# Patient Record
Sex: Male | Born: 1960 | Race: White | Hispanic: No | Marital: Married | Smoking: Former smoker
Health system: Southern US, Community
[De-identification: ages and names within clinical notes are randomized; demographics above are authoritative.]

## PROBLEM LIST (undated history)

## (undated) DIAGNOSIS — C439 Malignant melanoma of skin, unspecified: Principal | ICD-10-CM

## (undated) DIAGNOSIS — F17200 Nicotine dependence, unspecified, uncomplicated: Secondary | ICD-10-CM

## (undated) DIAGNOSIS — R7989 Other specified abnormal findings of blood chemistry: Secondary | ICD-10-CM

## (undated) DIAGNOSIS — F329 Major depressive disorder, single episode, unspecified: Secondary | ICD-10-CM

## (undated) DIAGNOSIS — F32A Depression, unspecified: Secondary | ICD-10-CM

## (undated) DIAGNOSIS — F419 Anxiety disorder, unspecified: Secondary | ICD-10-CM

## (undated) DIAGNOSIS — Z006 Encounter for examination for normal comparison and control in clinical research program: Secondary | ICD-10-CM

## (undated) DIAGNOSIS — G479 Sleep disorder, unspecified: Secondary | ICD-10-CM

## (undated) DIAGNOSIS — Z9289 Personal history of other medical treatment: Secondary | ICD-10-CM

## (undated) HISTORY — DX: Nicotine dependence, unspecified, uncomplicated: F17.200

## (undated) HISTORY — DX: Sleep disorder, unspecified: G47.9

## (undated) HISTORY — DX: Malignant melanoma of skin, unspecified: C43.9

## (undated) HISTORY — DX: Depression, unspecified: F32.A

## (undated) HISTORY — DX: Other specified abnormal findings of blood chemistry: R79.89

## (undated) HISTORY — DX: Anxiety disorder, unspecified: F41.9

## (undated) HISTORY — DX: Encounter for examination for normal comparison and control in clinical research program: Z00.6

## (undated) HISTORY — DX: Personal history of other medical treatment: Z92.89

## (undated) HISTORY — DX: Major depressive disorder, single episode, unspecified: F32.9

---

## 2004-07-12 ENCOUNTER — Emergency Department (HOSPITAL_COMMUNITY): Admission: EM | Admit: 2004-07-12 | Discharge: 2004-07-12 | Payer: Self-pay | Admitting: Emergency Medicine

## 2004-07-12 IMAGING — CR DG FOOT COMPLETE 3+V*L*
3 series · 3 of 3 positions shown · non-contrast
Comparison: none

CLINICAL DATA: dorsal left foot pain and swelling following an injury yesterday
 COMPLETE LEFT FOOT [DATE]
 Three views of the left foot demonstrate dorsal soft tissue swelling without fracture or dislocation.
 IMPRESSION
 No fracture.

[view not recorded (1 of 3)]
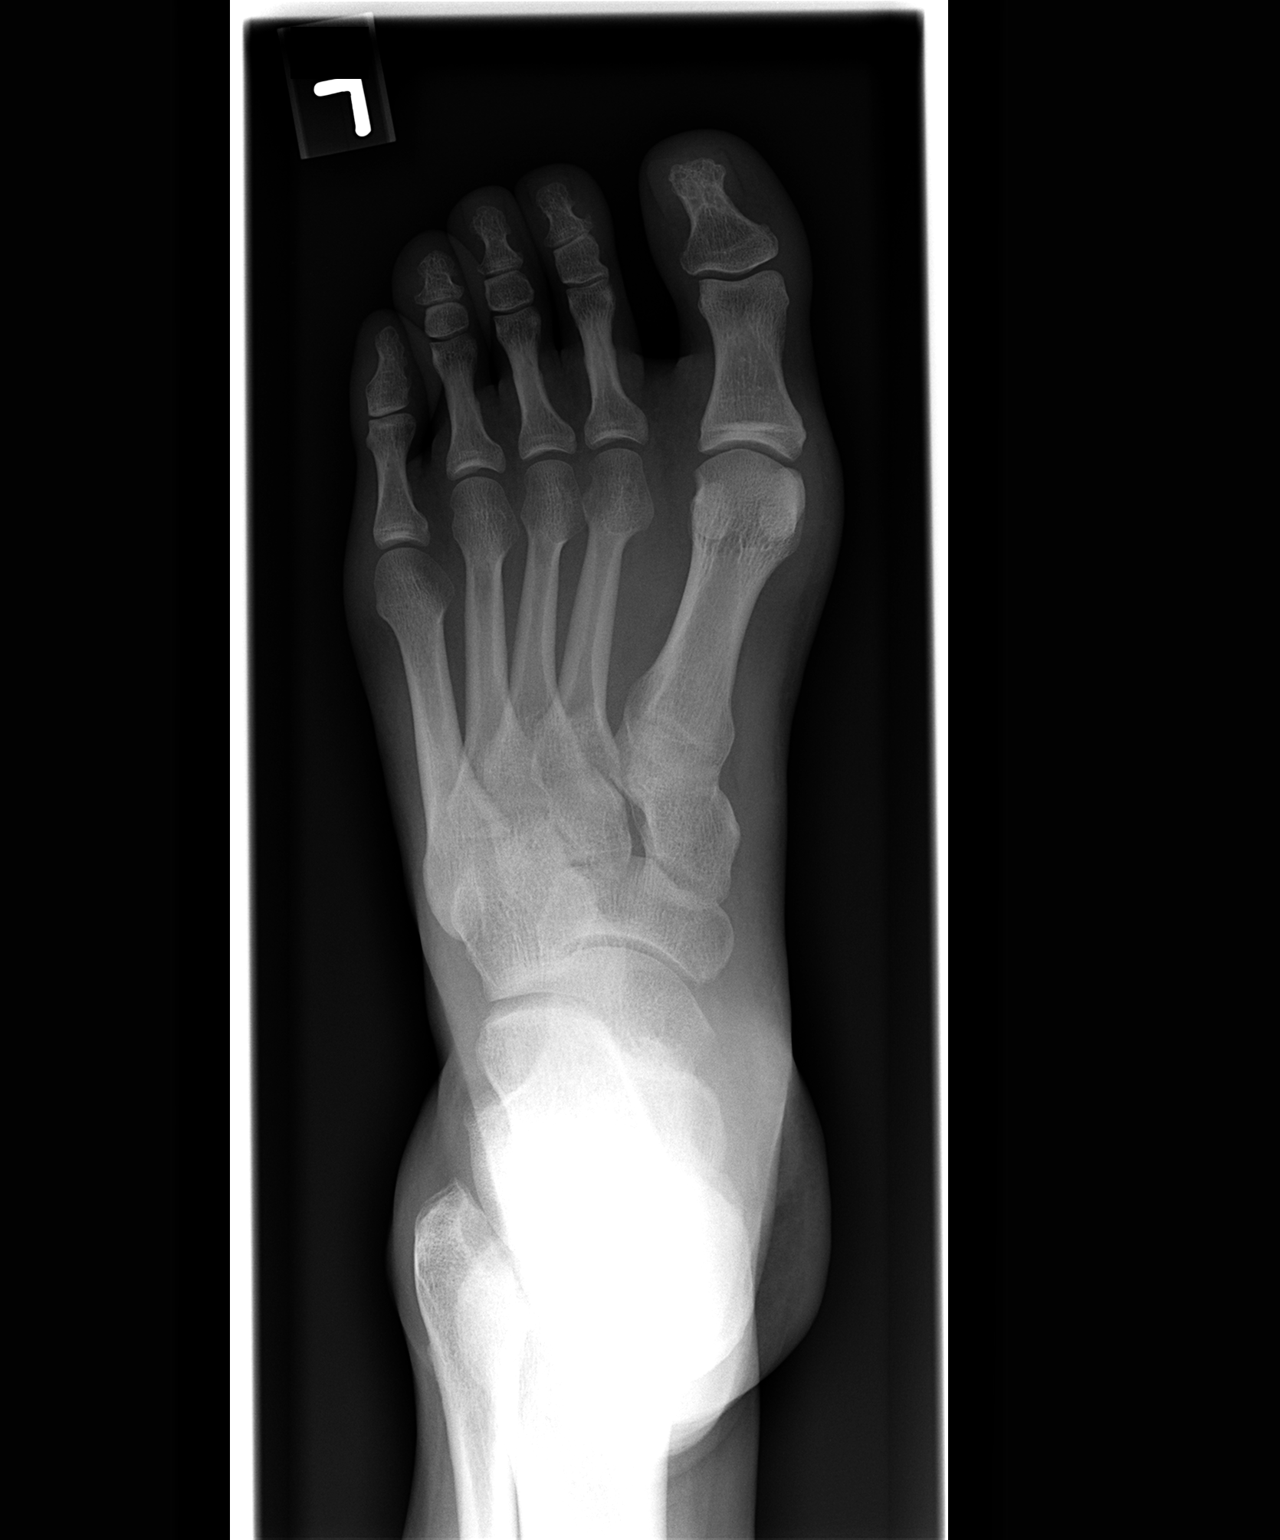

[view not recorded (2 of 3)]
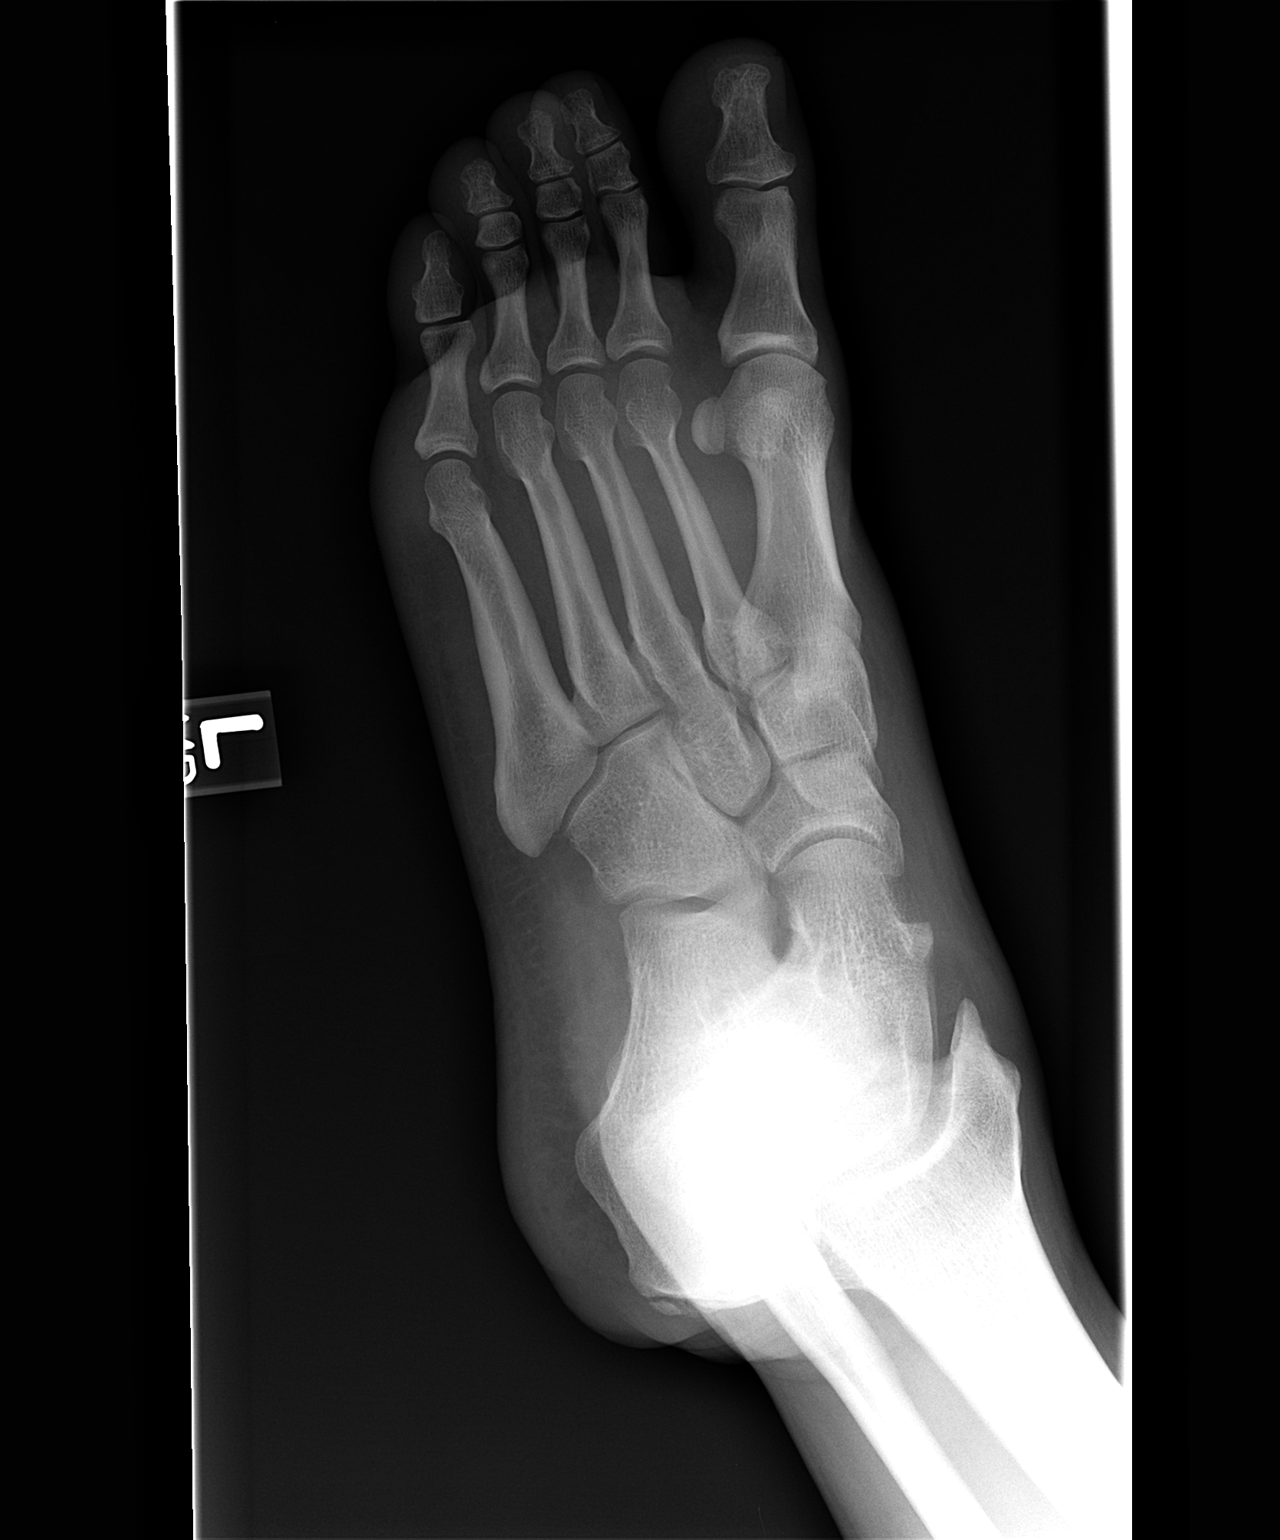

[view not recorded (3 of 3)]
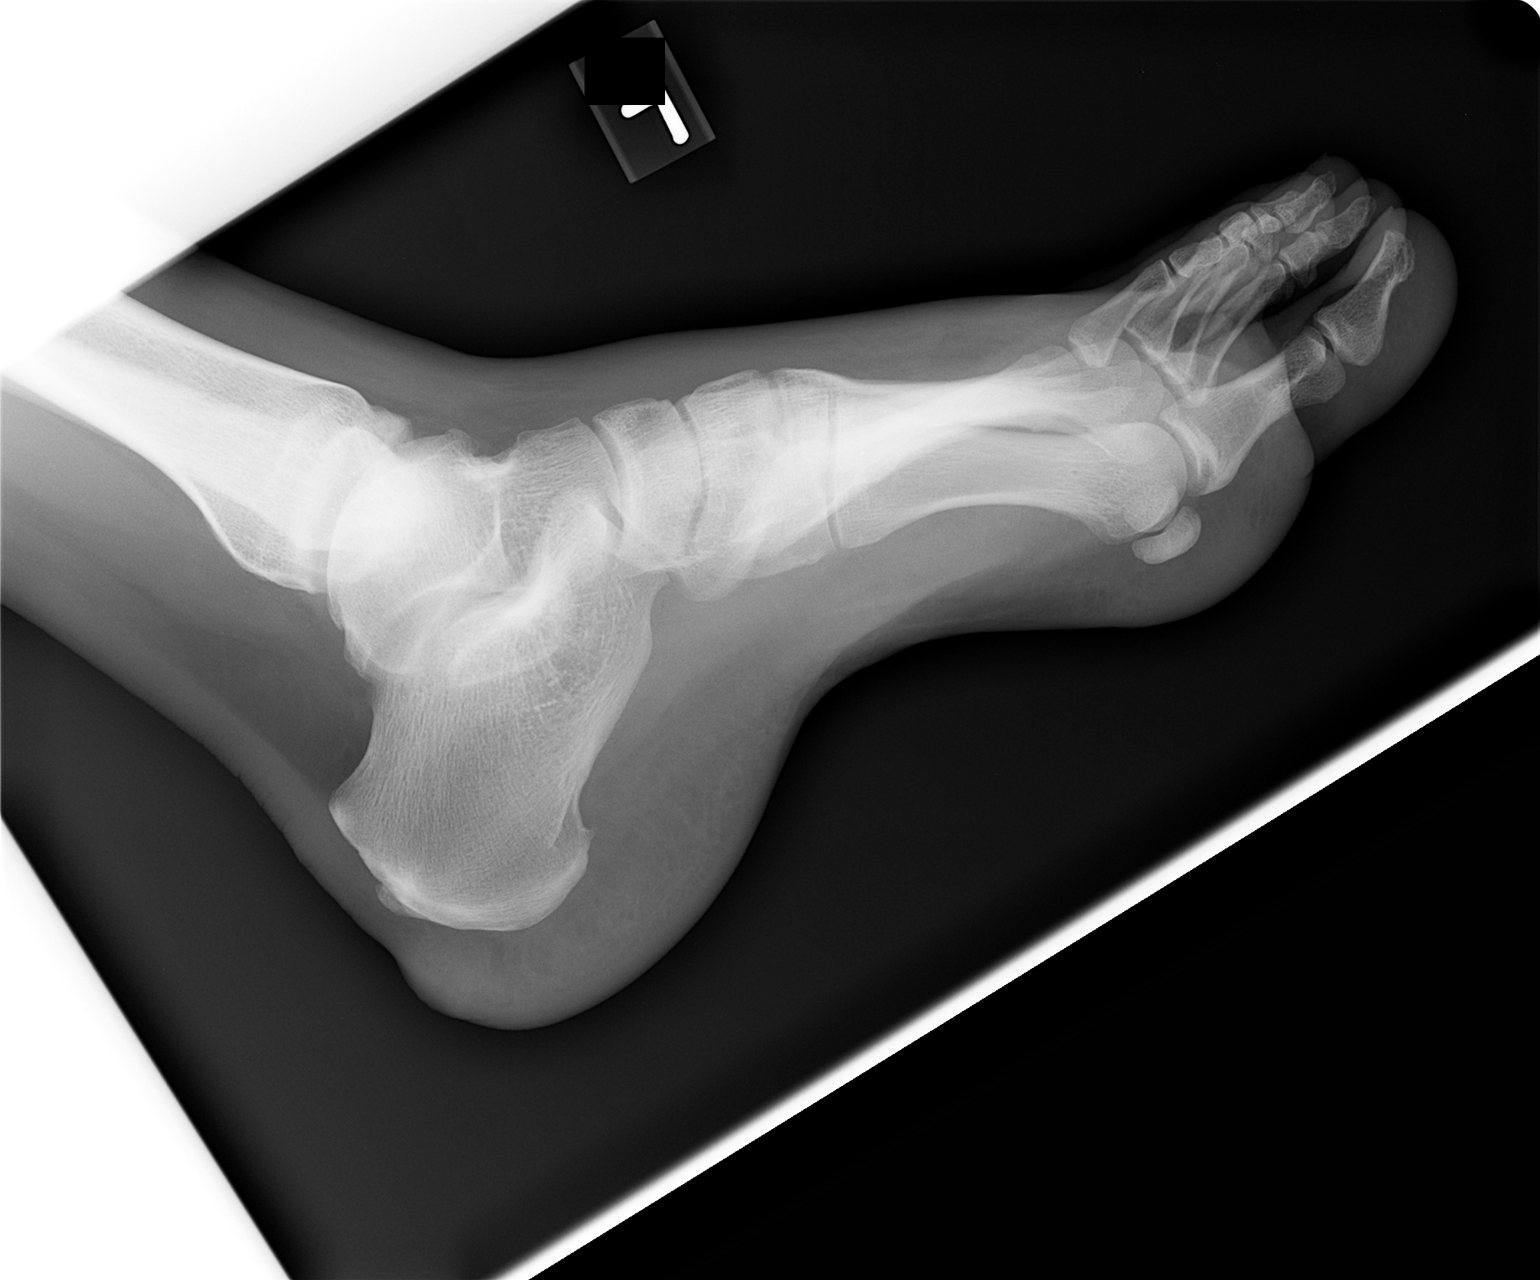

[3 of 3 positions shown; findings below may reference images not displayed]

## 2011-07-12 HISTORY — PX: LYMPH GLAND EXCISION: SHX13

## 2011-08-27 HISTORY — PX: OTHER SURGICAL HISTORY: SHX169

## 2011-08-27 HISTORY — PX: LYMPH NODE DISSECTION: SHX5087

## 2011-10-22 ENCOUNTER — Telehealth: Payer: Self-pay | Admitting: Hematology & Oncology

## 2011-10-22 NOTE — Telephone Encounter (Signed)
Left pt message his appointment will be on 11-6 at 9am. Asked him to call.

## 2011-10-25 ENCOUNTER — Telehealth: Payer: Self-pay | Admitting: Hematology & Oncology

## 2011-10-25 NOTE — Telephone Encounter (Signed)
mistake

## 2011-10-25 NOTE — Telephone Encounter (Signed)
Called Pt to see if he got my message from Friday. All numbers have been disconnected left message with Rebecca([phone]) [phone] and Baxter Hire [phone] from the chart sent from [facility]

## 2011-10-25 NOTE — Telephone Encounter (Signed)
Pt aware of 10-26-11 appointment's and times

## 2011-10-26 ENCOUNTER — Ambulatory Visit (HOSPITAL_BASED_OUTPATIENT_CLINIC_OR_DEPARTMENT_OTHER): Payer: 59 | Admitting: Family

## 2011-10-26 ENCOUNTER — Encounter: Payer: Self-pay | Admitting: Hematology & Oncology

## 2011-10-26 ENCOUNTER — Ambulatory Visit: Payer: Self-pay

## 2011-10-26 ENCOUNTER — Ambulatory Visit: Payer: 59

## 2011-10-26 ENCOUNTER — Other Ambulatory Visit: Payer: Self-pay | Admitting: Lab

## 2011-10-26 ENCOUNTER — Other Ambulatory Visit: Payer: Self-pay | Admitting: Hematology & Oncology

## 2011-10-26 ENCOUNTER — Encounter: Payer: Self-pay | Admitting: Family

## 2011-10-26 VITALS — BP 137/99 | HR 66 | Temp 98.3°F | Ht 70.25 in | Wt 192.1 lb

## 2011-10-26 DIAGNOSIS — C773 Secondary and unspecified malignant neoplasm of axilla and upper limb lymph nodes: Secondary | ICD-10-CM

## 2011-10-26 DIAGNOSIS — C439 Malignant melanoma of skin, unspecified: Secondary | ICD-10-CM

## 2011-10-26 DIAGNOSIS — C801 Malignant (primary) neoplasm, unspecified: Secondary | ICD-10-CM

## 2011-10-26 DIAGNOSIS — C4370 Malignant melanoma of unspecified lower limb, including hip: Secondary | ICD-10-CM

## 2011-10-26 DIAGNOSIS — C792 Secondary malignant neoplasm of skin: Secondary | ICD-10-CM

## 2011-10-26 DIAGNOSIS — F4321 Adjustment disorder with depressed mood: Secondary | ICD-10-CM

## 2011-10-26 HISTORY — DX: Malignant melanoma of skin, unspecified: C43.9

## 2011-10-26 MED ORDER — SODIUM CHLORIDE 0.9 % IV SOLN
Freq: Once | INTRAVENOUS | Status: AC
  Administered 2011-10-26: 11:00:00 via INTRAVENOUS

## 2011-10-26 MED ORDER — ALTEPLASE 2 MG IJ SOLR
2.0000 mg | Freq: Once | INTRAMUSCULAR | Status: AC | PRN
  Filled 2011-10-26: qty 2

## 2011-10-26 MED ORDER — SODIUM CHLORIDE 0.9 % IJ SOLN
3.0000 mL | INTRAMUSCULAR | Status: DC | PRN
  Filled 2011-10-26: qty 10

## 2011-10-26 MED ORDER — ONDANSETRON 8 MG/50ML IVPB (CHCC)
8.0000 mg | Freq: Once | INTRAVENOUS | Status: AC
  Administered 2011-10-26: 8 mg via INTRAVENOUS
  Filled 2011-10-26: qty 8

## 2011-10-26 MED ORDER — SODIUM CHLORIDE 0.9 % IJ SOLN
10.0000 mL | INTRAMUSCULAR | Status: DC | PRN
  Administered 2011-10-26: 10 mL
  Filled 2011-10-26: qty 10

## 2011-10-26 MED ORDER — SODIUM CHLORIDE 0.9 % IV SOLN
20.0000 10*6.[IU]/m2 | Freq: Once | INTRAVENOUS | Status: AC
  Administered 2011-10-26: 40 10*6.[IU] via INTRAVENOUS
  Filled 2011-10-26: qty 0.8

## 2011-10-26 MED ORDER — IBUPROFEN 200 MG PO TABS
400.0000 mg | ORAL_TABLET | Freq: Once | ORAL | Status: AC
  Administered 2011-10-26: 400 mg via ORAL

## 2011-10-26 MED ORDER — HEPARIN SOD (PORK) LOCK FLUSH 100 UNIT/ML IV SOLN
250.0000 [IU] | Freq: Once | INTRAVENOUS | Status: AC | PRN
  Administered 2011-10-26: 250 [IU]
  Filled 2011-10-26: qty 5

## 2011-10-26 MED ORDER — ACETAMINOPHEN 325 MG PO TABS
650.0000 mg | ORAL_TABLET | Freq: Once | ORAL | Status: AC
  Administered 2011-10-26: 650 mg via ORAL

## 2011-10-26 MED ORDER — INTERFERON ALFA-2B INJECTION 50 MILLION UNITS: 19.4000 10*6.[IU]/m2 | Freq: Once | INTRAMUSCULAR | Status: DC

## 2011-10-26 MED ORDER — SODIUM CHLORIDE 0.9 % IV SOLN
20.0000 10*6.[IU]/m2 | Freq: Every day | INTRAVENOUS | Status: DC
  Filled 2011-10-26: qty 0.82
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
## 2011-10-26 NOTE — Progress Notes (Signed)
This office note has been dictated. CSN [id number]

## 2011-10-27 ENCOUNTER — Encounter: Payer: Self-pay | Admitting: Hematology & Oncology

## 2011-10-27 ENCOUNTER — Ambulatory Visit (HOSPITAL_BASED_OUTPATIENT_CLINIC_OR_DEPARTMENT_OTHER): Payer: 59

## 2011-10-27 VITALS — BP 137/93 | HR 68 | Temp 97.2°F

## 2011-10-27 DIAGNOSIS — C439 Malignant melanoma of skin, unspecified: Secondary | ICD-10-CM

## 2011-10-27 DIAGNOSIS — C773 Secondary and unspecified malignant neoplasm of axilla and upper limb lymph nodes: Secondary | ICD-10-CM

## 2011-10-27 DIAGNOSIS — C801 Malignant (primary) neoplasm, unspecified: Secondary | ICD-10-CM

## 2011-10-27 DIAGNOSIS — C792 Secondary malignant neoplasm of skin: Secondary | ICD-10-CM

## 2011-10-27 DIAGNOSIS — Z5112 Encounter for antineoplastic immunotherapy: Secondary | ICD-10-CM

## 2011-10-27 MED ORDER — ONDANSETRON 8 MG/50ML IVPB (CHCC)
8.0000 mg | Freq: Once | INTRAVENOUS | Status: AC
  Administered 2011-10-27: 8 mg via INTRAVENOUS
  Filled 2011-10-27: qty 8

## 2011-10-27 MED ORDER — NAPROXEN 500 MG PO TABS
500.0000 mg | ORAL_TABLET | Freq: Once | ORAL | Status: AC
  Administered 2011-10-27: 500 mg via ORAL
  Filled 2011-10-27: qty 1

## 2011-10-27 MED ORDER — SODIUM CHLORIDE 0.9 % IV SOLN
20.0000 10*6.[IU]/m2 | Freq: Once | INTRAVENOUS | Status: AC
  Administered 2011-10-27: 40 10*6.[IU] via INTRAVENOUS
  Filled 2011-10-27: qty 0.8

## 2011-10-27 MED ORDER — SODIUM CHLORIDE 0.9 % IV SOLN
Freq: Once | INTRAVENOUS | Status: AC
  Administered 2011-10-27: 09:00:00 via INTRAVENOUS

## 2011-10-27 MED ORDER — HEPARIN SOD (PORK) LOCK FLUSH 100 UNIT/ML IV SOLN
250.0000 [IU] | Freq: Once | INTRAVENOUS | Status: AC | PRN
  Administered 2011-10-27: 250 [IU]
  Filled 2011-10-27: qty 5

## 2011-10-27 MED ORDER — SODIUM CHLORIDE 0.9 % IJ SOLN
10.0000 mL | INTRAMUSCULAR | Status: DC | PRN
  Administered 2011-10-27: 10 mL
  Filled 2011-10-27: qty 10

## 2011-10-27 MED ORDER — NAPROXEN 500 MG PO TABS: ORAL_TABLET | ORAL | Status: DC

## 2011-10-27 NOTE — Telephone Encounter (Signed)
Tresa Endo rucker encounter open error This encounter was created in error - please disregard.

## 2011-10-27 NOTE — Telephone Encounter (Signed)
Open encounter error This encounter was created in error - please disregard.

## 2011-10-28 ENCOUNTER — Ambulatory Visit (HOSPITAL_BASED_OUTPATIENT_CLINIC_OR_DEPARTMENT_OTHER): Payer: 59

## 2011-10-28 VITALS — BP 129/89 | HR 68 | Temp 98.0°F

## 2011-10-28 DIAGNOSIS — Z5112 Encounter for antineoplastic immunotherapy: Secondary | ICD-10-CM

## 2011-10-28 DIAGNOSIS — C439 Malignant melanoma of skin, unspecified: Secondary | ICD-10-CM

## 2011-10-28 DIAGNOSIS — C792 Secondary malignant neoplasm of skin: Secondary | ICD-10-CM

## 2011-10-28 MED ORDER — HEPARIN SOD (PORK) LOCK FLUSH 100 UNIT/ML IV SOLN
250.0000 [IU] | Freq: Once | INTRAVENOUS | Status: AC | PRN
  Administered 2011-10-28: 250 [IU]
  Filled 2011-10-28: qty 5

## 2011-10-28 MED ORDER — SODIUM CHLORIDE 0.9 % IV SOLN
Freq: Once | INTRAVENOUS | Status: AC
  Administered 2011-10-28: 09:00:00 via INTRAVENOUS

## 2011-10-28 MED ORDER — NAPROXEN 500 MG PO TABS: 500.0000 mg | ORAL_TABLET | Freq: Once | ORAL | Status: DC

## 2011-10-28 MED ORDER — NAPROXEN 250 MG PO TABS
500.0000 mg | ORAL_TABLET | Freq: Once | ORAL | Status: AC
  Administered 2011-10-28: 500 mg via ORAL
  Filled 2011-10-28: qty 2

## 2011-10-28 MED ORDER — ONDANSETRON 8 MG/50ML IVPB (CHCC)
8.0000 mg | Freq: Once | INTRAVENOUS | Status: AC
  Administered 2011-10-28: 8 mg via INTRAVENOUS
  Filled 2011-10-28: qty 8

## 2011-10-28 MED ORDER — ACETAMINOPHEN 325 MG PO TABS
650.0000 mg | ORAL_TABLET | Freq: Once | ORAL | Status: AC
  Administered 2011-10-28: 650 mg via ORAL

## 2011-10-28 MED ORDER — SODIUM CHLORIDE 0.9 % IJ SOLN
10.0000 mL | INTRAMUSCULAR | Status: DC | PRN
  Administered 2011-10-28: 10 mL
  Filled 2011-10-28: qty 10

## 2011-10-28 MED ORDER — SODIUM CHLORIDE 0.9 % IV SOLN
20.0000 10*6.[IU]/m2 | Freq: Once | INTRAVENOUS | Status: AC
  Administered 2011-10-28: 40 10*6.[IU] via INTRAVENOUS
  Filled 2011-10-28: qty 0.8

## 2011-10-28 NOTE — Progress Notes (Deleted)
DIAGNOSIS:  Encounter Diagnosis  Name Primary?  . Melanoma of skin, site unspecified Yes     HISTORY OF PRESENT ILLNESS:   PAST MEDICAL HISTORY:  Past Medical History  Diagnosis Date  . Depression   . Melanoma of skin, site unspecified 10/26/2011     ALLERGIES:  Allergies as of 10/26/2011  . (No Known Allergies)     MEDICATIONS:  Current Outpatient Prescriptions  Medication Sig Dispense Refill  . acetaminophen (TYLENOL) 325 MG tablet Take 650 mg by mouth every 4 (four) hours as needed. Takes at nightly on tx days       . ibuprofen (ADVIL,MOTRIN) 200 MG tablet Take 400 mg by mouth every 6 (six) hours as needed. Takes at [facility] if chills or fever       . oxyCODONE (OXY IR/ROXICODONE) 5 MG immediate release tablet       . oxyCODONE-acetaminophen (PERCOCET) 5-325 MG per tablet       . prochlorperazine (COMPAZINE) 10 MG tablet       . varenicline (CHANTIX) 1 MG tablet Take 1 mg by mouth 2 (two) times daily.         Current Facility-Administered Medications  Medication Dose Route Frequency Provider Last Rate Last Dose  . sodium chloride 0.9 % injection 10 mL  10 mL Intracatheter PRN Colman Cater, FNP   10 mL at 10/26/11 1250  . sodium chloride 0.9 % injection 3 mL  3 mL Intravenous PRN Colman Cater, FNP       Facility-Administered Medications Ordered in Other Visits  Medication Dose Route Frequency Provider Last Rate Last Dose  . 0.9 %  sodium chloride infusion   Intravenous Once Colman Cater, FNP      . acetaminophen (TYLENOL) tablet 650 mg  650 mg Oral Once Colman Cater, FNP   650 mg at 10/28/11 0905  . heparin lock flush 100 unit/mL  250 Units Intracatheter Once PRN Colman Cater, FNP   250 Units at 10/28/11 1033  . interferon alfa-2b (INTRON-A) [id number] UNITS 40 Million Units in sodium chloride 0.9 % 100 mL chemo infusion  20 Million Units/m2 (Order-Specific) Intravenous Once Colman Cater, FNP   40 Million Units at 10/28/11 0935  . naproxen (NAPROSYN) tablet 500 mg  500 mg  Oral Once Josph Macho, MD   500 mg at 10/28/11 0903  . ondansetron (ZOFRAN) IVPB 8 mg  8 mg Intravenous Once Colman Cater, FNP   8 mg at 10/28/11 0906  . DISCONTD: naproxen (NAPROSYN) tablet 500 mg  500 mg Oral Once Colman Cater, FNP      . DISCONTD: naproxen (NAPROSYN) tablet 500 mg  500 mg Oral Once Josph Macho, MD      . DISCONTD: sodium chloride 0.9 % injection 10 mL  10 mL Intracatheter PRN Colman Cater, FNP   10 mL at 10/27/11 1050  . DISCONTD: sodium chloride 0.9 % injection 10 mL  10 mL Intracatheter PRN Colman Cater, FNP   10 mL at 10/28/11 1032     SOCIAL HISTORY:  History   Social History  . Marital Status: Single    Spouse Name: N/A    Number of Children: N/A  . Years of Education: N/A   Occupational History  . Not on file.   Social History Main Topics  . Smoking status: Current Some Day Smoker -- 0.2 packs/day  . Smokeless tobacco: Never Used  . Alcohol Use: 3.6 oz/week    6 Shots of liquor per week  . Drug Use:  No  . Sexually Active:    Other Topics Concern  . Not on file   Social History Narrative  . No narrative on file     FAMILY HISTORY:  Family History  Problem Relation Age of Onset  . Cancer Father      REVIEW OF SYSTEMS: General ROS: {[id number]}   PHYSICAL EXAM: There were no vitals taken for this visit. GENERAL: Well developed, well nourished, in no acute distress.  EENT: No ocular or oral lesions. No stomatitis.  RESPIRATORY: Lungs are clear to auscultation bilaterally with normal respiratory movement and no accessory muscle use. CARDIAC: No murmur, rub or tachycardia. No upper or lower extremity edema.  GI: Abdomen is soft, no palpable hepatosplenomegaly. No fluid wave. No tenderness. Musculoskeletal: No kyphosis, no tenderness over the spine, ribs or hips. Lymph: No cervical, infraclavicular, axillary or inguinal adenopathy. Neuro: No focal neurological deficits. Psych: Alert and oriented X 3, appropriate mood and  affect.     LABORATORY STUDIES:  No results found for this basename: WBC, HGB, HCT, MCV, PLT   CMP  No results found for this basename: na, k, cl, co2, glucose, bun, creatinine, calcium, prot, albumin, ast, alt, alkphos, bilitot, gfrnonaa, gfraa   No results found for this basename: NEUTROABS        IMPRESSION:   PLAN:  DISCUSSION:

## 2011-10-29 ENCOUNTER — Other Ambulatory Visit: Payer: Self-pay | Admitting: Hematology & Oncology

## 2011-10-29 ENCOUNTER — Ambulatory Visit (HOSPITAL_BASED_OUTPATIENT_CLINIC_OR_DEPARTMENT_OTHER): Payer: 59

## 2011-10-29 VITALS — BP 135/72 | HR 71 | Temp 97.4°F

## 2011-10-29 DIAGNOSIS — C439 Malignant melanoma of skin, unspecified: Secondary | ICD-10-CM

## 2011-10-29 DIAGNOSIS — Z5112 Encounter for antineoplastic immunotherapy: Secondary | ICD-10-CM

## 2011-10-29 DIAGNOSIS — C792 Secondary malignant neoplasm of skin: Secondary | ICD-10-CM

## 2011-10-29 MED ORDER — PREDNISOLONE ACETATE 1 % OP SUSP: 1.0000 [drp] | Freq: Four times a day (QID) | OPHTHALMIC | Status: AC

## 2011-10-29 MED ORDER — ACETAMINOPHEN 325 MG PO TABS
650.0000 mg | ORAL_TABLET | Freq: Once | ORAL | Status: AC
  Administered 2011-10-29: 650 mg via ORAL

## 2011-10-29 MED ORDER — HEPARIN SOD (PORK) LOCK FLUSH 100 UNIT/ML IV SOLN
250.0000 [IU] | Freq: Once | INTRAVENOUS | Status: AC | PRN
  Administered 2011-10-29: 250 [IU]
  Filled 2011-10-29: qty 5

## 2011-10-29 MED ORDER — SODIUM CHLORIDE 0.9 % IV SOLN
20.0000 10*6.[IU]/m2 | Freq: Once | INTRAVENOUS | Status: AC
  Administered 2011-10-29: 40 10*6.[IU] via INTRAVENOUS
  Filled 2011-10-29: qty 0.8

## 2011-10-29 MED ORDER — NAPROXEN 500 MG PO TABS
500.0000 mg | ORAL_TABLET | Freq: Once | ORAL | Status: DC
  Administered 2011-10-29: 500 mg via ORAL

## 2011-10-29 MED ORDER — ONDANSETRON 8 MG/50ML IVPB (CHCC)
8.0000 mg | Freq: Once | INTRAVENOUS | Status: AC
  Administered 2011-10-29: 8 mg via INTRAVENOUS
  Filled 2011-10-29: qty 8

## 2011-10-29 MED ORDER — SODIUM CHLORIDE 0.9 % IJ SOLN
10.0000 mL | INTRAMUSCULAR | Status: DC | PRN
  Administered 2011-10-29: 10 mL
  Filled 2011-10-29: qty 10

## 2011-10-29 MED ORDER — NAPROXEN 500 MG PO TABS
500.0000 mg | ORAL_TABLET | Freq: Once | ORAL | Status: DC
  Filled 2011-10-29: qty 1

## 2011-10-29 MED ORDER — SODIUM CHLORIDE 0.9 % IV SOLN
Freq: Once | INTRAVENOUS | Status: AC
  Administered 2011-10-29: 09:00:00 via INTRAVENOUS

## 2011-10-29 MED FILL — Naproxen Tab 250 MG: ORAL | Qty: 2 | Status: AC

## 2011-11-01 ENCOUNTER — Other Ambulatory Visit: Payer: Self-pay | Admitting: Hematology & Oncology

## 2011-11-02 ENCOUNTER — Ambulatory Visit (HOSPITAL_BASED_OUTPATIENT_CLINIC_OR_DEPARTMENT_OTHER): Payer: 59

## 2011-11-02 VITALS — BP 126/90 | HR 76 | Temp 97.2°F

## 2011-11-02 DIAGNOSIS — Z5112 Encounter for antineoplastic immunotherapy: Secondary | ICD-10-CM

## 2011-11-02 DIAGNOSIS — C439 Malignant melanoma of skin, unspecified: Secondary | ICD-10-CM

## 2011-11-02 DIAGNOSIS — C792 Secondary malignant neoplasm of skin: Secondary | ICD-10-CM

## 2011-11-02 MED ORDER — SODIUM CHLORIDE 0.9 % IV SOLN
1000.0000 mL | Freq: Once | INTRAVENOUS | Status: AC
  Administered 2011-11-02: 1000 mL via INTRAVENOUS

## 2011-11-02 MED ORDER — SODIUM CHLORIDE 0.9 % IV SOLN
20.0000 10*6.[IU]/m2 | Freq: Once | INTRAVENOUS | Status: AC
  Administered 2011-11-02: 40 10*6.[IU] via INTRAVENOUS
  Filled 2011-11-02: qty 0.8

## 2011-11-02 MED ORDER — ACETAMINOPHEN 325 MG PO TABS
650.0000 mg | ORAL_TABLET | Freq: Once | ORAL | Status: AC
  Administered 2011-11-02: 650 mg via ORAL

## 2011-11-02 MED ORDER — SODIUM CHLORIDE 0.9 % IJ SOLN
10.0000 mL | INTRAMUSCULAR | Status: DC | PRN
  Administered 2011-11-02: 10 mL
  Filled 2011-11-02: qty 10

## 2011-11-02 MED ORDER — HEPARIN SOD (PORK) LOCK FLUSH 100 UNIT/ML IV SOLN
250.0000 [IU] | Freq: Once | INTRAVENOUS | Status: AC | PRN
  Administered 2011-11-02: 250 [IU]
  Filled 2011-11-02: qty 5

## 2011-11-02 MED ORDER — ONDANSETRON 8 MG/50ML IVPB (CHCC)
8.0000 mg | Freq: Once | INTRAVENOUS | Status: AC
  Administered 2011-11-02: 8 mg via INTRAVENOUS
  Filled 2011-11-02: qty 8

## 2011-11-02 MED ORDER — SODIUM CHLORIDE 0.9 % IJ SOLN
3.0000 mL | INTRAMUSCULAR | Status: DC | PRN
  Filled 2011-11-02: qty 10

## 2011-11-02 MED ORDER — NAPROXEN 500 MG PO TABS
500.0000 mg | ORAL_TABLET | Freq: Once | ORAL | Status: AC
  Administered 2011-11-02: 500 mg via ORAL
  Filled 2011-11-02: qty 1

## 2011-11-03 ENCOUNTER — Ambulatory Visit (HOSPITAL_BASED_OUTPATIENT_CLINIC_OR_DEPARTMENT_OTHER): Payer: 59 | Admitting: Hematology & Oncology

## 2011-11-03 ENCOUNTER — Ambulatory Visit (HOSPITAL_BASED_OUTPATIENT_CLINIC_OR_DEPARTMENT_OTHER): Payer: 59

## 2011-11-03 VITALS — BP 139/95 | HR 66 | Temp 97.4°F | Ht 70.25 in | Wt 191.0 lb

## 2011-11-03 DIAGNOSIS — C792 Secondary malignant neoplasm of skin: Secondary | ICD-10-CM

## 2011-11-03 DIAGNOSIS — Z5112 Encounter for antineoplastic immunotherapy: Secondary | ICD-10-CM

## 2011-11-03 DIAGNOSIS — C439 Malignant melanoma of skin, unspecified: Secondary | ICD-10-CM

## 2011-11-03 MED ORDER — ACETAMINOPHEN 325 MG PO TABS
650.0000 mg | ORAL_TABLET | Freq: Once | ORAL | Status: AC
  Administered 2011-11-03: 650 mg via ORAL

## 2011-11-03 MED ORDER — HEPARIN SOD (PORK) LOCK FLUSH 100 UNIT/ML IV SOLN
250.0000 [IU] | Freq: Once | INTRAVENOUS | Status: AC | PRN
  Administered 2011-11-03: 250 [IU]
  Filled 2011-11-03: qty 5

## 2011-11-03 MED ORDER — SODIUM CHLORIDE 0.9 % IJ SOLN
10.0000 mL | INTRAMUSCULAR | Status: DC | PRN
  Administered 2011-11-03: 10 mL
  Filled 2011-11-03: qty 10

## 2011-11-03 MED ORDER — SODIUM CHLORIDE 0.9 % IV SOLN
20.0000 10*6.[IU]/m2 | Freq: Once | INTRAVENOUS | Status: AC
  Administered 2011-11-03: 40 10*6.[IU] via INTRAVENOUS
  Filled 2011-11-03: qty 0.8

## 2011-11-03 MED ORDER — SODIUM CHLORIDE 0.9 % IV SOLN
Freq: Once | INTRAVENOUS | Status: AC
Start: 2011-11-03 — End: 2011-11-03
  Administered 2011-11-03: 14:00:00 via INTRAVENOUS

## 2011-11-03 MED ORDER — NAPROXEN 500 MG PO TABS
500.0000 mg | ORAL_TABLET | Freq: Once | ORAL | Status: AC
  Administered 2011-11-03: 500 mg via ORAL

## 2011-11-03 MED ORDER — ONDANSETRON 16 MG/50ML IVPB (CHCC)
8.0000 mg | Freq: Once | INTRAVENOUS | Status: AC
  Administered 2011-11-03: 8 mg via INTRAVENOUS
  Filled 2011-11-03: qty 16

## 2011-11-03 MED ORDER — SODIUM CHLORIDE 0.9 % IJ SOLN
3.0000 mL | INTRAMUSCULAR | Status: DC | PRN
  Administered 2011-11-03: 3 mL via INTRAVENOUS
  Filled 2011-11-03: qty 10

## 2011-11-03 NOTE — Progress Notes (Signed)
CC:   Christopher Salama, MD  DIAGNOSIS:  Stage IIIC melanoma, unknown primary.  CURRENT THERAPY:  The patient is on an ECOG protocol receiving standard interferon in the adjuvant setting.  INTERIM HISTORY:  Christopher Randall comes in for followup.  He initially saw Christopher Randall back on 10/28/2011.  At that point, he had been out at [facility].  He presented with right axillary adenopathy.  This is back in July.  He underwent biopsy.  He was found to have myeloma.  He was never found to have a primary.  He was placed on [name] protocol.  He was placed onto the ECOG-1609 trial. He received standard therapy with 4 weeks of high-dose interferon followed by 48 weeks of subcutaneous maintenance interferon.  He is doing well with the high-dose interferon.  He is still working.  He does get fatigued.  This is usually on a weekend.  He has had some low-grade temperatures.  He has had some occasional sweats.  There is some flushing.  He has had no diarrhea.  His stools may be a little loose.  There has been no leg swelling.  He is still healing from the axillary node dissection. He has had no dysphagia or odynophagia.  There has been no double vision or blurred vision.  He does state he has some occasional ocular irritation after the interferon.  I did put him on some prednisone eyedrops.  PHYSICAL EXAM:  General:  This is a well-developed, well-nourished white gentleman in no obvious distress.  Vital Signs:  97, pulse 66, respiratory rate 18, blood pressure 139/95.  Weight is 191.  Head and neck:  Shows a normocephalic, atraumatic skull.  There are no ocular or oral lesions.  There are no palpable cervical or supraclavicular lymph nodes.  Lungs:  Clear to percussion and auscultation bilaterally. Cardiac:  Regular rate and rhythm with a normal S1, S2.  There are no murmurs, rubs or bruits.  Abdomen:  Soft with good bowel sounds.  There is no palpable abdominal mass.  There is no palpable hepatosplenomegaly. Back:   No tenderness over the spine, ribs, or hips.  Extremities:  Shows no clubbing, cyanosis or edema.  Axillary:  Exam shows no obvious axillary adenopathy bilaterally.  He still has some tenderness in the right axilla.  Neurologic:  Shows no focal neurological deficits.  IMPRESSION:  Christopher Randall is a 50 year old gentleman with stage IIIC melanoma.  Again, the primary is unknown.  We are going to continue him on his protocol.  He is doing well with the high-dose interferon from my point of view.  He has his lab work done out at [facility] on Mondays.  We will plan to get him back to see [place] in another couple of weeks.    ______________________________ Josph Macho, M.D. PRE/MEDQ  D:  11/03/2011  T:  11/03/2011  Job:  [id number]

## 2011-11-03 NOTE — Progress Notes (Signed)
This office note has been dictated. Josph Macho CSN: [id number]

## 2011-11-04 ENCOUNTER — Ambulatory Visit (HOSPITAL_BASED_OUTPATIENT_CLINIC_OR_DEPARTMENT_OTHER): Payer: 59

## 2011-11-04 VITALS — BP 132/95 | HR 62 | Temp 97.1°F

## 2011-11-04 DIAGNOSIS — C801 Malignant (primary) neoplasm, unspecified: Secondary | ICD-10-CM

## 2011-11-04 DIAGNOSIS — C773 Secondary and unspecified malignant neoplasm of axilla and upper limb lymph nodes: Secondary | ICD-10-CM

## 2011-11-04 DIAGNOSIS — Z5112 Encounter for antineoplastic immunotherapy: Secondary | ICD-10-CM

## 2011-11-04 DIAGNOSIS — C439 Malignant melanoma of skin, unspecified: Secondary | ICD-10-CM

## 2011-11-04 MED ORDER — ONDANSETRON 8 MG/50ML IVPB (CHCC)
8.0000 mg | Freq: Once | INTRAVENOUS | Status: AC
  Administered 2011-11-04: 8 mg via INTRAVENOUS
  Filled 2011-11-04: qty 8

## 2011-11-04 MED ORDER — SODIUM CHLORIDE 0.9 % IJ SOLN
10.0000 mL | INTRAMUSCULAR | Status: DC | PRN
  Administered 2011-11-04: 10 mL
  Filled 2011-11-04: qty 10

## 2011-11-04 MED ORDER — SODIUM CHLORIDE 0.9 % IV SOLN
Freq: Once | INTRAVENOUS | Status: AC
  Administered 2011-11-04: 09:00:00 via INTRAVENOUS

## 2011-11-04 MED ORDER — NAPROXEN 500 MG PO TABS
500.0000 mg | ORAL_TABLET | Freq: Once | ORAL | Status: AC
  Administered 2011-11-04: 500 mg via ORAL
  Filled 2011-11-04: qty 1

## 2011-11-04 MED ORDER — ACETAMINOPHEN 325 MG PO TABS
650.0000 mg | ORAL_TABLET | Freq: Once | ORAL | Status: AC
  Administered 2011-11-04: 650 mg via ORAL

## 2011-11-04 MED ORDER — HEPARIN SOD (PORK) LOCK FLUSH 100 UNIT/ML IV SOLN
250.0000 [IU] | Freq: Once | INTRAVENOUS | Status: AC | PRN
  Administered 2011-11-04: 250 [IU]
  Filled 2011-11-04: qty 5

## 2011-11-04 MED ORDER — SODIUM CHLORIDE 0.9 % IV SOLN
20.0000 10*6.[IU]/m2 | Freq: Once | INTRAVENOUS | Status: AC
  Administered 2011-11-04: 40 10*6.[IU] via INTRAVENOUS
  Filled 2011-11-04: qty 0.8

## 2011-11-05 ENCOUNTER — Ambulatory Visit (HOSPITAL_BASED_OUTPATIENT_CLINIC_OR_DEPARTMENT_OTHER): Payer: 59

## 2011-11-05 VITALS — BP 134/94 | HR 61 | Temp 97.5°F

## 2011-11-05 DIAGNOSIS — C773 Secondary and unspecified malignant neoplasm of axilla and upper limb lymph nodes: Secondary | ICD-10-CM

## 2011-11-05 DIAGNOSIS — C801 Malignant (primary) neoplasm, unspecified: Secondary | ICD-10-CM

## 2011-11-05 DIAGNOSIS — Z5112 Encounter for antineoplastic immunotherapy: Secondary | ICD-10-CM

## 2011-11-05 DIAGNOSIS — C439 Malignant melanoma of skin, unspecified: Secondary | ICD-10-CM

## 2011-11-05 MED ORDER — NAPROXEN 250 MG PO TABS
500.0000 mg | ORAL_TABLET | Freq: Once | ORAL | Status: AC
  Administered 2011-11-05: 500 mg via ORAL
  Filled 2011-11-05: qty 2

## 2011-11-05 MED ORDER — SODIUM CHLORIDE 0.9 % IV SOLN
Freq: Once | INTRAVENOUS | Status: AC
  Administered 2011-11-05: 09:00:00 via INTRAVENOUS

## 2011-11-05 MED ORDER — ACETAMINOPHEN 325 MG PO TABS
650.0000 mg | ORAL_TABLET | Freq: Once | ORAL | Status: AC
  Administered 2011-11-05: 650 mg via ORAL

## 2011-11-05 MED ORDER — HEPARIN SOD (PORK) LOCK FLUSH 100 UNIT/ML IV SOLN
250.0000 [IU] | Freq: Once | INTRAVENOUS | Status: AC | PRN
  Administered 2011-11-05: 250 [IU]
  Filled 2011-11-05: qty 5

## 2011-11-05 MED ORDER — SODIUM CHLORIDE 0.9 % IJ SOLN
10.0000 mL | INTRAMUSCULAR | Status: DC | PRN
  Administered 2011-11-05: 10 mL
  Filled 2011-11-05: qty 10

## 2011-11-05 MED ORDER — SODIUM CHLORIDE 0.9 % IV SOLN
20.0000 10*6.[IU]/m2 | Freq: Once | INTRAVENOUS | Status: AC
  Administered 2011-11-05: 40 10*6.[IU] via INTRAVENOUS
  Filled 2011-11-05: qty 0.8

## 2011-11-05 MED ORDER — ONDANSETRON 8 MG/50ML IVPB (CHCC)
8.0000 mg | Freq: Once | INTRAVENOUS | Status: AC
  Administered 2011-11-05: 8 mg via INTRAVENOUS
  Filled 2011-11-05: qty 8

## 2011-11-09 ENCOUNTER — Ambulatory Visit (HOSPITAL_BASED_OUTPATIENT_CLINIC_OR_DEPARTMENT_OTHER): Payer: 59

## 2011-11-09 VITALS — BP 129/93 | HR 76 | Temp 97.2°F

## 2011-11-09 DIAGNOSIS — C801 Malignant (primary) neoplasm, unspecified: Secondary | ICD-10-CM

## 2011-11-09 DIAGNOSIS — C439 Malignant melanoma of skin, unspecified: Secondary | ICD-10-CM

## 2011-11-09 DIAGNOSIS — C773 Secondary and unspecified malignant neoplasm of axilla and upper limb lymph nodes: Secondary | ICD-10-CM

## 2011-11-09 DIAGNOSIS — Z5112 Encounter for antineoplastic immunotherapy: Secondary | ICD-10-CM

## 2011-11-09 MED ORDER — NAPROXEN 250 MG PO TABS
500.0000 mg | ORAL_TABLET | Freq: Once | ORAL | Status: AC
  Administered 2011-11-09: 500 mg via ORAL
  Filled 2011-11-09: qty 2

## 2011-11-09 MED ORDER — ALTEPLASE 2 MG IJ SOLR
2.0000 mg | Freq: Once | INTRAMUSCULAR | Status: DC | PRN
  Filled 2011-11-09: qty 2

## 2011-11-09 MED ORDER — INTERFERON ALFA-2B INJECTION 50 MILLION UNITS
20.0000 10*6.[IU]/m2 | Freq: Once | INTRAMUSCULAR | Status: AC
  Administered 2011-11-09: 40 10*6.[IU] via INTRAVENOUS
  Filled 2011-11-09: qty 0.8

## 2011-11-09 MED ORDER — HEPARIN SOD (PORK) LOCK FLUSH 100 UNIT/ML IV SOLN
250.0000 [IU] | Freq: Once | INTRAVENOUS | Status: AC | PRN
  Administered 2011-11-09: 250 [IU]
  Filled 2011-11-09: qty 5

## 2011-11-09 MED ORDER — ONDANSETRON 8 MG/50ML IVPB (CHCC)
8.0000 mg | Freq: Once | INTRAVENOUS | Status: AC
  Administered 2011-11-09: 8 mg via INTRAVENOUS
  Filled 2011-11-09: qty 8

## 2011-11-09 MED ORDER — ACETAMINOPHEN 325 MG PO TABS
650.0000 mg | ORAL_TABLET | Freq: Once | ORAL | Status: AC
  Administered 2011-11-09: 650 mg via ORAL

## 2011-11-09 MED ORDER — SODIUM CHLORIDE 0.9 % IJ SOLN
3.0000 mL | INTRAMUSCULAR | Status: DC | PRN
  Filled 2011-11-09: qty 10

## 2011-11-09 MED ORDER — SODIUM CHLORIDE 0.9 % IJ SOLN
10.0000 mL | INTRAMUSCULAR | Status: DC | PRN
  Administered 2011-11-09: 10 mL
  Filled 2011-11-09: qty 10

## 2011-11-09 MED ORDER — SODIUM CHLORIDE 0.9 % IV SOLN
Freq: Once | INTRAVENOUS | Status: AC
  Administered 2011-11-09: 09:00:00 via INTRAVENOUS

## 2011-11-10 ENCOUNTER — Ambulatory Visit (HOSPITAL_BASED_OUTPATIENT_CLINIC_OR_DEPARTMENT_OTHER): Payer: 59

## 2011-11-10 ENCOUNTER — Other Ambulatory Visit: Payer: Self-pay | Admitting: Hematology & Oncology

## 2011-11-10 VITALS — BP 132/90 | HR 67 | Temp 97.1°F

## 2011-11-10 DIAGNOSIS — C439 Malignant melanoma of skin, unspecified: Secondary | ICD-10-CM

## 2011-11-10 DIAGNOSIS — C773 Secondary and unspecified malignant neoplasm of axilla and upper limb lymph nodes: Secondary | ICD-10-CM

## 2011-11-10 DIAGNOSIS — Z5112 Encounter for antineoplastic immunotherapy: Secondary | ICD-10-CM

## 2011-11-10 DIAGNOSIS — C801 Malignant (primary) neoplasm, unspecified: Secondary | ICD-10-CM

## 2011-11-10 MED ORDER — ONDANSETRON 8 MG/50ML IVPB (CHCC)
8.0000 mg | Freq: Once | INTRAVENOUS | Status: AC
  Administered 2011-11-10: 8 mg via INTRAVENOUS
  Filled 2011-11-10: qty 8

## 2011-11-10 MED ORDER — SODIUM CHLORIDE 0.9 % IJ SOLN
10.0000 mL | INTRAMUSCULAR | Status: DC | PRN
  Administered 2011-11-10: 10 mL
  Filled 2011-11-10: qty 10

## 2011-11-10 MED ORDER — NAPROXEN 250 MG PO TABS
500.0000 mg | ORAL_TABLET | Freq: Once | ORAL | Status: AC
  Administered 2011-11-10: 500 mg via ORAL
  Filled 2011-11-10: qty 2

## 2011-11-10 MED ORDER — SODIUM CHLORIDE 0.9 % IV SOLN
20.0000 10*6.[IU]/m2 | Freq: Once | INTRAVENOUS | Status: AC
  Administered 2011-11-10: 40 10*6.[IU] via INTRAVENOUS
  Filled 2011-11-10: qty 0.8

## 2011-11-10 MED ORDER — ACETAMINOPHEN 325 MG PO TABS
650.0000 mg | ORAL_TABLET | Freq: Once | ORAL | Status: AC
  Administered 2011-11-10: 650 mg via ORAL

## 2011-11-10 MED ORDER — SODIUM CHLORIDE 0.9 % IJ SOLN
3.0000 mL | INTRAMUSCULAR | Status: DC | PRN
  Filled 2011-11-10: qty 10

## 2011-11-10 MED ORDER — SODIUM CHLORIDE 0.9 % IV SOLN
Freq: Once | INTRAVENOUS | Status: AC
  Administered 2011-11-10: 09:00:00 via INTRAVENOUS

## 2011-11-10 MED ORDER — HEPARIN SOD (PORK) LOCK FLUSH 100 UNIT/ML IV SOLN
250.0000 [IU] | Freq: Once | INTRAVENOUS | Status: AC | PRN
  Administered 2011-11-10: 250 [IU]
  Filled 2011-11-10: qty 5

## 2011-11-12 ENCOUNTER — Ambulatory Visit (HOSPITAL_BASED_OUTPATIENT_CLINIC_OR_DEPARTMENT_OTHER): Payer: 59

## 2011-11-12 VITALS — BP 130/94 | HR 68 | Temp 97.5°F

## 2011-11-12 DIAGNOSIS — C773 Secondary and unspecified malignant neoplasm of axilla and upper limb lymph nodes: Secondary | ICD-10-CM

## 2011-11-12 DIAGNOSIS — C801 Malignant (primary) neoplasm, unspecified: Secondary | ICD-10-CM

## 2011-11-12 DIAGNOSIS — Z5111 Encounter for antineoplastic chemotherapy: Secondary | ICD-10-CM

## 2011-11-12 DIAGNOSIS — C439 Malignant melanoma of skin, unspecified: Secondary | ICD-10-CM

## 2011-11-12 MED ORDER — SODIUM CHLORIDE 0.9 % IJ SOLN
10.0000 mL | INTRAMUSCULAR | Status: DC | PRN
  Administered 2011-11-12: 10 mL
  Filled 2011-11-12: qty 10

## 2011-11-12 MED ORDER — SODIUM CHLORIDE 0.9 % IV SOLN
20.0000 10*6.[IU]/m2 | Freq: Once | INTRAVENOUS | Status: AC
  Administered 2011-11-12: 40 10*6.[IU] via INTRAVENOUS
  Filled 2011-11-12: qty 0.8

## 2011-11-12 MED ORDER — SODIUM CHLORIDE 0.9 % IV SOLN
Freq: Once | INTRAVENOUS | Status: AC
  Administered 2011-11-12: 08:00:00 via INTRAVENOUS

## 2011-11-12 MED ORDER — ONDANSETRON 8 MG/50ML IVPB (CHCC)
8.0000 mg | Freq: Once | INTRAVENOUS | Status: AC
  Administered 2011-11-12: 8 mg via INTRAVENOUS
  Filled 2011-11-12: qty 8

## 2011-11-12 MED ORDER — HEPARIN SOD (PORK) LOCK FLUSH 100 UNIT/ML IV SOLN
250.0000 [IU] | Freq: Once | INTRAVENOUS | Status: AC | PRN
  Administered 2011-11-12: 250 [IU]
  Filled 2011-11-12: qty 5

## 2011-11-12 MED ORDER — NAPROXEN 250 MG PO TABS
500.0000 mg | ORAL_TABLET | Freq: Once | ORAL | Status: AC
  Administered 2011-11-12: 500 mg via ORAL
  Filled 2011-11-12: qty 2

## 2011-11-12 MED ORDER — ACETAMINOPHEN 325 MG PO TABS
650.0000 mg | ORAL_TABLET | Freq: Once | ORAL | Status: AC
  Administered 2011-11-12: 650 mg via ORAL

## 2011-11-16 ENCOUNTER — Ambulatory Visit: Payer: 59

## 2011-11-17 ENCOUNTER — Ambulatory Visit: Payer: 59

## 2011-11-18 ENCOUNTER — Ambulatory Visit: Payer: 59

## 2011-11-19 ENCOUNTER — Ambulatory Visit: Payer: 59

## 2011-11-23 ENCOUNTER — Ambulatory Visit: Payer: 59

## 2011-11-24 ENCOUNTER — Ambulatory Visit: Payer: 59 | Admitting: Family

## 2011-11-24 ENCOUNTER — Ambulatory Visit: Payer: 59

## 2011-11-24 ENCOUNTER — Other Ambulatory Visit: Payer: 59 | Admitting: Lab

## 2011-11-25 ENCOUNTER — Ambulatory Visit: Payer: 59

## 2011-11-26 ENCOUNTER — Ambulatory Visit: Payer: 59

## 2012-07-03 DIAGNOSIS — F419 Anxiety disorder, unspecified: Secondary | ICD-10-CM

## 2012-07-03 HISTORY — DX: Anxiety disorder, unspecified: F41.9

## 2012-09-11 DIAGNOSIS — Z006 Encounter for examination for normal comparison and control in clinical research program: Secondary | ICD-10-CM | POA: Insufficient documentation

## 2012-09-11 HISTORY — DX: Encounter for examination for normal comparison and control in clinical research program: Z00.6

## 2014-12-05 ENCOUNTER — Other Ambulatory Visit: Payer: Self-pay | Admitting: Nurse Practitioner

## 2015-07-17 DIAGNOSIS — F17200 Nicotine dependence, unspecified, uncomplicated: Secondary | ICD-10-CM | POA: Insufficient documentation

## 2015-07-17 HISTORY — DX: Nicotine dependence, unspecified, uncomplicated: F17.200

## 2016-02-23 DIAGNOSIS — Z9289 Personal history of other medical treatment: Secondary | ICD-10-CM | POA: Insufficient documentation

## 2016-02-23 HISTORY — DX: Personal history of other medical treatment: Z92.89

## 2017-09-28 ENCOUNTER — Ambulatory Visit (INDEPENDENT_AMBULATORY_CARE_PROVIDER_SITE_OTHER): Payer: Managed Care, Other (non HMO) | Admitting: Family Medicine

## 2017-09-28 ENCOUNTER — Encounter: Payer: Self-pay | Admitting: Family Medicine

## 2017-09-28 VITALS — BP 142/78 | HR 82 | Temp 98.3°F | Ht 69.5 in | Wt 184.8 lb

## 2017-09-28 DIAGNOSIS — F17210 Nicotine dependence, cigarettes, uncomplicated: Secondary | ICD-10-CM

## 2017-09-28 DIAGNOSIS — M25521 Pain in right elbow: Secondary | ICD-10-CM | POA: Insufficient documentation

## 2017-09-28 DIAGNOSIS — M25562 Pain in left knee: Secondary | ICD-10-CM | POA: Diagnosis not present

## 2017-09-28 DIAGNOSIS — G479 Sleep disorder, unspecified: Secondary | ICD-10-CM

## 2017-09-28 DIAGNOSIS — F419 Anxiety disorder, unspecified: Secondary | ICD-10-CM

## 2017-09-28 DIAGNOSIS — Z23 Encounter for immunization: Secondary | ICD-10-CM | POA: Diagnosis not present

## 2017-09-28 DIAGNOSIS — G8929 Other chronic pain: Secondary | ICD-10-CM

## 2017-09-28 DIAGNOSIS — R7989 Other specified abnormal findings of blood chemistry: Secondary | ICD-10-CM | POA: Insufficient documentation

## 2017-09-28 DIAGNOSIS — G4700 Insomnia, unspecified: Secondary | ICD-10-CM | POA: Insufficient documentation

## 2017-09-28 HISTORY — DX: Sleep disorder, unspecified: G47.9

## 2017-09-28 HISTORY — DX: Other specified abnormal findings of blood chemistry: R79.89

## 2017-09-28 LAB — TSH: TSH: 0.74 u[IU]/mL (ref 0.35–4.50)

## 2017-09-28 LAB — T4, FREE: Free T4: 0.89 ng/dL (ref 0.60–1.60)

## 2017-09-28 MED ORDER — VARENICLINE TARTRATE 0.5 MG X 11 & 1 MG X 42 PO MISC: ORAL | 0 refills | Status: DC

## 2017-09-28 MED ORDER — LORAZEPAM 1 MG PO TABS: 1.0000 mg | ORAL_TABLET | Freq: Three times a day (TID) | ORAL | 1 refills | Status: DC | PRN

## 2017-09-28 NOTE — Progress Notes (Signed)
Christopher Randall is a 56 y.o. male is here to [facility].   No care team member to display   History of Present Illness:   Water quality scientist, CMA, acting as scribe for Dr. Juleen China.  HPI:  Patient comes in today to establish care.    Patient is interested in quitting smoking.  He would like to start therapy with Chantix.  Will start Chantix and also give other resources to help patient quit smoking.  Patient states he has difficulty sleeping.  He states he does not have difficulty falling asleep but will have strange dreams and wakes up startled.  Once he wakes up he will sometimes have difficulty falling back to sleep.  He has tried lorazepam in the past and states it helps somewhat.  He has also tried drinking a couple of alcoholic drinks before bed but wonders if this "gets in his way" of his sleep.  Patient has been told that he snores.  He has never had a sleep study.  Will order sleep study for the patient.  Will write for Ativan as well and have advised patient to abstain from alcohol if he is taking Ativan for sleep.  Patient is complaining of pain in his right elbow.  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  He now has pain in his right elbow and is unable to straighten his arm all the way without pain.  He also does a lot of mountain biking and states he has left knee pain due to a meniscus injury.  Patient is followed at [facility] for history of malignant melanoma.  He had recent abnormal thyroid labs.  Will recheck labs today.    Health Maintenance Due  Topic Date Due  . Hepatitis C Screening  [birth year]  . HIV Screening  01/12/1977  . TETANUS/TDAP  01/12/1981  . COLONOSCOPY  01/13/2012   Depression screen PHQ 2/9 09/28/2017  Decreased Interest 0  Down, Depressed, Hopeless 0  PHQ - 2 Score 0   PMHx, SurgHx, SocialHx, Medications, and Allergies were reviewed in the Visit Navigator and updated as appropriate.   Past Medical History:    Diagnosis Date  . Abnormal TSH 09/28/2017  . Anxiety 07/03/2012  . Depression   . Difficulty sleeping 09/28/2017  . Examination of participant in clinical trial 09/11/2012  . History of immunotherapy 02/23/2016  . Melanoma of skin 10/26/2011  . Nicotine dependence 07/17/2015  . Smoker    Past Surgical History:  Procedure Laterality Date  . LEFT POPLITEAL MASS RESECTION  08/27/2011   pigmented villonodular synovitis, negative for malignancy  . LYMPH GLAND EXCISION  07/12/2011   right axilla, consistent with melanoma  . LYMPH NODE DISSECTION  08/27/2011   right, 0/17 nodes positive   Family History  Problem Relation Age of Onset  . Hypertension Mother   . CAD Mother   . Lung cancer Father    Social History  Substance Use Topics  . Smoking status: Current Every Day Smoker    Packs/day: 0.25  . Smokeless tobacco: Never Used  . Alcohol use 3.6 oz/week    6 Shots of liquor per week   Current Medications and Allergies:   .  None  Allergies  Allergen Reactions  . Iopamidol Hives and Itching    Isovue break through with prep, Hives and Itching  Patient had PET?CT scan with 125 mls of Isovue-300. Noted to have two raised itchy hives which self-resolved. Patient had  PET/CT scan with contrast on 05/27/2014 while on prednisone prep (3 doses of 50 mg-13 hour prep) and noted to have two hives which self -resolved without treatment  . Iodinated Diagnostic Agents Rash  . Iodine-131 Rash   Review of Systems:   Pertinent items are noted in the HPI. Otherwise, ROS is negative.  Vitals:   Vitals:   09/28/17 0921  BP: (!) 142/78  Pulse: 82  Temp: 98.3 F (36.8 C)  TempSrc: Oral  SpO2: 97%  Weight: 184 lb 12.8 oz (83.8 kg)  Height: 5' 9.5" (1.765 m)     Body mass index is 26.9 kg/m.   Physical Exam:   Physical Exam  Constitutional: He is oriented to person, place, and time. He appears well-developed and well-nourished. No distress.  HENT:  Head: Normocephalic and  atraumatic.  Right Ear: External ear normal.  Left Ear: External ear normal.  Nose: Nose normal.  Mouth/Throat: Oropharynx is clear and moist.  Eyes: Pupils are equal, round, and reactive to light. Conjunctivae and EOM are normal.  Neck: Normal range of motion. Neck supple.  Cardiovascular: Normal rate, regular rhythm, normal heart sounds and intact distal pulses.   Pulmonary/Chest: Effort normal and breath sounds normal.  Abdominal: Soft. Bowel sounds are normal.  Musculoskeletal: Normal range of motion.  Neurological: He is alert and oriented to person, place, and time.  Skin: Skin is warm and dry.  Psychiatric: He has a normal mood and affect. His behavior is normal. Judgment and thought content normal.  Nursing note and vitals reviewed.  Results for orders placed or performed in visit on 09/28/17  TSH  Result Value Ref Range   TSH 0.74 0.35 - 4.50 uIU/mL  T4, free  Result Value Ref Range   Free T4 0.89 0.60 - 1.60 ng/dL   Assessment and Plan:   [name] was seen today for establish care.  Diagnoses and all orders for this visit: SEE HPI FOR AP OF BELOW AS WELL.  Abnormal TSH -     TSH -     T4, free  Anxiety -     LORazepam (ATIVAN) 1 MG tablet; Take 1 tablet (1 mg total) by mouth 3 (three) times daily as needed for anxiety.  Cigarette nicotine dependence without complication Comments: IThe patient was counseled on the dangers of tobacco use, and was advised to quit.  Reviewed strategies to maximize success, including removing cigarettes and smoking materials from environment, stress management, support of family/friends, written materials, local smoking cessation programs (1-800-QUIT-NOW and SMOKEFREE.GOV) and pharmacotherapy (SEE BELOW). Greater than (3) minutes were spent on counseling today. Orders: -     varenicline (CHANTIX STARTING MONTH PAK) 0.5 MG X 11 & 1 MG X 42 tablet; Take one 0.5 mg tablet by mouth once daily for 3 days, then increase to one 0.5 mg tablet twice  daily for 4 days, then increase to one 1 mg tablet twice daily.  Difficulty sleeping -     Home sleep test; Future  Right elbow pain -     Ambulatory referral to Sports Medicine  Chronic pain of left knee -     Ambulatory referral to Sports Medicine  Need for immunization against influenza -     Flu Vaccine QUAD 36+ mos IM   . Reviewed expectations re: course of current medical issues. . Discussed self-management of symptoms. . Outlined signs and symptoms indicating need for more acute intervention. . Patient verbalized understanding and all questions were answered. Marland Kitchen Health Maintenance issues including  appropriate healthy diet, exercise, and smoking avoidance were discussed with patient. . See orders for this visit as documented in the electronic medical record. . Patient received an After Visit Summary.  CMA served as Education administrator during this visit. History, Physical, and Plan performed by medical provider. The above documentation has been reviewed and is accurate and complete. Briscoe Deutscher, D.O.  Briscoe Deutscher, DO [place] [place] 10/02/2017  Future Appointments Date Time Provider [facility]  10/03/2017 10:20 AM Gerda Diss, DO [facility] None

## 2017-10-02 ENCOUNTER — Encounter: Payer: Self-pay | Admitting: Family Medicine

## 2017-10-03 ENCOUNTER — Ambulatory Visit (INDEPENDENT_AMBULATORY_CARE_PROVIDER_SITE_OTHER): Payer: Managed Care, Other (non HMO)

## 2017-10-03 ENCOUNTER — Other Ambulatory Visit: Payer: Self-pay

## 2017-10-03 ENCOUNTER — Ambulatory Visit: Payer: Self-pay

## 2017-10-03 ENCOUNTER — Ambulatory Visit (INDEPENDENT_AMBULATORY_CARE_PROVIDER_SITE_OTHER): Payer: Managed Care, Other (non HMO) | Admitting: Sports Medicine

## 2017-10-03 ENCOUNTER — Encounter: Payer: Self-pay | Admitting: Sports Medicine

## 2017-10-03 VITALS — BP 140/88 | HR 80 | Ht 69.0 in | Wt 184.4 lb

## 2017-10-03 DIAGNOSIS — G8929 Other chronic pain: Secondary | ICD-10-CM | POA: Diagnosis not present

## 2017-10-03 DIAGNOSIS — M25521 Pain in right elbow: Secondary | ICD-10-CM

## 2017-10-03 DIAGNOSIS — M25562 Pain in left knee: Secondary | ICD-10-CM | POA: Diagnosis not present

## 2017-10-03 IMAGING — DX DG ELBOW COMPLETE 3+V*R*
4 series · 4 of 4 positions shown · non-contrast
Comparison: None.

CLINICAL DATA: 56-year-old male with elbow pain after lifting heavy
objects 6-8 weeks ago. Pain worse recently. Initial encounter.

EXAM:
RIGHT ELBOW - COMPLETE 3+ VIEW

[elbow ap]
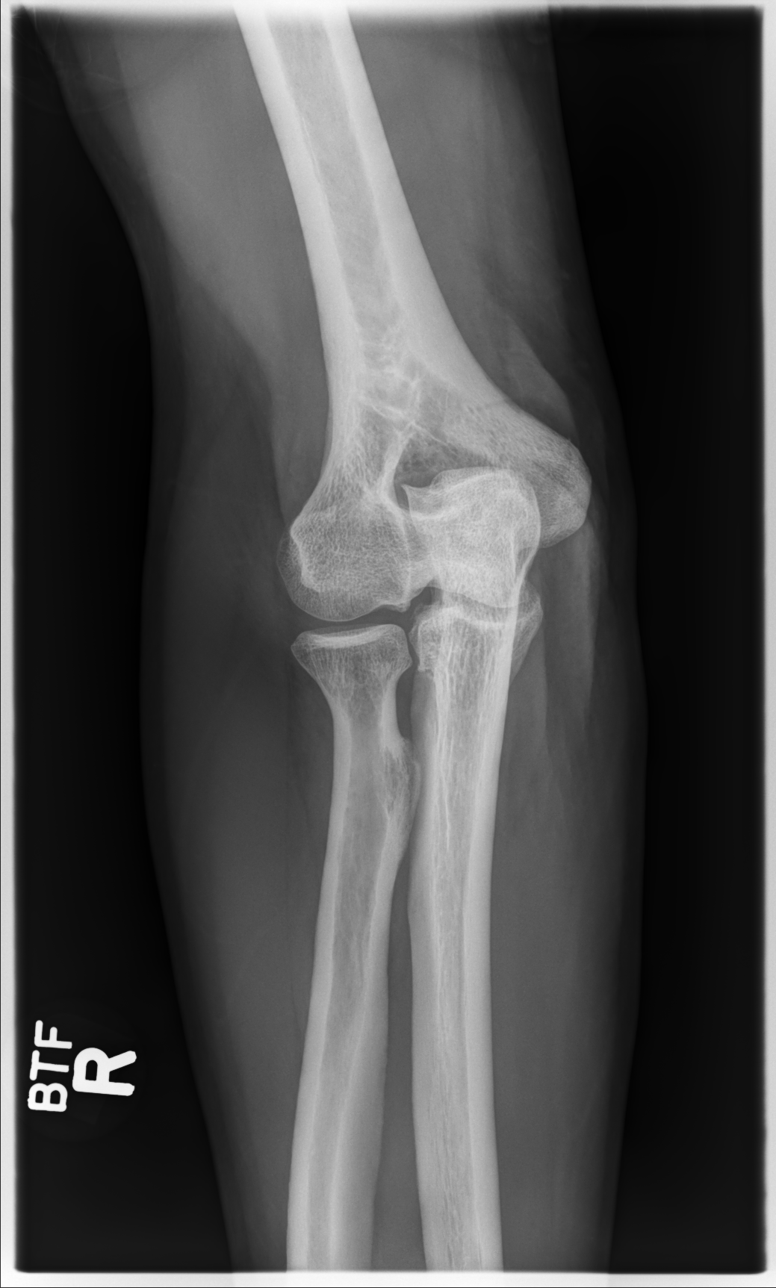

[elbow medial oblique]
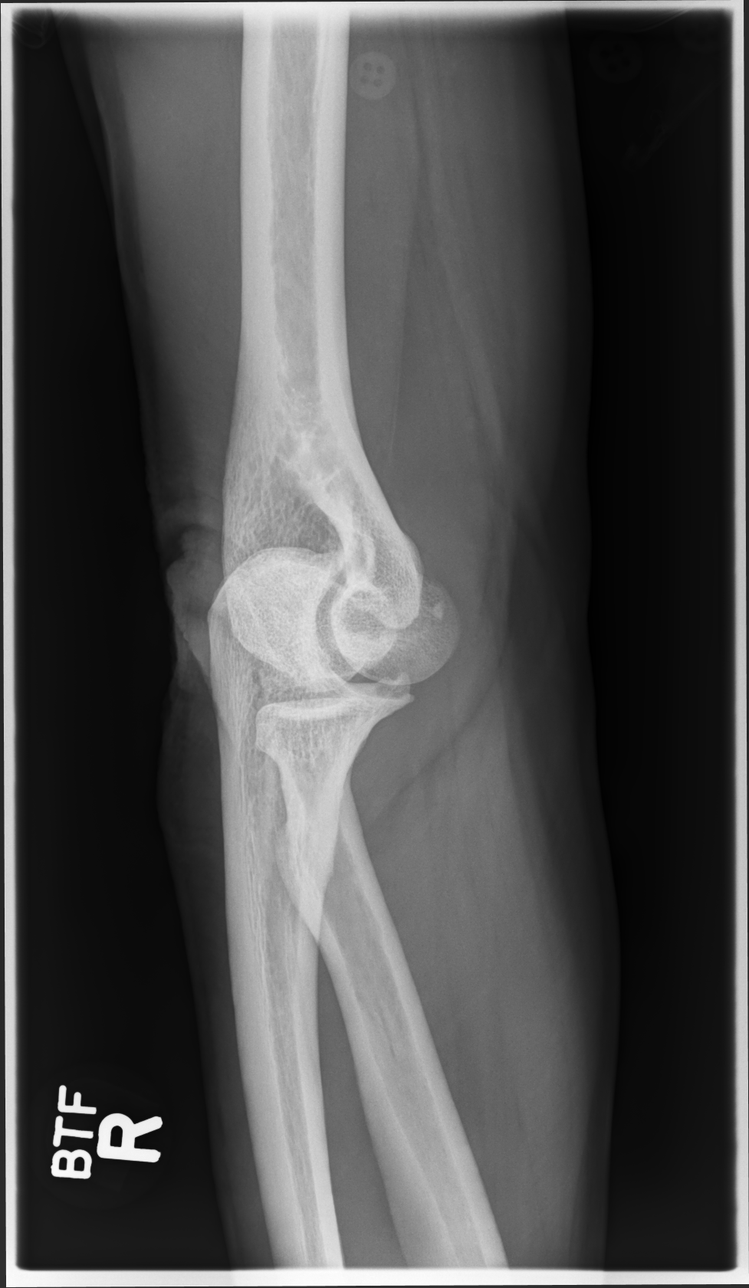

[elbow lateral oblique]
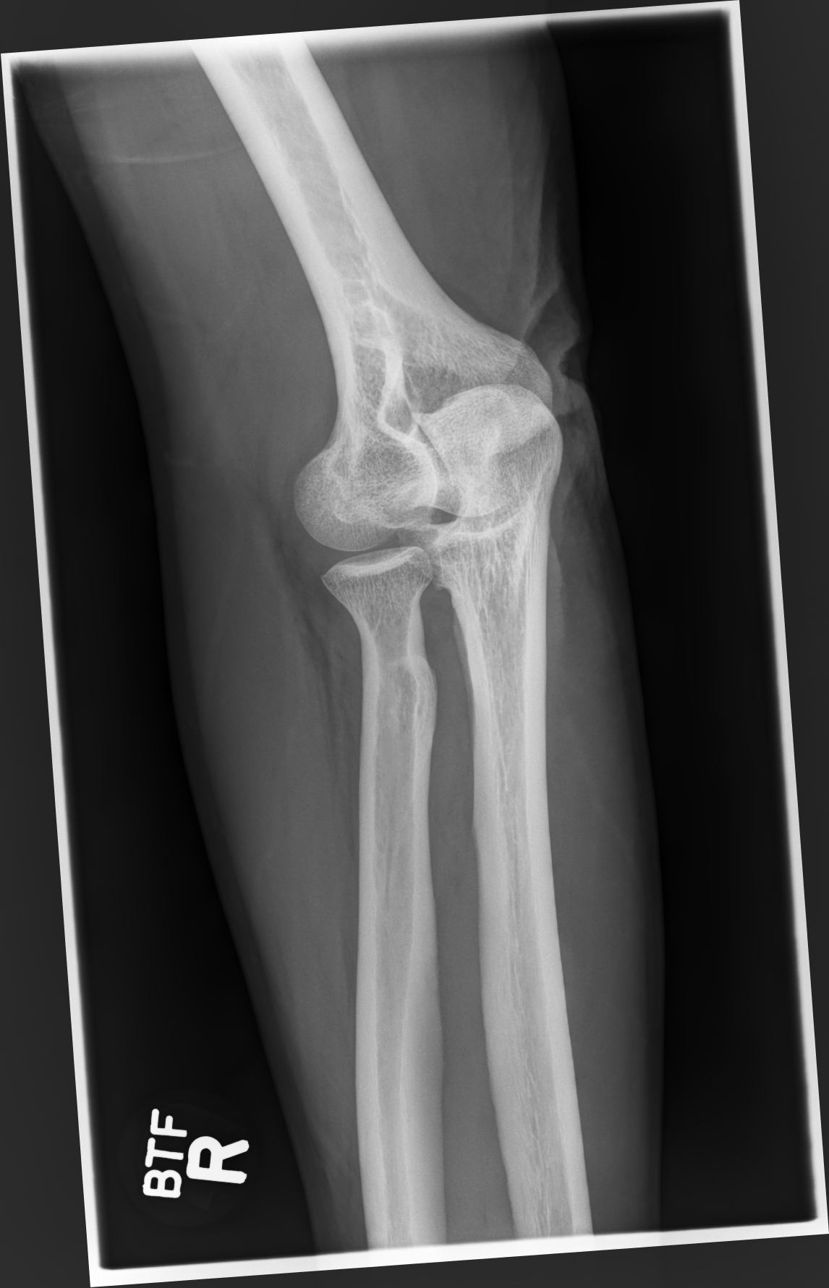

[elbow lat]
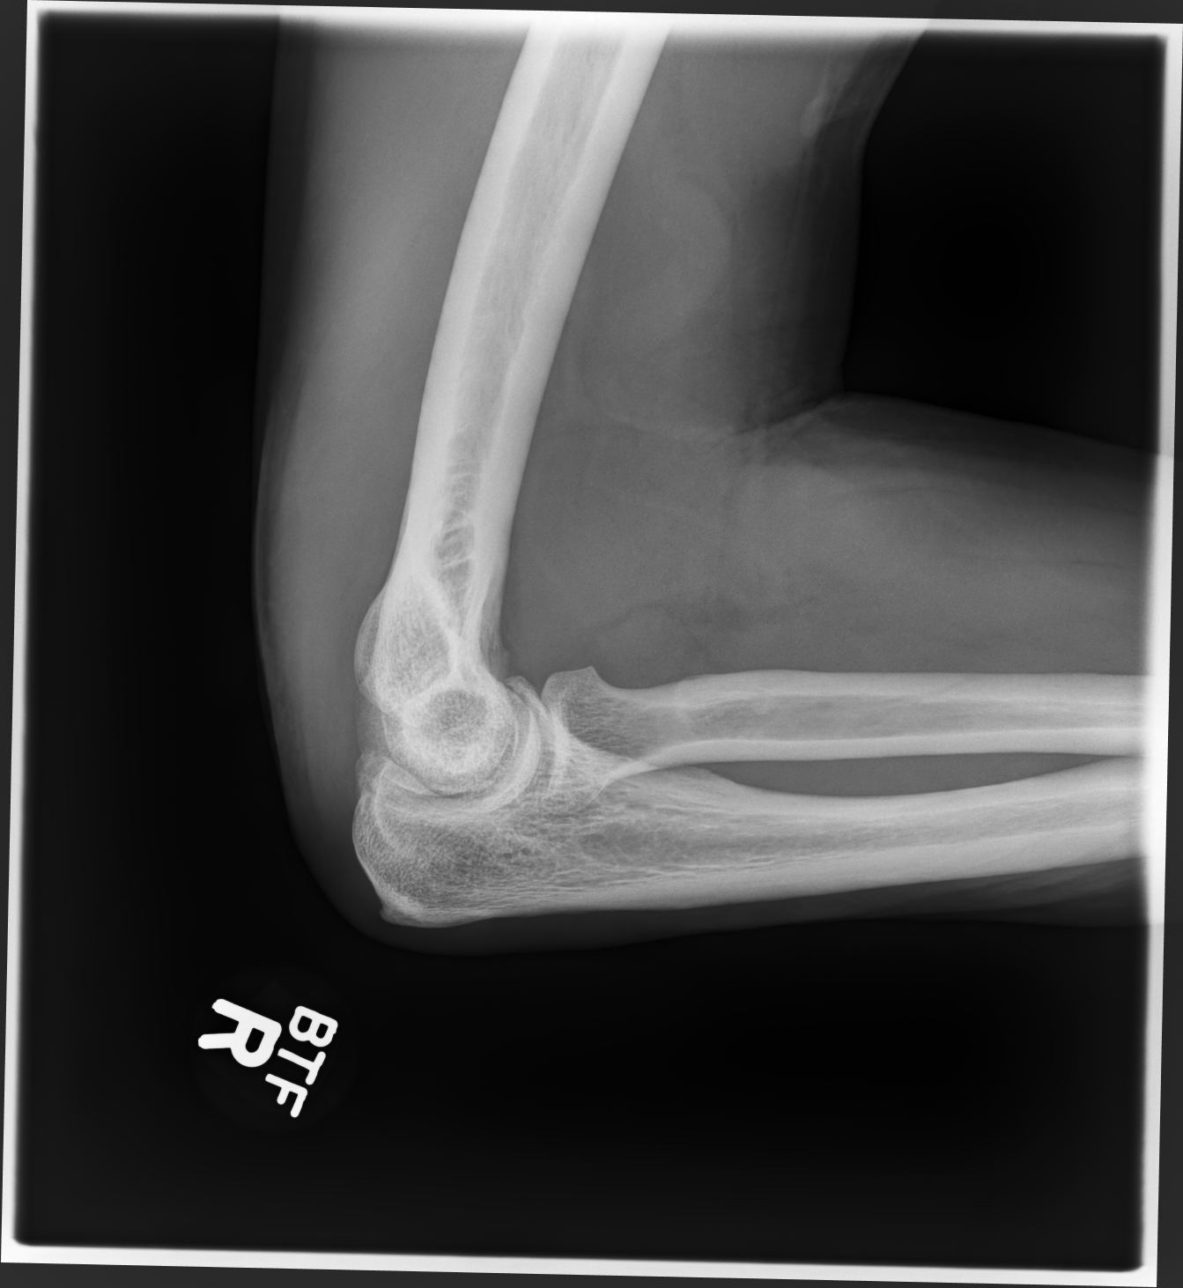

[4 of 4 positions shown; findings below may reference images not displayed]

FINDINGS: No fracture or dislocation.

Very mild degenerative changes.

Slight prominence distal triceps tendon possibly normal. If there
were triceps tendon dysfunction, MR could be obtained for further
delineation if clinically desired.

No secondary findings of joint effusion.
IMPRESSION: Very mild degenerative changes of the right elbow.

No fracture or dislocation.

Slight prominence distal triceps tendon possibly normal. If there
were triceps tendon dysfunction, MR could be obtained for further
delineation if clinically desired.

## 2017-10-03 MED ORDER — DICLOFENAC SODIUM 2 % TD SOLN: 1.0000 "application " | Freq: Two times a day (BID) | TRANSDERMAL | 2 refills | Status: DC

## 2017-10-03 MED ORDER — DICLOFENAC SODIUM 2 % TD SOLN: 1.0000 "application " | Freq: Two times a day (BID) | TRANSDERMAL | 0 refills | Status: AC

## 2017-10-03 MED ORDER — DICLOFENAC SODIUM 2 % TD SOLN
1.0000 | Freq: Two times a day (BID) | TRANSDERMAL | 2 refills | Status: DC
Start: 2017-10-03 — End: 2018-06-07

## 2017-10-03 NOTE — Progress Notes (Signed)
OFFICE VISIT NOTE Christopher Randall, [place] [place]  Christopher Randall - 56 y.o. male MRN [id number]  Date of birth: [birth year]  Visit Date: 10/03/2017  PCP: No primary care provider on file.   Referred by: Briscoe Deutscher, DO  Thalia Bloodgood PT, LAT, ATC acting as scribe for Dr. Paulla Fore.  SUBJECTIVE:   Chief Complaint  Patient presents with  . New Patient (Initial Visit)    R elbow pain   HPI: As below and per problem based documentation when appropriate.  Christopher Randall is a new pt presenting today w/ c/o R elbow pain and L knee pain.  [name]  He reports noticing a knot at his R lateral elbow epicondyle.  He states that this pain is now limiting him in terms of his physical fitness at the gym w/ [place] pressing.  He also reports that he can't fully extend his R elbow.  He describes the pain as a sharp, 2/10 pain localized to the lateral epicondyle of his R elbow.    In terms of the L knee, he states that he mountain bikes a lot.  He reports that the knee doesn't bother him as much as his elbow but feels like something is compromised in his knee and feels similar to what his R knee felt like prior to his R knee menisectomy performed about 5 years ago.  Pt reports some clicking in his L knee.  He also feels that he is favoring his L knee w/ weight bearing activity.  He rates the L knee pain as a 1-2/10 and describes it as a nagging pain.  Pt states that he has tried ice for both his elbow and knee and doesn't notice much improvement.  Pt also notes that he fell off a barstool on Saturday evening and bruised/scraped the medial aspect of his R knee/distal thighl       Review of Systems  Constitutional: Negative for chills, fever and weight loss.  HENT: Negative.   Eyes: Negative.   Respiratory:  Positive for cough. Negative for shortness of breath and wheezing.   Cardiovascular: Negative for chest pain and palpitations.  Gastrointestinal: Positive for diarrhea. Negative for abdominal pain, heartburn and nausea.  Musculoskeletal: Positive for falls and joint pain.  Neurological: Negative for dizziness, tingling and headaches.  Endo/Heme/Allergies: Does not bruise/bleed easily.  Psychiatric/Behavioral: Negative for depression. The patient is nervous/anxious and has insomnia.     Otherwise per HPI.  HISTORY & PERTINENT PRIOR DATA:  No specialty comments available. He reports that he has been smoking.  He has been smoking about 0.25 packs per day. He has never used smokeless tobacco. No results for input(s): HGBA1C, LABURIC in the last 8760 hours. Allergies reviewed per EMR Prior to Admission medications   Medication Sig Start Date End Date Taking? Authorizing Provider  LORazepam (ATIVAN) 1 MG tablet Take 1 tablet (1 mg total) by mouth 3 (three) times daily as needed for anxiety. 09/28/17  Yes Briscoe Deutscher, DO  Diclofenac Sodium (PENNSAID) 2 % SOLN Place 1 application onto the skin 2 (two) times daily. 10/03/17 10/04/17  Gerda Diss, DO  Diclofenac Sodium (PENNSAID) 2 % SOLN Place 1 application onto the skin 2 (two) times daily. 10/03/17   Gerda Diss, DO  varenicline (CHANTIX STARTING MONTH PAK) 0.5  MG X 11 & 1 MG X 42 tablet Take one 0.5 mg tablet by mouth once daily for 3 days, then increase to one 0.5 mg tablet twice daily for 4 days, then increase to one 1 mg tablet twice daily. Patient not taking: Reported on 10/03/2017 09/28/17   Briscoe Deutscher, DO   Patient Active Problem List   Diagnosis Date Noted  . Difficulty sleeping 09/28/2017  . Right elbow pain 09/28/2017  . Left knee pain 09/28/2017  . History of immunotherapy 02/23/2016  . Nicotine dependence 07/17/2015  . Examination of participant in clinical trial 09/11/2012  . Anxiety 07/03/2012  . Malignant  melanoma ([place]) 10/26/2011   Past Medical History:  Diagnosis Date  . Abnormal TSH 09/28/2017  . Anxiety 07/03/2012  . Depression   . Difficulty sleeping 09/28/2017  . Examination of participant in clinical trial 09/11/2012  . History of immunotherapy 02/23/2016  . Melanoma of skin 10/26/2011  . Nicotine dependence 07/17/2015  . Smoker    Family History  Problem Relation Age of Onset  . Hypertension Mother   . CAD Mother   . Lung cancer Father    Past Surgical History:  Procedure Laterality Date  . LEFT POPLITEAL MASS RESECTION  08/27/2011   pigmented villonodular synovitis, negative for malignancy  . LYMPH GLAND EXCISION  07/12/2011   right axilla, consistent with melanoma  . LYMPH NODE DISSECTION  08/27/2011   right, 0/17 nodes positive   Social History   Occupational History  .  [employer]   Social History Main Topics  . Smoking status: Current Every Day Smoker    Packs/day: 0.25  . Smokeless tobacco: Never Used  . Alcohol use 3.6 oz/week    6 Shots of liquor per week  . Drug use: No  . Sexual activity: Not on file    OBJECTIVE:  VS:  HT:5\' 9"  (175.3 cm)   WT:184 lb 6.4 oz (83.6 kg)  BMI:27.22    BP:140/88  HR:80bpm  TEMP: ( )  RESP:98 % EXAM: Findings:  WDWN, NAD, Non-toxic appearing Alert & appropriately interactive Not depressed or anxious appearing No increased work of breathing. Pupils are equal. EOM intact without nystagmus No clubbing or cyanosis of the extremities appreciated No significant rashes/lesions/ulcerations overlying the examined area. Radial pulses 2+/4.  No significant generalized UE edema. DP & PT pulses 2+/4.  No significant pretibial edema.  No clubbing or cyanosis Sensation intact to light touch in upper and lower extremities.  Right elbow is overall well aligned, he has a well-healed postsurgical incision along the inferior aspect of the olecranon.  He has a small amount of swelling over the lateral olecranon that is nontender.  He  has most focal tenderness over the lateral epicondyle.  He is ligamentously stable.  He has only minimal pain with terminal extension and has 2-3 degree extension deficit compared to the left.  He has full flexion and extension strength.  No pain or any weakness with supination or pronation.  Left knee: Overall well aligned.  No significant deformity.  Ligamentously stable.  He has pain with McMurray's and medial joint line pain that is mild.  No appreciable click.    RADIOLOGY: [place] LIMITED JOINT SPACE STRUCTURES LOW LEFT(NO LINKED CHARGES) Gerda Diss, DO     10/03/2017 12:12 PM PROCEDURE NOTE -  ULTRASOUND GUIDEDINJECTION: Left knee Images were obtained and interpreted by myself, Teresa Coombs, DO   Images have been saved and stored to PACS system. Images obtained on: GE S7  Ultrasound machine  ULTRASOUND FINDINGS: Moderate effusion, degenerative bulging  meniscus, medial  DESCRIPTION OF PROCEDURE:  The patient's clinical condition is marked by substantial pain  and/or significant functional disability. Other conservative  therapy has not provided relief, is contraindicated, or not  appropriate. There is a reasonable likelihood that injection will  significantly improve the patient's pain and/or functional  impairment. After discussing the risks, benefits and expected  outcomes of the injection and all questions were reviewed and  answered, the patient wished to undergo the above named  procedure. Verbal consent was obtained. The ultrasound was used  to identify the target structure and adjacent neurovascular  structures. The skin was then prepped in sterile fashion and the  target structure was injected under direct visualization using  sterile technique as below: PREP: Alcohol, Ethel Chloride APPROACH: Superior lateral, single injection, 25g 1.5" needle INJECTATE: 2 cc 0.5% marcaine, 2cc 40mg  DepoMedrol ASPIRATE: N/A DRESSING: Band-Aid  Post procedural instructions  including recommending icing and  warning signs for infection were reviewed. This procedure was  well tolerated and there were no complications.   IMPRESSION: Succesful US Guided Injection    ++++++++++++++++++++++++++++++++++++++++++++  Right elbow: Marked increased neovascularity within the common  extensor tendon.  There is hypoechoic change in this area as  well.  There is a small amount of osteophytic spurring of the  radial ulnar joint and along the anterior aspect of the coracoid.   There is no elbow effusion. Impression: Acute inflammatory extensor tendinitis versus lateral  epicondylitis. DG ELBOW COMPLETE RIGHT (3+VIEW) CLINICAL DATA:  56 year old male with elbow pain after lifting heavy objects 6-8 weeks ago. Pain worse recently. Initial encounter.  EXAM: RIGHT ELBOW - COMPLETE 3+ VIEW  COMPARISON:  None.  FINDINGS: No fracture or dislocation.  Very mild degenerative changes.  Slight prominence distal triceps tendon possibly normal. If there were triceps tendon dysfunction, MR could be obtained for further delineation if clinically desired.  No secondary findings of joint effusion.  IMPRESSION: Very mild degenerative changes of the right elbow.  No fracture or dislocation.  Slight prominence distal triceps tendon possibly normal. If there were triceps tendon dysfunction, MR could be obtained for further delineation if clinically desired.  Electronically Signed   By: Genia Del M.D.   On: 10/03/2017 11:34  ASSESSMENT & PLAN:     ICD-10-CM   1. Chronic pain of left knee M25.562 [place] LIMITED JOINT SPACE STRUCTURES LOW LEFT(NO LINKED CHARGES)   G89.29   2. Right elbow pain M25.521 DG ELBOW COMPLETE RIGHT (3+VIEW)    Misc procedure    DISCONTINUED: Diclofenac Sodium (PENNSAID) 2 % SOLN   ================================================================= Right elbow pain Symptoms are consistent with lateral epicondylitis given the mechanism for how  flared up this is.  If any lack of improvement further diagnostic evaluation with MRI will be indicated.  Therapeutic exercises with focus on eccentric phase reviewed with the athletic training staff today.  Topical Pennsaid to be used twice daily times 2-3 weeks.  Left knee pain Symptoms are consistent with a degenerative medial meniscal tear.  Injection performed today.  Avoid exacerbating activities.  If any lack of improvement consider further diagnostic evaluation with MRI.  PROCEDURE NOTE -  ULTRASOUND GUIDEDINJECTION: Left knee Images were obtained and interpreted by myself, Teresa Coombs, DO  Images have been saved and stored to PACS system. Images obtained on: GE S7 Ultrasound machine  ULTRASOUND FINDINGS: Moderate effusion, degenerative bulging meniscus, medial  DESCRIPTION OF PROCEDURE:  The patient's clinical condition is  marked by substantial pain and/or significant functional disability. Other conservative therapy has not provided relief, is contraindicated, or not appropriate. There is a reasonable likelihood that injection will significantly improve the patient's pain and/or functional impairment. After discussing the risks, benefits and expected outcomes of the injection and all questions were reviewed and answered, the patient wished to undergo the above named procedure. Verbal consent was obtained. The ultrasound was used to identify the target structure and adjacent neurovascular structures. The skin was then prepped in sterile fashion and the target structure was injected under direct visualization using sterile technique as below: PREP: Alcohol, Ethel Chloride APPROACH: Superior lateral, single injection, 25g 1.5" needle INJECTATE: 2 cc 0.5% marcaine, 2cc 40mg  DepoMedrol ASPIRATE: N/A DRESSING: Band-Aid  Post procedural instructions including recommending icing and warning signs for infection were reviewed. This procedure was well tolerated and there were no  complications.   IMPRESSION: Succesful US Guided Injection    ++++++++++++++++++++++++++++++++++++++++++++  Right elbow: Marked increased neovascularity within the common extensor tendon.  There is hypoechoic change in this area as well.  There is a small amount of osteophytic spurring of the radial ulnar joint and along the anterior aspect of the coracoid.  There is no elbow effusion. Impression: Acute inflammatory extensor tendinitis versus lateral epicondylitis.    PROCEDURE NOTE: THERAPEUTIC EXERCISES (97110) 15 minutes spent for Therapeutic exercises as below and as referenced in the AVS. This included exercises focusing on stretching, strengthening, with significant focus on eccentric aspects.  Proper technique shown and discussed handout in great detail with ATC. All questions were discussed and answered.   Long term goals include an improvement in range of motion, strength, endurance as well as avoiding reinjury. Frequency of visits is one time as determined during today's  office visit. Frequency of exercises to be performed is as per handout.  EXERCISES REVIEWED:  Eccentric wrist extension  Supination/pronation.  Ice massage.  ================================================================= Patient Instructions  You had an injection today.  Things to be aware of after injection are listed below: . You may experience no significant improvement or even a slight worsening in your symptoms during the first 24 to 48 hours.  After that we expect your symptoms to improve gradually over the next 2 weeks for the medicine to have its maximal effect.  You should continue to have improvement out to 6 weeks after your injection. . Dr. Paulla Fore recommends icing the site of the injection for 20 minutes  1-2 times the day of your injection . You may shower but no swimming, tub bath or Jacuzzi for 24 hours. . If your bandage falls off this does not need to be replaced.  It is appropriate to  remove the bandage after 4 hours. . You may resume light activities as tolerated unless otherwise directed per Dr. Paulla Fore during your visit  POSSIBLE STEROID SIDE EFFECTS:  Side effects from injectable steroids tend to be less than when taken orally however you may experience some of the symptoms listed below.  If experienced these should only last for a short period of time. Change in menstrual flow  Edema (swelling)  Increased appetite Skin flushing (redness)  Skin rash/acne  Thrush (oral) Yeast vaginitis    Increased sweating  Depression Increased blood glucose levels Cramping and leg/calf  Euphoria (feeling happy)  POSSIBLE PROCEDURE SIDE EFFECTS: The side effects of the injection are usually fairly minimal however if you may experience some of the following side effects that are usually self-limited and will is off on their  own.  If you are concerned please feel free to call the office with questions:  Increased numbness or tingling  Nausea or vomiting  Swelling or bruising at the injection site   Please call our office if if you experience any of the following symptoms over the next week as these can be signs of infection:   Fever greater than 100.12F  Significant swelling at the injection site  Significant redness or drainage from the injection site  If after 2 weeks you are continuing to have worsening symptoms please call our office to discuss what the next appropriate actions should be including the potential for a return office visit or other diagnostic testing.   Please perform the exercise program that we have prepared for you and gone over in detail on a daily basis.  In addition to the handout you were provided you can access your program through: [email or web address]   Your unique program code is: [email or web address]     ================================================================= Future Appointments Date Time Provider [place]  11/15/2017 9:00 AM Gerda Diss, DO [facility] None    Follow-up: Return in about 6 weeks (around 11/14/2017).   CMA/ATC served as Education administrator during this visit. History, Physical, and Plan performed by medical provider. Documentation and orders reviewed and attested to.      Teresa Coombs, [facility] Physician

## 2017-10-03 NOTE — Assessment & Plan Note (Signed)
Symptoms are consistent with a degenerative medial meniscal tear.  Injection performed today.  Avoid exacerbating activities.  If any lack of improvement consider further diagnostic evaluation with MRI.

## 2017-10-03 NOTE — Procedures (Signed)
PROCEDURE NOTE -  ULTRASOUND GUIDEDINJECTION: Left knee Images were obtained and interpreted by myself, Teresa Coombs, DO  Images have been saved and stored to PACS system. Images obtained on: GE S7 Ultrasound machine  ULTRASOUND FINDINGS: Moderate effusion, degenerative bulging meniscus, medial  DESCRIPTION OF PROCEDURE:  The patient's clinical condition is marked by substantial pain and/or significant functional disability. Other conservative therapy has not provided relief, is contraindicated, or not appropriate. There is a reasonable likelihood that injection will significantly improve the patient's pain and/or functional impairment. After discussing the risks, benefits and expected outcomes of the injection and all questions were reviewed and answered, the patient wished to undergo the above named procedure. Verbal consent was obtained. The ultrasound was used to identify the target structure and adjacent neurovascular structures. The skin was then prepped in sterile fashion and the target structure was injected under direct visualization using sterile technique as below: PREP: Alcohol, Ethel Chloride APPROACH: Superior lateral, single injection, 25g 1.5" needle INJECTATE: 2 cc 0.5% marcaine, 2cc 40mg  DepoMedrol ASPIRATE: N/A DRESSING: Band-Aid  Post procedural instructions including recommending icing and warning signs for infection were reviewed. This procedure was well tolerated and there were no complications.   IMPRESSION: Succesful US Guided Injection    ++++++++++++++++++++++++++++++++++++++++++++  Right elbow: Marked increased neovascularity within the common extensor tendon.  There is hypoechoic change in this area as well.  There is a small amount of osteophytic spurring of the radial ulnar joint and along the anterior aspect of the coracoid.  There is no elbow effusion. Impression: Acute inflammatory extensor tendinitis versus lateral epicondylitis.

## 2017-10-03 NOTE — Procedures (Signed)
PROCEDURE NOTE: THERAPEUTIC EXERCISES (97110) 15 minutes spent for Therapeutic exercises as below and as referenced in the AVS. This included exercises focusing on stretching, strengthening, with significant focus on eccentric aspects.  Proper technique shown and discussed handout in great detail with ATC. All questions were discussed and answered.   Long term goals include an improvement in range of motion, strength, endurance as well as avoiding reinjury. Frequency of visits is one time as determined during today's  office visit. Frequency of exercises to be performed is as per handout.  EXERCISES REVIEWED:  Eccentric wrist extension  Supination/pronation.  Ice massage.

## 2017-10-03 NOTE — Patient Instructions (Addendum)

## 2017-10-03 NOTE — Assessment & Plan Note (Signed)
Symptoms are consistent with lateral epicondylitis given the mechanism for how flared up this is.  If any lack of improvement further diagnostic evaluation with MRI will be indicated.  Therapeutic exercises with focus on eccentric phase reviewed with the athletic training staff today.  Topical Pennsaid to be used twice daily times 2-3 weeks.

## 2017-11-15 ENCOUNTER — Ambulatory Visit: Payer: Managed Care, Other (non HMO) | Admitting: Sports Medicine

## 2018-06-06 ENCOUNTER — Other Ambulatory Visit: Payer: Self-pay | Admitting: Family Medicine

## 2018-06-06 DIAGNOSIS — M25521 Pain in right elbow: Secondary | ICD-10-CM

## 2018-06-06 DIAGNOSIS — F419 Anxiety disorder, unspecified: Secondary | ICD-10-CM

## 2018-06-06 NOTE — Telephone Encounter (Signed)
Copied from [place] [id number]. Topic: Quick Communication - Rx Refill/Question >> Jun 06, 2018  1:46 PM Keene Breath wrote: Medication: Diclofenac Sodium (PENNSAID) 2 % SOLN, and LORazepam (ATIVAN) 1 MG tablet  Patient called to request a refill for medication  Preferred Pharmacy (with phone number or street name): [id number] - [place], [address] [phone] (Phone) [phone] (Fax)

## 2018-06-07 ENCOUNTER — Ambulatory Visit: Payer: Managed Care, Other (non HMO) | Admitting: Family Medicine

## 2018-06-07 ENCOUNTER — Encounter: Payer: Self-pay | Admitting: Family Medicine

## 2018-06-07 VITALS — BP 144/92 | HR 76 | Temp 97.9°F | Ht 69.0 in | Wt 196.0 lb

## 2018-06-07 DIAGNOSIS — M25562 Pain in left knee: Secondary | ICD-10-CM

## 2018-06-07 DIAGNOSIS — G8929 Other chronic pain: Secondary | ICD-10-CM

## 2018-06-07 DIAGNOSIS — M25521 Pain in right elbow: Secondary | ICD-10-CM

## 2018-06-07 DIAGNOSIS — F17210 Nicotine dependence, cigarettes, uncomplicated: Secondary | ICD-10-CM | POA: Diagnosis not present

## 2018-06-07 DIAGNOSIS — C439 Malignant melanoma of skin, unspecified: Secondary | ICD-10-CM

## 2018-06-07 DIAGNOSIS — F419 Anxiety disorder, unspecified: Secondary | ICD-10-CM

## 2018-06-07 MED ORDER — LORAZEPAM 1 MG PO TABS: 1.0000 mg | ORAL_TABLET | Freq: Two times a day (BID) | ORAL | 2 refills | Status: DC | PRN

## 2018-06-07 MED ORDER — DICLOFENAC SODIUM 2 % TD SOLN: 1.0000 "application " | Freq: Two times a day (BID) | TRANSDERMAL | 2 refills | Status: DC

## 2018-06-07 NOTE — Progress Notes (Signed)
Christopher Randall is a 57 y.o. male is here for follow up.  History of Present Illness:   HPI:   1. Anxiety. Doing well. Sleeping well with current treatment.    2. Cigarette nicotine dependence without complication. Not ready to quit, Wife quit after reading Dr. Elvin So book.   3. Chronic pain of left knee and right elbow. No new symptoms.    Health Maintenance Due  Topic Date Due  . Hepatitis C Screening  1961-07-09  . HIV Screening  01/12/1977  . TETANUS/TDAP  01/12/1981  . COLONOSCOPY  01/13/2012   Depression screen PHQ 2/9 09/28/2017  Decreased Interest 0  Down, Depressed, Hopeless 0  PHQ - 2 Score 0   PMHx, SurgHx, SocialHx, FamHx, Medications, and Allergies were reviewed in the Visit Navigator and updated as appropriate.   Patient Active Problem List   Diagnosis Date Noted  . Difficulty sleeping 09/28/2017  . Right elbow pain 09/28/2017  . Left knee pain 09/28/2017  . History of immunotherapy 02/23/2016  . Nicotine dependence 07/17/2015  . Examination of participant in clinical trial 09/11/2012  . Anxiety 07/03/2012  . Malignant melanoma ([place]) 10/26/2011   Social History   Tobacco Use  . Smoking status: Current Every Day Smoker    Packs/day: 0.25  . Smokeless tobacco: Never Used  Substance Use Topics  . Alcohol use: Yes    Alcohol/week: 3.6 oz    Types: 6 Shots of liquor per week  . Drug use: No   Current Medications and Allergies:   Current Outpatient Medications:  .  Diclofenac Sodium (PENNSAID) 2 % SOLN, Place 1 application onto the skin 2 (two) times daily., Disp: 112 g, Rfl: 2 .  LORazepam (ATIVAN) 1 MG tablet, Take 1 tablet (1 mg total) by mouth 2 (two) times daily as needed for anxiety., Disp: 60 tablet, Rfl: 2   Allergies  Allergen Reactions  . Iopamidol Hives and Itching    Isovue break through with prep, Hives and Itching  Patient had PET?CT scan with 125 mls of Isovue-300. Noted to have two raised itchy hives which  self-resolved. Patient had PET/CT scan with contrast on 05/27/2014 while on prednisone prep (3 doses of 50 mg-13 hour prep) and noted to have two hives which self -resolved without treatment  . Iodinated Diagnostic Agents Rash  . Iodine-131 Rash   Review of Systems   Pertinent items are noted in the HPI. Otherwise, ROS is negative.  Vitals:   Vitals:   06/07/18 1530  BP: (!) 144/92  Pulse: 76  Temp: 97.9 F (36.6 C)  TempSrc: Oral  SpO2: 97%  Weight: 196 lb (88.9 kg)  Height: 5\' 9"  (1.753 m)     Body mass index is 28.94 kg/m.  Physical Exam:   Physical Exam  Constitutional: He is oriented to person, place, and time. He appears well-developed and well-nourished. No distress.  HENT:  Head: Normocephalic and atraumatic.  Right Ear: External ear normal.  Left Ear: External ear normal.  Nose: Nose normal.  Mouth/Throat: Oropharynx is clear and moist.  Eyes: Pupils are equal, round, and reactive to light. Conjunctivae and EOM are normal.  Neck: Normal range of motion. Neck supple.  Cardiovascular: Normal rate, regular rhythm, normal heart sounds and intact distal pulses.  Pulmonary/Chest: Effort normal and breath sounds normal.  Abdominal: Soft. Bowel sounds are normal.  Musculoskeletal: Normal range of motion.  Neurological: He is alert and oriented to person, place, and time.  Skin: Skin is warm and dry.  Psychiatric: He has a normal mood and affect. His behavior is normal. Judgment and thought content normal.  Nursing note and vitals reviewed.  Assessment and Plan:   Jonta was seen today for medication refill.  Diagnoses and all orders for this visit:  Anxiety Comments: Refill today for anxiety/insomnia. No concerns. Orders: -     LORazepam (ATIVAN) 1 MG tablet; Take 1 tablet (1 mg total) by mouth 2 (two) times daily as needed for anxiety.  Cigarette nicotine dependence without complication Comments: Precontemplative.  Chronic pain of left knee -      Diclofenac Sodium (PENNSAID) 2 % SOLN; Place 1 application onto the skin 2 (two) times daily.  Right elbow pain -     Diclofenac Sodium (PENNSAID) 2 % SOLN; Place 1 application onto the skin 2 (two) times daily.  Malignant melanoma, unspecified site [facility]) Comments: Has upcoming appointment at [facility] for PET (October) for follow up. Orders: -     LORazepam (ATIVAN) 1 MG tablet; Take 1 tablet (1 mg total) by mouth 2 (two) times daily as needed for anxiety.    . Reviewed expectations re: course of current medical issues. . Discussed self-management of symptoms. . Outlined signs and symptoms indicating need for more acute intervention. . Patient verbalized understanding and all questions were answered. Marland Kitchen Health Maintenance issues including appropriate healthy diet, exercise, and smoking avoidance were discussed with patient. . See orders for this visit as documented in the electronic medical record. . Patient received an After Visit Summary.  Briscoe Deutscher, DO [place] 06/11/2018  Future Appointments  Date Time Provider [place]  12/08/2018  2:20 PM Briscoe Deutscher, DO [facility]

## 2018-06-07 NOTE — Telephone Encounter (Signed)
Patient is coming in today for appointment.

## 2018-06-07 NOTE — Telephone Encounter (Signed)
LOV 09/28/17 Dr. Juleen China Last refill Pennsaid  10/03/17  112g with 2 refills                 Ativan  09/28/17  # 90 with 1 refill

## 2018-06-07 NOTE — Telephone Encounter (Signed)
See note

## 2018-12-08 ENCOUNTER — Ambulatory Visit: Payer: Managed Care, Other (non HMO) | Admitting: Family Medicine

## 2018-12-24 NOTE — Progress Notes (Deleted)
   Christopher Randall is a 58 y.o. male is here for follow up.  History of Present Illness:   {CMA SCRIBE ATTESTATION}  HPI:   Health Maintenance Due  Topic Date Due  . Hepatitis C Screening  [birth year]  . HIV Screening  01/12/1977  . TETANUS/TDAP  01/12/1981  . COLONOSCOPY  01/13/2012  . INFLUENZA VACCINE  07/20/2018   Depression screen PHQ 2/9 09/28/2017  Decreased Interest 0  Down, Depressed, Hopeless 0  PHQ - 2 Score 0   PMHx, SurgHx, SocialHx, FamHx, Medications, and Allergies were reviewed in the Visit Navigator and updated as appropriate.   Patient Active Problem List   Diagnosis Date Noted  . Difficulty sleeping 09/28/2017  . Right elbow pain 09/28/2017  . Left knee pain 09/28/2017  . History of immunotherapy 02/23/2016  . Nicotine dependence 07/17/2015  . Examination of participant in clinical trial 09/11/2012  . Anxiety 07/03/2012  . Malignant melanoma ([place]) 10/26/2011   Social History   Tobacco Use  . Smoking status: Current Every Day Smoker    Packs/day: 0.25  . Smokeless tobacco: Never Used  Substance Use Topics  . Alcohol use: Yes    Alcohol/week: 6.0 standard drinks    Types: 6 Shots of liquor per week  . Drug use: No   Current Medications and Allergies:   Current Outpatient Medications:  .  Diclofenac Sodium (PENNSAID) 2 % SOLN, Place 1 application onto the skin 2 (two) times daily., Disp: 112 g, Rfl: 2 .  LORazepam (ATIVAN) 1 MG tablet, Take 1 tablet (1 mg total) by mouth 2 (two) times daily as needed for anxiety., Disp: 60 tablet, Rfl: 2  Allergies  Allergen Reactions  . Iopamidol Hives and Itching    Isovue break through with prep, Hives and Itching  Patient had PET?CT scan with 125 mls of Isovue-300. Noted to have two raised itchy hives which self-resolved. Patient had PET/CT scan with contrast on 05/27/2014 while on prednisone prep (3 doses of 50 mg-13 hour prep) and noted to have two hives which self -resolved without treatment  .  Iodinated Diagnostic Agents Rash  . Iodine-131 Rash   Review of Systems   Pertinent items are noted in the HPI. Otherwise, a complete ROS is negative.  Vitals:  There were no vitals filed for this visit.   There is no height or weight on file to calculate BMI.  Physical Exam:   Physical Exam  Results for orders placed or performed in visit on 09/28/17  TSH  Result Value Ref Range   TSH 0.74 0.35 - 4.50 uIU/mL  T4, free  Result Value Ref Range   Free T4 0.89 0.60 - 1.60 ng/dL    Assessment and Plan:   There are no diagnoses linked to this encounter.  . Orders and follow up as documented in [place], reviewed diet, exercise and weight control, cardiovascular risk and specific lipid/LDL goals reviewed, reviewed medications and side effects in detail.  . Reviewed expectations re: course of current medical issues. . Outlined signs and symptoms indicating need for more acute intervention. . Patient verbalized understanding and all questions were answered. . Patient received an After Visit Summary.  *** CMA served as Education administrator during this visit. History, Physical, and Plan performed by medical provider. The above documentation has been reviewed and is accurate and complete. Briscoe Deutscher, D.O.  Briscoe Deutscher, DO [place] 12/24/2018

## 2018-12-25 ENCOUNTER — Ambulatory Visit: Payer: Managed Care, Other (non HMO) | Admitting: Family Medicine

## 2019-01-14 NOTE — Progress Notes (Signed)
Christopher Randall is a 58 y.o. male is here for follow up.  History of Present Illness:   Doing well. Still followed at [facility] for melanoma Hx and monitoring. PET in October. Anxiety controlled. Still with trouble falling asleep, so take Ativan prn at night. No new symptoms. Chronic joint pain helped with the use of Voltaren. Evaluated by Dr. Paulla Fore.  Health Maintenance Due  Topic Date Due  . COLONOSCOPY  01/13/2012   Depression screen [facility] 2/9 01/15/2019 09/28/2017  Decreased Interest 0 0  Down, Depressed, Hopeless 0 0  PHQ - 2 Score 0 0  Altered sleeping 2 -  Tired, decreased energy 0 -  Change in appetite 0 -  Feeling bad or failure about yourself  0 -  Trouble concentrating 0 -  Moving slowly or fidgety/restless 0 -  Suicidal thoughts 0 -  PHQ-9 Score 2 -  Difficult doing work/chores Not difficult at all -   PMHx, SurgHx, SocialHx, FamHx, Medications, and Allergies were reviewed in the Visit Navigator and updated as appropriate.   Patient Active Problem List   Diagnosis Date Noted  . Insomnia 09/28/2017  . Right elbow pain 09/28/2017  . Left knee pain 09/28/2017  . History of immunotherapy 02/23/2016  . Nicotine dependence 07/17/2015  . Examination of participant in clinical trial 09/11/2012  . Anxiety, takes Ativan prn, mostly at night 07/03/2012  . Malignant melanoma ([place]) 10/26/2011   Social History   Tobacco Use  . Smoking status: Current Every Day Smoker    Packs/day: 0.25  . Smokeless tobacco: Never Used  Substance Use Topics  . Alcohol use: Yes    Alcohol/week: 6.0 standard drinks    Types: 6 Shots of liquor per week  . Drug use: No   Current Medications and Allergies   .  Diclofenac Sodium (PENNSAID) 2 % SOLN, Place 1 application onto the skin 2 (two) times daily., Disp: 112 g, Rfl: 2 .  LORazepam (ATIVAN) 1 MG tablet, Take 1 tablet (1 mg total) by mouth 2 (two) times daily as needed for anxiety., Disp: 60 tablet, Rfl: 2   Allergies  Allergen  Reactions  . Iopamidol Hives and Itching    Isovue break through with prep, Hives and Itching  Patient had PET?CT scan with 125 mls of Isovue-300. Noted to have two raised itchy hives which self-resolved. Patient had PET/CT scan with contrast on 05/27/2014 while on prednisone prep (3 doses of 50 mg-13 hour prep) and noted to have two hives which self -resolved without treatment  . Iodinated Diagnostic Agents Rash  . Iodine-131 Rash   Review of Systems   Pertinent items are noted in the HPI. Otherwise, a complete ROS is negative.  Vitals   Vitals:   01/15/19 1334  BP: 130/80  Pulse: 73  Temp: 98 F (36.7 C)  TempSrc: Oral  SpO2: 98%  Weight: 196 lb 3.2 oz (89 kg)  Height: 5\' 9"  (1.753 m)     Body mass index is 28.97 kg/m.  Physical Exam   Physical Exam Vitals signs and nursing note reviewed.  Constitutional:      General: He is not in acute distress.    Appearance: He is well-developed.  HENT:     Head: Normocephalic and atraumatic.     Right Ear: External ear normal.     Left Ear: External ear normal.     Nose: Nose normal.  Eyes:     Conjunctiva/sclera: Conjunctivae normal.     Pupils: Pupils are equal,  round, and reactive to light.  Neck:     Musculoskeletal: Neck supple.  Cardiovascular:     Rate and Rhythm: Normal rate and regular rhythm.  Pulmonary:     Effort: Pulmonary effort is normal.  Abdominal:     General: Bowel sounds are normal.     Palpations: Abdomen is soft.  Musculoskeletal: Normal range of motion.  Skin:    General: Skin is warm.  Neurological:     Mental Status: He is alert.  Psychiatric:        Behavior: Behavior normal.    Assessment and Plan   Diagnoses and all orders for this visit:  Anxiety -     LORazepam (ATIVAN) 1 MG tablet; Take 1 tablet (1 mg total) by mouth 2 (two) times daily as needed for anxiety.  Psychophysiological insomnia  Encounter for hepatitis C screening test for low risk patient -     Hepatitis C  antibody  Screening for colon cancer -     Cologuard  Cigarette nicotine dependence without complication -     Comprehensive metabolic panel -     CBC with Differential/Platelet  Screening for HIV (human immunodeficiency virus) -     HIV Antibody (routine testing w rflx)  Right elbow pain -     Diclofenac Sodium 1.5 % SOLN; Place one application on to the skin 2x daily.  Chronic pain of left knee -     Diclofenac Sodium 1.5 % SOLN; Place one application on to the skin 2x daily.  Malignant melanoma, unspecified site [facility]) Comments: Has upcoming appointment at [facility] for PET (October) for follow up.  Screening for lipid disorders -     Lipid panel    . Orders and follow up as documented in [place], reviewed diet, exercise and weight control, cardiovascular risk and specific lipid/LDL goals reviewed, reviewed medications and side effects in detail.  . Reviewed expectations re: course of current medical issues. . Outlined signs and symptoms indicating need for more acute intervention. . Patient verbalized understanding and all questions were answered. . Patient received an After Visit Summary.  Briscoe Deutscher, DO [place], [facility] 01/18/2019

## 2019-01-15 ENCOUNTER — Ambulatory Visit: Payer: Managed Care, Other (non HMO) | Admitting: Family Medicine

## 2019-01-15 ENCOUNTER — Encounter: Payer: Self-pay | Admitting: Family Medicine

## 2019-01-15 VITALS — BP 130/80 | HR 73 | Temp 98.0°F | Ht 69.0 in | Wt 196.2 lb

## 2019-01-15 DIAGNOSIS — F17210 Nicotine dependence, cigarettes, uncomplicated: Secondary | ICD-10-CM | POA: Diagnosis not present

## 2019-01-15 DIAGNOSIS — M25562 Pain in left knee: Secondary | ICD-10-CM

## 2019-01-15 DIAGNOSIS — Z1159 Encounter for screening for other viral diseases: Secondary | ICD-10-CM

## 2019-01-15 DIAGNOSIS — C439 Malignant melanoma of skin, unspecified: Secondary | ICD-10-CM

## 2019-01-15 DIAGNOSIS — F5104 Psychophysiologic insomnia: Secondary | ICD-10-CM | POA: Diagnosis not present

## 2019-01-15 DIAGNOSIS — M25521 Pain in right elbow: Secondary | ICD-10-CM | POA: Diagnosis not present

## 2019-01-15 DIAGNOSIS — Z13220 Encounter for screening for lipoid disorders: Secondary | ICD-10-CM | POA: Diagnosis not present

## 2019-01-15 DIAGNOSIS — F419 Anxiety disorder, unspecified: Secondary | ICD-10-CM | POA: Diagnosis not present

## 2019-01-15 DIAGNOSIS — Z114 Encounter for screening for human immunodeficiency virus [HIV]: Secondary | ICD-10-CM

## 2019-01-15 DIAGNOSIS — Z1211 Encounter for screening for malignant neoplasm of colon: Secondary | ICD-10-CM

## 2019-01-15 DIAGNOSIS — G8929 Other chronic pain: Secondary | ICD-10-CM

## 2019-01-15 MED ORDER — LORAZEPAM 1 MG PO TABS: 1.0000 mg | ORAL_TABLET | Freq: Two times a day (BID) | ORAL | 2 refills | Status: DC | PRN

## 2019-01-15 MED ORDER — DICLOFENAC SODIUM 1.5 % TD SOLN: TRANSDERMAL | 1 refills | Status: DC

## 2019-01-16 LAB — COMPREHENSIVE METABOLIC PANEL
ALT: 22 U/L (ref 0–53)
AST: 20 U/L (ref 0–37)
Albumin: 4.1 g/dL (ref 3.5–5.2)
Alkaline Phosphatase: 73 U/L (ref 39–117)
BUN: 15 mg/dL (ref 6–23)
CO2: 27 mEq/L (ref 19–32)
Calcium: 9.4 mg/dL (ref 8.4–10.5)
Chloride: 101 mEq/L (ref 96–112)
Creatinine, Ser: 0.74 mg/dL (ref 0.40–1.50)
GFR: 109.01 mL/min (ref >60.00)
Glucose, Bld: 85 mg/dL (ref 70–99)
Potassium: 4.1 mEq/L (ref 3.5–5.1)
Sodium: 137 mEq/L (ref 135–145)
Total Bilirubin: 0.3 mg/dL (ref 0.2–1.2)
Total Protein: 6.5 g/dL (ref 6.0–8.3)

## 2019-01-16 LAB — CBC WITH DIFFERENTIAL/PLATELET
Basophils Absolute: 0.1 10*3/uL (ref 0.0–0.1)
Basophils Relative: 1 % (ref 0.0–3.0)
Eosinophils Absolute: 0.1 10*3/uL (ref 0.0–0.7)
Eosinophils Relative: 1.1 % (ref 0.0–5.0)
HCT: 44.1 % (ref 39.0–52.0)
Hemoglobin: 14.9 g/dL (ref 13.0–17.0)
Lymphocytes Relative: 23.2 % (ref 12.0–46.0)
Lymphs Abs: 2.7 10*3/uL (ref 0.7–4.0)
MCHC: 33.8 g/dL (ref 30.0–36.0)
MCV: 95.4 fl (ref 78.0–100.0)
Monocytes Absolute: 0.8 10*3/uL (ref 0.1–1.0)
Monocytes Relative: 7.2 % (ref 3.0–12.0)
Neutro Abs: 7.9 10*3/uL — ABNORMAL HIGH (ref 1.4–7.7)
Neutrophils Relative %: 67.5 % (ref 43.0–77.0)
Platelets: 354 10*3/uL (ref 150.0–400.0)
RBC: 4.62 Mil/uL (ref 4.22–5.81)
RDW: 13.5 % (ref 11.5–15.5)
WBC: 11.7 10*3/uL — ABNORMAL HIGH (ref 4.0–10.5)

## 2019-01-16 LAB — LIPID PANEL
Cholesterol: 179 mg/dL (ref 0–200)
HDL: 49.6 mg/dL (ref >39.00)
LDL Cholesterol: 105 mg/dL — ABNORMAL HIGH (ref 0–99)
NonHDL: 129.63
Total CHOL/HDL Ratio: 4
Triglycerides: 122 mg/dL (ref 0.0–149.0)
VLDL: 24.4 mg/dL (ref 0.0–40.0)

## 2019-01-16 LAB — HIV ANTIBODY (ROUTINE TESTING W REFLEX): HIV 1&2 Ab, 4th Generation: NONREACTIVE

## 2019-01-16 LAB — HEPATITIS C ANTIBODY
Hepatitis C Ab: NONREACTIVE
SIGNAL TO CUT-OFF: 0.02 (ref <1.00)

## 2019-01-18 ENCOUNTER — Encounter: Payer: Self-pay | Admitting: Family Medicine

## 2019-07-16 ENCOUNTER — Ambulatory Visit: Payer: Managed Care, Other (non HMO) | Admitting: Family Medicine

## 2019-07-27 DIAGNOSIS — D225 Melanocytic nevi of trunk: Secondary | ICD-10-CM | POA: Diagnosis not present

## 2019-07-27 DIAGNOSIS — D1801 Hemangioma of skin and subcutaneous tissue: Secondary | ICD-10-CM | POA: Diagnosis not present

## 2019-07-27 DIAGNOSIS — L570 Actinic keratosis: Secondary | ICD-10-CM | POA: Diagnosis not present

## 2019-07-27 DIAGNOSIS — D485 Neoplasm of uncertain behavior of skin: Secondary | ICD-10-CM | POA: Diagnosis not present

## 2019-07-27 DIAGNOSIS — Z85820 Personal history of malignant melanoma of skin: Secondary | ICD-10-CM | POA: Diagnosis not present

## 2019-07-27 DIAGNOSIS — L821 Other seborrheic keratosis: Secondary | ICD-10-CM | POA: Diagnosis not present

## 2019-08-20 ENCOUNTER — Other Ambulatory Visit: Payer: Self-pay

## 2019-08-20 DIAGNOSIS — Z20822 Contact with and (suspected) exposure to covid-19: Secondary | ICD-10-CM

## 2019-08-21 LAB — NOVEL CORONAVIRUS, NAA: SARS-CoV-2, NAA: NOT DETECTED

## 2020-05-13 ENCOUNTER — Telehealth (INDEPENDENT_AMBULATORY_CARE_PROVIDER_SITE_OTHER): Payer: BC Managed Care – PPO | Admitting: Family Medicine

## 2020-05-13 ENCOUNTER — Encounter: Payer: Self-pay | Admitting: Family Medicine

## 2020-05-13 VITALS — Wt 184.0 lb

## 2020-05-13 DIAGNOSIS — R519 Headache, unspecified: Secondary | ICD-10-CM | POA: Diagnosis not present

## 2020-05-13 DIAGNOSIS — R0981 Nasal congestion: Secondary | ICD-10-CM | POA: Diagnosis not present

## 2020-05-13 DIAGNOSIS — J3489 Other specified disorders of nose and nasal sinuses: Secondary | ICD-10-CM

## 2020-05-13 MED ORDER — AMOXICILLIN-POT CLAVULANATE 875-125 MG PO TABS
1.0000 | ORAL_TABLET | Freq: Two times a day (BID) | ORAL | 0 refills | Status: DC
Start: 2020-05-13 — End: 2021-08-10

## 2020-05-13 NOTE — Progress Notes (Signed)
Virtual Visit via Video Note  I connected with Christopher Randall  on 05/13/20 at  1:00 PM EDT by a video enabled telemedicine application and verified that I am speaking with the correct person using two identifiers.  Location patient: home, [place] Location provider:work or home office Persons participating in the virtual visit: patient, provider  I discussed the limitations of evaluation and management by telemedicine and the availability of in person appointments. The patient expressed understanding and agreed to proceed.   HPI:  Acute visit for sinus issues: -has had some chronic nasal congestion with allergies this spring, but worse the last 3-4 days -symptoms include yellow nasal congestion, thick - bad odor to the mucus, drainage, sinus discomfort frontal and pressure -this feels like when he had a sinus infection in the past -denies fevers, SOB, loss of taste, NVD -fully vaccinated for COVID19 with pfizer - over 1 month from last dose, also still using covid precautions and masking -does not have an antibiotic allergies -he saw dentist last week, has some issues with the bone under his teeth on the lower jaw, seeing oral surgeon for that -he sees his oncologist tomorrow about this and his follow up too for hx of melanoma  ROS: See pertinent positives and negatives per HPI.  Past Medical History:  Diagnosis Date  . Abnormal TSH 09/28/2017  . Anxiety 07/03/2012  . Depression   . Difficulty sleeping 09/28/2017  . Examination of participant in clinical trial 09/11/2012  . History of immunotherapy 02/23/2016  . Melanoma of skin 10/26/2011  . Nicotine dependence 07/17/2015  . Smoker     Past Surgical History:  Procedure Laterality Date  . LEFT POPLITEAL MASS RESECTION  08/27/2011   pigmented villonodular synovitis, negative for malignancy  . LYMPH GLAND EXCISION  07/12/2011   right axilla, consistent with melanoma  . LYMPH NODE DISSECTION  08/27/2011   right, 0/17 nodes positive    Family  History  Problem Relation Age of Onset  . Hypertension Mother   . CAD Mother   . Lung cancer Father     SOCIAL HX: see hpi   Current Outpatient Medications:  .  Diclofenac Sodium 1.5 % SOLN, Place one application on to the skin 2x daily. (Patient taking differently: Place one application on to the skin 2x daily as needed.), Disp: 1.5 Bottle, Rfl: 1 .  LORazepam (ATIVAN) 1 MG tablet, Take 1 tablet (1 mg total) by mouth 2 (two) times daily as needed for anxiety., Disp: 60 tablet, Rfl: 2 .  amoxicillin-clavulanate (AUGMENTIN) 875-125 MG tablet, Take 1 tablet by mouth 2 (two) times daily., Disp: 20 tablet, Rfl: 0  EXAM:  VITALS per patient if applicable:  GENERAL: alert, oriented, appears well and in no acute distress  HEENT: atraumatic, conjunttiva clear, no obvious abnormalities on inspection of external nose and ears, points to frontal region as area of sinus discomfort  NECK: normal movements of the head and neck  LUNGS: on inspection no signs of respiratory distress, breathing rate appears normal, no obvious gross SOB, gasping or wheezing  CV: no obvious cyanosis  MS: moves all visible extremities without noticeable abnormality  PSYCH/NEURO: pleasant and cooperative, no obvious depression or anxiety, speech and thought processing grossly intact  ASSESSMENT AND PLAN:  Discussed the following assessment and plan:  Sinus congestion  Facial discomfort  Drainage from nose  -we discussed possible serious and likely etiologies, options for evaluation and workup, limitations of telemedicine visit vs in person visit, treatment, treatment risks and precautions. Pt  prefers to treat via telemedicine empirically rather then risking or undertaking an in person visit at this moment for this. Opted for empiric treatment for possible sinusitis given classic symptoms, Augmentin 875 bid for 7-10 days. Also discussed allergy regimen for chronic issues to try, antihistamine. Glad he is seeing  oral surgeon and oncologist as well about the other issues. Patient agrees to seek prompt in person care if worsening, new symptoms arise, or if is not improving with treatment.   I discussed the assessment and treatment plan with the patient. The patient was provided an opportunity to ask questions and all were answered. The patient agreed with the plan and demonstrated an understanding of the instructions.   The patient was advised to call back or seek an in-person evaluation if the symptoms worsen or if the condition fails to improve as anticipated.   Lucretia Kern, DO

## 2020-05-14 DIAGNOSIS — C4361 Malignant melanoma of right upper limb, including shoulder: Secondary | ICD-10-CM | POA: Diagnosis not present

## 2020-06-24 ENCOUNTER — Encounter: Payer: BC Managed Care – PPO | Admitting: Physician Assistant

## 2020-07-10 DIAGNOSIS — Z20822 Contact with and (suspected) exposure to covid-19: Secondary | ICD-10-CM | POA: Diagnosis not present

## 2020-07-22 DIAGNOSIS — Z20822 Contact with and (suspected) exposure to covid-19: Secondary | ICD-10-CM | POA: Diagnosis not present

## 2020-08-04 ENCOUNTER — Encounter: Payer: BC Managed Care – PPO | Admitting: Physician Assistant

## 2021-08-10 ENCOUNTER — Ambulatory Visit: Payer: BC Managed Care – PPO | Admitting: Physician Assistant

## 2021-08-10 ENCOUNTER — Encounter: Payer: Self-pay | Admitting: Physician Assistant

## 2021-08-10 ENCOUNTER — Other Ambulatory Visit: Payer: Self-pay

## 2021-08-10 VITALS — BP 131/89 | HR 81 | Temp 98.1°F | Ht 69.0 in | Wt 182.2 lb

## 2021-08-10 DIAGNOSIS — F17210 Nicotine dependence, cigarettes, uncomplicated: Secondary | ICD-10-CM | POA: Diagnosis not present

## 2021-08-10 DIAGNOSIS — R490 Dysphonia: Secondary | ICD-10-CM | POA: Diagnosis not present

## 2021-08-10 DIAGNOSIS — R0789 Other chest pain: Secondary | ICD-10-CM

## 2021-08-10 NOTE — Patient Instructions (Addendum)
Great to meet you today!! Awesome job with your action of quitting smoking! Please let me know how your progress is going.  Call if you need any assistance. Referral to ENT placed. They should call to schedule. Please get chest XRAY done at [facility].   See you back for CPE as scheduled.

## 2021-08-10 NOTE — Progress Notes (Signed)
Established Patient Office Visit  Subjective:  Patient ID: Christopher Randall, male    DOB: 03/19/1961  Age: 60 y.o. MRN: [id number]  CC:  Chief Complaint  Patient presents with   Nicotine Dependence    HPI [name] presents for smoking concern. He is actively trying to quit smoking at this time. He has been smoking since his young 34s. Smoking more in the last few years and has realized it's getting out of hand. He has been keeping a log & weaning himself down. 2 ppd to 8 cigs/day in the last 10 days. Walking 2.5 miles per day. He has been weightlifting as well. Intentionally has lost weight in the last month: 190 to 182 lbs. He had the most success with Wellbutrin in the past, but says that he feels like he does not need this right now. Chantix was not covered by his insurance previously.   Hx R axillary metastatic melanoma stage 3c diagnosed in 2012.  He has not had a skin check in a while nor has he had a regular physical exam in a while.  States that he has been having right-sided chest pain intermittently on the right axillary line as well as in the right scapular area.  Feels like it is worse when he is bench pressing.  He has not tried anything for the pain.  Up to date on dental exam.  States they did not see any signs of oral cancer. New hoarseness to voice that he is worried about as well.   His father died of lung cancer at the age of 74.  Past Medical History:  Diagnosis Date   Abnormal TSH 09/28/2017   Anxiety 07/03/2012   Depression    Difficulty sleeping 09/28/2017   Examination of participant in clinical trial 09/11/2012   History of immunotherapy 02/23/2016   Melanoma of skin 10/26/2011   Nicotine dependence 07/17/2015   Smoker     Past Surgical History:  Procedure Laterality Date   LEFT POPLITEAL MASS RESECTION  08/27/2011   pigmented villonodular synovitis, negative for malignancy   LYMPH GLAND EXCISION  07/12/2011   right axilla, consistent with melanoma    LYMPH NODE DISSECTION  08/27/2011   right, 0/17 nodes positive    Family History  Problem Relation Age of Onset   Hypertension Mother    CAD Mother    Lung cancer Father     Social History   Socioeconomic History   Marital status: Divorced    Spouse name: Not on file   Number of children: 3   Years of education: Not on file   Highest education level: Not on file  Occupational History    Employer: [employer]  Tobacco Use   Smoking status: Every Day    Packs/day: 0.25    Types: Cigarettes   Smokeless tobacco: Never  Substance and Sexual Activity   Alcohol use: Yes    Alcohol/week: 6.0 standard drinks    Types: 6 Shots of liquor per week   Drug use: No   Sexual activity: Not on file  Other Topics Concern   Not on file  Social History Narrative   Not on file   Social Determinants of Health   Financial Resource Strain: Not on file  Food Insecurity: Not on file  Transportation Needs: Not on file  Physical Activity: Not on file  Stress: Not on file  Social Connections: Not on file  Intimate Partner Violence: Not on file    Outpatient  Medications Prior to Visit  Medication Sig Dispense Refill   Diclofenac Sodium 1.5 % SOLN Place one application on to the skin 2x daily. (Patient taking differently: Place one application on to the skin 2x daily as needed.) 1.5 Bottle 1   LORazepam (ATIVAN) 1 MG tablet Take 1 tablet (1 mg total) by mouth 2 (two) times daily as needed for anxiety. 60 tablet 2   amoxicillin-clavulanate (AUGMENTIN) 875-125 MG tablet Take 1 tablet by mouth 2 (two) times daily. 20 tablet 0   No facility-administered medications prior to visit.    Allergies  Allergen Reactions   Iopamidol Hives and Itching    Isovue break through with prep, Hives and Itching  Patient had PET?CT scan with 125 mls of Isovue-300. Noted to have two raised itchy hives which self-resolved. Patient had PET/CT scan with contrast on 05/27/2014 while on prednisone prep (3  doses of 50 mg-13 hour prep) and noted to have two hives which self -resolved without treatment   Iodinated Diagnostic Agents Rash   Iodine-131 Rash    ROS Review of Systems REFER TO HPI FOR PERTINENT POSITIVES AND NEGATIVES    Objective:    Physical Exam Vitals and nursing note reviewed.  Constitutional:      Appearance: Normal appearance.  HENT:     Head: Normocephalic and atraumatic.     Right Ear: External ear normal.     Left Ear: External ear normal.     Nose: Nose normal.     Mouth/Throat:     Comments: HOARSENESS Cardiovascular:     Rate and Rhythm: Normal rate and regular rhythm.     Pulses: Normal pulses.     Heart sounds: Normal heart sounds. No murmur heard. Pulmonary:     Effort: Pulmonary effort is normal.     Breath sounds: Normal breath sounds.  Chest:     Comments: Right sided chest pain not reproducible. Skin:    General: Skin is warm and dry.  Neurological:     General: No focal deficit present.     Mental Status: He is alert and oriented to person, place, and time.  Psychiatric:        Mood and Affect: Mood normal.        Behavior: Behavior normal.    BP 131/89   Pulse 81   Temp 98.1 F (36.7 C)   Ht 5\' 9"  (1.753 m)   Wt 182 lb 3.2 oz (82.6 kg)   SpO2 98%   BMI 26.91 kg/m  Wt Readings from Last 3 Encounters:  08/10/21 182 lb 3.2 oz (82.6 kg)  05/13/20 184 lb (83.5 kg)  01/15/19 196 lb 3.2 oz (89 kg)     Health Maintenance Due  Topic Date Due   COVID-19 Vaccine (1) Never done   Pneumococcal Vaccine 62-35 Years old (1 - PCV) Never done   TETANUS/TDAP  Never done   Zoster Vaccines- Shingrix (1 of 2) Never done   COLONOSCOPY (Pts 45-15yrs Insurance coverage will need to be confirmed)  Never done   INFLUENZA VACCINE  07/20/2021    There are no preventive care reminders to display for this patient.  Lab Results  Component Value Date   TSH 0.74 09/28/2017   Lab Results  Component Value Date   WBC 11.7 (H) 01/15/2019   HGB 14.9  01/15/2019   HCT 44.1 01/15/2019   MCV 95.4 01/15/2019   PLT 354.0 01/15/2019   Lab Results  Component Value Date   NA 137 01/15/2019  K 4.1 01/15/2019   CO2 27 01/15/2019   GLUCOSE 85 01/15/2019   BUN 15 01/15/2019   CREATININE 0.74 01/15/2019   BILITOT 0.3 01/15/2019   ALKPHOS 73 01/15/2019   AST 20 01/15/2019   ALT 22 01/15/2019   PROT 6.5 01/15/2019   ALBUMIN 4.1 01/15/2019   CALCIUM 9.4 01/15/2019   GFR 109.01 01/15/2019   Lab Results  Component Value Date   CHOL 179 01/15/2019   Lab Results  Component Value Date   HDL 49.60 01/15/2019   Lab Results  Component Value Date   LDLCALC 105 (H) 01/15/2019   Lab Results  Component Value Date   TRIG 122.0 01/15/2019   Lab Results  Component Value Date   CHOLHDL 4 01/15/2019   No results found for: HGBA1C    Assessment & Plan:   Problem List Items Addressed This Visit       Other   Nicotine dependence - Primary   Relevant Orders   DG Chest 2 View   Ambulatory referral to ENT   Other Visit Diagnoses     Intermittent right-sided chest pain       Relevant Orders   DG Chest 2 View   Quality of voice, hoarse       Relevant Orders   Ambulatory referral to ENT       No orders of the defined types were placed in this encounter.   Follow-up: Return for *as scheduled in November, but please change TOC appt to fasting labs and CPE.   1. Cigarette nicotine dependence without complication 2. Intermittent right-sided chest pain 3. Quality of voice, hoarse -Currently in the active stage of quitting. -He is weaning himself off of cigarettes himself.  He does not feel like he needs any additional medication help at this time. -He is of course worried about possibility of lung cancer with his history of smoking, his father's history of lung cancer and ultimate cause of death, and his personal history of malignant melanoma.  His new chest pain sounds musculoskeletal to me, however I think a chest x-ray is  definitely warranted at this time. -We will also need to schedule a low-dose screening CT. -Referral to ENT for his hoarseness in his voice and his history of smoking.    Lonnetta Kniskern M Carel Carrier, PA-C

## 2021-08-11 ENCOUNTER — Ambulatory Visit (INDEPENDENT_AMBULATORY_CARE_PROVIDER_SITE_OTHER)
Admission: RE | Admit: 2021-08-11 | Discharge: 2021-08-11 | Disposition: A | Discharge status: Home / Self Care | Payer: BC Managed Care – PPO | Source: Ambulatory Visit | Attending: Physician Assistant | Admitting: Physician Assistant

## 2021-08-11 ENCOUNTER — Other Ambulatory Visit: Payer: Self-pay | Admitting: Physician Assistant

## 2021-08-11 DIAGNOSIS — J189 Pneumonia, unspecified organism: Secondary | ICD-10-CM | POA: Diagnosis not present

## 2021-08-11 DIAGNOSIS — R0789 Other chest pain: Secondary | ICD-10-CM

## 2021-08-11 DIAGNOSIS — F17210 Nicotine dependence, cigarettes, uncomplicated: Secondary | ICD-10-CM | POA: Diagnosis not present

## 2021-08-11 DIAGNOSIS — R079 Chest pain, unspecified: Secondary | ICD-10-CM | POA: Diagnosis not present

## 2021-08-11 IMAGING — DX DG CHEST 2V
2 series · 3 of 3 positions shown · non-contrast
Comparison: None.

CLINICAL DATA: 60-year-old male with intermittent right-sided chest
pain

EXAM:
CHEST - 2 VIEW

[Series 1: chest pa · 0.14mm/px · 2 of 2 slices shown]
[im 1/2]
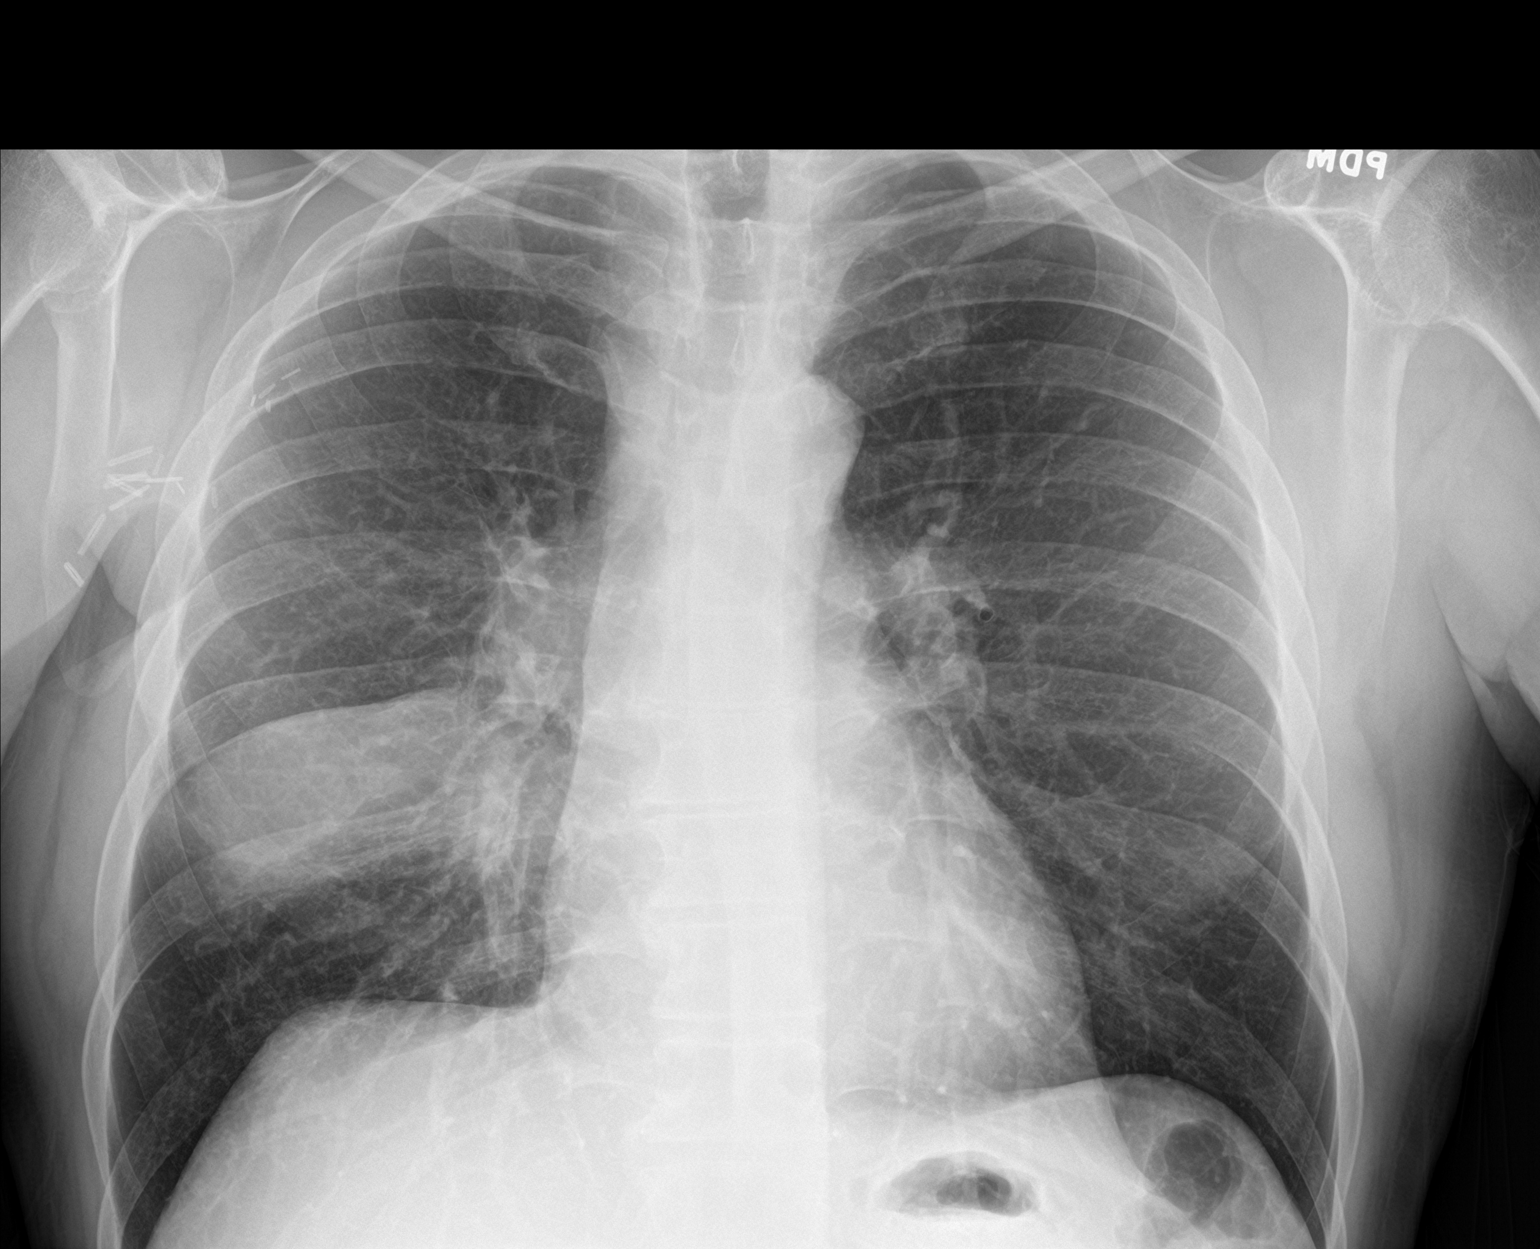
[im 2/2]
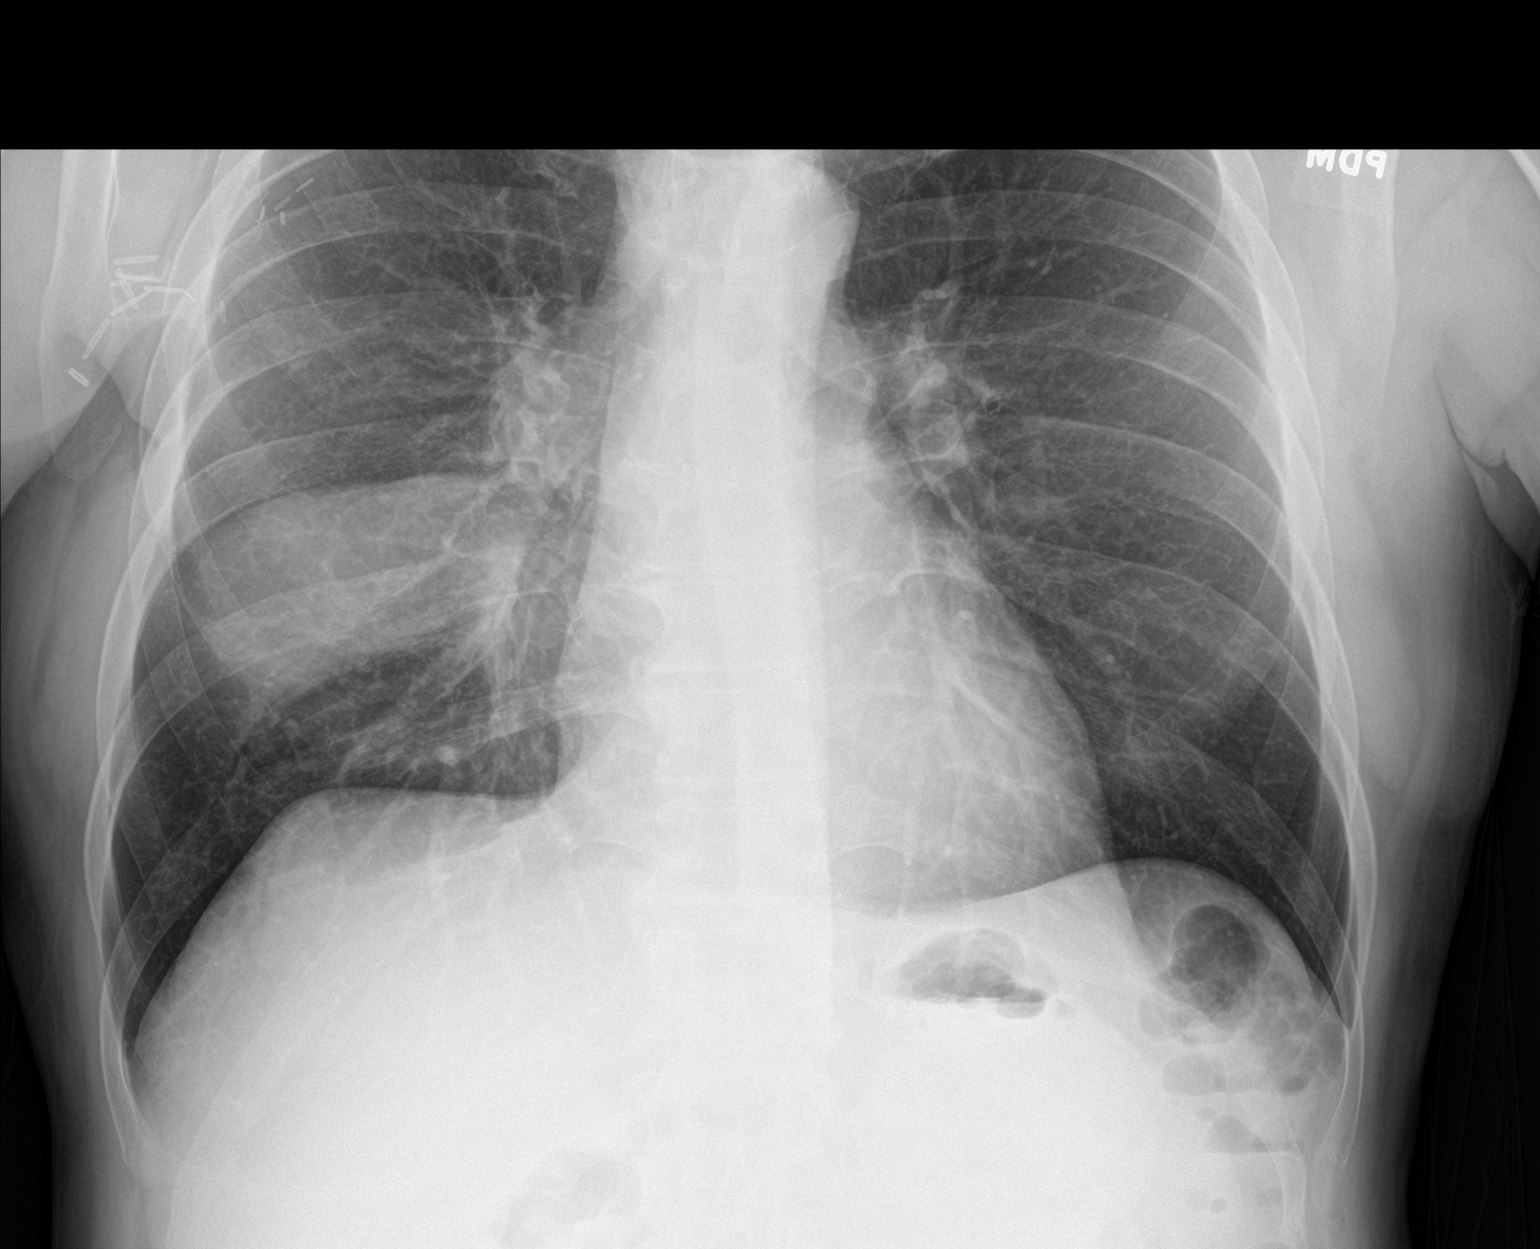

[chest lat]
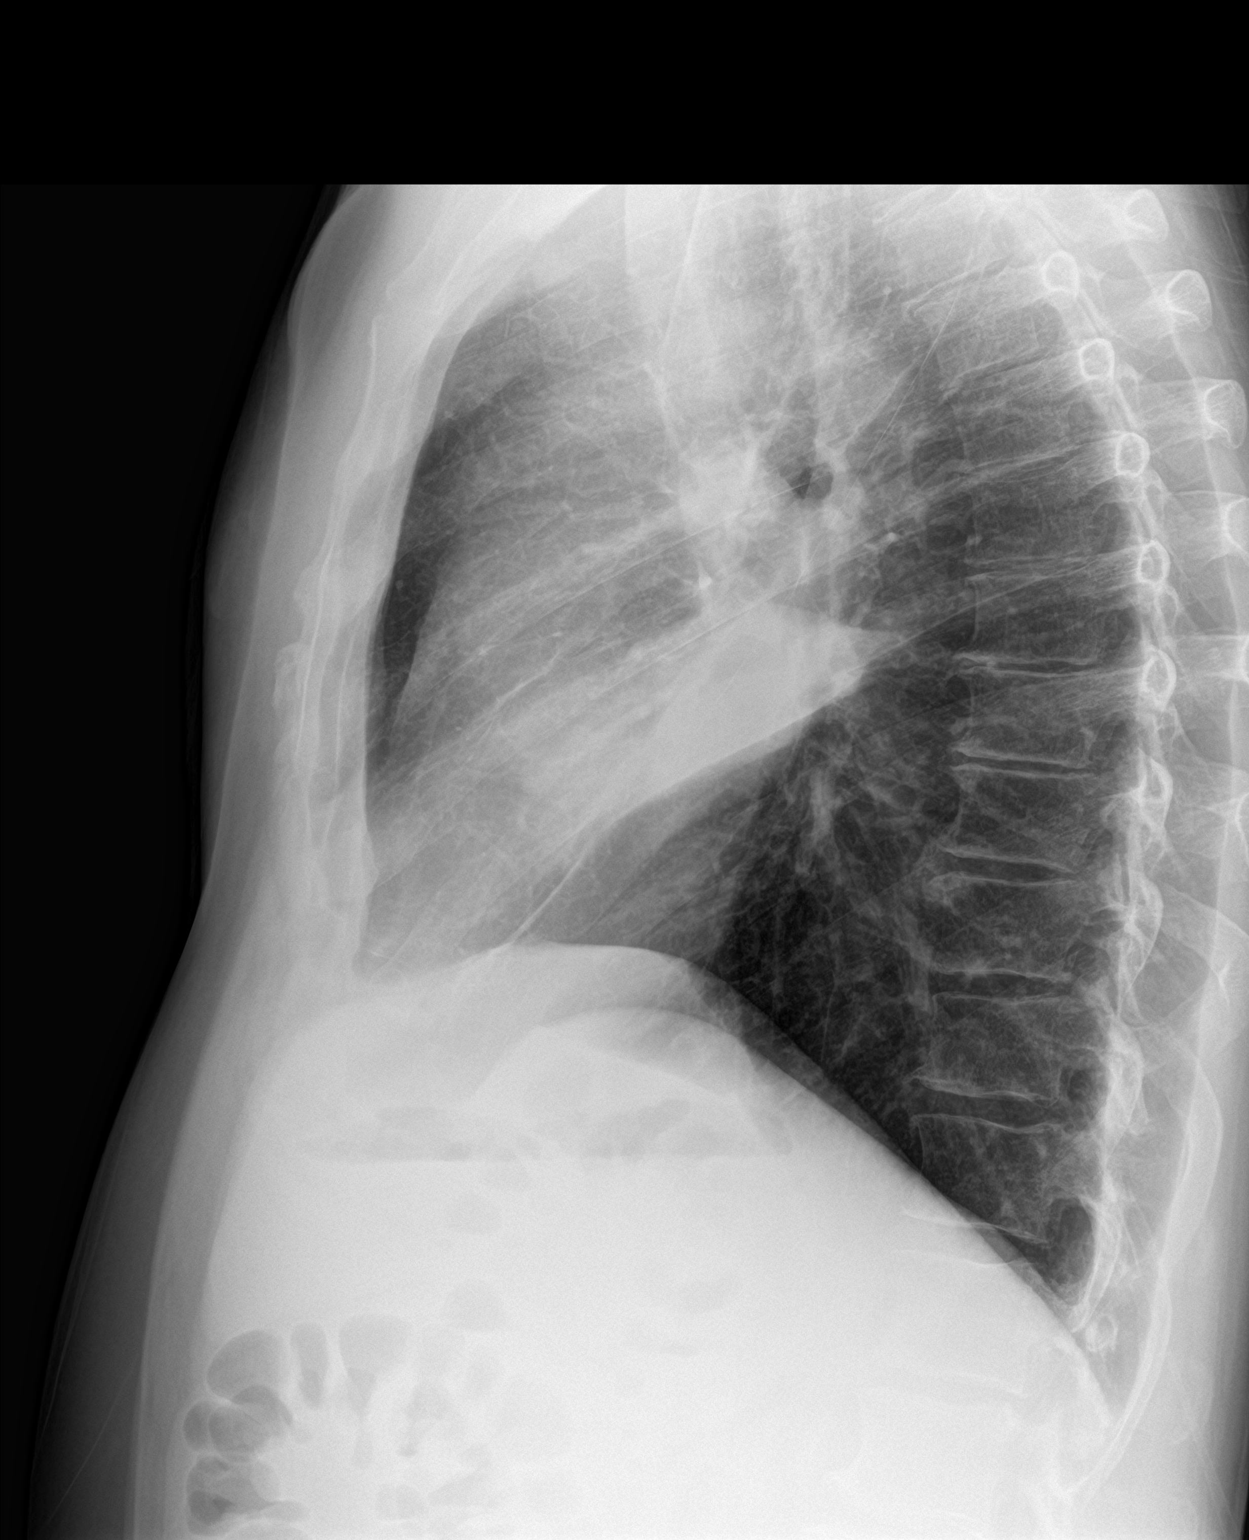

[3 of 3 positions shown; findings below may reference images not displayed]

FINDINGS: Cardiomediastinal silhouette within normal limits in size and
contour. No evidence of central vascular congestion.

Confluent airspace disease of the right middle lobe.

Surgical changes of the right chest wall.

No pneumothorax.

Left lung relatively well aerated.
IMPRESSION: Lobar pneumonia on the right. Followup PA and lateral chest X-ray is
recommended in 3-4 weeks following trial of antibiotic therapy to
ensure resolution and exclude underlying malignancy.

## 2021-08-11 MED ORDER — AMOXICILLIN-POT CLAVULANATE 875-125 MG PO TABS: 1.0000 | ORAL_TABLET | Freq: Two times a day (BID) | ORAL | 0 refills | Status: DC

## 2021-08-11 MED ORDER — AZITHROMYCIN 250 MG PO TABS: ORAL_TABLET | ORAL | 0 refills | Status: DC

## 2021-08-14 ENCOUNTER — Telehealth: Payer: Self-pay

## 2021-08-14 NOTE — Telephone Encounter (Signed)
Patient was seen on 08/10/21 and would like to know if pneumonia contagious to his 59 year old grandma and what are the restrictions when it comes to recovery can we please give  a patient a call back to answer his questions

## 2021-08-17 NOTE — Telephone Encounter (Signed)
Patient called in stating that he hasnt heard back from anyone. Gave message below, he states he originally wanted to know if he is okay to return to the gym and doing workouts or if he should wait until he is over pneumonia to return. As he went to the gym on Thursday completing a normal workout routine but the next day he was extremely fatigued.

## 2021-08-18 NOTE — Telephone Encounter (Signed)
Notified of message below patient voices understanding

## 2021-09-15 ENCOUNTER — Ambulatory Visit (INDEPENDENT_AMBULATORY_CARE_PROVIDER_SITE_OTHER): Payer: BC Managed Care – PPO | Admitting: Otolaryngology

## 2021-09-15 ENCOUNTER — Other Ambulatory Visit: Payer: Self-pay

## 2021-09-15 DIAGNOSIS — R490 Dysphonia: Secondary | ICD-10-CM | POA: Diagnosis not present

## 2021-09-15 DIAGNOSIS — J381 Polyp of vocal cord and larynx: Secondary | ICD-10-CM | POA: Diagnosis not present

## 2021-09-15 NOTE — Progress Notes (Signed)
HPI: Christopher Randall is a 60 y.o. male who presents is referred by his PCP for evaluation of hoarseness.  He denies a sore throat.  He has had some coughing and was diagnosed with pneumonia about 6 weeks ago in the right lower lobe.  He has a significant smoking history and used to smoke 2 packs/day and is trying to stop smoking altogether but presently smoking about half a pack a day. Patient also has a remote history of melanoma and a lymph node diagnosed in the right axilla several years ago with unknown primary.  This was treated at [facility] with interferon treatment and has had no evidence of recurrent melanoma since that time. He has chronically been hoarse with A raspy voice.  Past Medical History:  Diagnosis Date   Abnormal TSH 09/28/2017   Anxiety 07/03/2012   Depression    Difficulty sleeping 09/28/2017   Examination of participant in clinical trial 09/11/2012   History of immunotherapy 02/23/2016   Melanoma of skin 10/26/2011   Nicotine dependence 07/17/2015   Smoker    Past Surgical History:  Procedure Laterality Date   LEFT POPLITEAL MASS RESECTION  08/27/2011   pigmented villonodular synovitis, negative for malignancy   LYMPH GLAND EXCISION  07/12/2011   right axilla, consistent with melanoma   LYMPH NODE DISSECTION  08/27/2011   right, 0/17 nodes positive   Social History   Socioeconomic History   Marital status: Divorced    Spouse name: Not on file   Number of children: 3   Years of education: Not on file   Highest education level: Not on file  Occupational History    Employer: [employer]  Tobacco Use   Smoking status: Every Day    Packs/day: 0.25    Types: Cigarettes   Smokeless tobacco: Never  Substance and Sexual Activity   Alcohol use: Yes    Alcohol/week: 6.0 standard drinks    Types: 6 Shots of liquor per week   Drug use: No   Sexual activity: Not on file  Other Topics Concern   Not on file  Social History Narrative   Not on file   Social Determinants  of Health   Financial Resource Strain: Not on file  Food Insecurity: Not on file  Transportation Needs: Not on file  Physical Activity: Not on file  Stress: Not on file  Social Connections: Not on file   Family History  Problem Relation Age of Onset   Hypertension Mother    CAD Mother    Lung cancer Father    Allergies  Allergen Reactions   Iopamidol Hives and Itching    Isovue break through with prep, Hives and Itching  Patient had PET?CT scan with 125 mls of Isovue-300. Noted to have two raised itchy hives which self-resolved. Patient had PET/CT scan with contrast on 05/27/2014 while on prednisone prep (3 doses of 50 mg-13 hour prep) and noted to have two hives which self -resolved without treatment   Iodinated Diagnostic Agents Rash   Iodine-131 Rash   Prior to Admission medications   Medication Sig Start Date End Date Taking? Authorizing Provider  amoxicillin-clavulanate (AUGMENTIN) 875-125 MG tablet Take 1 tablet by mouth 2 (two) times daily. 08/11/21   Allwardt, Randa Evens, PA-C  azithromycin (ZITHROMAX Z-PAK) 250 MG tablet Take two tablets on day one, and then one tablet daily the next four days. 08/11/21   Allwardt, Randa Evens, PA-C  Diclofenac Sodium 1.5 % SOLN Place one application on to the skin  2x daily. Patient taking differently: Place one application on to the skin 2x daily as needed. 01/15/19   Briscoe Deutscher, DO  LORazepam (ATIVAN) 1 MG tablet Take 1 tablet (1 mg total) by mouth 2 (two) times daily as needed for anxiety. 01/15/19   Briscoe Deutscher, DO     Positive ROS: Otherwise negative  All other systems have been reviewed and were otherwise negative with the exception of those mentioned in the HPI and as above.  Physical Exam: Constitutional: Alert, well-appearing, no acute distress.  He has mild hoarseness. Ears: External ears without lesions or tenderness. Ear canals are clear bilaterally with intact, clear TMs.  Nasal: External nose without lesions. Septum with  minimal deformity and mild rhinitis.  Both middle meatus regions were clear.  No signs of infection..  No polyps. Oral: Lips and gums without lesions. Tongue and palate mucosa without lesions. Posterior oropharynx clear.  Indirect laryngoscopy revealed a clear base of tongue vallecula and epiglottis.  The epiglottis extended posteriorly making visualization of the vocal cords difficult.  Patient also has a strong gag reflex. Fiberoptic laryngoscopy was performed through the right nostril.  The nasopharynx was clear.  The base of tongue vallecula and epiglottis were normal.  On examination the vocal cords patient had polypoid changes or Renke's edema of both vocal cords with normal vocal mobility.  No clinical evidence of neoplasia. Neck: No palpable adenopathy or masses.  No significant palpable adenopathy on either side and it Respiratory: Breathing comfortably  Skin: No facial/neck lesions or rash noted.  Laryngoscopy  Date/Time: 09/15/2021 4:51 PM Performed by: Rozetta Nunnery, MD Authorized by: Rozetta Nunnery, MD   Consent:    Consent obtained:  Verbal   Consent given by:  Patient Procedure details:    Indications: hoarseness, dysphagia, or aspiration     Medication:  Afrin Sinus:    Right nasopharynx: normal   Mouth:    Oropharynx: normal     Vallecula: normal     Base of tongue: normal     Epiglottis: normal   Throat:    Pyriform sinus: normal     True vocal cords: normal   Comments:     On fiberoptic laryngoscopy both vocal cords have polypoid changes or Renke's edema secondary to chronic smoking.  Assessment: Discussed with patient concerning polypoid changes of both vocal cords secondary to smoking.  Plan: Again encouraged him to try to stop smoking altogether and the vocal cord edema should improve.  But would not recommend removal of the polypoid changes unless he stopped smoking.    Radene Journey, MD   CC:

## 2021-10-06 ENCOUNTER — Emergency Department (HOSPITAL_BASED_OUTPATIENT_CLINIC_OR_DEPARTMENT_OTHER): Payer: BC Managed Care – PPO

## 2021-10-06 ENCOUNTER — Observation Stay (HOSPITAL_COMMUNITY): Payer: BC Managed Care – PPO

## 2021-10-06 ENCOUNTER — Other Ambulatory Visit: Payer: Self-pay

## 2021-10-06 ENCOUNTER — Inpatient Hospital Stay (HOSPITAL_BASED_OUTPATIENT_CLINIC_OR_DEPARTMENT_OTHER)
Admission: EM | Admit: 2021-10-06 | Discharge: 2021-10-09 | DRG: 054 | Disposition: A | Discharge status: Home / Self Care | Payer: BC Managed Care – PPO | Attending: Internal Medicine | Admitting: Internal Medicine

## 2021-10-06 ENCOUNTER — Encounter (HOSPITAL_BASED_OUTPATIENT_CLINIC_OR_DEPARTMENT_OTHER): Payer: Self-pay | Admitting: *Deleted

## 2021-10-06 DIAGNOSIS — Z888 Allergy status to other drugs, medicaments and biological substances status: Secondary | ICD-10-CM | POA: Diagnosis not present

## 2021-10-06 DIAGNOSIS — R739 Hyperglycemia, unspecified: Secondary | ICD-10-CM | POA: Diagnosis not present

## 2021-10-06 DIAGNOSIS — C342 Malignant neoplasm of middle lobe, bronchus or lung: Secondary | ICD-10-CM | POA: Diagnosis present

## 2021-10-06 DIAGNOSIS — F17210 Nicotine dependence, cigarettes, uncomplicated: Secondary | ICD-10-CM | POA: Diagnosis present

## 2021-10-06 DIAGNOSIS — R531 Weakness: Secondary | ICD-10-CM

## 2021-10-06 DIAGNOSIS — C3490 Malignant neoplasm of unspecified part of unspecified bronchus or lung: Secondary | ICD-10-CM

## 2021-10-06 DIAGNOSIS — G936 Cerebral edema: Secondary | ICD-10-CM

## 2021-10-06 DIAGNOSIS — F419 Anxiety disorder, unspecified: Secondary | ICD-10-CM | POA: Diagnosis present

## 2021-10-06 DIAGNOSIS — Z801 Family history of malignant neoplasm of trachea, bronchus and lung: Secondary | ICD-10-CM

## 2021-10-06 DIAGNOSIS — Z85820 Personal history of malignant melanoma of skin: Secondary | ICD-10-CM

## 2021-10-06 DIAGNOSIS — Z91041 Radiographic dye allergy status: Secondary | ICD-10-CM | POA: Diagnosis not present

## 2021-10-06 DIAGNOSIS — T380X5A Adverse effect of glucocorticoids and synthetic analogues, initial encounter: Secondary | ICD-10-CM | POA: Diagnosis not present

## 2021-10-06 DIAGNOSIS — Z20822 Contact with and (suspected) exposure to covid-19: Secondary | ICD-10-CM | POA: Diagnosis present

## 2021-10-06 DIAGNOSIS — R918 Other nonspecific abnormal finding of lung field: Secondary | ICD-10-CM

## 2021-10-06 DIAGNOSIS — J9811 Atelectasis: Secondary | ICD-10-CM | POA: Diagnosis present

## 2021-10-06 DIAGNOSIS — J9690 Respiratory failure, unspecified, unspecified whether with hypoxia or hypercapnia: Secondary | ICD-10-CM

## 2021-10-06 DIAGNOSIS — C7931 Secondary malignant neoplasm of brain: Principal | ICD-10-CM

## 2021-10-06 DIAGNOSIS — Z79899 Other long term (current) drug therapy: Secondary | ICD-10-CM | POA: Diagnosis not present

## 2021-10-06 DIAGNOSIS — Z8249 Family history of ischemic heart disease and other diseases of the circulatory system: Secondary | ICD-10-CM

## 2021-10-06 LAB — CBC WITH DIFFERENTIAL/PLATELET
Abs Immature Granulocytes: 0.04 10*3/uL (ref 0.00–0.07)
Basophils Absolute: 0.1 10*3/uL (ref 0.0–0.1)
Basophils Relative: 1 %
Eosinophils Absolute: 0.5 10*3/uL (ref 0.0–0.5)
Eosinophils Relative: 4 %
HCT: 38 % — ABNORMAL LOW (ref 39.0–52.0)
Hemoglobin: 12.5 g/dL — ABNORMAL LOW (ref 13.0–17.0)
Immature Granulocytes: 0 %
Lymphocytes Relative: 23 %
Lymphs Abs: 2.5 10*3/uL (ref 0.7–4.0)
MCH: 30.6 pg (ref 26.0–34.0)
MCHC: 32.9 g/dL (ref 30.0–36.0)
MCV: 93.1 fL (ref 80.0–100.0)
Monocytes Absolute: 1.3 10*3/uL — ABNORMAL HIGH (ref 0.1–1.0)
Monocytes Relative: 12 %
Neutro Abs: 6.7 10*3/uL (ref 1.7–7.7)
Neutrophils Relative %: 60 %
Platelets: 458 10*3/uL — ABNORMAL HIGH (ref 150–400)
RBC: 4.08 MIL/uL — ABNORMAL LOW (ref 4.22–5.81)
RDW: 13.2 % (ref 11.5–15.5)
WBC: 11 10*3/uL — ABNORMAL HIGH (ref 4.0–10.5)
nRBC: 0 % (ref 0.0–0.2)

## 2021-10-06 LAB — COMPREHENSIVE METABOLIC PANEL
ALT: 27 U/L (ref 0–44)
AST: 21 U/L (ref 15–41)
Albumin: 3.6 g/dL (ref 3.5–5.0)
Alkaline Phosphatase: 57 U/L (ref 38–126)
Anion gap: 9 (ref 5–15)
BUN: 12 mg/dL (ref 6–20)
CO2: 27 mmol/L (ref 22–32)
Calcium: 9.1 mg/dL (ref 8.9–10.3)
Chloride: 101 mmol/L (ref 98–111)
Creatinine, Ser: 0.46 mg/dL — ABNORMAL LOW (ref 0.61–1.24)
GFR, Estimated: >60 mL/min (ref >60)
Glucose, Bld: 87 mg/dL (ref 70–99)
Potassium: 3.8 mmol/L (ref 3.5–5.1)
Sodium: 137 mmol/L (ref 135–145)
Total Bilirubin: 0.2 mg/dL — ABNORMAL LOW (ref 0.3–1.2)
Total Protein: 7 g/dL (ref 6.5–8.1)

## 2021-10-06 LAB — RESP PANEL BY RT-PCR (FLU A&B, COVID) ARPGX2
Influenza A by PCR: NEGATIVE
Influenza B by PCR: NEGATIVE
SARS Coronavirus 2 by RT PCR: NEGATIVE

## 2021-10-06 IMAGING — CT CT HEAD W/O CM
4 series · 16 of 47 positions shown, 18 images · non-contrast
Comparison: [DATE] head MRI report

CLINICAL DATA: Neuro deficit, acute, stroke suspected. Left sided
weakness. History of melanoma.

EXAM:
CT HEAD WITHOUT CONTRAST
TECHNIQUE: Contiguous axial images were obtained from the base of the skull
through the vertex without intravenous contrast.

[Series 2: head wo · axial · 0.45mm/px · z∈[+783,+903]mm · 7 of 34 slices shown, 9 images]
[im 5/34  brain]
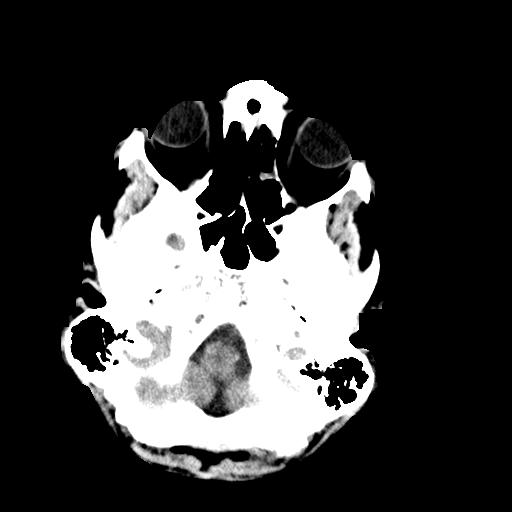
[im 5/34  bone]
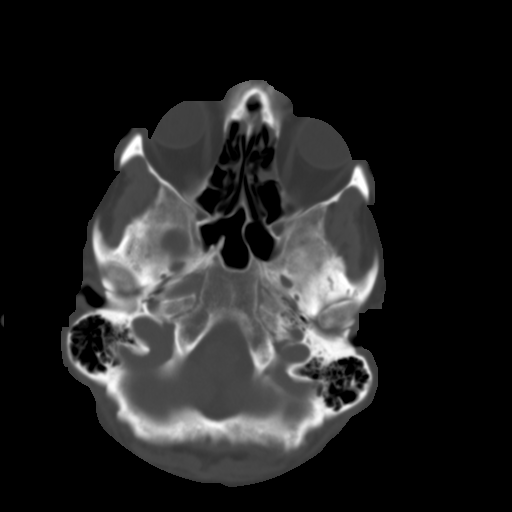
[im 9/34  brain]
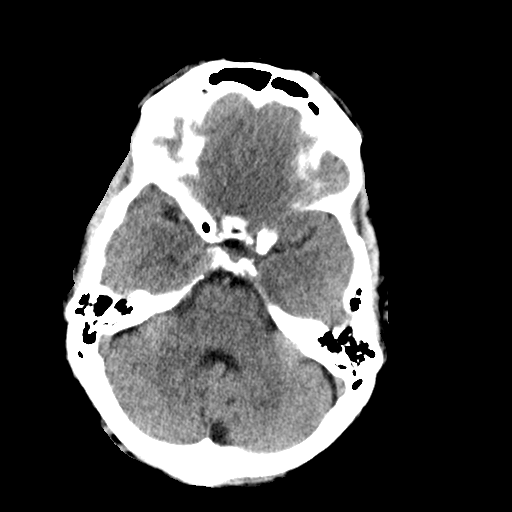
[im 13/34  brain]
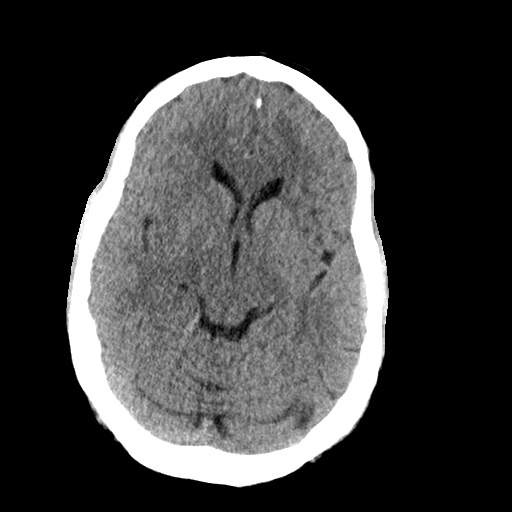
[im 17/34  brain]
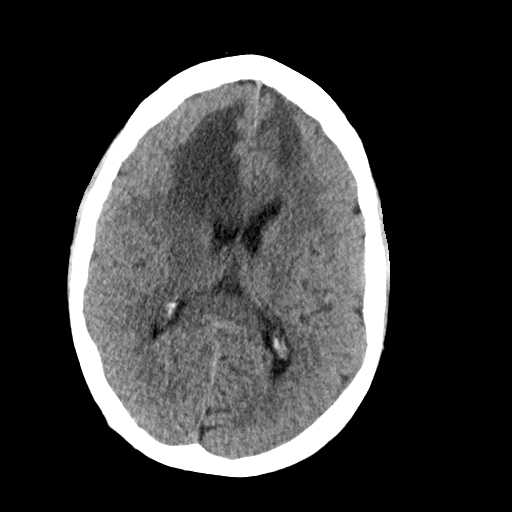
[im 21/34  brain]
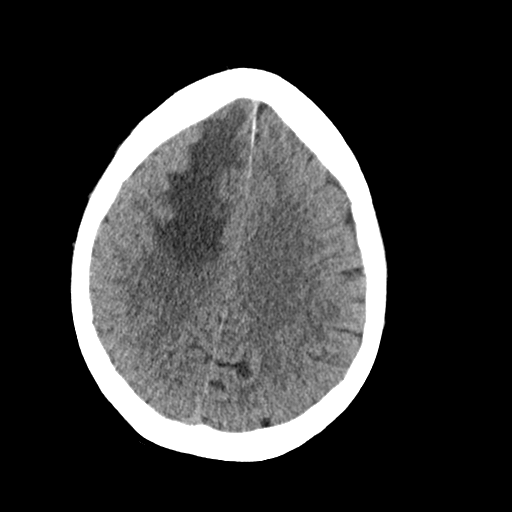
[im 21/34  bone]
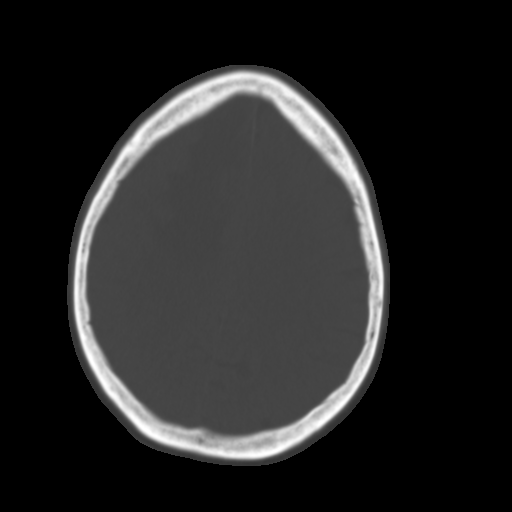
[im 25/34  brain]
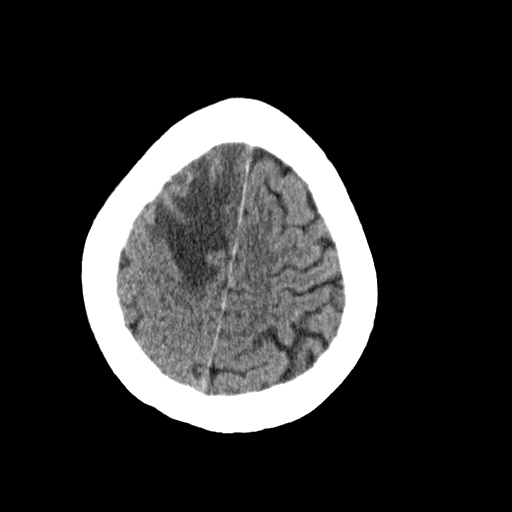
[im 29/34  brain]
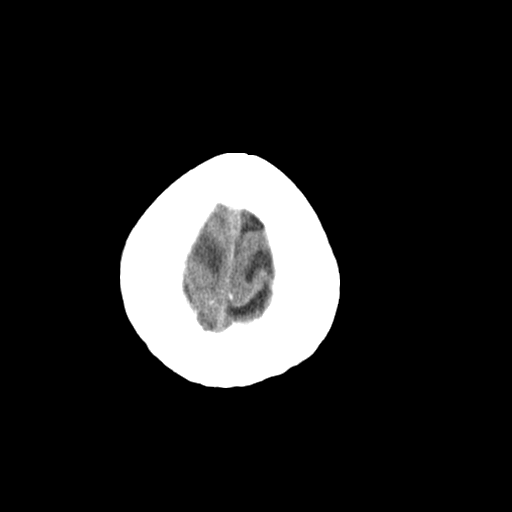

[Series 3: head bone · axial · 0.45mm/px · z∈[+779,+813]mm · 3 of 85 slices shown]
[im 9/85  bone]
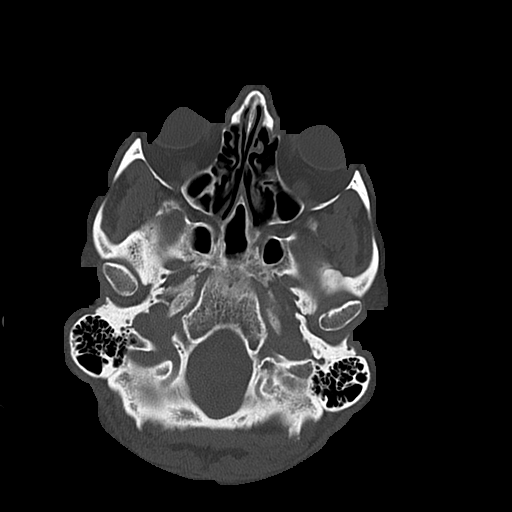
[im 17/85  bone]
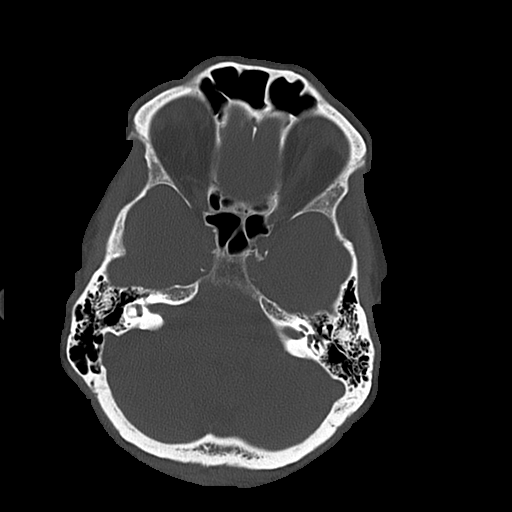
[im 26/85  bone]
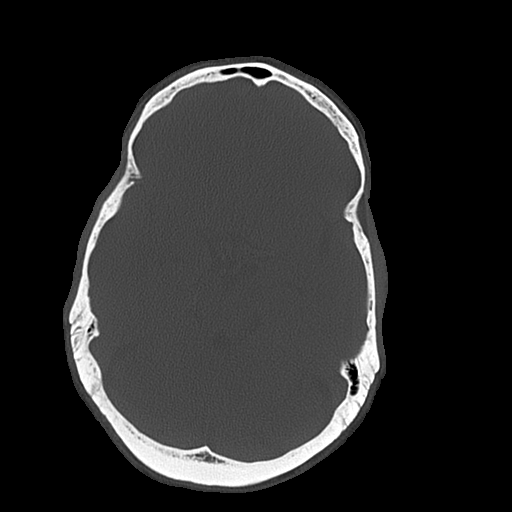

[Series 4: coronal soft · coronal · 0.33mm/px · 3 of 73 slices shown]
[im 25/73  brain]
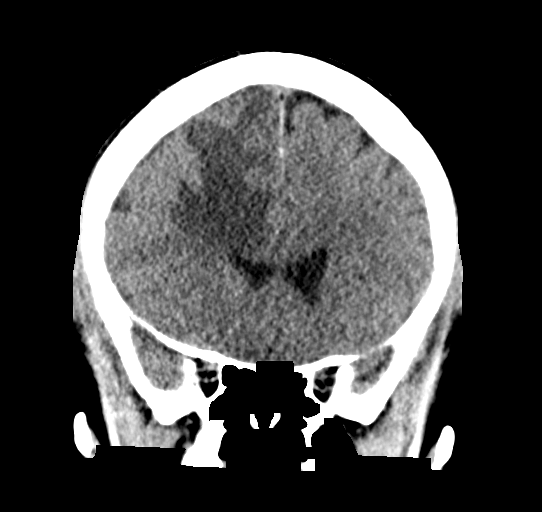
[im 33/73  brain]
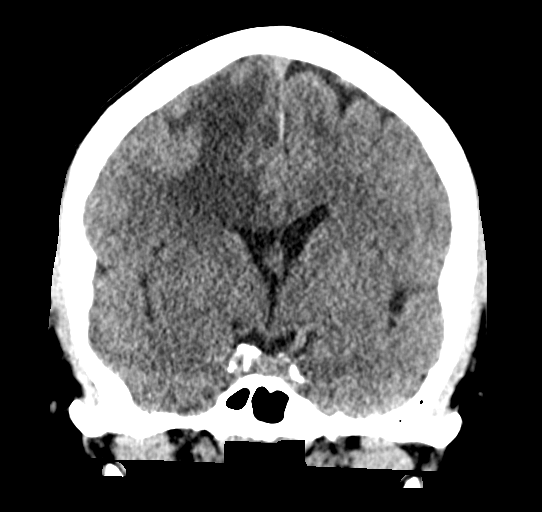
[im 41/73  brain]
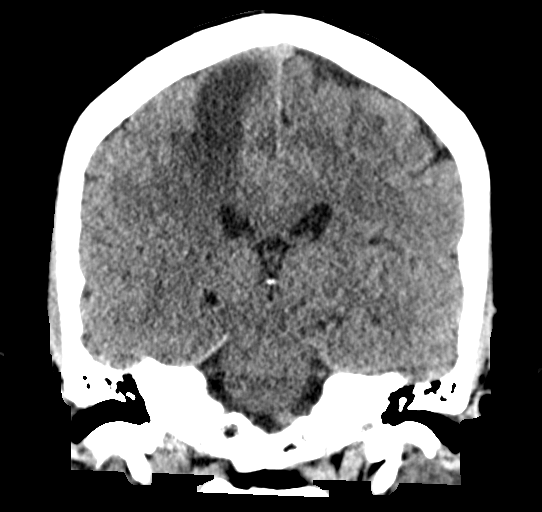

[Series 5: sagittal soft · sagittal · 0.33mm/px · 3 of 62 slices shown]
[im 21/62  brain]
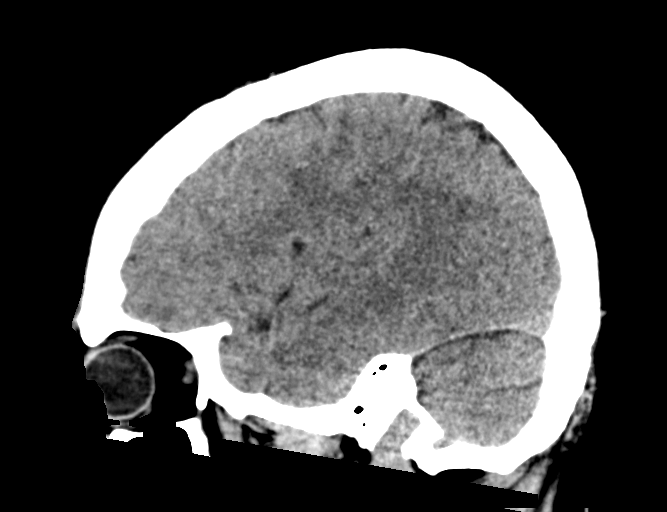
[im 31/62  brain]
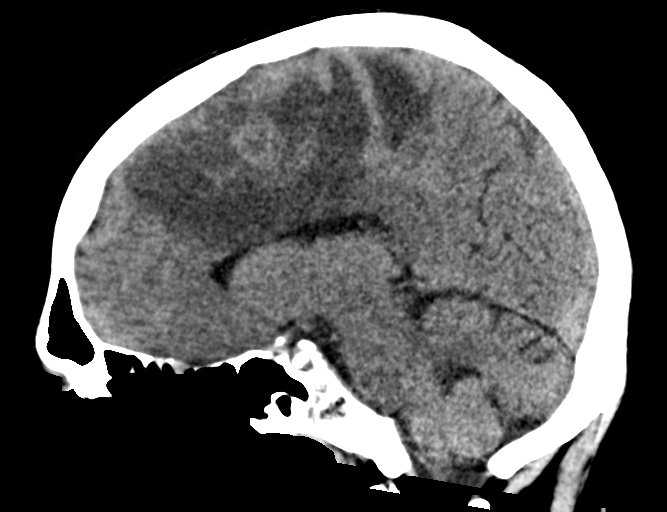
[im 41/62  brain]
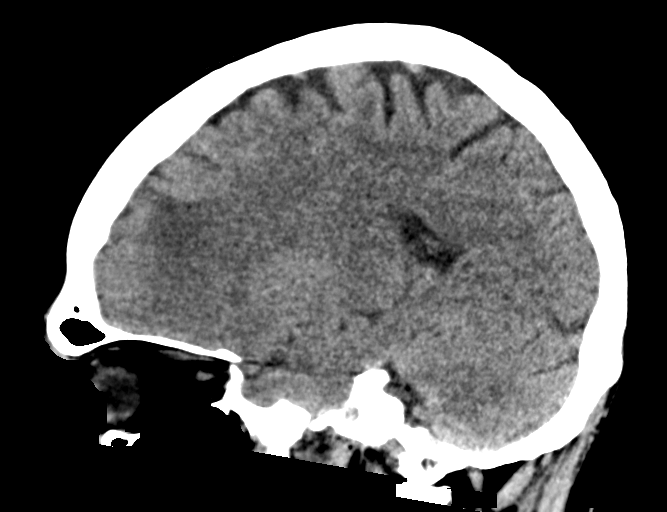

[16 of 47 positions shown; findings below may reference images not displayed]

FINDINGS: Brain: There is extensive vasogenic edema in the right frontal lobe
with milder edema anteriorly in the left frontal lobe. There is a
suspected 2 cm mass in the parasagittal right frontal lobe. Edema
results in mild mass effect on the frontal horn of the right lateral
ventricle. No intracranial hemorrhage, definite acute cortically
based infarct, significant midline shift, or extra-axial fluid
collection is identified.

Vascular: Calcified atherosclerosis at the skull base. No hyperdense
vessel.

Skull: No fracture or suspicious osseous lesion.

Sinuses/Orbits: Mild mucosal thickening in the paranasal sinuses.
Clear mastoid air cells. Unremarkable orbits.

Other: None.
IMPRESSION: Vasogenic edema in the frontal lobes, extensive on the right. This
is most concerning for metastatic disease, and a brain MRI without
and with contrast is recommended for further evaluation.

## 2021-10-06 IMAGING — DX DG CHEST 1V PORT
1 series · 2 of 2 positions shown · non-contrast
Comparison: [DATE]

CLINICAL DATA: Pain

EXAM:
PORTABLE CHEST 1 VIEW

[Series 1: chest · 0.14mm/px · 2 of 2 slices shown]
[im 1/2]
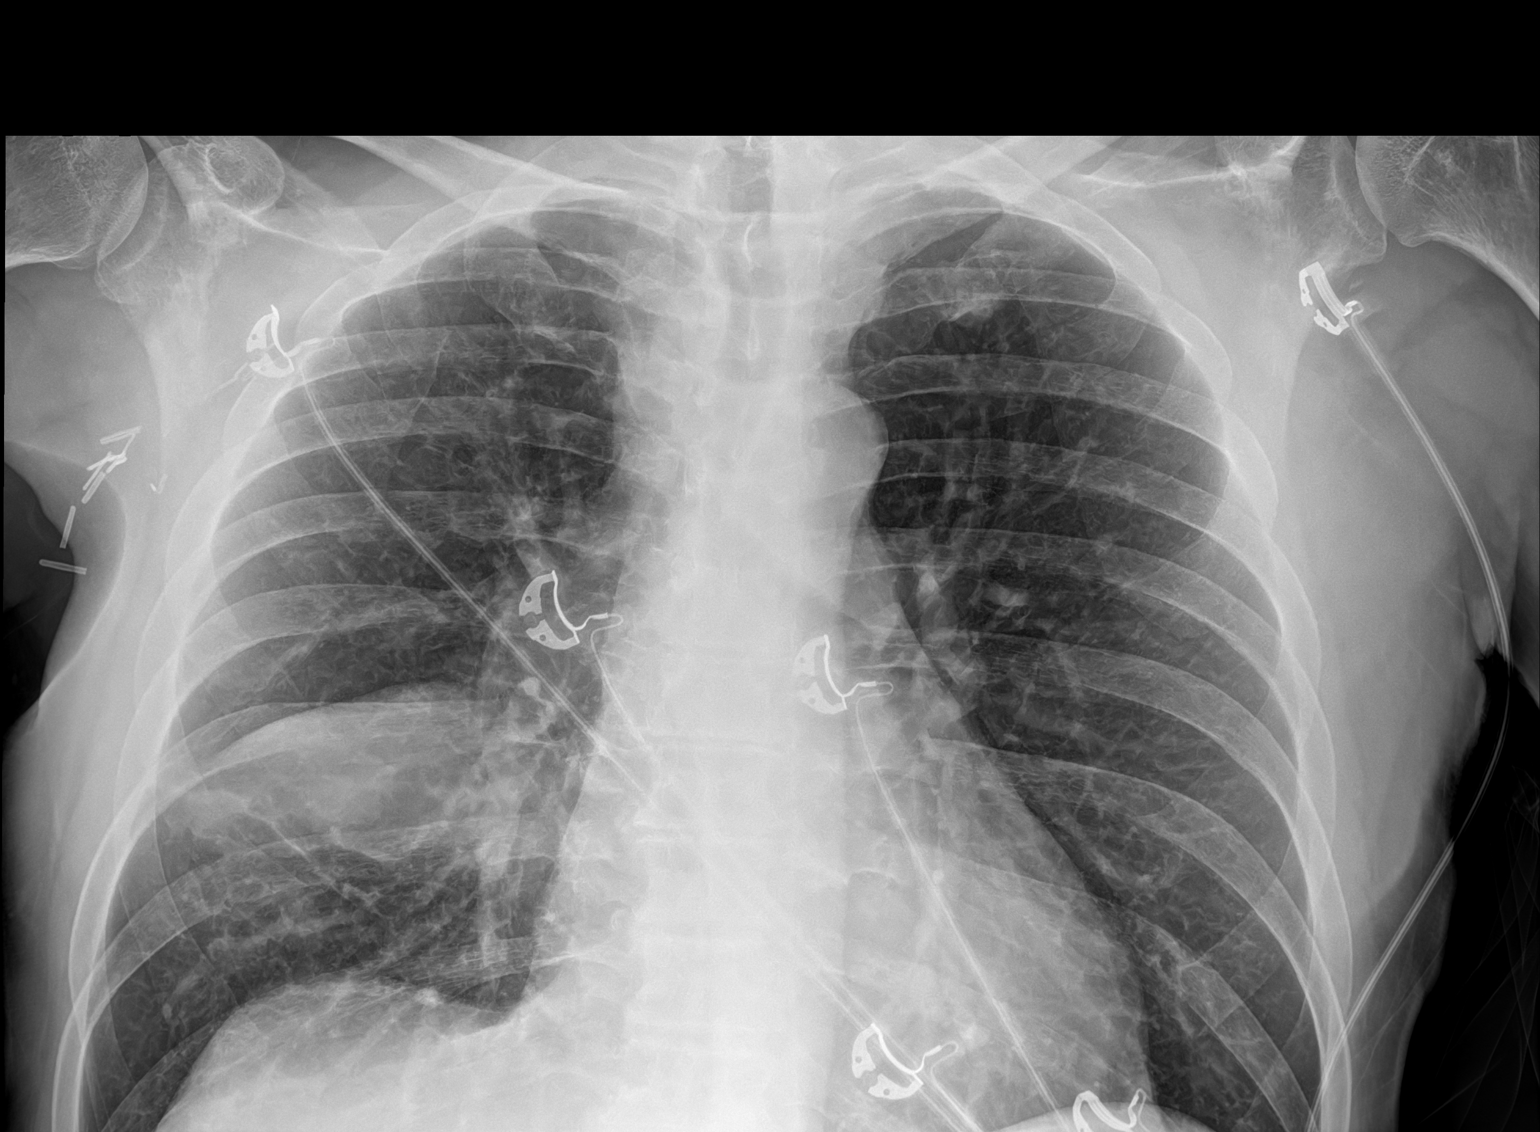
[im 2/2]
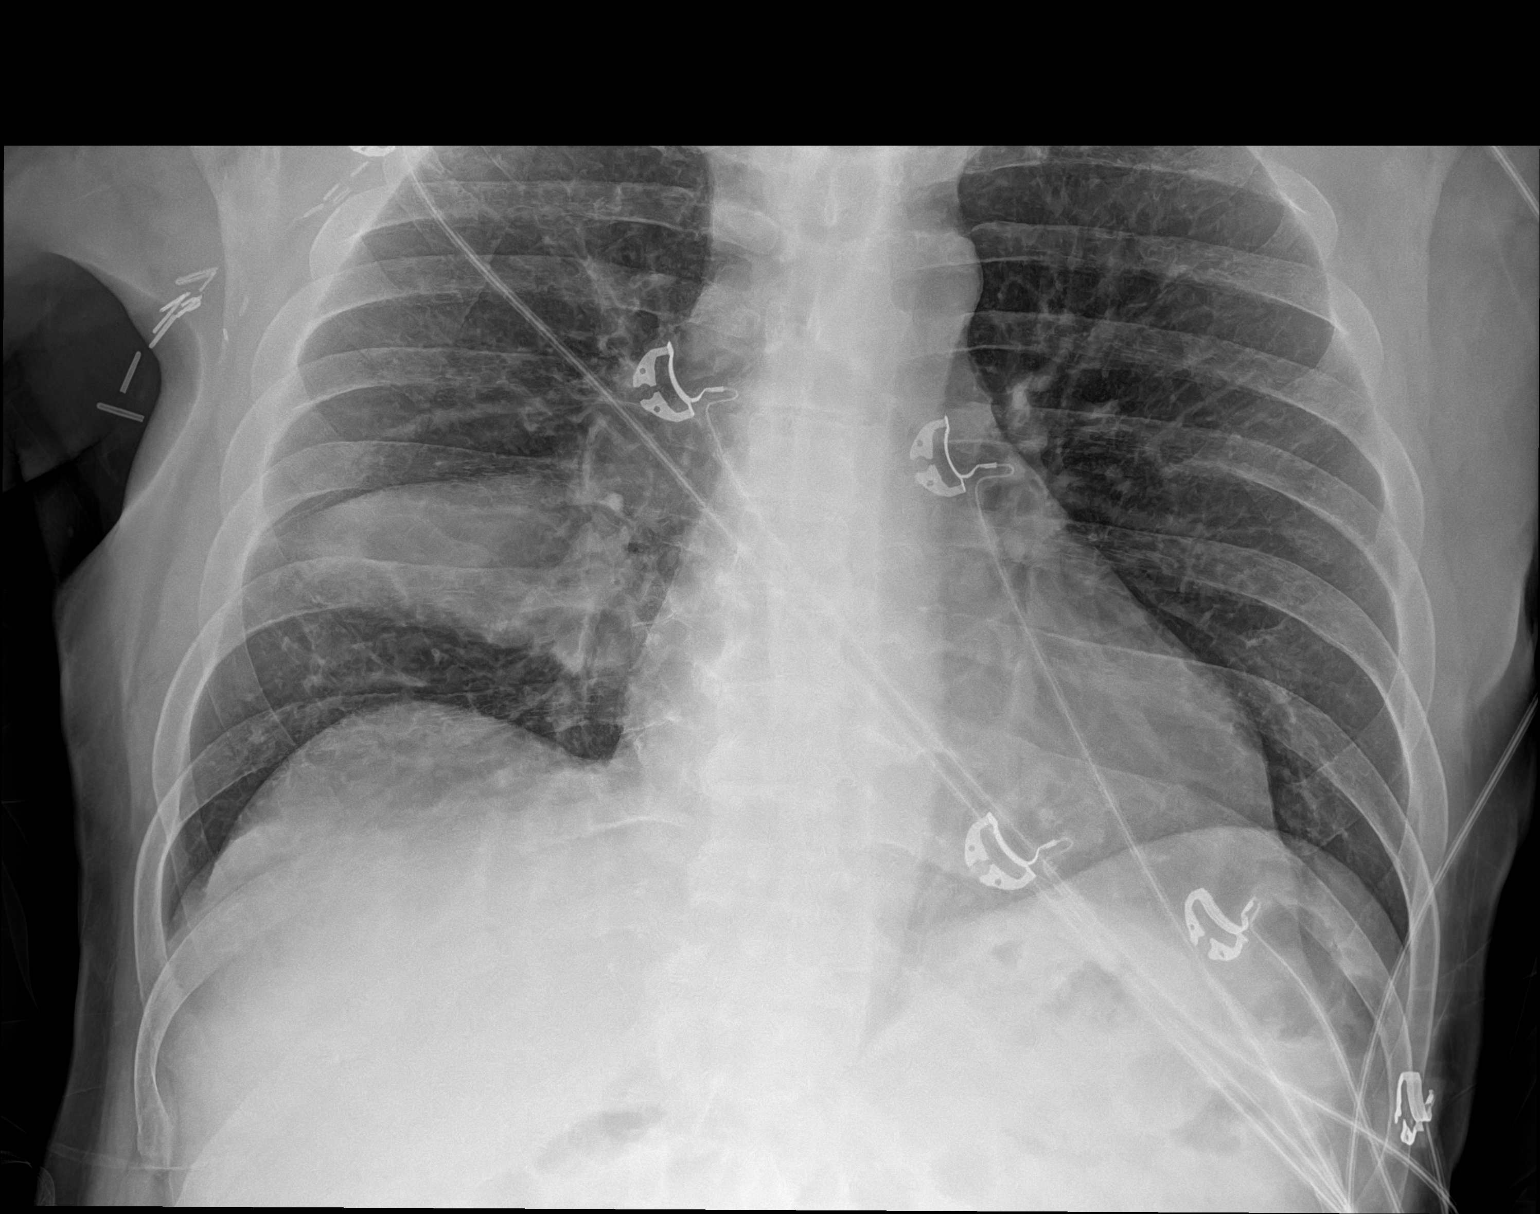

[2 of 2 positions shown; findings below may reference images not displayed]

FINDINGS: Persistent right middle lobe opacity no pleural effusion. Stable
cardiomediastinal silhouette.
IMPRESSION: Persistent right middle lobe opacity, given lack of interval
clearing, recommend CT chest for further evaluation.

## 2021-10-06 IMAGING — US US EXTREM LOW VENOUS*L*
1 series · 14 of 24 positions shown · non-contrast
Comparison: None.

CLINICAL DATA: Pain.

EXAM:
Left LOWER EXTREMITY VENOUS DOPPLER ULTRASOUND
TECHNIQUE: Gray-scale sonography with compression, as well as color and duplex
ultrasound, were performed to evaluate the deep venous system(s)
from the level of the common femoral vein through the popliteal and
proximal calf veins.

[Series 1: us venous img lower uni left (dvt) · portal-venous · 36 acquisitions, 14 frames shown]
[im 1/36]
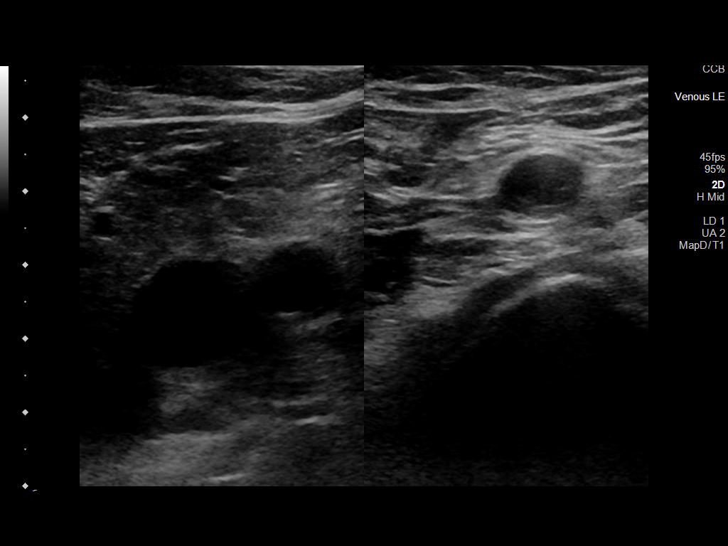
[im 4/36]
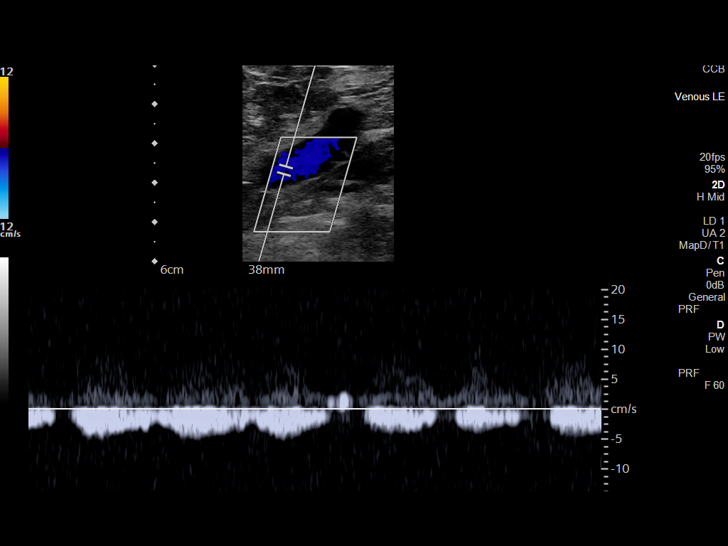
[im 8/36]
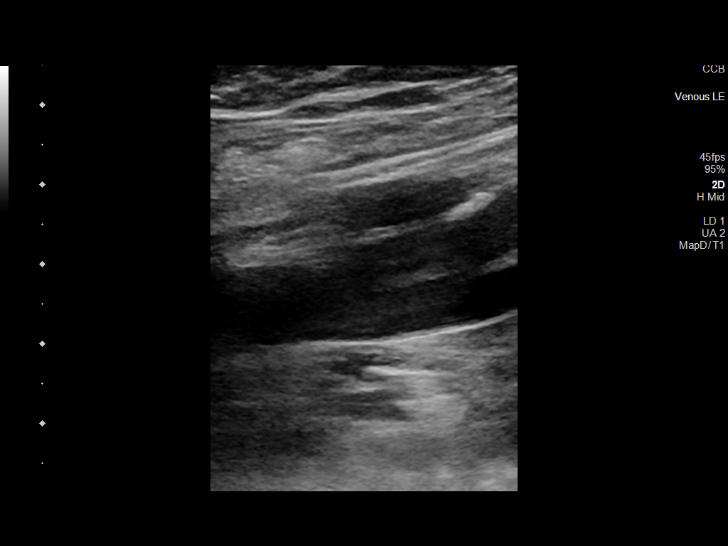
[im 11/36]
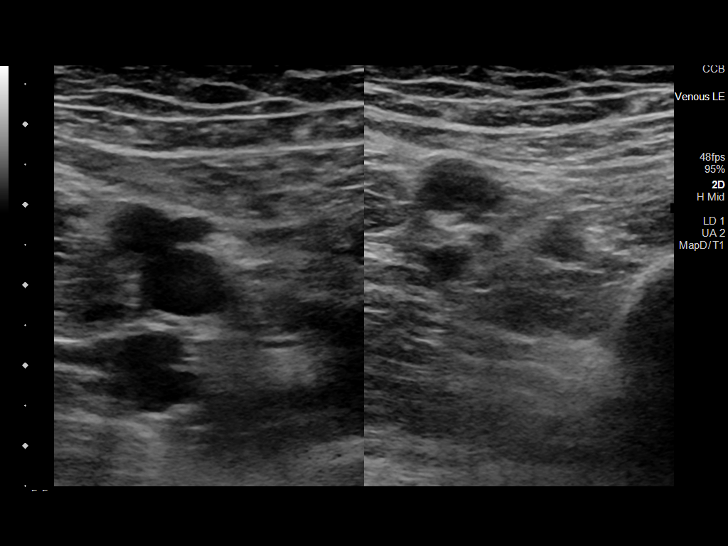
[im 13/36]
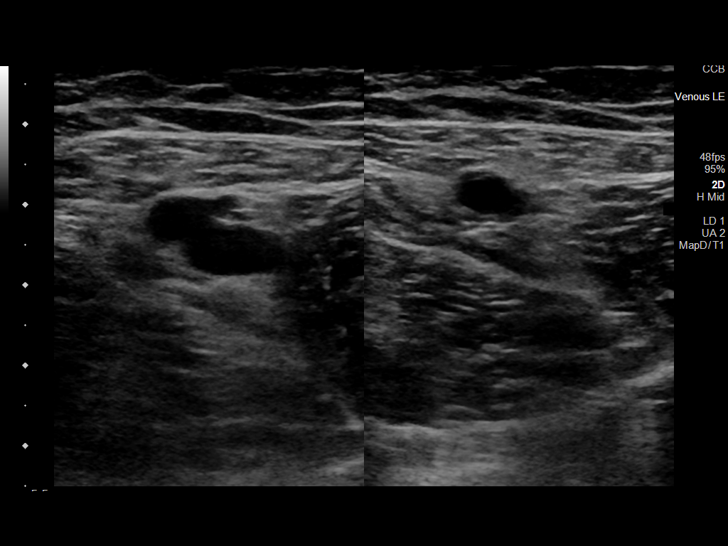
[im 16/36]
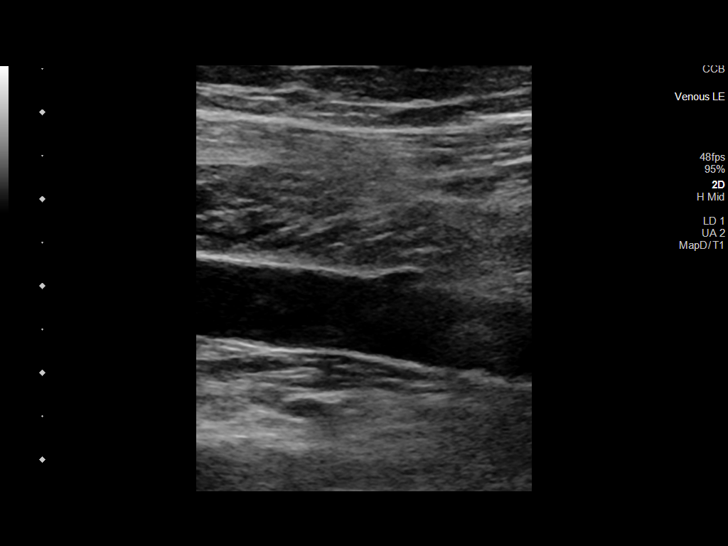
[im 19/36]
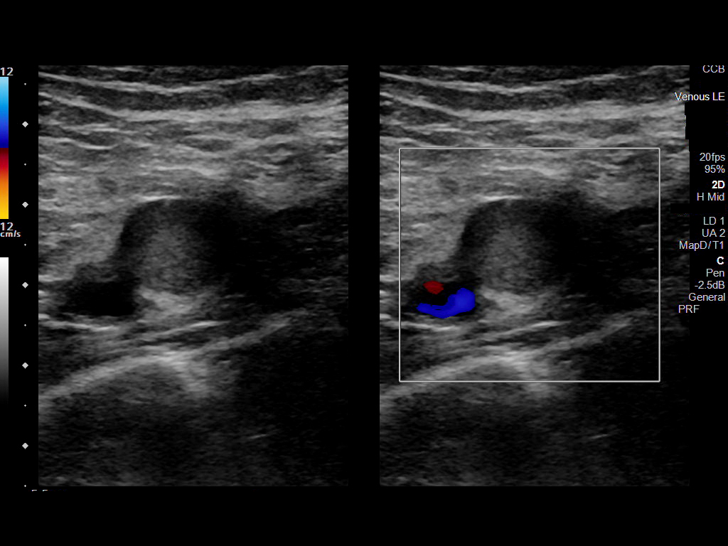
[im 20/36]
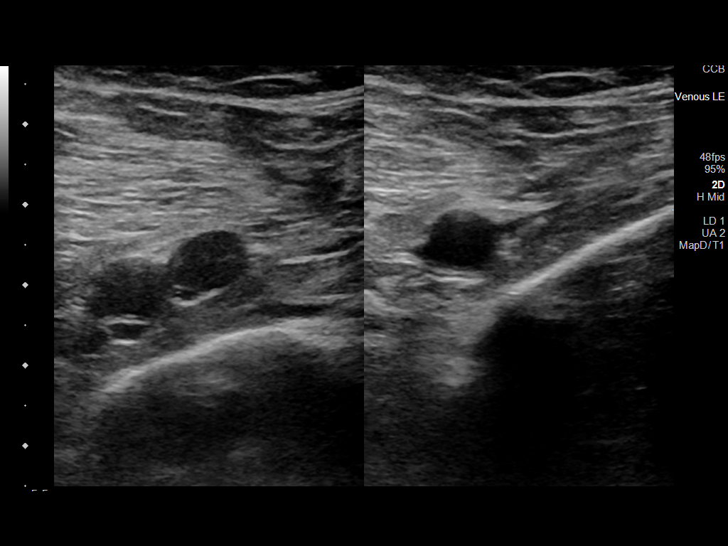
[im 23/36]
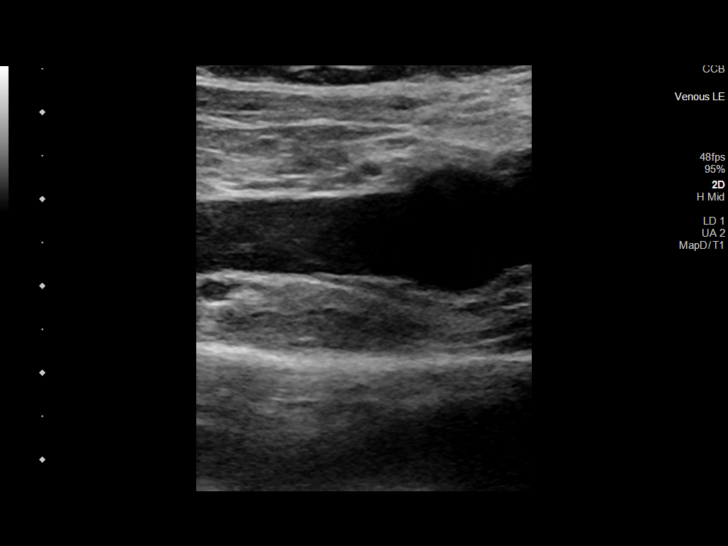
[im 26/36]
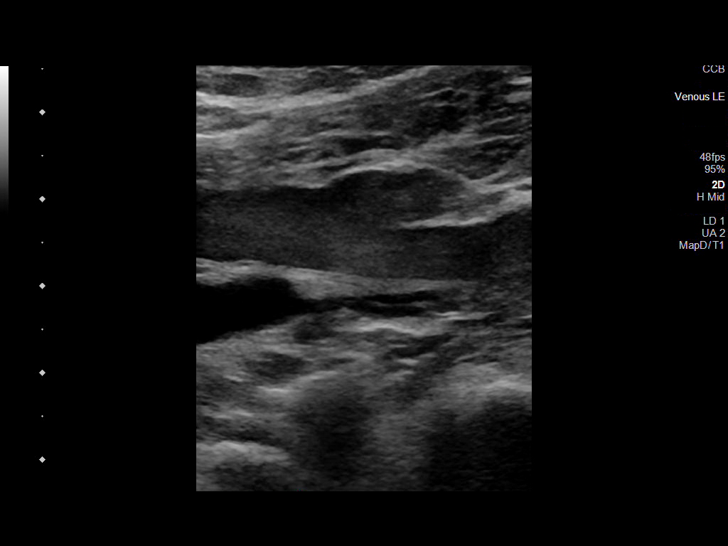
[im 28/36]
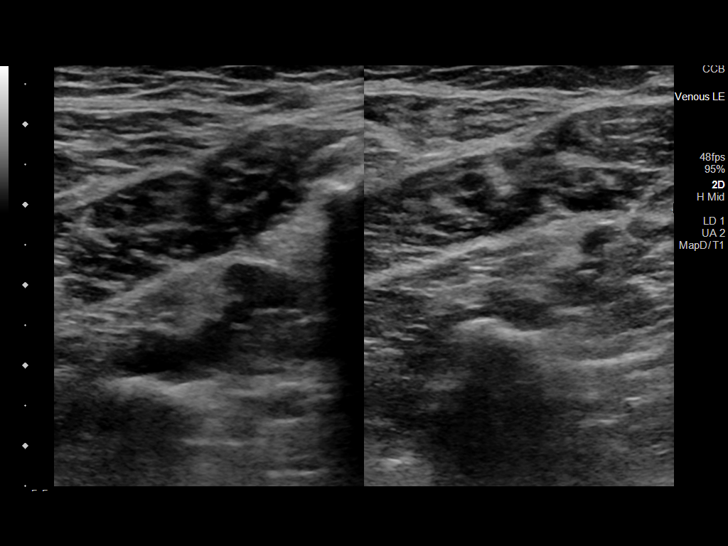
[im 29/36]
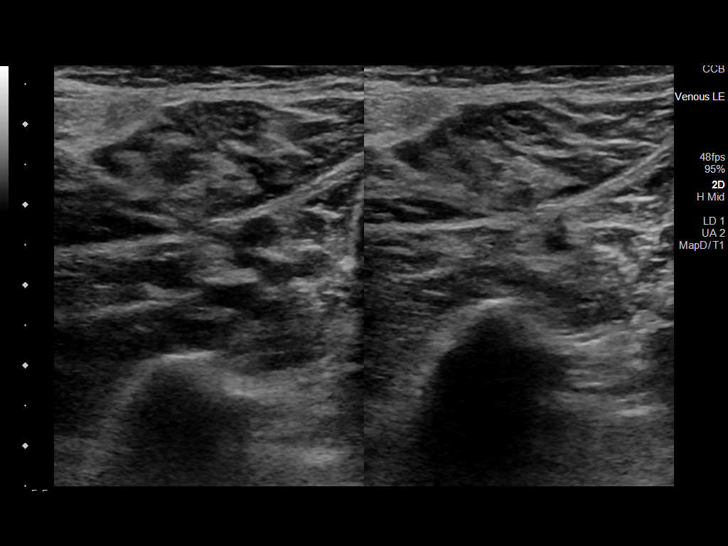
[im 32/36]
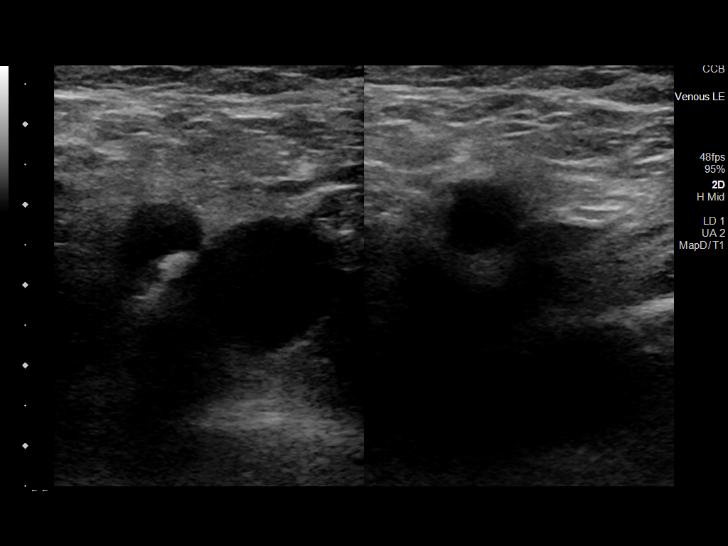
[im 36/36]
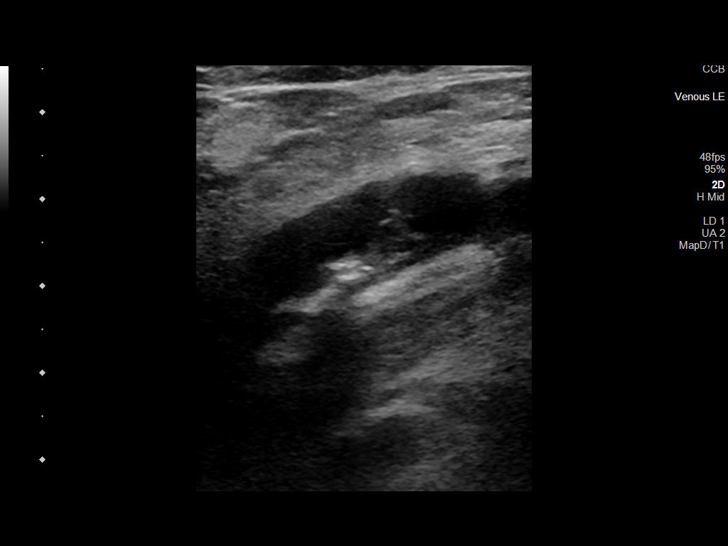

[14 of 24 positions shown; findings below may reference images not displayed]

FINDINGS: VENOUS

Normal compressibility of the common femoral, superficial femoral,
and popliteal veins, as well as the visualized calf veins.
Visualized portions of profunda femoral vein and great saphenous
vein unremarkable. No filling defects to suggest DVT on grayscale or
color Doppler imaging. Doppler waveforms show normal direction of
venous flow, normal respiratory plasticity and response to
augmentation.

Limited views of the contralateral common femoral vein are
unremarkable.

OTHER

Slow flow noted within the left popliteal vein of uncertain
etiology.

Limitations: none
IMPRESSION: No evidence for deep venous thrombosis in the left lower extremity.

## 2021-10-06 MED ORDER — NICOTINE 21 MG/24HR TD PT24
21.0000 mg | MEDICATED_PATCH | Freq: Once | TRANSDERMAL | Status: DC
  Filled 2021-10-06: qty 1

## 2021-10-06 MED ORDER — GADOBUTROL 1 MMOL/ML IV SOLN
8.5000 mL | Freq: Once | INTRAVENOUS | Status: AC | PRN
  Administered 2021-10-06: 8.5 mL via INTRAVENOUS

## 2021-10-06 MED ORDER — DEXAMETHASONE SODIUM PHOSPHATE 10 MG/ML IJ SOLN
10.0000 mg | Freq: Once | INTRAMUSCULAR | Status: AC
  Administered 2021-10-06: 10 mg via INTRAVENOUS
  Filled 2021-10-06: qty 1

## 2021-10-06 NOTE — Plan of Care (Signed)
[facility] will assume care on arrival to accepting facility. Until arrival, care as per EDP. However, [facility] available 24/7 for questions and assistance.  Nursing staff please page [facility] Admits and Consults ([phone]) as soon as the patient arrives the hospital.   

## 2021-10-06 NOTE — ED Provider Notes (Signed)
[facility]ERG[facility] DEPT Provider Note   CSN: [id number] Arrival date & time: 10/06/21  1623     History Chief Complaint  Patient presents with   Leg Pain    Christopher Randall is a 60 y.o. male.  HPI     60yo male with history of melanoma, vocal cord polyps, nicotine dependence, left popliteal mass resection, pigmented villonodular synovitis, abnormal TSH, presents with concern for left-sided weakness.   Reports having back pain after lifting a trailer 3 to 4 weeks ago, and early Saturday morning woke up and was unable to get out of bed due to left leg weakness.  Initially felt that it was his foot not cooperating.  Today, his wife is concerned that he was walking differently and holding his left arm closer to his body, and brought him to the emergency department.   Lifted trailer and hurt back about 3-4 weeks ago, saw chiropractor around 10/7 Have been goin gto the gym, doing recomb bike and walking Over the last week Coughing a lot, trying to quit smoking Early Saturday morning, tried to get out of bed and left foot not cooperating, struggling to get to bathroom, struggling to walk since then, dragging leg, moments of lack of coordination, felt like going to fall Muscle cramping in left calf, soreness   Past Medical History:  Diagnosis Date   Abnormal TSH 09/28/2017   Anxiety 07/03/2012   Depression    Difficulty sleeping 09/28/2017   Examination of participant in clinical trial 09/11/2012   History of immunotherapy 02/23/2016   Melanoma of skin 10/26/2011   Nicotine dependence 07/17/2015   Smoker     Patient Active Problem List   Diagnosis Date Noted   Brain metastasis ([place]) 10/06/2021   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   Insomnia 09/28/2017   Right elbow pain 09/28/2017   Left knee pain 09/28/2017   History of immunotherapy 02/23/2016   Nicotine dependence 07/17/2015   Examination of participant in clinical trial 09/11/2012   Anxiety, takes Ativan  prn, mostly at night 07/03/2012   Malignant melanoma ([place]) 10/26/2011    Past Surgical History:  Procedure Laterality Date   LEFT POPLITEAL MASS RESECTION  08/27/2011   pigmented villonodular synovitis, negative for malignancy   LYMPH GLAND EXCISION  07/12/2011   right axilla, consistent with melanoma   LYMPH NODE DISSECTION  08/27/2011   right, 0/17 nodes positive       Family History  Problem Relation Age of Onset   Hypertension Mother    CAD Mother    Lung cancer Father     Social History   Tobacco Use   Smoking status: Every Day    Packs/day: 0.25    Types: Cigarettes   Smokeless tobacco: Never  Vaping Use   Vaping Use: Never used  Substance Use Topics   Alcohol use: Yes    Alcohol/week: 6.0 standard drinks    Types: 6 Shots of liquor per week   Drug use: No    Home Medications Prior to Admission medications   Medication Sig Start Date End Date Taking? Authorizing Provider  Diclofenac Sodium 1.5 % SOLN Place one application on to the skin 2x daily. Patient taking differently: Place one application on to the skin twice a day as needed for muscle cramps or aches 01/15/19  Yes Briscoe Deutscher, DO  Multiple Vitamins-Minerals (ICAPS AREDS 2 PO) Take 1 capsule by mouth daily.   Yes [provider]  nicotine polacrilex (COMMIT) 4 MG lozenge Take 4  mg by mouth 2 (two) times daily as needed for smoking cessation.   Yes [provider]  OVER THE COUNTER MEDICATION Natural Calm Magnesium Powder: Dissolve 1 teaspoon into 8 oz beverage and drink daily   Yes [provider]  amoxicillin-clavulanate (AUGMENTIN) 875-125 MG tablet Take 1 tablet by mouth 2 (two) times daily. Patient not taking: Reported on 10/06/2021 08/11/21   Allwardt, Randa Evens, PA-C  azithromycin (ZITHROMAX Z-PAK) 250 MG tablet Take two tablets on day one, and then one tablet daily the next four days. Patient not taking: Reported on 10/06/2021 08/11/21   Allwardt, Alyssa M, PA-C  LORazepam  (ATIVAN) 1 MG tablet Take 1 tablet (1 mg total) by mouth 2 (two) times daily as needed for anxiety. Patient not taking: Reported on 10/06/2021 01/15/19   Briscoe Deutscher, DO    Allergies    Iopamidol, Iodinated diagnostic agents, and Iodine-131  Review of Systems   Review of Systems  Constitutional:  Negative for fever.  HENT:  Negative for sore throat.   Eyes:  Negative for visual disturbance.  Respiratory:  Positive for cough. Negative for shortness of breath.   Cardiovascular:  Negative for chest pain.  Gastrointestinal:  Negative for abdominal pain, nausea and vomiting.  Genitourinary:  Negative for difficulty urinating.  Musculoskeletal:  Positive for back pain (now improved). Negative for neck stiffness.  Skin:  Negative for rash.  Neurological:  Positive for weakness. Negative for dizziness, syncope, speech difficulty, numbness and headaches.   Physical Exam Updated Vital Signs BP 136/89 (BP Location: Left Arm)   Pulse 88   Temp 98.5 F (36.9 C) (Oral)   Resp 18   Ht 5\' 9"  (1.753 m)   Wt 81.2 kg   SpO2 100%   BMI 26.43 kg/m   Physical Exam Vitals and nursing note reviewed.  Constitutional:      General: He is not in acute distress.    Appearance: Normal appearance. He is well-developed. He is not ill-appearing or diaphoretic.  HENT:     Head: Normocephalic and atraumatic.  Eyes:     General: No visual field deficit.    Extraocular Movements: Extraocular movements intact.     Conjunctiva/sclera: Conjunctivae normal.     Pupils: Pupils are equal, round, and reactive to light.  Cardiovascular:     Rate and Rhythm: Normal rate and regular rhythm.     Pulses: Normal pulses.     Heart sounds: Normal heart sounds. No murmur heard.   No friction rub. No gallop.  Pulmonary:     Effort: Pulmonary effort is normal. No respiratory distress.     Breath sounds: Normal breath sounds. No wheezing or rales.  Abdominal:     General: There is no distension.     Palpations:  Abdomen is soft.     Tenderness: There is no abdominal tenderness. There is no guarding.  Musculoskeletal:        General: No swelling or tenderness.     Cervical back: Normal range of motion.  Skin:    General: Skin is warm and dry.     Findings: No erythema or rash.  Neurological:     General: No focal deficit present.     Mental Status: He is alert and oriented to person, place, and time.     GCS: GCS eye subscore is 4. GCS verbal subscore is 5. GCS motor subscore is 6.     Cranial Nerves: No cranial nerve deficit, dysarthria or facial asymmetry.  Sensory: No sensory deficit.     Motor: Weakness (left sided weakness, mild LUE and LLE) present. No tremor.     Coordination: Coordination normal. Finger-Nose-Finger Test normal.     Gait: Gait normal.    ED Results / Procedures / Treatments   Labs (all labs ordered are listed, but only abnormal results are displayed) Labs Reviewed  CBC WITH DIFFERENTIAL/PLATELET - Abnormal; Notable for the following components:      Result Value   WBC 11.0 (*)    RBC 4.08 (*)    Hemoglobin 12.5 (*)    HCT 38.0 (*)    Platelets 458 (*)    Monocytes Absolute 1.3 (*)    All other components within normal limits  COMPREHENSIVE METABOLIC PANEL - Abnormal; Notable for the following components:   Creatinine, Ser 0.46 (*)    Total Bilirubin 0.2 (*)    All other components within normal limits  RESP PANEL BY RT-PCR (FLU A&B, COVID) [id number]    EKG EKG Interpretation  Date/Time:  Tuesday October 06 2021 18:34:13 EDT Ventricular Rate:  83 PR Interval:  132 QRS Duration: 82 QT Interval:  382 QTC Calculation: 449 R Axis:   49 Text Interpretation: Sinus rhythm Abnormal R-wave progression, early transition Baseline wander in lead(s) I III aVL No previous ECGs available Confirmed by Gareth Morgan [phone]) on 10/06/2021 7:53:02 PM  Radiology CT Head Wo Contrast  Result Date: 10/06/2021 CLINICAL DATA:  Neuro deficit, acute, stroke suspected.  Left sided weakness. History of melanoma. EXAM: CT HEAD WITHOUT CONTRAST TECHNIQUE: Contiguous axial images were obtained from the base of the skull through the vertex without intravenous contrast. COMPARISON:  07/28/2011 head MRI report FINDINGS: Brain: There is extensive vasogenic edema in the right frontal lobe with milder edema anteriorly in the left frontal lobe. There is a suspected 2 cm mass in the parasagittal right frontal lobe. Edema results in mild mass effect on the frontal horn of the right lateral ventricle. No intracranial hemorrhage, definite acute cortically based infarct, significant midline shift, or extra-axial fluid collection is identified. Vascular: Calcified atherosclerosis at the skull base. No hyperdense vessel. Skull: No fracture or suspicious osseous lesion. Sinuses/Orbits: Mild mucosal thickening in the paranasal sinuses. Clear mastoid air cells. Unremarkable orbits. Other: None. IMPRESSION: Vasogenic edema in the frontal lobes, extensive on the right. This is most concerning for metastatic disease, and a brain MRI without and with contrast is recommended for further evaluation. Electronically Signed   By: Logan Bores M.D.   On: 10/06/2021 18:59   MR BRAIN W WO CONTRAST  Result Date: 10/06/2021 CLINICAL DATA:  Brain mass or lesion EXAM: MRI HEAD WITHOUT AND WITH CONTRAST TECHNIQUE: Multiplanar, multiecho pulse sequences of the brain and surrounding structures were obtained without and with intravenous contrast. CONTRAST:  8.49mL GADAVIST GADOBUTROL 1 MMOL/ML IV SOLN COMPARISON:  No prior MRI, correlation is made with same day CT brain FINDINGS: Brain: Multiple peripherally enhancing intracranial lesions, the largest of which is a 2.2 x 1.5 x 2.3 cm (AP x TR x CC) mass along the right frontal falx and is favored to extra-axial (series 16, image 138 and series 19, image 16). Significant surrounding T2 hyperintense signal, likely edema, which extends into the anterior and posterior  right frontal lobe. Additional peripherally enhancing intraparenchymal lesions are also noted in the anterior left frontal lobe and posterior left parietal lobe. The left frontal lobe lesion measures 1.1 x 0.7 x 0.9 cm (series 16, image 121 and series 19, image 10). The  left parietal lesion measures 0.8 x 0.7 x 0.6 cm (series 16, image 124 and series 19, image 26). There is significant edema associated with the left frontal but not the left parietal lesion. No acute infarct, hemorrhage, hydrocephalus, extra-axial collection, or midline shift. Vascular: Normal flow voids. Skull and upper cervical spine: Intrinsically T1 hyperintense lesion in the clivus (series 16, image 31), indeterminate for enhancement. Otherwise normal marrow signal. Sinuses/Orbits: Mucosal thickening in the ethmoid air cells. Otherwise negative. Other: The mastoids are well aerated. IMPRESSION: Multiple peripherally enhancing intracranial lesions, the largest of which appears to be dural based, along the right aspect of the falx, with 2 additional peripherally enhancing lesions in the left frontal and parietal lobes. These are overall concerning for metastatic disease. Electronically Signed   By: Merilyn Baba M.D.   On: 10/06/2021 23:07   US Venous Img Lower Unilateral Left  Result Date: 10/06/2021 CLINICAL DATA:  Pain. EXAM: Left LOWER EXTREMITY VENOUS DOPPLER ULTRASOUND TECHNIQUE: Gray-scale sonography with compression, as well as color and duplex ultrasound, were performed to evaluate the deep venous system(s) from the level of the common femoral vein through the popliteal and proximal calf veins. COMPARISON:  None. FINDINGS: VENOUS Normal compressibility of the common femoral, superficial femoral, and popliteal veins, as well as the visualized calf veins. Visualized portions of profunda femoral vein and great saphenous vein unremarkable. No filling defects to suggest DVT on grayscale or color Doppler imaging. Doppler waveforms show  normal direction of venous flow, normal respiratory plasticity and response to augmentation. Limited views of the contralateral common femoral vein are unremarkable. OTHER Slow flow noted within the left popliteal vein of uncertain etiology. Limitations: none IMPRESSION: No evidence for deep venous thrombosis in the left lower extremity. Electronically Signed   By: Ronney Asters M.D.   On: 10/06/2021 20:09   DG Chest Portable 1 View  Result Date: 10/06/2021 CLINICAL DATA:  Pain EXAM: PORTABLE CHEST 1 VIEW COMPARISON:  08/11/2021 FINDINGS: Persistent right middle lobe opacity no pleural effusion. Stable cardiomediastinal silhouette. IMPRESSION: Persistent right middle lobe opacity, given lack of interval clearing, recommend CT chest for further evaluation. Electronically Signed   By: Donavan Foil M.D.   On: 10/06/2021 19:08    Procedures .Critical Care Performed by: Gareth Morgan, MD Authorized by: Gareth Morgan, MD   Critical care provider statement:    Critical care time (minutes):  30   Critical care was necessary to treat or prevent imminent or life-threatening deterioration of the following conditions:  CNS failure or compromise   Critical care was time spent personally by me on the following activities:  Discussions with consultants, examination of patient, ordering and review of radiographic studies, ordering and review of laboratory studies and ordering and performing treatments and interventions   Medications Ordered in ED Medications  nicotine (NICODERM CQ - dosed in mg/24 hours) patch 21 mg (21 mg Transdermal Not Given 10/06/21 2147)  dexamethasone (DECADRON) injection 10 mg (10 mg Intravenous Given 10/06/21 1954)  gadobutrol (GADAVIST) 1 MMOL/ML injection 8.5 mL (8.5 mLs Intravenous Contrast Given 10/06/21 2204)    ED Course  I have reviewed the triage vital signs and the nursing notes.  Pertinent labs & imaging results that were available during my care of the patient  were reviewed by me and considered in my medical decision making (see chart for details).    MDM Rules/Calculators/A&P  60yo male with history of melanoma (in remission for 8 years, was in clinical trial at [facility]), vocal cord polyps, nicotine dependence, left popliteal mass resection, pigmented villonodular synovitis, abnormal TSH, presents with concern for left-sided weakness.  Has both left upper and left lower extremity weakness on exam, suspect central etiology of his weakness.  Labs, CT head were ordered.  Given his continuing cough, chest x-ray was ordered.  Also reports left-sided pain behind his calf, and ultrasound was ordered to evaluate for DVT.  Regarding his leg pain, he has no signs of infection, normal pulses and have low suspicion for acute arterial thrombus or infection.  DVT study negative.   CT head returned showing vasogenic edema most concerning for metastatic disease.  Discussed with Reinaldo Meeker NP of [facility], plan for decadron scheduled and consultation after MRI WWO contrast  XR chest shows persistent opacity, will plan on CT for further evaluation particularly in setting of CT head findings. Given his allergy to contrast he will need pre-treatment and feel this can be deferred to inpatient team.  Notified oncology team at [facility] who will notify his primary oncologist and agree with plan to admit to [facility] for continued evaluation. Admitted for continued care.   Final Clinical Impression(s) / ED Diagnoses Final diagnoses:  Vasogenic cerebral edema (HCC)  Left-sided weakness  Opacity of lung on imaging study    Rx / DC Orders ED Discharge Orders     None        Gareth Morgan, MD 10/06/21 2356

## 2021-10-06 NOTE — ED Triage Notes (Signed)
Pt hurt back about 3-4 weeks ago, Friday night started to get out of bed to go to the bathroom, felt as if he was going to fall, left foot dragging. Continue drag foot and having pain in the calf of his same leg. Neg Homans sign

## 2021-10-07 ENCOUNTER — Ambulatory Visit
Admit: 2021-10-07 | Discharge: 2021-10-07 | Disposition: A | Discharge status: Home / Self Care | Payer: BC Managed Care – PPO | Attending: Radiation Oncology | Admitting: Radiation Oncology

## 2021-10-07 ENCOUNTER — Encounter (HOSPITAL_COMMUNITY): Payer: Self-pay | Admitting: Internal Medicine

## 2021-10-07 ENCOUNTER — Inpatient Hospital Stay (HOSPITAL_COMMUNITY): Payer: BC Managed Care – PPO

## 2021-10-07 ENCOUNTER — Encounter: Payer: Self-pay | Admitting: Physician Assistant

## 2021-10-07 ENCOUNTER — Telehealth: Payer: Self-pay

## 2021-10-07 ENCOUNTER — Other Ambulatory Visit: Payer: Self-pay | Admitting: Radiation Therapy

## 2021-10-07 DIAGNOSIS — C7931 Secondary malignant neoplasm of brain: Secondary | ICD-10-CM | POA: Diagnosis present

## 2021-10-07 DIAGNOSIS — Z85820 Personal history of malignant melanoma of skin: Secondary | ICD-10-CM | POA: Diagnosis not present

## 2021-10-07 DIAGNOSIS — R531 Weakness: Secondary | ICD-10-CM

## 2021-10-07 DIAGNOSIS — Z8249 Family history of ischemic heart disease and other diseases of the circulatory system: Secondary | ICD-10-CM | POA: Diagnosis not present

## 2021-10-07 DIAGNOSIS — Z801 Family history of malignant neoplasm of trachea, bronchus and lung: Secondary | ICD-10-CM | POA: Diagnosis not present

## 2021-10-07 DIAGNOSIS — R739 Hyperglycemia, unspecified: Secondary | ICD-10-CM | POA: Diagnosis present

## 2021-10-07 DIAGNOSIS — C3490 Malignant neoplasm of unspecified part of unspecified bronchus or lung: Secondary | ICD-10-CM | POA: Diagnosis not present

## 2021-10-07 DIAGNOSIS — Z79899 Other long term (current) drug therapy: Secondary | ICD-10-CM | POA: Diagnosis not present

## 2021-10-07 DIAGNOSIS — F17210 Nicotine dependence, cigarettes, uncomplicated: Secondary | ICD-10-CM | POA: Diagnosis present

## 2021-10-07 DIAGNOSIS — R590 Localized enlarged lymph nodes: Secondary | ICD-10-CM | POA: Diagnosis not present

## 2021-10-07 DIAGNOSIS — C342 Malignant neoplasm of middle lobe, bronchus or lung: Secondary | ICD-10-CM | POA: Diagnosis present

## 2021-10-07 DIAGNOSIS — T380X5A Adverse effect of glucocorticoids and synthetic analogues, initial encounter: Secondary | ICD-10-CM | POA: Diagnosis present

## 2021-10-07 DIAGNOSIS — J9811 Atelectasis: Secondary | ICD-10-CM | POA: Diagnosis present

## 2021-10-07 DIAGNOSIS — Z888 Allergy status to other drugs, medicaments and biological substances status: Secondary | ICD-10-CM | POA: Diagnosis not present

## 2021-10-07 DIAGNOSIS — G936 Cerebral edema: Secondary | ICD-10-CM | POA: Diagnosis present

## 2021-10-07 DIAGNOSIS — Z20822 Contact with and (suspected) exposure to covid-19: Secondary | ICD-10-CM | POA: Diagnosis present

## 2021-10-07 DIAGNOSIS — Z91041 Radiographic dye allergy status: Secondary | ICD-10-CM | POA: Diagnosis not present

## 2021-10-07 DIAGNOSIS — R918 Other nonspecific abnormal finding of lung field: Secondary | ICD-10-CM | POA: Diagnosis not present

## 2021-10-07 DIAGNOSIS — F419 Anxiety disorder, unspecified: Secondary | ICD-10-CM | POA: Diagnosis present

## 2021-10-07 LAB — BASIC METABOLIC PANEL
Anion gap: 8 (ref 5–15)
BUN: 14 mg/dL (ref 6–20)
CO2: 26 mmol/L (ref 22–32)
Calcium: 8.9 mg/dL (ref 8.9–10.3)
Chloride: 101 mmol/L (ref 98–111)
Creatinine, Ser: 0.67 mg/dL (ref 0.61–1.24)
GFR, Estimated: >60 mL/min (ref >60)
Glucose, Bld: 244 mg/dL — ABNORMAL HIGH (ref 70–99)
Potassium: 3.9 mmol/L (ref 3.5–5.1)
Sodium: 135 mmol/L (ref 135–145)

## 2021-10-07 LAB — CBC
HCT: 42.8 % (ref 39.0–52.0)
Hemoglobin: 14.3 g/dL (ref 13.0–17.0)
MCH: 31.2 pg (ref 26.0–34.0)
MCHC: 33.4 g/dL (ref 30.0–36.0)
MCV: 93.2 fL (ref 80.0–100.0)
Platelets: 456 10*3/uL — ABNORMAL HIGH (ref 150–400)
RBC: 4.59 MIL/uL (ref 4.22–5.81)
RDW: 13.2 % (ref 11.5–15.5)
WBC: 8.4 10*3/uL (ref 4.0–10.5)
nRBC: 0 % (ref 0.0–0.2)

## 2021-10-07 LAB — HEMOGLOBIN A1C
Hgb A1c MFr Bld: 5.5 % (ref 4.8–5.6)
Mean Plasma Glucose: 111.15 mg/dL

## 2021-10-07 LAB — HIV ANTIBODY (ROUTINE TESTING W REFLEX): HIV Screen 4th Generation wRfx: NONREACTIVE

## 2021-10-07 LAB — GLUCOSE, CAPILLARY
Glucose-Capillary: 163 mg/dL — ABNORMAL HIGH (ref 70–99)
Glucose-Capillary: 120 mg/dL — ABNORMAL HIGH (ref 70–99)
Glucose-Capillary: 110 mg/dL — ABNORMAL HIGH (ref 70–99)
Glucose-Capillary: 200 mg/dL — ABNORMAL HIGH (ref 70–99)

## 2021-10-07 IMAGING — CT CT ABD-PELV W/ CM
2 of 5 series · 12 of 36 positions shown, 15 images · IV contrast (omnipaque)
Comparison: No relevant priors available at time dictation.

CLINICAL DATA: Metastatic disease evaluation.

EXAM:
CT CHEST, ABDOMEN, AND PELVIS WITH CONTRAST
TECHNIQUE: Multidetector CT imaging of the chest, abdomen and pelvis was
performed following the standard protocol during bolus
administration of intravenous contrast.
CONTRAST:  80mL OMNIPAQUE IOHEXOL 350 MG/ML SOLN

[Series 2: cap with · axial · 0.80mm/px · z∈[-623,-78]mm · 9 of 137 slices shown, 12 images]
[im 14/137  mediastinal]
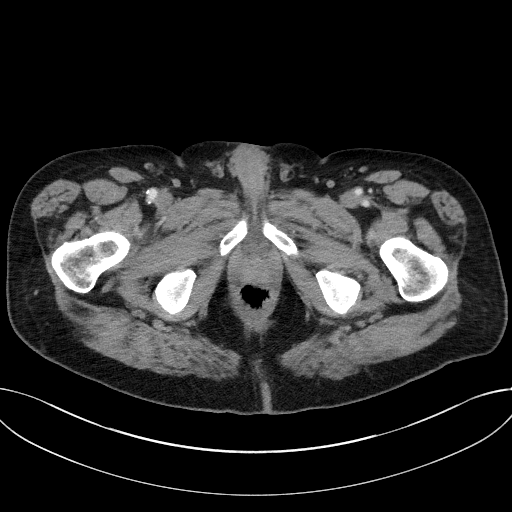
[im 14/137  lung]
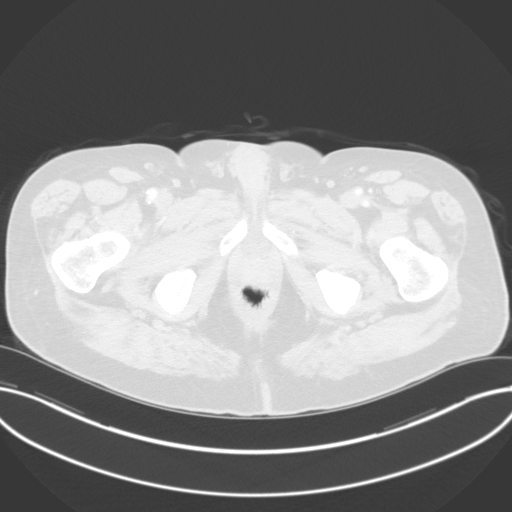
[im 28/137  lung]
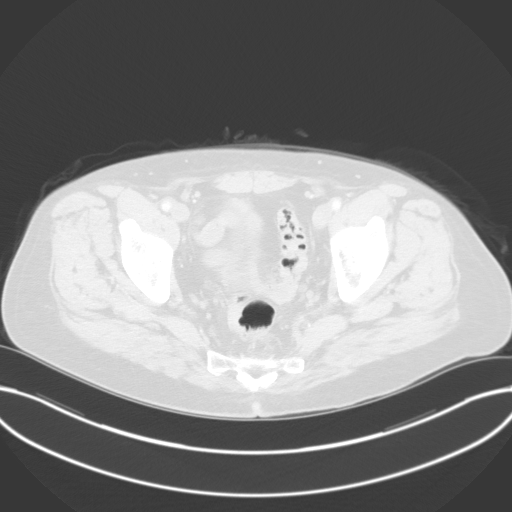
[im 41/137  lung]
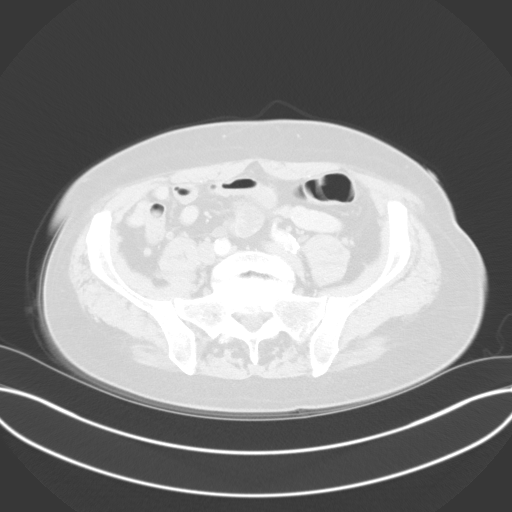
[im 55/137  lung]
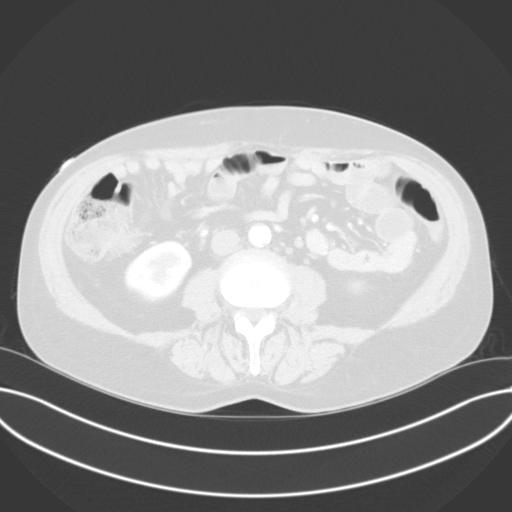
[im 69/137  mediastinal]
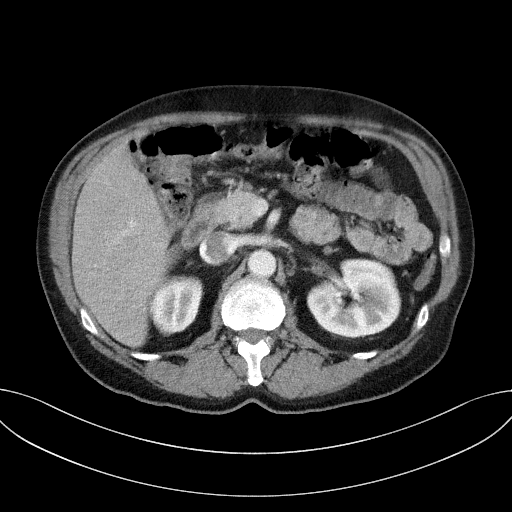
[im 69/137  lung]
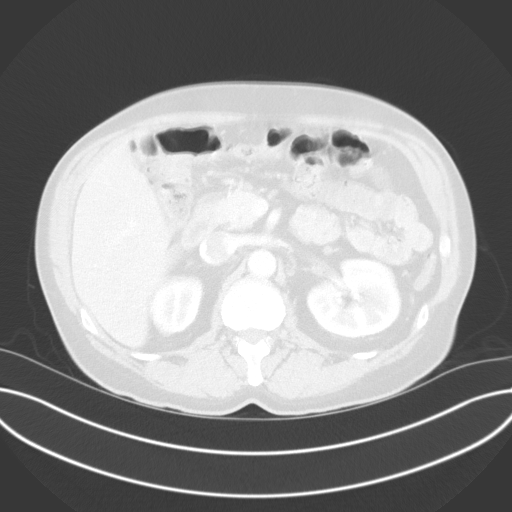
[im 82/137  lung]
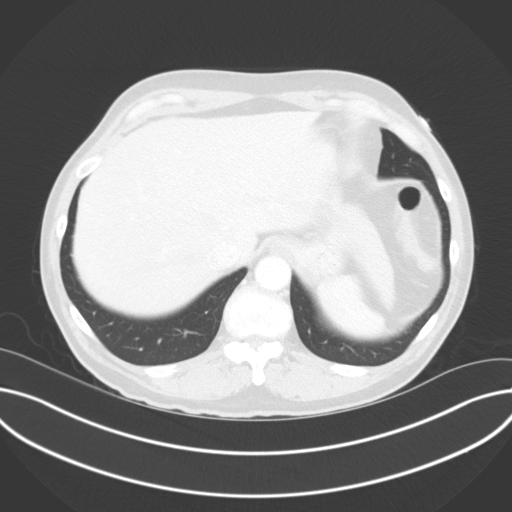
[im 96/137  lung]
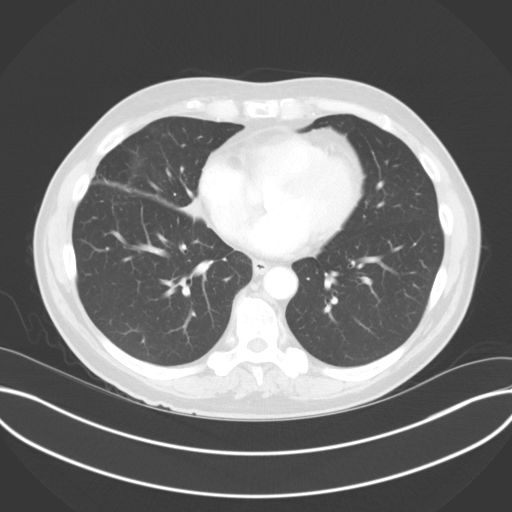
[im 109/137  lung]
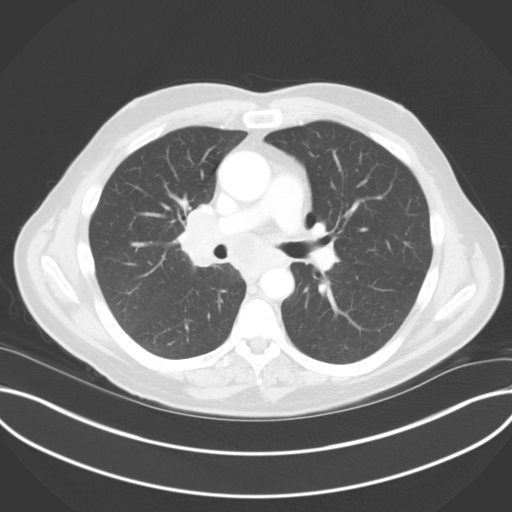
[im 123/137  mediastinal]
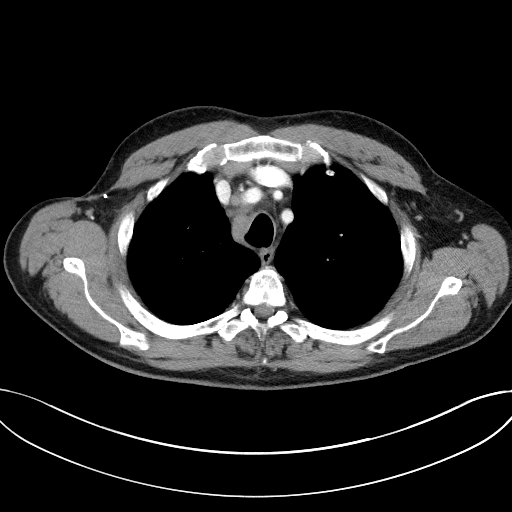
[im 123/137  lung]
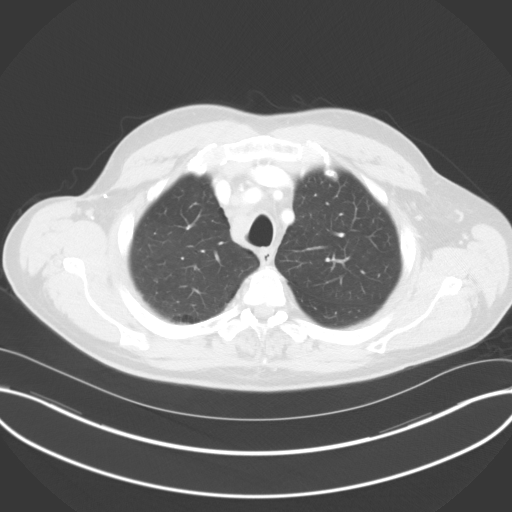

[Series 5: coronals · coronal · 0.82mm/px · 3 of 140 slices shown]
[im 28/140  lung]
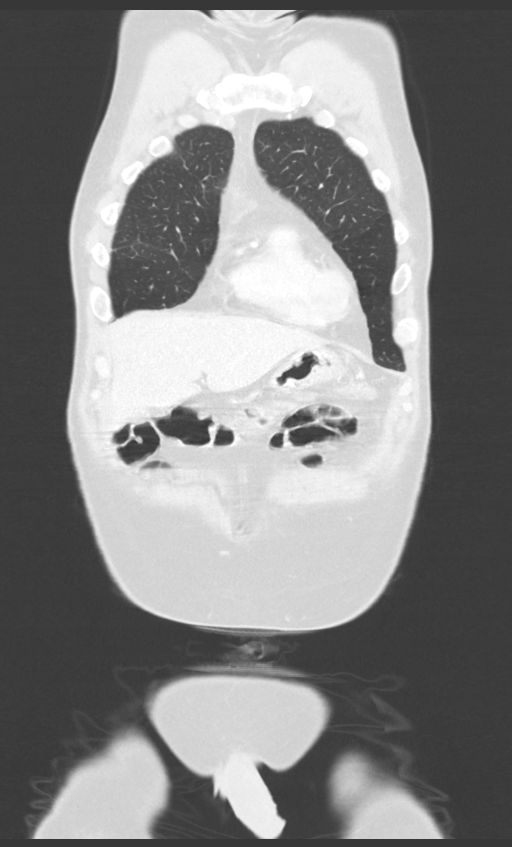
[im 56/140  lung]
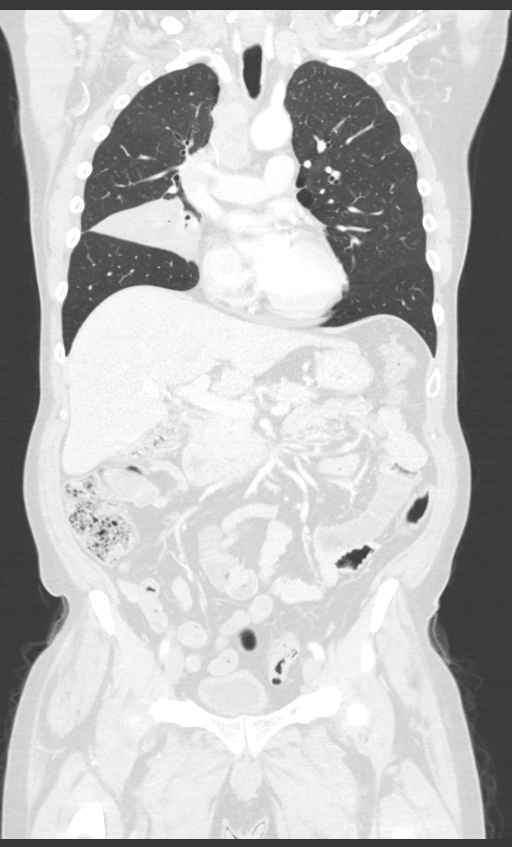
[im 84/140  lung]
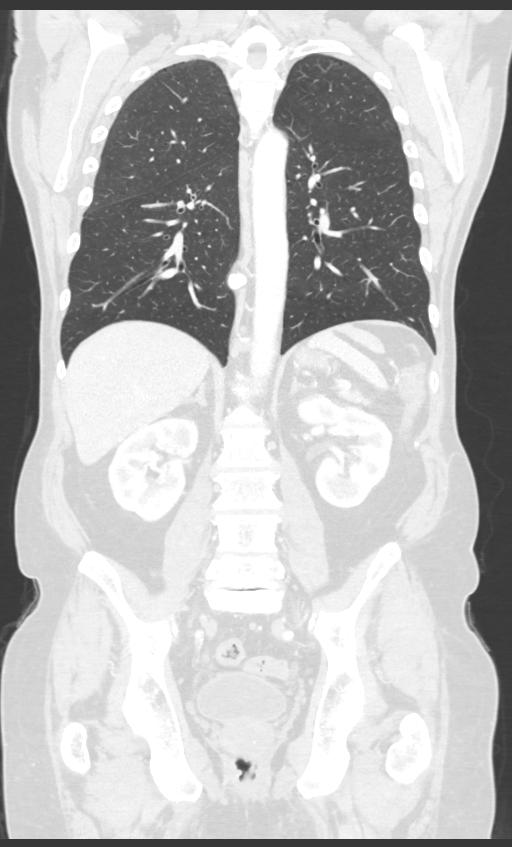

[12 of 36 positions shown; findings below may reference images not displayed]

FINDINGS: CT CHEST FINDINGS

Cardiovascular: Aortic and branch vessel atherosclerosis without
aneurysmal dilation. No central pulmonary embolus on this
nondedicated study. Normal size heart. No significant pericardial
effusion/thickening.

Mediastinum/Nodes: Supraclavicular adenopathy for instance a 2.2 cm
left supraclavicular lymph node on image [DATE]. No discrete thyroid
nodule. Mediastinal and right hilar adenopathy. For reference 2.2 cm
a low right paratracheal lymph node on image [DATE] and a 1.8 cm right
hilar lymph node on image [DATE]. No pathologically enlarged axillary
lymph nodes. Surgical clips in the right axilla.

Lungs/Pleura: Right hilar mass which narrows the right middle lobe
pulmonary artery and bronchus with obstruction of the lateral
segment bronchus of the right middle lobe. The mass is difficult to
accurately measure given the associated postobstructive atelectasis,
but measures approximately 5.6 x 4.0 cm on image 36/2.

There are multiple ground-glass nodular opacities some of which
demonstrate a central solid component for instance a 1.8 cm
ground-glass opacity on image 53/4, a 1.1 cm nodular opacity on
image 45/4 and a 0.9 cm nodular opacity image 48/4.

No suspicious left-sided pulmonary nodules or masses. No pleural
effusion. No pneumothorax. Apical predominant paraseptal
emphysematous change.

Musculoskeletal: No aggressive lytic or blastic lesion bone.
Multilevel degenerative changes spine.

CT ABDOMEN PELVIS FINDINGS

Hepatobiliary: No suspicious hepatic lesion. Gallbladder is
unremarkable. No biliary ductal dilation.

Pancreas: No pancreatic ductal dilation or evidence of acute
inflammation.

Spleen: Within normal limits.

Adrenals/Urinary Tract: Bilateral adrenal glands are unremarkable.
No hydronephrosis. Left cortical renal scarring. No solid enhancing
renal lesion. Mild wall thickening of an incompletely distended
urinary bladder.

Stomach/Bowel: Stomach is unremarkable for degree of distension. No
pathologic dilation of small or large bowel. The appendix and
terminal ileum appear normal. Colon is predominately decompressed
limiting evaluation.

Vascular/Lymphatic: Aortic and branch vessel atherosclerosis without
abdominal aortic aneurysm. No pathologically enlarged abdominal or
pelvic lymph nodes.

Reproductive: Dystrophic prostatic calcifications.

Other: No significant abdominopelvic ascites.

Musculoskeletal: Multilevel degenerative changes spine. Chronic L5
pars defects with grade 1 L5 on S1 anterolisthesis and prominent
L5-S1 discogenic disease.
IMPRESSION: 1. Right hilar mass which narrows the right middle lobe pulmonary
artery and bronchus with obstruction of the lateral segment bronchus
of the right middle lobe. The mass is difficult to accurately
measure given the associated postobstructive atelectasis, but
measures approximately 5.6 x 4.0 cm.
2. Supraclavicular, mediastinal and right hilar adenopathy.
3. Multiple ground-glass nodule in opacities some of which
demonstrate a central solid component. Findings which are
nonspecific and may be infectious and inflammatory versus reflecting
metastatic disease involvement. Suggest attention on short-term
interval follow-up study in 3 months.
4. No evidence of metastatic disease within the abdomen or pelvis.
5. Mild wall thickening of an incompletely distended urinary
bladder. Correlate with urinalysis to exclude cystitis.
6. Chronic L5 pars defects with grade 1 L5 on S1 anterolisthesis and
prominent L5-S1 discogenic disease.
7. Aortic Atherosclerosis ([[place]

## 2021-10-07 IMAGING — CT CT CHEST W/ CM
2 of 5 series · 12 of 36 positions shown, 15 images · IV contrast (OMNIPAQUE)
Comparison: No relevant priors available at time dictation.

CLINICAL DATA: Metastatic disease evaluation.

EXAM:
CT CHEST, ABDOMEN, AND PELVIS WITH CONTRAST
TECHNIQUE: Multidetector CT imaging of the chest, abdomen and pelvis was
performed following the standard protocol during bolus
administration of intravenous contrast.
CONTRAST:  80mL OMNIPAQUE IOHEXOL 350 MG/ML SOLN

[Series 2: cap with · axial · 0.80mm/px · z∈[-623,-78]mm · 9 of 137 slices shown, 12 images]
[im 14/137  mediastinal]
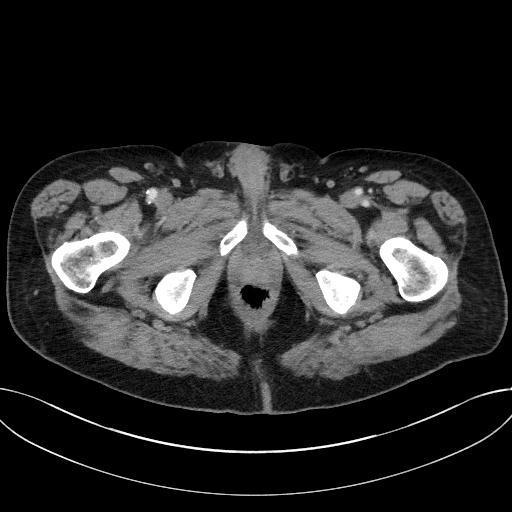
[im 14/137  lung]
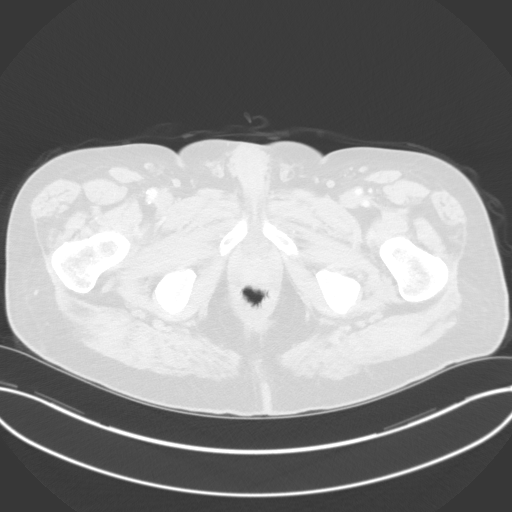
[im 28/137  lung]
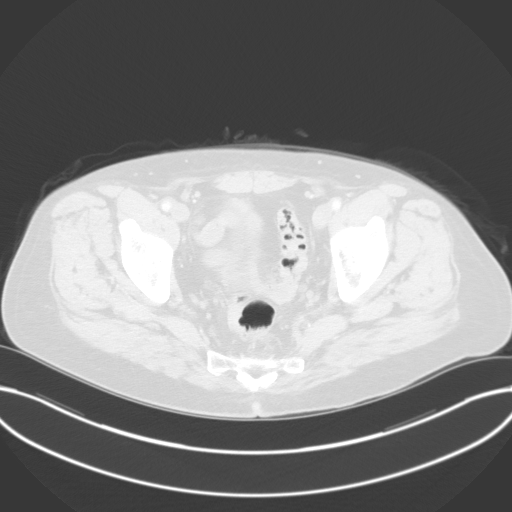
[im 41/137  lung]
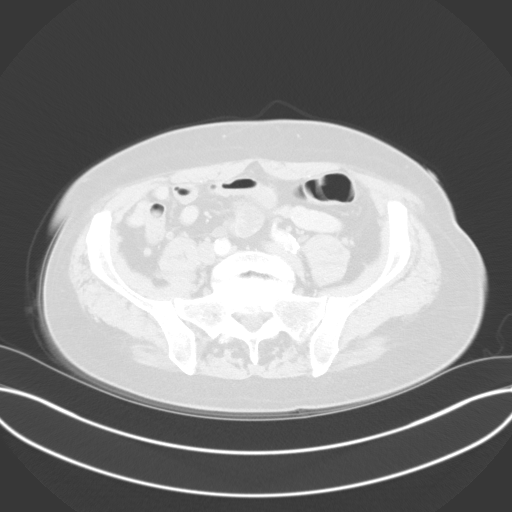
[im 55/137  lung]
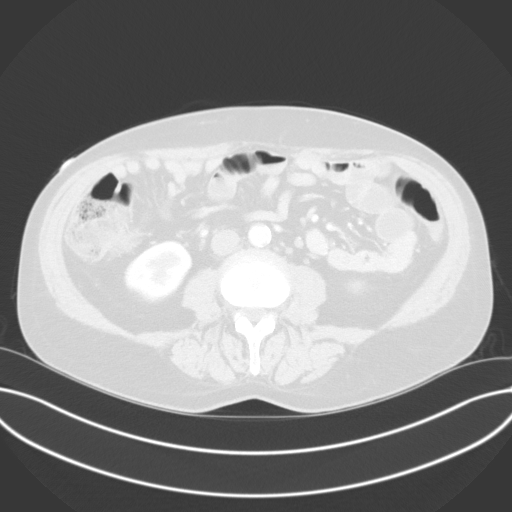
[im 69/137  mediastinal]
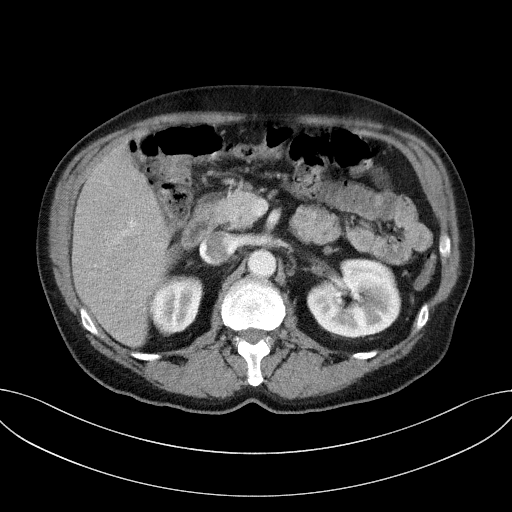
[im 69/137  lung]
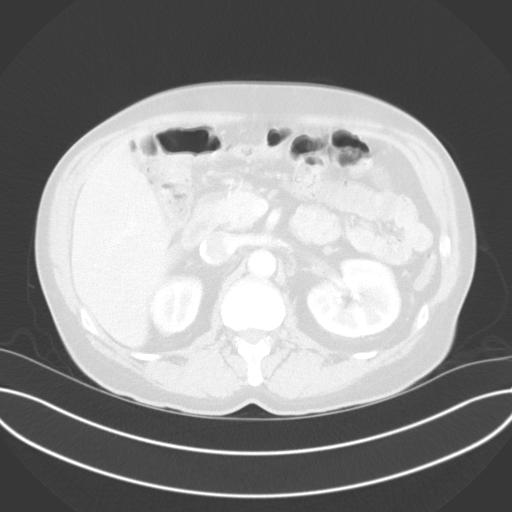
[im 82/137  lung]
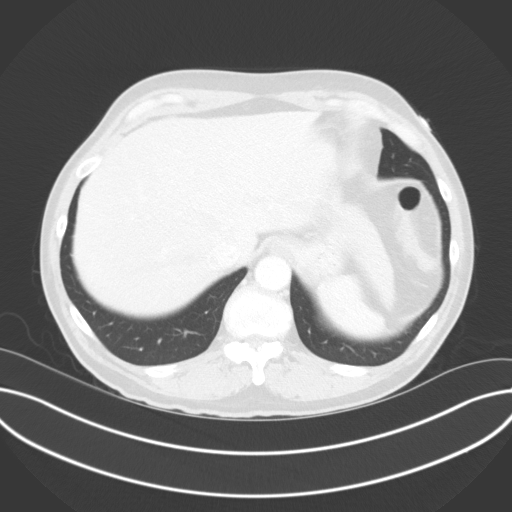
[im 96/137  lung]
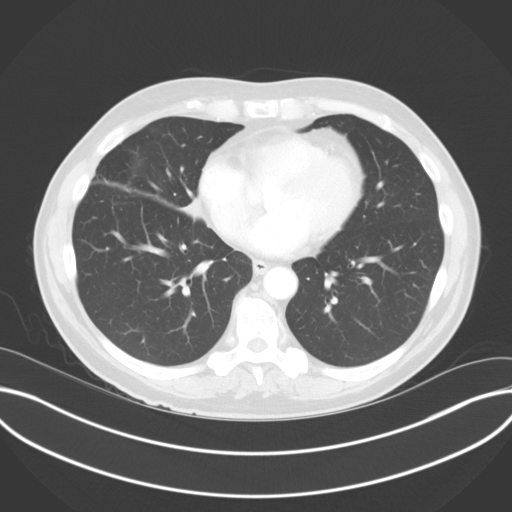
[im 109/137  lung]
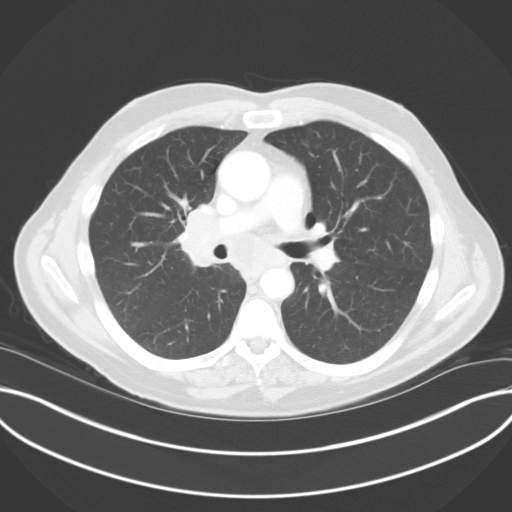
[im 123/137  mediastinal]
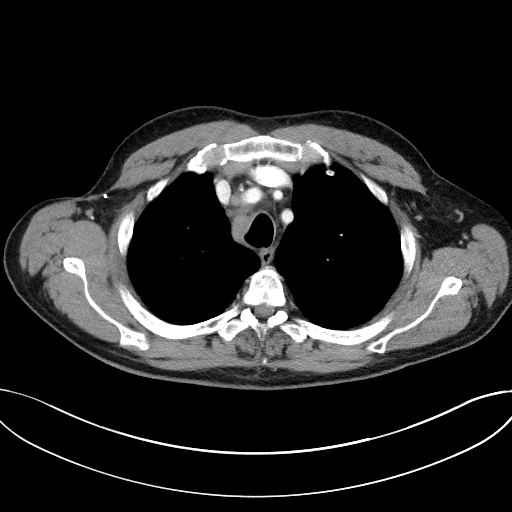
[im 123/137  lung]
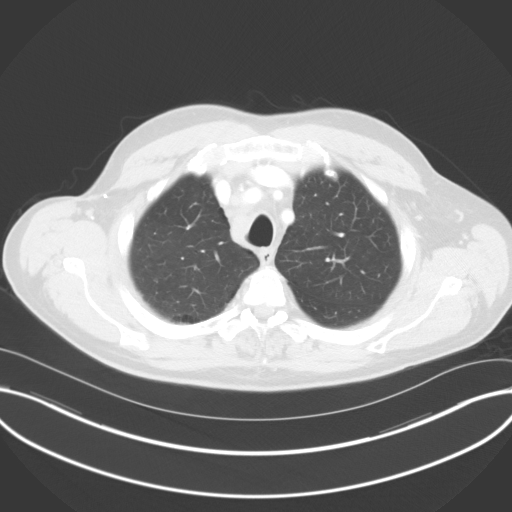

[Series 5: coronals · coronal · 0.82mm/px · 3 of 140 slices shown]
[im 28/140  lung]
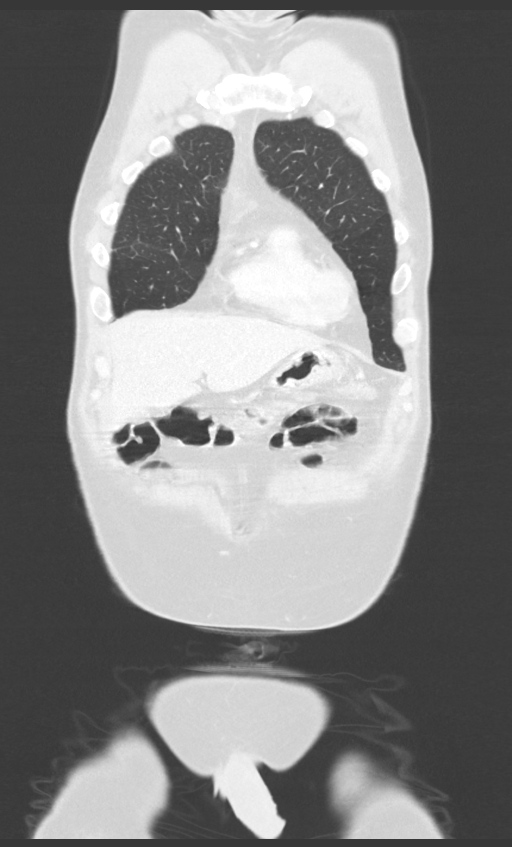
[im 56/140  lung]
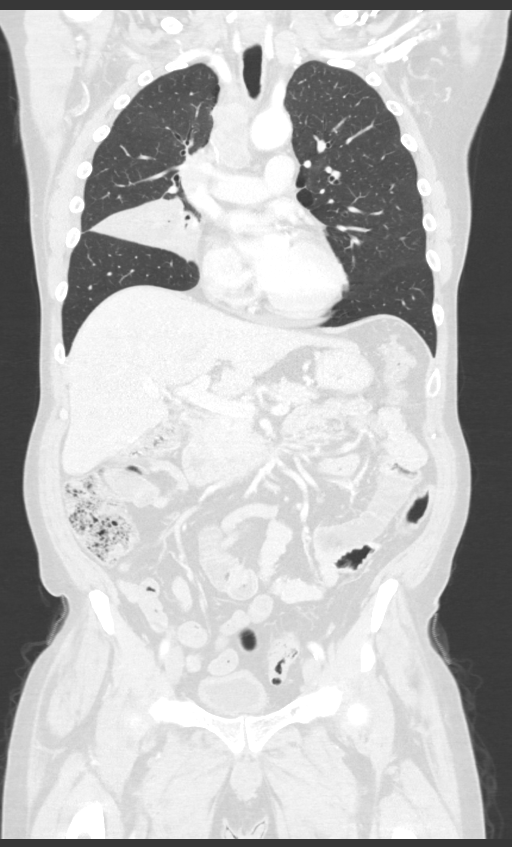
[im 84/140  lung]
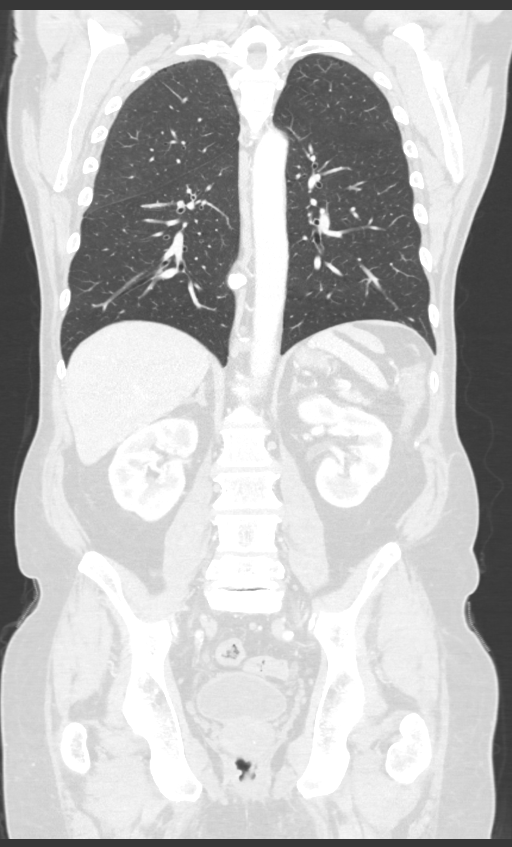

[12 of 36 positions shown; findings below may reference images not displayed]

FINDINGS: CT CHEST FINDINGS

Cardiovascular: Aortic and branch vessel atherosclerosis without
aneurysmal dilation. No central pulmonary embolus on this
nondedicated study. Normal size heart. No significant pericardial
effusion/thickening.

Mediastinum/Nodes: Supraclavicular adenopathy for instance a 2.2 cm
left supraclavicular lymph node on image [DATE]. No discrete thyroid
nodule. Mediastinal and right hilar adenopathy. For reference 2.2 cm
a low right paratracheal lymph node on image [DATE] and a 1.8 cm right
hilar lymph node on image [DATE]. No pathologically enlarged axillary
lymph nodes. Surgical clips in the right axilla.

Lungs/Pleura: Right hilar mass which narrows the right middle lobe
pulmonary artery and bronchus with obstruction of the lateral
segment bronchus of the right middle lobe. The mass is difficult to
accurately measure given the associated postobstructive atelectasis,
but measures approximately 5.6 x 4.0 cm on image 36/2.

There are multiple ground-glass nodular opacities some of which
demonstrate a central solid component for instance a 1.8 cm
ground-glass opacity on image 53/4, a 1.1 cm nodular opacity on
image 45/4 and a 0.9 cm nodular opacity image 48/4.

No suspicious left-sided pulmonary nodules or masses. No pleural
effusion. No pneumothorax. Apical predominant paraseptal
emphysematous change.

Musculoskeletal: No aggressive lytic or blastic lesion bone.
Multilevel degenerative changes spine.

CT ABDOMEN PELVIS FINDINGS

Hepatobiliary: No suspicious hepatic lesion. Gallbladder is
unremarkable. No biliary ductal dilation.

Pancreas: No pancreatic ductal dilation or evidence of acute
inflammation.

Spleen: Within normal limits.

Adrenals/Urinary Tract: Bilateral adrenal glands are unremarkable.
No hydronephrosis. Left cortical renal scarring. No solid enhancing
renal lesion. Mild wall thickening of an incompletely distended
urinary bladder.

Stomach/Bowel: Stomach is unremarkable for degree of distension. No
pathologic dilation of small or large bowel. The appendix and
terminal ileum appear normal. Colon is predominately decompressed
limiting evaluation.

Vascular/Lymphatic: Aortic and branch vessel atherosclerosis without
abdominal aortic aneurysm. No pathologically enlarged abdominal or
pelvic lymph nodes.

Reproductive: Dystrophic prostatic calcifications.

Other: No significant abdominopelvic ascites.

Musculoskeletal: Multilevel degenerative changes spine. Chronic L5
pars defects with grade 1 L5 on S1 anterolisthesis and prominent
L5-S1 discogenic disease.
IMPRESSION: 1. Right hilar mass which narrows the right middle lobe pulmonary
artery and bronchus with obstruction of the lateral segment bronchus
of the right middle lobe. The mass is difficult to accurately
measure given the associated postobstructive atelectasis, but
measures approximately 5.6 x 4.0 cm.
2. Supraclavicular, mediastinal and right hilar adenopathy.
3. Multiple ground-glass nodule in opacities some of which
demonstrate a central solid component. Findings which are
nonspecific and may be infectious and inflammatory versus reflecting
metastatic disease involvement. Suggest attention on short-term
interval follow-up study in 3 months.
4. No evidence of metastatic disease within the abdomen or pelvis.
5. Mild wall thickening of an incompletely distended urinary
bladder. Correlate with urinalysis to exclude cystitis.
6. Chronic L5 pars defects with grade 1 L5 on S1 anterolisthesis and
prominent L5-S1 discogenic disease.
7. Aortic Atherosclerosis [place]

## 2021-10-07 MED ORDER — DIPHENHYDRAMINE HCL 50 MG/ML IJ SOLN: 50.0000 mg | Freq: Once | INTRAMUSCULAR | Status: AC

## 2021-10-07 MED ORDER — DIPHENHYDRAMINE HCL 25 MG PO CAPS: 50.0000 mg | ORAL_CAPSULE | Freq: Once | ORAL | Status: DC

## 2021-10-07 MED ORDER — ACETAMINOPHEN 650 MG RE SUPP: 650.0000 mg | Freq: Four times a day (QID) | RECTAL | Status: DC | PRN

## 2021-10-07 MED ORDER — DEXAMETHASONE SODIUM PHOSPHATE 10 MG/ML IJ SOLN
6.0000 mg | Freq: Four times a day (QID) | INTRAMUSCULAR | Status: DC
  Administered 2021-10-07 – 2021-10-09 (×10): 6 mg via INTRAVENOUS
  Filled 2021-10-07 (×10): qty 1

## 2021-10-07 MED ORDER — ACETAMINOPHEN 325 MG PO TABS: 650.0000 mg | ORAL_TABLET | Freq: Four times a day (QID) | ORAL | Status: DC | PRN

## 2021-10-07 MED ORDER — DIPHENHYDRAMINE HCL 25 MG PO CAPS
50.0000 mg | ORAL_CAPSULE | Freq: Once | ORAL | Status: AC
  Administered 2021-10-07: 50 mg via ORAL
  Filled 2021-10-07: qty 2

## 2021-10-07 MED ORDER — PREDNISONE 5 MG PO TABS
50.0000 mg | ORAL_TABLET | Freq: Once | ORAL | Status: AC
  Administered 2021-10-07: 50 mg via ORAL
  Filled 2021-10-07: qty 2

## 2021-10-07 MED ORDER — PREDNISONE 20 MG PO TABS: 50.0000 mg | ORAL_TABLET | Freq: Four times a day (QID) | ORAL | Status: DC

## 2021-10-07 MED ORDER — PREDNISONE 5 MG PO TABS
50.0000 mg | ORAL_TABLET | ORAL | Status: AC
  Administered 2021-10-07 (×3): 50 mg via ORAL
  Filled 2021-10-07 (×3): qty 2

## 2021-10-07 MED ORDER — IOHEXOL 350 MG/ML SOLN
80.0000 mL | Freq: Once | INTRAVENOUS | Status: AC | PRN
  Administered 2021-10-07: 80 mL via INTRAVENOUS

## 2021-10-07 MED ORDER — INSULIN ASPART 100 UNIT/ML IJ SOLN
0.0000 [IU] | Freq: Three times a day (TID) | INTRAMUSCULAR | Status: DC
  Administered 2021-10-07: 2 [IU] via SUBCUTANEOUS
  Administered 2021-10-08: 1 [IU] via SUBCUTANEOUS
  Administered 2021-10-09: 2 [IU] via SUBCUTANEOUS

## 2021-10-07 MED ORDER — DIPHENHYDRAMINE HCL 50 MG/ML IJ SOLN: 50.0000 mg | Freq: Once | INTRAMUSCULAR | Status: DC

## 2021-10-07 MED ORDER — PANTOPRAZOLE SODIUM 40 MG PO TBEC
40.0000 mg | DELAYED_RELEASE_TABLET | Freq: Every day | ORAL | Status: DC
  Administered 2021-10-07 – 2021-10-08 (×2): 40 mg via ORAL
  Filled 2021-10-07 (×2): qty 1

## 2021-10-07 NOTE — Progress Notes (Signed)
Down to CT via wc accompanied by RN.

## 2021-10-07 NOTE — Telephone Encounter (Signed)
Nurse Assessment Nurse: [name] RN, [name] Date/Time (Eastern Time): 10/06/2021 3:55:53 PM Confirm and document reason for call. If symptomatic, describe symptoms. ---Caller is calling from the office to triage a patient. His wife states he goes to a chiropractor for adjustments, but Saturday he woke up kind of out of it and his leg doesn't seem to respond and when he walks he seems to be dragging his right leg. He also has tightness and numbness in the leg. He also has pain in his calf and ankle. He seems to have right arm weakness as well and pulls right arm in when walking. Does the patient have any new or worsening symptoms? ---Yes Will a triage be completed? ---Yes Related visit to physician within the last 2 weeks? ---No Does the PT have any chronic conditions? (i.e. diabetes, asthma, this includes High risk factors for pregnancy, etc.) ---No Is this a behavioral health or substance abuse call? ---No PLEASE NOTE: All timestamps contained within this report are represented as [place] Standard Time. CONFIDENTIALTY NOTICE: This fax transmission is intended only for the addressee. It contains information that is legally privileged, confidential or otherwise protected from use or disclosure. If you are not the intended recipient, you are strictly prohibited from reviewing, disclosing, copying using or disseminating any of this information or taking any action in reliance on or regarding this information. If you have received this fax in error, please notify us immediately by telephone so that we can arrange for its return to [place]. Phone: [phone], Toll-Free: [phone], Fax: [phone] Page: 2 of 2 Call Id: [id number] Guidelines Guideline Title Affirmed Question Affirmed Notes Nurse Date/Time [name] Time) Neurologic Deficit [1] Weakness (i.e., paralysis, loss of muscle strength) of the face, arm / hand, or leg / foot on one side of the body AND [2] sudden onset AND [3]  present now (Exception: Bell's palsy suspected [i.e., weakness only on one side of the face, developing over hours to days, no other symptoms]) [name], RN, [name] 10/06/2021 3:58:49 PM Disp. Time [name] Time) Disposition Final User 10/06/2021 3:47:30 PM Send to Urgent [name] 10/06/2021 4:01:24 PM Send To RN Personal [name], RN, [name] 10/06/2021 4:15:43 PM 911 Outcome Documentation [name], RN, [name] Reason: 911 cb complete and pcg refused to call 911 and is on the way to ER with the pt now. 10/06/2021 4:00:35 PM Call EMS 911 Now Yes [name], RN, [name] Disagree/Comply Comply Caller Understands Yes PreDisposition Call Doctor Care Advice Given Per Guideline CALL EMS 911 NOW: CARE ADVICE given per Neurologic Deficit (Adult) guideline

## 2021-10-07 NOTE — H&P (Signed)
History and Physical    Christopher Randall JJO:[id number] DOB: [birth year] DOA: 10/06/2021  PCP: Pcp, No  Patient coming from: Home.  Chief Complaint: Left-sided weakness.  HPI: Christopher Randall is a 60 y.o. male with prior history of melanoma in remission followed at [facility] was treated in 2012 presents to the ER after patient was having left-sided weakness.  Patient was initially noticed to have left lower extremity weakness by patient's wife about 3 to 4 days and yesterday morning noticed to have left upper extremity weakness.  At this point patient was brought to the ER.  Last month patient had cough and wheezing and x-ray showed right middle lobe infiltrates for which patient was treated for pneumonia with Augmentin for 10 days.  Patient still has a persistent cough.  ED Course: In the ER patient had CT head and after admission had MRI brain which shows multiple peripherally enhancing intracranial lesions.  ER patient discussed with on-call neurosurgery team who stated to start patient on Decadron and they will see patient in consult.  Since patient has mass in the chest x-ray will need further work-up for primary.  COVID test were negative.  On exam patient does have mild weakness of the left upper and lower extremities.  Review of Systems: As per HPI, rest all negative.   Past Medical History:  Diagnosis Date   Abnormal TSH 09/28/2017   Anxiety 07/03/2012   Depression    Difficulty sleeping 09/28/2017   Examination of participant in clinical trial 09/11/2012   History of immunotherapy 02/23/2016   Melanoma of skin 10/26/2011   Nicotine dependence 07/17/2015   Smoker     Past Surgical History:  Procedure Laterality Date   LEFT POPLITEAL MASS RESECTION  08/27/2011   pigmented villonodular synovitis, negative for malignancy   LYMPH GLAND EXCISION  07/12/2011   right axilla, consistent with melanoma   LYMPH NODE DISSECTION  08/27/2011   right, 0/17 nodes positive      reports that he has been smoking cigarettes. He has been smoking an average of .25 packs per day. He has never used smokeless tobacco. He reports current alcohol use of about 6.0 standard drinks per week. He reports that he does not use drugs.  Allergies  Allergen Reactions   Iopamidol Hives and Itching    Isovue break through with prep, Hives and Itching  Patient had PET?CT scan with 125 mls of Isovue-300. Noted to have two raised itchy hives which self-resolved. Patient had PET/CT scan with contrast on 05/27/2014 while on prednisone prep (3 doses of 50 mg-13 hour prep) and noted to have two hives which self -resolved without treatment   Iodinated Diagnostic Agents Rash   Iodine-131 Rash    Family History  Problem Relation Age of Onset   Hypertension Mother    CAD Mother    Lung cancer Father     Prior to Admission medications   Medication Sig Start Date End Date Taking? Authorizing Provider  Diclofenac Sodium 1.5 % SOLN Place one application on to the skin 2x daily. Patient taking differently: Place one application on to the skin twice a day as needed for muscle cramps or aches 01/15/19  Yes Briscoe Deutscher, DO  Multiple Vitamins-Minerals (ICAPS AREDS 2 PO) Take 1 capsule by mouth daily.   Yes [provider]  nicotine polacrilex (COMMIT) 4 MG lozenge Take 4 mg by mouth 2 (two) times daily as needed for smoking cessation.   Yes [provider]  OVER THE COUNTER MEDICATION Natural Calm Magnesium Powder: Dissolve 1 teaspoon into 8 oz beverage and drink daily   Yes [provider]  amoxicillin-clavulanate (AUGMENTIN) 875-125 MG tablet Take 1 tablet by mouth 2 (two) times daily. Patient not taking: Reported on 10/06/2021 08/11/21   Allwardt, Randa Evens, PA-C  azithromycin (ZITHROMAX Z-PAK) 250 MG tablet Take two tablets on day one, and then one tablet daily the next four days. Patient not taking: Reported on 10/06/2021 08/11/21   Allwardt, Alyssa M, PA-C   LORazepam (ATIVAN) 1 MG tablet Take 1 tablet (1 mg total) by mouth 2 (two) times daily as needed for anxiety. Patient not taking: Reported on 10/06/2021 01/15/19   Briscoe Deutscher, DO    Physical Exam: Constitutional: Moderately built and nourished. Vitals:   10/06/21 1837 10/06/21 1936 10/06/21 2150 10/07/21 0234  BP: (!) 141/88  136/89 (!) 140/95  Pulse: 82  88 (!) 109  Resp: 17  18 18   Temp:   98.5 F (36.9 C) 97.7 F (36.5 C)  TempSrc:   Oral Oral  SpO2: 100% 99% 100% 96%  Weight:      Height:       Eyes: Anicteric no pallor. ENMT: No discharge from the ears eyes nose and mouth. Neck: No mass felt.  No neck rigidity. Respiratory: No rhonchi or crepitations. Cardiovascular: S1-S2 heard. Abdomen: Soft nontender bowel sound present. Musculoskeletal: No edema. Skin: No rash. Neurologic: Alert awake oriented to time place and person.  Moves all extremities.  Mild weakness of the left upper and lower extremities. Psychiatric: Appears normal.  Normal affect.   Labs on Admission: I have personally reviewed following labs and imaging studies  CBC: Recent Labs  Lab 10/06/21 1812  WBC 11.0*  NEUTROABS 6.7  HGB 12.5*  HCT 38.0*  MCV 93.1  PLT 299*   Basic Metabolic Panel: Recent Labs  Lab 10/06/21 1812  NA 137  K 3.8  CL 101  CO2 27  GLUCOSE 87  BUN 12  CREATININE 0.46*  CALCIUM 9.1   GFR: Estimated Creatinine Clearance: 99.4 mL/min (A) (by C-G formula based on SCr of 0.46 mg/dL (L)). Liver Function Tests: Recent Labs  Lab 10/06/21 1812  AST 21  ALT 27  ALKPHOS 57  BILITOT 0.2*  PROT 7.0  ALBUMIN 3.6   No results for input(s): LIPASE, AMYLASE in the last 168 hours. No results for input(s): AMMONIA in the last 168 hours. Coagulation Profile: No results for input(s): INR, PROTIME in the last 168 hours. Cardiac Enzymes: No results for input(s): CKTOTAL, CKMB, CKMBINDEX, TROPONINI in the last 168 hours. BNP (last 3 results) No results for input(s):  PROBNP in the last 8760 hours. HbA1C: No results for input(s): HGBA1C in the last 72 hours. CBG: No results for input(s): GLUCAP in the last 168 hours. Lipid Profile: No results for input(s): CHOL, HDL, LDLCALC, TRIG, CHOLHDL, LDLDIRECT in the last 72 hours. Thyroid Function Tests: No results for input(s): TSH, T4TOTAL, FREET4, T3FREE, THYROIDAB in the last 72 hours. Anemia Panel: No results for input(s): VITAMINB12, FOLATE, FERRITIN, TIBC, IRON, RETICCTPCT in the last 72 hours. Urine analysis: No results found for: COLORURINE, APPEARANCEUR, LABSPEC, PHURINE, GLUCOSEU, HGBUR, BILIRUBINUR, KETONESUR, PROTEINUR, UROBILINOGEN, NITRITE, LEUKOCYTESUR Sepsis Labs: @LABRCNTIP (procalcitonin:4,lacticidven:4) ) Recent Results (from the past 240 hour(s))  Resp Panel by RT-PCR (Flu A&B, Covid) Nasopharyngeal Swab     Status: None   Collection Time: 10/06/21  8:04 PM   Specimen: Nasopharyngeal Swab; Nasopharyngeal(NP) swabs in vial transport medium  Result Value  Ref Range Status   SARS Coronavirus 2 by RT PCR NEGATIVE NEGATIVE Final    Comment: (NOTE) SARS-CoV-2 target nucleic acids are NOT DETECTED.  The SARS-CoV-2 RNA is generally detectable in upper respiratory specimens during the acute phase of infection. The lowest concentration of SARS-CoV-2 viral copies this assay can detect is 138 copies/mL. A negative result does not preclude SARS-Cov-2 infection and should not be used as the sole basis for treatment or other patient management decisions. A negative result may occur with  improper specimen collection/handling, submission of specimen other than nasopharyngeal swab, presence of viral mutation(s) within the areas targeted by this assay, and inadequate number of viral copies(<138 copies/mL). A negative result must be combined with clinical observations, patient history, and epidemiological information. The expected result is Negative.  Fact Sheet for Patients:   EntrepreneurPulse.com.au  Fact Sheet for Healthcare Providers:  IncredibleEmployment.be  This test is no t yet approved or cleared by the [place] FDA and  has been authorized for detection and/or diagnosis of SARS-CoV-2 by FDA under an Emergency Use Authorization (EUA). This EUA will remain  in effect (meaning this test can be used) for the duration of the COVID-19 declaration under Section 564(b)(1) of the Act, 21 U.S.C.section 360bbb-3(b)(1), unless the authorization is terminated  or revoked sooner.       Influenza A by PCR NEGATIVE NEGATIVE Final   Influenza B by PCR NEGATIVE NEGATIVE Final    Comment: (NOTE) The Xpert Xpress SARS-CoV-2/FLU/RSV plus assay is intended as an aid in the diagnosis of influenza from Nasopharyngeal swab specimens and should not be used as a sole basis for treatment. Nasal washings and aspirates are unacceptable for Xpert Xpress SARS-CoV-2/FLU/RSV testing.  Fact Sheet for Patients: EntrepreneurPulse.com.au  Fact Sheet for Healthcare Providers: IncredibleEmployment.be  This test is not yet approved or cleared by the [place] FDA and has been authorized for detection and/or diagnosis of SARS-CoV-2 by FDA under an Emergency Use Authorization (EUA). This EUA will remain in effect (meaning this test can be used) for the duration of the COVID-19 declaration under Section 564(b)(1) of the Act, 21 U.S.C. section 360bbb-3(b)(1), unless the authorization is terminated or revoked.  Performed at [facility], [address], [place], [place] [address]      Radiological Exams on Admission: CT Head Wo Contrast  Result Date: 10/06/2021 CLINICAL DATA:  Neuro deficit, acute, stroke suspected. Left sided weakness. History of melanoma. EXAM: CT HEAD WITHOUT CONTRAST TECHNIQUE: Contiguous axial images were obtained from the base of the skull through the  vertex without intravenous contrast. COMPARISON:  07/28/2011 head MRI report FINDINGS: Brain: There is extensive vasogenic edema in the right frontal lobe with milder edema anteriorly in the left frontal lobe. There is a suspected 2 cm mass in the parasagittal right frontal lobe. Edema results in mild mass effect on the frontal horn of the right lateral ventricle. No intracranial hemorrhage, definite acute cortically based infarct, significant midline shift, or extra-axial fluid collection is identified. Vascular: Calcified atherosclerosis at the skull base. No hyperdense vessel. Skull: No fracture or suspicious osseous lesion. Sinuses/Orbits: Mild mucosal thickening in the paranasal sinuses. Clear mastoid air cells. Unremarkable orbits. Other: None. IMPRESSION: Vasogenic edema in the frontal lobes, extensive on the right. This is most concerning for metastatic disease, and a brain MRI without and with contrast is recommended for further evaluation. Electronically Signed   By: Logan Bores M.D.   On: 10/06/2021 18:59   MR BRAIN W WO CONTRAST  Result  Date: 10/06/2021 CLINICAL DATA:  Brain mass or lesion EXAM: MRI HEAD WITHOUT AND WITH CONTRAST TECHNIQUE: Multiplanar, multiecho pulse sequences of the brain and surrounding structures were obtained without and with intravenous contrast. CONTRAST:  8.49mL GADAVIST GADOBUTROL 1 MMOL/ML IV SOLN COMPARISON:  No prior MRI, correlation is made with same day CT brain FINDINGS: Brain: Multiple peripherally enhancing intracranial lesions, the largest of which is a 2.2 x 1.5 x 2.3 cm (AP x TR x CC) mass along the right frontal falx and is favored to extra-axial (series 16, image 138 and series 19, image 16). Significant surrounding T2 hyperintense signal, likely edema, which extends into the anterior and posterior right frontal lobe. Additional peripherally enhancing intraparenchymal lesions are also noted in the anterior left frontal lobe and posterior left parietal lobe.  The left frontal lobe lesion measures 1.1 x 0.7 x 0.9 cm (series 16, image 121 and series 19, image 10). The left parietal lesion measures 0.8 x 0.7 x 0.6 cm (series 16, image 124 and series 19, image 26). There is significant edema associated with the left frontal but not the left parietal lesion. No acute infarct, hemorrhage, hydrocephalus, extra-axial collection, or midline shift. Vascular: Normal flow voids. Skull and upper cervical spine: Intrinsically T1 hyperintense lesion in the clivus (series 16, image 31), indeterminate for enhancement. Otherwise normal marrow signal. Sinuses/Orbits: Mucosal thickening in the ethmoid air cells. Otherwise negative. Other: The mastoids are well aerated. IMPRESSION: Multiple peripherally enhancing intracranial lesions, the largest of which appears to be dural based, along the right aspect of the falx, with 2 additional peripherally enhancing lesions in the left frontal and parietal lobes. These are overall concerning for metastatic disease. Electronically Signed   By: Merilyn Baba M.D.   On: 10/06/2021 23:07   US Venous Img Lower Unilateral Left  Result Date: 10/06/2021 CLINICAL DATA:  Pain. EXAM: Left LOWER EXTREMITY VENOUS DOPPLER ULTRASOUND TECHNIQUE: Gray-scale sonography with compression, as well as color and duplex ultrasound, were performed to evaluate the deep venous system(s) from the level of the common femoral vein through the popliteal and proximal calf veins. COMPARISON:  None. FINDINGS: VENOUS Normal compressibility of the common femoral, superficial femoral, and popliteal veins, as well as the visualized calf veins. Visualized portions of profunda femoral vein and great saphenous vein unremarkable. No filling defects to suggest DVT on grayscale or color Doppler imaging. Doppler waveforms show normal direction of venous flow, normal respiratory plasticity and response to augmentation. Limited views of the contralateral common femoral vein are unremarkable.  OTHER Slow flow noted within the left popliteal vein of uncertain etiology. Limitations: none IMPRESSION: No evidence for deep venous thrombosis in the left lower extremity. Electronically Signed   By: Ronney Asters M.D.   On: 10/06/2021 20:09   DG Chest Portable 1 View  Result Date: 10/06/2021 CLINICAL DATA:  Pain EXAM: PORTABLE CHEST 1 VIEW COMPARISON:  08/11/2021 FINDINGS: Persistent right middle lobe opacity no pleural effusion. Stable cardiomediastinal silhouette. IMPRESSION: Persistent right middle lobe opacity, given lack of interval clearing, recommend CT chest for further evaluation. Electronically Signed   By: Donavan Foil M.D.   On: 10/06/2021 19:08    EKG: Independently reviewed.  Normal sinus rhythm.  Assessment/Plan Principal Problem:   Brain metastasis (HCC) Active Problems:   Left-sided weakness    Brain mets with chest x-ray showing right middle lobe opacity -neurosurgery on-call was consulted patient was placed on Decadron for the vasogenic edema.  We will get CT chest abdomen pelvis to look for  any primary.  Patient will need a steroid preparation for dye allergy. Left-sided weakness from the brain mets. Prior history of melanoma. Tobacco abuse will need counseling.   DVT prophylaxis: SCDs.  Awaiting neurosurgery consult and patient may need biopsy.  Avoiding anticoagulation. Code Status: Full code. Family Communication: Patient's wife at the bedside. Disposition Plan: Home. Consults called: Neurosurgery. Admission status: Observation.   Rise Patience MD [facility] Pager [phone].  If 7PM-7AM, please contact night-coverage [email or web address] Password [email or web address]  10/07/2021, 2:49 AM

## 2021-10-07 NOTE — Progress Notes (Signed)
60 year old male with metastatic melanoma.  Patient presents with worsening left-sided weakness.  Work-up is demonstrated evidence of multiple areas of metastatic disease with the most prominent being posterior frontal/dural based 2 cm mass on the right side with surrounding edema.  Patient also with a significant left frontal metastasis and other smaller deposits.  Given location and size and multiple areas of involvement I think this patient would be best treated with SRS alone.  Recommend consult to radiation oncology department for evaluation.  Formal consult from [place] to follow later today.

## 2021-10-07 NOTE — Progress Notes (Signed)
CT underway with Dr. Cathlean Sauer and RN available in dept. Pt did well with NO s/o reaction noted.

## 2021-10-07 NOTE — Consult Note (Signed)
Providing Compassionate, Quality Care - Together   Reason for Consult: Brain lesion Referring Physician: Dr. Dianna Limbo is an 60 y.o. male.  HPI: Mr. Bunte is a 60 year old male smoker with history significant for melanoma, anxiety, and depression. He tells me he was diagnosed with melanoma in 2012 and was a participant in a clinical trial at [facility]. He tells me he completed treatment and was considered cancer-free. Over the last three to four days, he and his wife noticed left lower extremity weakness. Yesterday, his symptoms progressed to his left upper extremity. He denies nausea, vomiting, vision changes, speech changes, seizure activity, or syncopal events.  Last month, Mr. Baldwin did have cough and wheezing. His CXR at the time showed right middle lobe infiltrates. He was treated with Augmentin for 10 days. His cough has persisted. CXR in the ED demonstrate persistent opacity in the right middle lobe.  Past Medical History:  Diagnosis Date   Abnormal TSH 09/28/2017   Anxiety 07/03/2012   Depression    Difficulty sleeping 09/28/2017   Examination of participant in clinical trial 09/11/2012   History of immunotherapy 02/23/2016   Melanoma of skin 10/26/2011   Nicotine dependence 07/17/2015   Smoker     Past Surgical History:  Procedure Laterality Date   LEFT POPLITEAL MASS RESECTION  08/27/2011   pigmented villonodular synovitis, negative for malignancy   LYMPH GLAND EXCISION  07/12/2011   right axilla, consistent with melanoma   LYMPH NODE DISSECTION  08/27/2011   right, 0/17 nodes positive    Family History  Problem Relation Age of Onset   Hypertension Mother    CAD Mother    Lung cancer Father     Social History:  reports that he has been smoking cigarettes. He has been smoking an average of .25 packs per day. He has never used smokeless tobacco. He reports current alcohol use of about 6.0 standard drinks per week. He reports that he does not use  drugs.  Allergies:  Allergies  Allergen Reactions   Iopamidol Hives and Itching    Isovue break through with prep, Hives and Itching  Patient had PET?CT scan with 125 mls of Isovue-300. Noted to have two raised itchy hives which self-resolved. Patient had PET/CT scan with contrast on 05/27/2014 while on prednisone prep (3 doses of 50 mg-13 hour prep) and noted to have two hives which self -resolved without treatment   Iodinated Diagnostic Agents Rash   Iodine-131 Rash    Medications: I have reviewed the patient's current medications.  Results for orders placed or performed during the hospital encounter of 10/06/21 (from the past 48 hour(s))  CBC with Differential     Status: Abnormal   Collection Time: 10/06/21  6:12 PM  Result Value Ref Range   WBC 11.0 (H) 4.0 - 10.5 K/uL   RBC 4.08 (L) 4.22 - 5.81 MIL/uL   Hemoglobin 12.5 (L) 13.0 - 17.0 g/dL   HCT 38.0 (L) 39.0 - 52.0 %   MCV 93.1 80.0 - 100.0 fL   MCH 30.6 26.0 - 34.0 pg   MCHC 32.9 30.0 - 36.0 g/dL   RDW 13.2 11.5 - 15.5 %   Platelets 458 (H) 150 - 400 K/uL   nRBC 0.0 0.0 - 0.2 %   Neutrophils Relative % 60 %   Neutro Abs 6.7 1.7 - 7.7 K/uL   Lymphocytes Relative 23 %   Lymphs Abs 2.5 0.7 - 4.0 K/uL   Monocytes Relative 12 %  Monocytes Absolute 1.3 (H) 0.1 - 1.0 K/uL   Eosinophils Relative 4 %   Eosinophils Absolute 0.5 0.0 - 0.5 K/uL   Basophils Relative 1 %   Basophils Absolute 0.1 0.0 - 0.1 K/uL   Immature Granulocytes 0 %   Abs Immature Granulocytes 0.04 0.00 - 0.07 K/uL    Comment: Performed at [facility], [address], [place], [place] [address]  Comprehensive metabolic panel     Status: Abnormal   Collection Time: 10/06/21  6:12 PM  Result Value Ref Range   Sodium 137 135 - 145 mmol/L   Potassium 3.8 3.5 - 5.1 mmol/L   Chloride 101 98 - 111 mmol/L   CO2 27 22 - 32 mmol/L   Glucose, Bld 87 70 - 99 mg/dL    Comment: Glucose reference range applies only to samples taken after  fasting for at least 8 hours.   BUN 12 6 - 20 mg/dL   Creatinine, Ser 0.46 (L) 0.61 - 1.24 mg/dL   Calcium 9.1 8.9 - 10.3 mg/dL   Total Protein 7.0 6.5 - 8.1 g/dL   Albumin 3.6 3.5 - 5.0 g/dL   AST 21 15 - 41 U/L   ALT 27 0 - 44 U/L   Alkaline Phosphatase 57 38 - 126 U/L   Total Bilirubin 0.2 (L) 0.3 - 1.2 mg/dL   GFR, Estimated >60 >60 mL/min    Comment: (NOTE) Calculated using the CKD-EPI Creatinine Equation (2021)    Anion gap 9 5 - 15    Comment: Performed at [facility], [place], [place] [address]  Resp Panel by RT-PCR (Flu A&B, Covid) Nasopharyngeal Swab     Status: None   Collection Time: 10/06/21  8:04 PM   Specimen: Nasopharyngeal Swab; Nasopharyngeal(NP) swabs in vial transport medium  Result Value Ref Range   SARS Coronavirus 2 by RT PCR NEGATIVE NEGATIVE    Comment: (NOTE) SARS-CoV-2 target nucleic acids are NOT DETECTED.  The SARS-CoV-2 RNA is generally detectable in upper respiratory specimens during the acute phase of infection. The lowest concentration of SARS-CoV-2 viral copies this assay can detect is 138 copies/mL. A negative result does not preclude SARS-Cov-2 infection and should not be used as the sole basis for treatment or other patient management decisions. A negative result may occur with  improper specimen collection/handling, submission of specimen other than nasopharyngeal swab, presence of viral mutation(s) within the areas targeted by this assay, and inadequate number of viral copies(<138 copies/mL). A negative result must be combined with clinical observations, patient history, and epidemiological information. The expected result is Negative.  Fact Sheet for Patients:  EntrepreneurPulse.com.au  Fact Sheet for Healthcare Providers:  IncredibleEmployment.be  This test is no t yet approved or cleared by the [place] FDA and  has been authorized for detection and/or  diagnosis of SARS-CoV-2 by FDA under an Emergency Use Authorization (EUA). This EUA will remain  in effect (meaning this test can be used) for the duration of the COVID-19 declaration under Section 564(b)(1) of the Act, 21 U.S.C.section 360bbb-3(b)(1), unless the authorization is terminated  or revoked sooner.       Influenza A by PCR NEGATIVE NEGATIVE   Influenza B by PCR NEGATIVE NEGATIVE    Comment: (NOTE) The Xpert Xpress SARS-CoV-2/FLU/RSV plus assay is intended as an aid in the diagnosis of influenza from Nasopharyngeal swab specimens and should not be used as a sole basis for treatment. Nasal washings and aspirates are unacceptable for Xpert Xpress  SARS-CoV-2/FLU/RSV testing.  Fact Sheet for Patients: EntrepreneurPulse.com.au  Fact Sheet for Healthcare Providers: IncredibleEmployment.be  This test is not yet approved or cleared by the [place] FDA and has been authorized for detection and/or diagnosis of SARS-CoV-2 by FDA under an Emergency Use Authorization (EUA). This EUA will remain in effect (meaning this test can be used) for the duration of the COVID-19 declaration under Section 564(b)(1) of the Act, 21 U.S.C. section 360bbb-3(b)(1), unless the authorization is terminated or revoked.  Performed at [facility], [address], [place], [place] [address]   CBC     Status: Abnormal   Collection Time: 10/07/21  4:25 AM  Result Value Ref Range   WBC 8.4 4.0 - 10.5 K/uL   RBC 4.59 4.22 - 5.81 MIL/uL   Hemoglobin 14.3 13.0 - 17.0 g/dL   HCT 42.8 39.0 - 52.0 %   MCV 93.2 80.0 - 100.0 fL   MCH 31.2 26.0 - 34.0 pg   MCHC 33.4 30.0 - 36.0 g/dL   RDW 13.2 11.5 - 15.5 %   Platelets 456 (H) 150 - 400 K/uL   nRBC 0.0 0.0 - 0.2 %    Comment: Performed at [facility], [place] [address] [place], [place] [address]  Basic metabolic panel     Status: Abnormal   Collection Time: 10/07/21  4:25 AM   Result Value Ref Range   Sodium 135 135 - 145 mmol/L   Potassium 3.9 3.5 - 5.1 mmol/L   Chloride 101 98 - 111 mmol/L   CO2 26 22 - 32 mmol/L   Glucose, Bld 244 (H) 70 - 99 mg/dL    Comment: Glucose reference range applies only to samples taken after fasting for at least 8 hours.   BUN 14 6 - 20 mg/dL   Creatinine, Ser 0.67 0.61 - 1.24 mg/dL   Calcium 8.9 8.9 - 10.3 mg/dL   GFR, Estimated >60 >60 mL/min    Comment: (NOTE) Calculated using the CKD-EPI Creatinine Equation (2021)    Anion gap 8 5 - 15    Comment: Performed at [facility], [place] [address] [place], [place] [address]  Hemoglobin A1c     Status: None   Collection Time: 10/07/21  4:25 AM  Result Value Ref Range   Hgb A1c MFr Bld 5.5 4.8 - 5.6 %    Comment: (NOTE) Pre diabetes:          5.7%-6.4%  Diabetes:              >6.4%  Glycemic control for   <7.0% adults with diabetes    Mean Plasma Glucose 111.15 mg/dL    Comment: Performed at [place] [address], [place], [place] [address]  Glucose, capillary     Status: Abnormal   Collection Time: 10/07/21  7:22 AM  Result Value Ref Range   Glucose-Capillary 163 (H) 70 - 99 mg/dL    Comment: Glucose reference range applies only to samples taken after fasting for at least 8 hours.  Glucose, capillary     Status: Abnormal   Collection Time: 10/07/21 11:30 AM  Result Value Ref Range   Glucose-Capillary 120 (H) 70 - 99 mg/dL    Comment: Glucose reference range applies only to samples taken after fasting for at least 8 hours.    CT Head Wo Contrast  Result Date: 10/06/2021 CLINICAL DATA:  Neuro deficit, acute, stroke suspected. Left sided weakness. History of melanoma. EXAM: CT HEAD WITHOUT  CONTRAST TECHNIQUE: Contiguous axial images were obtained from the base of the skull through the vertex without intravenous contrast. COMPARISON:  07/28/2011 head MRI report FINDINGS: Brain: There is extensive vasogenic edema in the right frontal lobe  with milder edema anteriorly in the left frontal lobe. There is a suspected 2 cm mass in the parasagittal right frontal lobe. Edema results in mild mass effect on the frontal horn of the right lateral ventricle. No intracranial hemorrhage, definite acute cortically based infarct, significant midline shift, or extra-axial fluid collection is identified. Vascular: Calcified atherosclerosis at the skull base. No hyperdense vessel. Skull: No fracture or suspicious osseous lesion. Sinuses/Orbits: Mild mucosal thickening in the paranasal sinuses. Clear mastoid air cells. Unremarkable orbits. Other: None. IMPRESSION: Vasogenic edema in the frontal lobes, extensive on the right. This is most concerning for metastatic disease, and a brain MRI without and with contrast is recommended for further evaluation. Electronically Signed   By: Logan Bores M.D.   On: 10/06/2021 18:59   MR BRAIN W WO CONTRAST  Result Date: 10/06/2021 CLINICAL DATA:  Brain mass or lesion EXAM: MRI HEAD WITHOUT AND WITH CONTRAST TECHNIQUE: Multiplanar, multiecho pulse sequences of the brain and surrounding structures were obtained without and with intravenous contrast. CONTRAST:  8.3mL GADAVIST GADOBUTROL 1 MMOL/ML IV SOLN COMPARISON:  No prior MRI, correlation is made with same day CT brain FINDINGS: Brain: Multiple peripherally enhancing intracranial lesions, the largest of which is a 2.2 x 1.5 x 2.3 cm (AP x TR x CC) mass along the right frontal falx and is favored to extra-axial (series 16, image 138 and series 19, image 16). Significant surrounding T2 hyperintense signal, likely edema, which extends into the anterior and posterior right frontal lobe. Additional peripherally enhancing intraparenchymal lesions are also noted in the anterior left frontal lobe and posterior left parietal lobe. The left frontal lobe lesion measures 1.1 x 0.7 x 0.9 cm (series 16, image 121 and series 19, image 10). The left parietal lesion measures 0.8 x 0.7 x 0.6 cm  (series 16, image 124 and series 19, image 26). There is significant edema associated with the left frontal but not the left parietal lesion. No acute infarct, hemorrhage, hydrocephalus, extra-axial collection, or midline shift. Vascular: Normal flow voids. Skull and upper cervical spine: Intrinsically T1 hyperintense lesion in the clivus (series 16, image 31), indeterminate for enhancement. Otherwise normal marrow signal. Sinuses/Orbits: Mucosal thickening in the ethmoid air cells. Otherwise negative. Other: The mastoids are well aerated. IMPRESSION: Multiple peripherally enhancing intracranial lesions, the largest of which appears to be dural based, along the right aspect of the falx, with 2 additional peripherally enhancing lesions in the left frontal and parietal lobes. These are overall concerning for metastatic disease. Electronically Signed   By: Merilyn Baba M.D.   On: 10/06/2021 23:07   US Venous Img Lower Unilateral Left  Result Date: 10/06/2021 CLINICAL DATA:  Pain. EXAM: Left LOWER EXTREMITY VENOUS DOPPLER ULTRASOUND TECHNIQUE: Gray-scale sonography with compression, as well as color and duplex ultrasound, were performed to evaluate the deep venous system(s) from the level of the common femoral vein through the popliteal and proximal calf veins. COMPARISON:  None. FINDINGS: VENOUS Normal compressibility of the common femoral, superficial femoral, and popliteal veins, as well as the visualized calf veins. Visualized portions of profunda femoral vein and great saphenous vein unremarkable. No filling defects to suggest DVT on grayscale or color Doppler imaging. Doppler waveforms show normal direction of venous flow, normal respiratory plasticity and response to augmentation.  Limited views of the contralateral common femoral vein are unremarkable. OTHER Slow flow noted within the left popliteal vein of uncertain etiology. Limitations: none IMPRESSION: No evidence for deep venous thrombosis in the left  lower extremity. Electronically Signed   By: Ronney Asters M.D.   On: 10/06/2021 20:09   DG Chest Portable 1 View  Result Date: 10/06/2021 CLINICAL DATA:  Pain EXAM: PORTABLE CHEST 1 VIEW COMPARISON:  08/11/2021 FINDINGS: Persistent right middle lobe opacity no pleural effusion. Stable cardiomediastinal silhouette. IMPRESSION: Persistent right middle lobe opacity, given lack of interval clearing, recommend CT chest for further evaluation. Electronically Signed   By: Donavan Foil M.D.   On: 10/06/2021 19:08    Review of Systems  Constitutional: Negative.   HENT: Negative.    Eyes: Negative.   Respiratory:  Positive for cough. Negative for hemoptysis and wheezing.   Cardiovascular: Negative.   Genitourinary: Negative.   Musculoskeletal:  Positive for myalgias.  Skin: Negative.   Neurological:  Positive for sensory change and weakness. Negative for speech change, seizures, loss of consciousness and headaches.  Endo/Heme/Allergies: Negative.   Psychiatric/Behavioral: Negative.    Blood pressure 134/77, pulse (!) 102, temperature (!) 97.5 F (36.4 C), resp. rate 18, height 5\' 9"  (1.753 m), weight 81.2 kg, SpO2 97 %. Estimated body mass index is 26.43 kg/m as calculated from the following:   Height as of this encounter: 5\' 9"  (1.753 m).   Weight as of this encounter: 81.2 kg.  Physical Exam Constitutional:      Appearance: Normal appearance. He is normal weight.  HENT:     Head: Normocephalic and atraumatic.     Nose: Nose normal.     Mouth/Throat:     Mouth: Mucous membranes are moist.     Pharynx: Oropharynx is clear.  Eyes:     Extraocular Movements: Extraocular movements intact.     Conjunctiva/sclera: Conjunctivae normal.     Pupils: Pupils are equal, round, and reactive to light.  Cardiovascular:     Rate and Rhythm: Regular rhythm. Tachycardia present.     Pulses: Normal pulses.  Pulmonary:     Effort: Pulmonary effort is normal.  Abdominal:     General: Abdomen is  flat.     Palpations: Abdomen is soft.  Musculoskeletal:        General: Normal range of motion.     Cervical back: Normal range of motion and neck supple.  Skin:    General: Skin is warm and dry.     Capillary Refill: Capillary refill takes less than 2 seconds.  Neurological:     Mental Status: He is alert and oriented to person, place, and time. Mental status is at baseline.     Cranial Nerves: No cranial nerve deficit.     Comments: Mild left-sided weakness  Psychiatric:        Mood and Affect: Mood normal.        Behavior: Behavior normal.    Assessment/Plan: Patient admitted to [facility] following evaluation in the emergency department at [facility]. CT head shows showed vasogenic edema concerning for metastatic disease. MRI brain with and without contrast showed multiple peripherally enhancing intracranial lesions. The largest lesion appears to be dural based, located along the right aspect of the falx. There are two additional peripherally enhancing lesions in the left frontal and parietal lobes. Patient is receiving decadron, with improvement of his left-sided symptoms. CT chest, abdomen, and pelvis are planned for later today to assess for primary lesion. No  Neurosurgical intervention is recommended at this time. The best course of treatment for the metastatic lesions in his brain would be stereotactic radiation. Recommend consult to radiation oncology.  Viona Gilmore, DNP, AGNP-C Nurse Practitioner  [facility] [place] [address], [place], [place], [place] [address] P: [phone]    F: [phone]  10/07/2021, 1:17 PM

## 2021-10-07 NOTE — Progress Notes (Signed)
PROGRESS NOTE    SIVAN QUAST  UEA:[id number] DOB: 11/04/1961 DOA: 10/06/2021 PCP: Pcp, No    Brief Narrative:  Mr. Tuckey was admitted to the hospital with the working diagnosis of new brain metastasis.   60 year old male past medical history for melanoma remission, treated in 2012 who presents with left-sided weakness.  He reported 3-4 days of left lower extremity weakness, that progressed into left upper extremity weakness within 24 hours of him coming to the hospital.  Because of worsening neurologic symptoms he came for further evaluation to the emergency room.  On his initial physical examination his blood pressure 141/88, heart rate 82, respiratory rate 17, temperature 98.5, oxygen saturation 99%, his lungs are clear to auscultation bilaterally, heart S1-S2, present, rhythmic, abdomen soft nontender, no lower extremity edema, 4-5 strength left upper and lower extremity.  Sodium 137, potassium 3.8, chloride 1 1, bicarb 27, glucose 87, BUN 12, creatinine 0.46, white count 11.0, hemoglobin 12.5, hematocrit 38.0, platelets 458. SARS COVID-19 negative.  Head CT with vasogenic edema in the frontal lobes, extensive on the right.  Concerning for metastatic disease. Brain MRI with multiple peripherally enhancing intracranial lesions, largest of which appears to be posterior frontal dural based 2 cm on the right side with surrounding edema.  2 additional peripheral enhancing lesions in the left frontal and parietal lobes.  Chest radiograph with a persona right lobe well delineated opacity, clear margins.  EKG 83 bpm, normal axis, normal intervals, sinus rhythm, no significant ST segment T wave changes.  Patient was placed on systemic corticosteroids.  Neurosurgery was consulted, recommendations to proceed with radiation therapy.  Assessment & Plan:   Principal Problem:   Brain metastasis ([place]) Active Problems:   Left-sided weakness   Brain metastases (HCC)   Brain metastasis  (new). Multiple metastatic lesions, larger right frontal dural bases 2 cm. Patient with no headache, no nausea or vomiting.  Continue to have weakness on the left side.  Plan to continue with dexamethasone for brain edema. Consult radiation oncology for brain radiation. Further work up with CT, abdomen and pelvis. He is allergic to contrast and he is getting prep.  Has a opacity on his right lower lobe.   2. History of melanoma.  Had treatment in [name] 2001.   3. Tobacco use. Smoking cessation.      Patient continue to be at high risk for worsening neurologic deficit   Status is: Inpatient  Remains inpatient appropriate because: workup for brain lesions   DVT prophylaxis: [place] d  Code Status:   full  Family Communication:  I spoke with patient's son at the bedside, we talked in detail about patient's condition, plan of care and prognosis and all questions were addressed.    Consultants:  Neurosurgery Radiation oncology     Subjective: Patient continue to have left sided weakness, no nausea or vomiting, no headache.   Objective: Vitals:   10/06/21 2150 10/07/21 0234 10/07/21 0643 10/07/21 0906  BP: 136/89 (!) 140/95 121/84 134/77  Pulse: 88 (!) 109 84 (!) 102  Resp: 18 18 18 18   Temp: 98.5 F (36.9 C) 97.7 F (36.5 C) (!) 97.3 F (36.3 C) (!) 97.5 F (36.4 C)  TempSrc: Oral Oral Oral   SpO2: 100% 96% 98% 97%  Weight:      Height:        Intake/Output Summary (Last 24 hours) at 10/07/2021 1242 Last data filed at 10/07/2021 0907 Gross per 24 hour  Intake 480 ml  Output --  Net 480 ml   Filed Weights   10/06/21 1644  Weight: 81.2 kg    Examination:   General: Not in pain or dyspnea,  Neurology: Awake and alert, left sided weakness.  E ENT: no pallor, no icterus, oral mucosa moist Cardiovascular: No JVD. S1-S2 present, rhythmic, no gallops, rubs, or murmurs. No lower extremity edema. Pulmonary: vesicular breath sounds bilaterally, adequate air movement,  no wheezing, rhonchi or rales. Gastrointestinal. Abdomen soft and non tender Skin. No rashes Musculoskeletal: no joint deformities     Data Reviewed: I have personally reviewed following labs and imaging studies  CBC: Recent Labs  Lab 10/06/21 1812 10/07/21 0425  WBC 11.0* 8.4  NEUTROABS 6.7  --   HGB 12.5* 14.3  HCT 38.0* 42.8  MCV 93.1 93.2  PLT 458* 295*   Basic Metabolic Panel: Recent Labs  Lab 10/06/21 1812 10/07/21 0425  NA 137 135  K 3.8 3.9  CL 101 101  CO2 27 26  GLUCOSE 87 244*  BUN 12 14  CREATININE 0.46* 0.67  CALCIUM 9.1 8.9   GFR: Estimated Creatinine Clearance: 99.4 mL/min (by C-G formula based on SCr of 0.67 mg/dL). Liver Function Tests: Recent Labs  Lab 10/06/21 1812  AST 21  ALT 27  ALKPHOS 57  BILITOT 0.2*  PROT 7.0  ALBUMIN 3.6   No results for input(s): LIPASE, AMYLASE in the last 168 hours. No results for input(s): AMMONIA in the last 168 hours. Coagulation Profile: No results for input(s): INR, PROTIME in the last 168 hours. Cardiac Enzymes: No results for input(s): CKTOTAL, CKMB, CKMBINDEX, TROPONINI in the last 168 hours. BNP (last 3 results) No results for input(s): PROBNP in the last 8760 hours. HbA1C: Recent Labs    10/07/21 0425  HGBA1C 5.5   CBG: Recent Labs  Lab 10/07/21 0722 10/07/21 1130  GLUCAP 163* 120*   Lipid Profile: No results for input(s): CHOL, HDL, LDLCALC, TRIG, CHOLHDL, LDLDIRECT in the last 72 hours. Thyroid Function Tests: No results for input(s): TSH, T4TOTAL, FREET4, T3FREE, THYROIDAB in the last 72 hours. Anemia Panel: No results for input(s): VITAMINB12, FOLATE, FERRITIN, TIBC, IRON, RETICCTPCT in the last 72 hours.    Radiology Studies: I have reviewed all of the imaging during this hospital visit personally     Scheduled Meds:  dexamethasone (DECADRON) injection  6 mg Intravenous Q6H   diphenhydrAMINE  50 mg Oral Once   Or   diphenhydrAMINE  50 mg Intravenous Once   insulin  aspart  0-9 Units Subcutaneous TID WC   nicotine  21 mg Transdermal Once   pantoprazole  40 mg Oral Daily   Continuous Infusions:   LOS: 0 days        Savannah Erbe Gerome Apley, MD

## 2021-10-08 DIAGNOSIS — R531 Weakness: Secondary | ICD-10-CM | POA: Diagnosis not present

## 2021-10-08 DIAGNOSIS — R918 Other nonspecific abnormal finding of lung field: Secondary | ICD-10-CM | POA: Diagnosis not present

## 2021-10-08 DIAGNOSIS — C7931 Secondary malignant neoplasm of brain: Secondary | ICD-10-CM | POA: Diagnosis not present

## 2021-10-08 LAB — GLUCOSE, CAPILLARY
Glucose-Capillary: 122 mg/dL — ABNORMAL HIGH (ref 70–99)
Glucose-Capillary: 90 mg/dL (ref 70–99)
Glucose-Capillary: 85 mg/dL (ref 70–99)
Glucose-Capillary: 145 mg/dL — ABNORMAL HIGH (ref 70–99)

## 2021-10-08 NOTE — Progress Notes (Signed)
Radiation Oncology         ([phone]) [phone] ________________________________  Initial Inpatient Consultation  Name: Christopher Randall MRN: [id number]  Date: 10/09/2021  DOB: Jan 18, 1961  CC:Pcp, No  Arrien, Jimmy Picket*   REFERRING PHYSICIAN: Arrien, Jimmy Picket*  DIAGNOSIS:     ICD-10-CM   1. Brain metastases ([place])  C79.31       Brain mets, prior history of melanoma (in remission)  Right lung mass  HISTORY OF PRESENT ILLNESS::Christopher Randall is a 60 y.o. male  with prior history of melanoma in remission followed at [facility],  treated in 2012. Patient presented to the ED on 10/06/21 with left-sided weakness.  The patient was initially noticed to have left lower extremity weakness by his wife about 3 to 4 days prior to ED presentation. On 10/06/21  the patient was noted to have left upper extremity weakness. At this point, the patient was brought to the ED.  Imaging performed while admitted includes:  --CT of the head without contrast on 10/06/21 demonstrating vasogenic edema in the frontal lobes, seen as more extensive on the right. Findings notably concerning for metastatic disease.  --Chest x-ray on 10/06/21  demonstrating a persistent right middle lobe opacity. --Lower extremity venous US on 10/06/21 showed no evidence for deep venous thrombosis in the left lower extremity. --MRI of the brain on 10/06/21 with and without contrast revealed multiple peripherally enhancing intracranial lesions, the largest of which appearing to be dural based, along the right aspect of the falx, with 2 additional peripherally enhancing lesions in the left frontal and parietal lobes. Overall findings were again noted as concerning for metastatic disease. --CT of the chest abdomen an pelvis on 10/07/21 further revealed the right hilar mass narrowing the right middle lobe pulmonary artery and bronchus, with obstruction of the lateral segment bronchus of the right middle lobe. The mass  was noted to measure approximately 5.6 x 4.0 cm. Multiple ground-glass nodular opacities were seen as well, though noted as nonspecific and possibly infectious and inflammatory in etiology, or reflective of metastatic disease involvement. Otherwise, no evidence of metastatic disease involvement was seen within the abdomen or pelvis.   The patient is scheduled for bronchoscopy today under the care of Dr. Chase Caller.    Of note: last month, the patient had cough and wheezing and x-ray showed right middle lobe infiltrates and was treated for pneumonia with Augmentin for 10 days.  The patient was seen by Dr. Hal Hope while admitted and noted to still have a persistent cough.   I have personally reviewed his imaging.  I met with him and his wife upstairs in his hospital room today.  He denies any seizures or severe headaches.  Cognitively he is close to baseline but his wife believes that he has had moments where he has not seemed quite as sharp.  He also reports that, in addition to weakness in his upper and lower left extremities he has had decreased sensation.  He reports a history of smoking.  He recently retired.  He goes to the gym regularly.  He has noticed some improvement in his neurologic symptoms since starting steroids as an inpatient.  PREVIOUS RADIATION THERAPY: No  PAST MEDICAL HISTORY:  has a past medical history of Abnormal TSH (09/28/2017), Anxiety (07/03/2012), Depression, Difficulty sleeping (09/28/2017), Examination of participant in clinical trial (09/11/2012), History of immunotherapy (02/23/2016), Melanoma of skin (10/26/2011), Nicotine dependence (07/17/2015), and Smoker.    PAST SURGICAL HISTORY: Past Surgical History:  Procedure Laterality  Date   LEFT POPLITEAL MASS RESECTION  08/27/2011   pigmented villonodular synovitis, negative for malignancy   LYMPH GLAND EXCISION  07/12/2011   right axilla, consistent with melanoma   LYMPH NODE DISSECTION  08/27/2011   right, 0/17 nodes  positive    FAMILY HISTORY: family history includes CAD in his mother; Hypertension in his mother; Lung cancer in his father.  SOCIAL HISTORY:  reports that he has been smoking cigarettes. He has been smoking an average of .25 packs per day. He has never used smokeless tobacco. He reports current alcohol use of about 6.0 standard drinks per week. He reports that he does not use drugs.  ALLERGIES: Iopamidol, Iodinated diagnostic agents, and Iodine-131  MEDICATIONS:  No current facility-administered medications for this encounter.   No current outpatient medications on file.   Facility-Administered Medications Ordered in Other Encounters  Medication Dose Route Frequency Provider Last Rate Last Admin   acetaminophen (TYLENOL) tablet 650 mg  650 mg Oral Q6H PRN Olalere, Adewale A, MD       Or   acetaminophen (TYLENOL) suppository 650 mg  650 mg Rectal Q6H PRN Olalere, Adewale A, MD       dexamethasone (DECADRON) injection 6 mg  6 mg Intravenous Q6H Olalere, Adewale A, MD   6 mg at 10/09/21 1745   insulin aspart (novoLOG) injection 0-9 Units  0-9 Units Subcutaneous TID WC Olalere, Adewale A, MD   2 Units at 10/09/21 1747   lactated ringers infusion   Intravenous Continuous Olalere, Adewale A, MD   New Bag at 10/09/21 1130   nicotine (NICODERM CQ - dosed in mg/24 hours) patch 21 mg  21 mg Transdermal Once Olalere, Adewale A, MD       pantoprazole (PROTONIX) EC tablet 40 mg  40 mg Oral Daily Olalere, Adewale A, MD   40 mg at 10/08/21 0953    REVIEW OF SYSTEMS:  As above.   PHYSICAL EXAM:  vitals were not taken for this visit.   General: Alert and oriented, in no acute distress  HEENT: Head is normocephalic. Extraocular movements are intact. Oropharynx is clear. Heart: Regular in rate and rhythm with no murmurs, rubs, or gallops. Chest: Clear to auscultation bilaterally, with no rhonchi, wheezes, or rales. Extremities: No cyanosis or edema. Musculoskeletal: Reduced sensation and strength in  left upper and lower extremities.  He is ambulatory.. Neurologic: As above.  Cranial nerves II through XII are grossly intact.  Speech is fluent.  Psychiatric: Judgment and insight are intact. Affect is appropriate.  KPS = 80  100 - Normal; no complaints; no evidence of disease. 90   - Able to carry on normal activity; minor signs or symptoms of disease. 80   - Normal activity with effort; some signs or symptoms of disease. 16   - Cares for self; unable to carry on normal activity or to do active work. 60   - Requires occasional assistance, but is able to care for most of his personal needs. 50   - Requires considerable assistance and frequent medical care. 80   - Disabled; requires special care and assistance. 93   - Severely disabled; hospital admission is indicated although death not imminent. 24   - Very sick; hospital admission necessary; active supportive treatment necessary. 10   - Moribund; fatal processes progressing rapidly. 0     - Dead  Karnofsky DA, Abelmann WH, Craver LS and [facility] [phone]) The use of the nitrogen mustards in the palliative treatment of  carcinoma: with particular reference to bronchogenic carcinoma Cancer 1 [id number]  LABORATORY DATA:  Lab Results  Component Value Date   WBC 8.4 10/07/2021   HGB 14.3 10/07/2021   HCT 42.8 10/07/2021   MCV 93.2 10/07/2021   PLT 456 (H) 10/07/2021   CMP     Component Value Date/Time   NA 135 10/07/2021 0425   K 3.9 10/07/2021 0425   CL 101 10/07/2021 0425   CO2 26 10/07/2021 0425   GLUCOSE 244 (H) 10/07/2021 0425   BUN 14 10/07/2021 0425   CREATININE 0.67 10/07/2021 0425   CALCIUM 8.9 10/07/2021 0425   PROT 7.0 10/06/2021 1812   ALBUMIN 3.6 10/06/2021 1812   AST 21 10/06/2021 1812   ALT 27 10/06/2021 1812   ALKPHOS 57 10/06/2021 1812   BILITOT 0.2 (L) 10/06/2021 1812   GFRNONAA >60 10/07/2021 0425         RADIOGRAPHY: CT Head Wo Contrast  Result Date: 10/06/2021 CLINICAL DATA:  Neuro deficit, acute,  stroke suspected. Left sided weakness. History of melanoma. EXAM: CT HEAD WITHOUT CONTRAST TECHNIQUE: Contiguous axial images were obtained from the base of the skull through the vertex without intravenous contrast. COMPARISON:  07/28/2011 head MRI report FINDINGS: Brain: There is extensive vasogenic edema in the right frontal lobe with milder edema anteriorly in the left frontal lobe. There is a suspected 2 cm mass in the parasagittal right frontal lobe. Edema results in mild mass effect on the frontal horn of the right lateral ventricle. No intracranial hemorrhage, definite acute cortically based infarct, significant midline shift, or extra-axial fluid collection is identified. Vascular: Calcified atherosclerosis at the skull base. No hyperdense vessel. Skull: No fracture or suspicious osseous lesion. Sinuses/Orbits: Mild mucosal thickening in the paranasal sinuses. Clear mastoid air cells. Unremarkable orbits. Other: None. IMPRESSION: Vasogenic edema in the frontal lobes, extensive on the right. This is most concerning for metastatic disease, and a brain MRI without and with contrast is recommended for further evaluation. Electronically Signed   By: Logan Bores M.D.   On: 10/06/2021 18:59   CT CHEST W CONTRAST  Result Date: 10/07/2021 CLINICAL DATA:  Metastatic disease evaluation. EXAM: CT CHEST, ABDOMEN, AND PELVIS WITH CONTRAST TECHNIQUE: Multidetector CT imaging of the chest, abdomen and pelvis was performed following the standard protocol during bolus administration of intravenous contrast. CONTRAST:  1m OMNIPAQUE IOHEXOL 350 MG/ML SOLN COMPARISON:  No relevant priors available at time dictation. FINDINGS: CT CHEST FINDINGS Cardiovascular: Aortic and branch vessel atherosclerosis without aneurysmal dilation. No central pulmonary embolus on this nondedicated study. Normal size heart. No significant pericardial effusion/thickening. Mediastinum/Nodes: Supraclavicular adenopathy for instance a 2.2 cm left  supraclavicular lymph node on image 4/2. No discrete thyroid nodule. Mediastinal and right hilar adenopathy. For reference 2.2 cm a low right paratracheal lymph node on image 22/2 and a 1.8 cm right hilar lymph node on image 28/2. No pathologically enlarged axillary lymph nodes. Surgical clips in the right axilla. Lungs/Pleura: Right hilar mass which narrows the right middle lobe pulmonary artery and bronchus with obstruction of the lateral segment bronchus of the right middle lobe. The mass is difficult to accurately measure given the associated postobstructive atelectasis, but measures approximately 5.6 x 4.0 cm on image 36/2. There are multiple ground-glass nodular opacities some of which demonstrate a central solid component for instance a 1.8 cm ground-glass opacity on image 53/4, a 1.1 cm nodular opacity on image 45/4 and a 0.9 cm nodular opacity image 48/4. No suspicious left-sided pulmonary nodules or masses.  No pleural effusion. No pneumothorax. Apical predominant paraseptal emphysematous change. Musculoskeletal: No aggressive lytic or blastic lesion bone. Multilevel degenerative changes spine. CT ABDOMEN PELVIS FINDINGS Hepatobiliary: No suspicious hepatic lesion. Gallbladder is unremarkable. No biliary ductal dilation. Pancreas: No pancreatic ductal dilation or evidence of acute inflammation. Spleen: Within normal limits. Adrenals/Urinary Tract: Bilateral adrenal glands are unremarkable. No hydronephrosis. Left cortical renal scarring. No solid enhancing renal lesion. Mild wall thickening of an incompletely distended urinary bladder. Stomach/Bowel: Stomach is unremarkable for degree of distension. No pathologic dilation of small or large bowel. The appendix and terminal ileum appear normal. Colon is predominately decompressed limiting evaluation. Vascular/Lymphatic: Aortic and branch vessel atherosclerosis without abdominal aortic aneurysm. No pathologically enlarged abdominal or pelvic lymph nodes.  Reproductive: Dystrophic prostatic calcifications. Other: No significant abdominopelvic ascites. Musculoskeletal: Multilevel degenerative changes spine. 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 IMPRESSION: 1. Right hilar mass which narrows the right middle lobe pulmonary artery and bronchus with obstruction of the lateral segment bronchus of the right middle lobe. The mass is difficult to accurately measure given the associated postobstructive atelectasis, but measures approximately 5.6 x 4.0 cm. 2. Supraclavicular, mediastinal and right hilar adenopathy. 3. Multiple ground-glass nodule in opacities some of which demonstrate a central solid component. Findings which are nonspecific and may be infectious and inflammatory versus reflecting metastatic disease involvement. Suggest attention on short-term interval follow-up study in 3 months. 4. No evidence of metastatic disease within the abdomen or pelvis. 5. Mild wall thickening of an incompletely distended urinary bladder. Correlate with urinalysis to exclude cystitis. 6. 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 7. Aortic Atherosclerosis (ICD10-I70.0). Electronically Signed   By: Dahlia Bailiff M.D.   On: 10/07/2021 19:07   MR BRAIN W WO CONTRAST  Result Date: 10/06/2021 CLINICAL DATA:  Brain mass or lesion EXAM: MRI HEAD WITHOUT AND WITH CONTRAST TECHNIQUE: Multiplanar, multiecho pulse sequences of the brain and surrounding structures were obtained without and with intravenous contrast. CONTRAST:  8.7m GADAVIST GADOBUTROL 1 MMOL/ML IV SOLN COMPARISON:  No prior MRI, correlation is made with same day CT brain FINDINGS: Brain: Multiple peripherally enhancing intracranial lesions, the largest of which is a 2.2 x 1.5 x 2.3 cm (AP x TR x CC) mass along the right frontal falx and is favored to extra-axial (series 16, image 138 and series 19,  image 16). Significant surrounding T2 hyperintense signal, likely edema, which extends into the anterior and posterior right frontal lobe. Additional peripherally enhancing intraparenchymal lesions are also noted in the anterior left frontal lobe and posterior left parietal lobe. The left frontal lobe lesion measures 1.1 x 0.7 x 0.9 cm (series 16, image 121 and series 19, image 10). The left parietal lesion measures 0.8 x 0.7 x 0.6 cm (series 16, image 124 and series 19, image 26). There is significant edema associated with the left frontal but not the left parietal lesion. No acute infarct, hemorrhage, hydrocephalus, extra-axial collection, or midline shift. Vascular: Normal flow voids. Skull and upper cervical spine: Intrinsically T1 hyperintense lesion in the clivus (series 16, image 31), indeterminate for enhancement. Otherwise normal marrow signal. Sinuses/Orbits: Mucosal thickening in the ethmoid air cells. Otherwise negative. Other: The mastoids are well aerated. IMPRESSION: Multiple peripherally enhancing intracranial lesions, the largest of which appears to be dural based, along the right aspect of the falx, with 2 additional peripherally enhancing lesions in the left frontal and parietal  lobes. These are overall concerning for metastatic disease. Electronically Signed   By: Merilyn Baba M.D.   On: 10/06/2021 23:07   CT ABDOMEN PELVIS W CONTRAST  Result Date: 10/07/2021 CLINICAL DATA:  Metastatic disease evaluation. EXAM: CT CHEST, ABDOMEN, AND PELVIS WITH CONTRAST TECHNIQUE: Multidetector CT imaging of the chest, abdomen and pelvis was performed following the standard protocol during bolus administration of intravenous contrast. CONTRAST:  58m OMNIPAQUE IOHEXOL 350 MG/ML SOLN COMPARISON:  No relevant priors available at time dictation. FINDINGS: CT CHEST FINDINGS Cardiovascular: Aortic and branch vessel atherosclerosis without aneurysmal dilation. No central pulmonary embolus on this nondedicated  study. Normal size heart. No significant pericardial effusion/thickening. Mediastinum/Nodes: Supraclavicular adenopathy for instance a 2.2 cm left supraclavicular lymph node on image 4/2. No discrete thyroid nodule. Mediastinal and right hilar adenopathy. For reference 2.2 cm a low right paratracheal lymph node on image 22/2 and a 1.8 cm right hilar lymph node on image 28/2. No pathologically enlarged axillary lymph nodes. Surgical clips in the right axilla. Lungs/Pleura: Right hilar mass which narrows the right middle lobe pulmonary artery and bronchus with obstruction of the lateral segment bronchus of the right middle lobe. The mass is difficult to accurately measure given the associated postobstructive atelectasis, but measures approximately 5.6 x 4.0 cm on image 36/2. There are multiple ground-glass nodular opacities some of which demonstrate a central solid component for instance a 1.8 cm ground-glass opacity on image 53/4, a 1.1 cm nodular opacity on image 45/4 and a 0.9 cm nodular opacity image 48/4. No suspicious left-sided pulmonary nodules or masses. No pleural effusion. No pneumothorax. Apical predominant paraseptal emphysematous change. Musculoskeletal: No aggressive lytic or blastic lesion bone. Multilevel degenerative changes spine. CT ABDOMEN PELVIS FINDINGS Hepatobiliary: No suspicious hepatic lesion. Gallbladder is unremarkable. No biliary ductal dilation. Pancreas: No pancreatic ductal dilation or evidence of acute inflammation. Spleen: Within normal limits. Adrenals/Urinary Tract: Bilateral adrenal glands are unremarkable. No hydronephrosis. Left cortical renal scarring. No solid enhancing renal lesion. Mild wall thickening of an incompletely distended urinary bladder. Stomach/Bowel: Stomach is unremarkable for degree of distension. No pathologic dilation of small or large bowel. The appendix and terminal ileum appear normal. Colon is predominately decompressed limiting evaluation.  Vascular/Lymphatic: Aortic and branch vessel atherosclerosis without abdominal aortic aneurysm. No pathologically enlarged abdominal or pelvic lymph nodes. Reproductive: Dystrophic prostatic calcifications. Other: No significant abdominopelvic ascites. Musculoskeletal: Multilevel degenerative changes spine. 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 IMPRESSION: 1. Right hilar mass which narrows the right middle lobe pulmonary artery and bronchus with obstruction of the lateral segment bronchus of the right middle lobe. The mass is difficult to accurately measure given the associated postobstructive atelectasis, but measures approximately 5.6 x 4.0 cm. 2. Supraclavicular, mediastinal and right hilar adenopathy. 3. Multiple ground-glass nodule in opacities some of which demonstrate a central solid component. Findings which are nonspecific and may be infectious and inflammatory versus reflecting metastatic disease involvement. Suggest attention on short-term interval follow-up study in 3 months. 4. No evidence of metastatic disease within the abdomen or pelvis. 5. Mild wall thickening of an incompletely distended urinary bladder. Correlate with urinalysis to exclude cystitis. 6. 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 7. Aortic Atherosclerosis (ICD10-I70.0). Electronically Signed   By: JDahlia BailiffM.D.   On: 10/07/2021 19:07   [place]ous Img Lower Unilateral Left  Result Date: 10/06/2021 CLINICAL DATA:  Pain. EXAM: Left LOWER  EXTREMITY VENOUS DOPPLER ULTRASOUND TECHNIQUE: Gray-scale sonography with compression, as well as color and duplex ultrasound, were performed to evaluate the deep venous system(s) from the level of the common femoral vein through the popliteal and proximal calf veins. COMPARISON:  None. FINDINGS: VENOUS Normal compressibility of the common femoral, superficial femoral,  and popliteal veins, as well as the visualized calf veins. Visualized portions of profunda femoral vein and great saphenous vein unremarkable. No filling defects to suggest DVT on grayscale or color Doppler imaging. Doppler waveforms show normal direction of venous flow, normal respiratory plasticity and response to augmentation. Limited views of the contralateral common femoral vein are unremarkable. OTHER Slow flow noted within the left popliteal vein of uncertain etiology. Limitations: none IMPRESSION: No evidence for deep venous thrombosis in the left lower extremity. Electronically Signed   By: Ronney Asters M.D.   On: 10/06/2021 20:09   DG Chest Port 1 View  Result Date: 10/09/2021 CLINICAL DATA:  Respiratory failure. EXAM: PORTABLE CHEST 1 VIEW COMPARISON:  Radiographs 10/06/2021 and 08/11/2021.  CT 10/07/2021. FINDINGS: 1244 hours. Two views obtained. The heart size and mediastinal contours are stable, with subtle evidence of mediastinal and right hilar adenopathy. There is persistent right middle lobe partial collapse and opacity, similar to recent prior studies. The left lung is clear. There is no pleural effusion or pneumothorax. No discrete peripheral lung lesions are identified. Surgical clips are present within the right axilla. IMPRESSION: 1. Stable findings of mediastinal adenopathy, right hilar mass and right middle lobe collapse compared with recent prior studies. Based on the CT, findings are consistent with metastatic disease. 2. No acute superimposed process identified. Electronically Signed   By: Richardean Sale M.D.   On: 10/09/2021 14:57   DG Chest Portable 1 View  Result Date: 10/06/2021 CLINICAL DATA:  Pain EXAM: PORTABLE CHEST 1 VIEW COMPARISON:  08/11/2021 FINDINGS: Persistent right middle lobe opacity no pleural effusion. Stable cardiomediastinal silhouette. IMPRESSION: Persistent right middle lobe opacity, given lack of interval clearing, recommend CT chest for further  evaluation. Electronically Signed   By: Donavan Foil M.D.   On: 10/06/2021 19:08      IMPRESSION/PLAN: This is a very pleasant 60 y.o. male with metastatic disease to the brain.   Pathology is pending from bronchoscopy.  Although he has a history of locally advanced melanoma, there is a concern for primary lung cancer.  He understands that if he has small cell lung cancer, whole brain radiation therapy will be indicated.  For other types of cancer, it is much more likely that stereotactic radiosurgery will be in his best interest.  We discussed that a 3 Tesla MRI would be necessary for treatment planning if he has a type of cancer other than small cell carcinoma.  We discussed that if he has many additional metastases detected on the scan, whole brain radiation therapy would be indicated as well.  Ultimately, I predict that he will be an excellent candidate for stereotactic radiosurgery unless his histology reveals small cell lung cancer.  I had a lengthy discussion with the patient and his wife after reviewing the MRI results with them.  We spoke about whole brain radiotherapy versus stereotactic radiosurgery to the brain. We spoke about the differing risks benefits and side effects of both of these treatments. During part of our discussion, we spoke about the hair loss, fatigue and cognitive effects that can result from whole brain radiotherapy.  Additionally, we spoke about radionecrosis that can result from stereotactic radiosurgery. I explained  that whole brain radiotherapy is more comprehensive and therefore can decrease the chance of recurrences elsewhere in the brain, while stereotactic radiosurgery only treats the areas of gross disease while sparing the rest of the brain parenchyma.  After lengthy discussion, the patient would like to proceed with treatment as indicated.  We will regroup after we get the results to his bronchoscopy.  I will tentatively get him scheduled for at 3 Tesla MRI and CT  simulation if he is a good candidate for stereotactic radiosurgery.   On date of service, in total, I spent 60 minutes on this encounter. Patient was seen in person.  __________________________________________   Eppie Gibson, MD  This document serves as a record of services personally performed by Eppie Gibson, MD. It was created on her behalf by Roney Mans, a trained medical scribe. The creation of this record is based on the scribe's personal observations and the provider's statements to them. This document has been checked and approved by the attending provider.

## 2021-10-08 NOTE — H&P (View-Only) (Signed)
NAME:  Christopher Randall, MRN:  [id number], DOB:  1961/01/25, LOS: 1 ADMISSION DATE:  10/06/2021, CONSULTATION DATE: 10/08/2021 REFERRING MD: Dr. Cathlean Sauer, CHIEF COMPLAINT: Right middle lobe mass  History of Present Illness:  60 year old man, history of tobacco use, history of melanoma in remission (2012).  He was admitted with left-sided weakness.  Head CT and then MRI brain confirmed multiple peripherally enhancing intracranial lesions.  Has been treated with corticosteroids and is planning to meet radiation oncology.  CT scan of the chest performed 10/07/2021 showed a mixed density right middle lobe mass, mixed density mediastinal and subcarinal adenopathy, question with some areas of necrosis.  PCCM consulted regarding tissue diagnosis for presumed malignancy.  He denies dyspnea.  He does have some cough that has been ongoing for a few months.  Some intermittent purulent sputum but no hemoptysis.  Denies any chest pain.  He has been working on cutting down his cigarettes.  Has not stopped completely.  Pertinent  Medical History   Past Medical History:  Diagnosis Date   Abnormal TSH 09/28/2017   Anxiety 07/03/2012   Depression    Difficulty sleeping 09/28/2017   Examination of participant in clinical trial 09/11/2012   History of immunotherapy 02/23/2016   Melanoma of skin 10/26/2011   Nicotine dependence 07/17/2015   Smoker      Significant Hospital Events: Including procedures, antibiotic start and stop dates in addition to other pertinent events   CT chest 10/07/2021  > right hilar mass that extends into the right middle lobe, obstructs the posterior segmental airway of the right middle lobe.  There is supraclavicular, mediastinal and right hilar adenopathy.  Multiple groundglass nodular opacities are present, question whether this is metastatic versus another inflammatory process.  Interim History / Subjective:   Beginning to feel some better.  He still has some cough.  Left-sided  strength improved somewhat  Objective   Blood pressure 136/79, pulse 79, temperature (!) 97.4 F (36.3 C), temperature source Oral, resp. rate 18, height 5\' 9"  (1.753 m), weight 81.2 kg, SpO2 99 %.        Intake/Output Summary (Last 24 hours) at 10/08/2021 1636 Last data filed at 10/08/2021 1400 Gross per 24 hour  Intake 1470 ml  Output --  Net 1470 ml   Filed Weights   10/06/21 1644  Weight: 81.2 kg    Examination: General: Well-appearing man, no distress HENT: Oropharynx clear, no stridor, pupils equal, hearing aids in place Lungs: Clear bilaterally, no wheezes or crackles Cardiovascular: Regular, no murmur Abdomen: Nondistended, positive bowel sounds Extremities: No edema Neuro: Awake, alert, interacting appropriately, nonfocal  Resolved Hospital Problem list     Assessment & Plan:  Right middle lobe mass extending into the right hilum and impacting the right middle lobe airways, right mid lobe pulmonary artery.  He also has subcarinal and mediastinal adenopathy.  Tissue diagnosis will be important here.  I suspect primary lung cancer, possibly small cell based on central necrosis of his lesions.  This would be an unusual place to see metastatic melanoma. -Believe the most straightforward approach to achieve tissue diagnosis is bronchoscopy with biopsies.  It looks like there should be an endobronchial lesion in the posterior segment of the right middle lobe.  There is some mixed density to these lesions, may be some necrosis which could impair pathological evaluation.  If nondiagnostic then could consider surgical resection or needle biopsy of a supraclavicular node. -We will arrange for bronchoscopy with biopsies under anesthesia by Dr. Chase Caller  on 10/20.  Discussed the procedure in full with the patient and his wife.  They understand the benefits, risks and agree to proceed.  Labs   CBC: Recent Labs  Lab 10/06/21 1812 10/07/21 0425  WBC 11.0* 8.4  NEUTROABS 6.7  --    HGB 12.5* 14.3  HCT 38.0* 42.8  MCV 93.1 93.2  PLT 458* 456*    Basic Metabolic Panel: Recent Labs  Lab 10/06/21 1812 10/07/21 0425  NA 137 135  K 3.8 3.9  CL 101 101  CO2 27 26  GLUCOSE 87 244*  BUN 12 14  CREATININE 0.46* 0.67  CALCIUM 9.1 8.9   GFR: Estimated Creatinine Clearance: 99.4 mL/min (by C-G formula based on SCr of 0.67 mg/dL). Recent Labs  Lab 10/06/21 1812 10/07/21 0425  WBC 11.0* 8.4    Liver Function Tests: Recent Labs  Lab 10/06/21 1812  AST 21  ALT 27  ALKPHOS 57  BILITOT 0.2*  PROT 7.0  ALBUMIN 3.6   No results for input(s): LIPASE, AMYLASE in the last 168 hours. No results for input(s): AMMONIA in the last 168 hours.  ABG No results found for: PHART, PCO2ART, PO2ART, HCO3, TCO2, ACIDBASEDEF, O2SAT   Coagulation Profile: No results for input(s): INR, PROTIME in the last 168 hours.  Cardiac Enzymes: No results for input(s): CKTOTAL, CKMB, CKMBINDEX, TROPONINI in the last 168 hours.  HbA1C: Hgb A1c MFr Bld  Date/Time Value Ref Range Status  10/07/2021 04:25 AM 5.5 4.8 - 5.6 % Final    Comment:    (NOTE) Pre diabetes:          5.7%-6.4%  Diabetes:              >6.4%  Glycemic control for   <7.0% adults with diabetes     CBG: Recent Labs  Lab 10/07/21 1636 10/07/21 2141 10/08/21 0734 10/08/21 1121 10/08/21 1628  GLUCAP 110* 200* 122* 90 85    Review of Systems:   As per HPi  Past Medical History:  He,  has a past medical history of Abnormal TSH (09/28/2017), Anxiety (07/03/2012), Depression, Difficulty sleeping (09/28/2017), Examination of participant in clinical trial (09/11/2012), History of immunotherapy (02/23/2016), Melanoma of skin (10/26/2011), Nicotine dependence (07/17/2015), and Smoker.   Surgical History:   Past Surgical History:  Procedure Laterality Date   LEFT POPLITEAL MASS RESECTION  08/27/2011   pigmented villonodular synovitis, negative for malignancy   LYMPH GLAND EXCISION  07/12/2011   right  axilla, consistent with melanoma   LYMPH NODE DISSECTION  08/27/2011   right, 0/17 nodes positive     Social History:   reports that he has been smoking cigarettes. He has been smoking an average of .25 packs per day. He has never used smokeless tobacco. He reports current alcohol use of about 6.0 standard drinks per week. He reports that he does not use drugs.   Family History:  His family history includes CAD in his mother; Hypertension in his mother; Lung cancer in his father.   Allergies Allergies  Allergen Reactions   Iopamidol Hives and Itching    Isovue break through with prep, Hives and Itching  Patient had PET?CT scan with 125 mls of Isovue-300. Noted to have two raised itchy hives which self-resolved. Patient had PET/CT scan with contrast on 05/27/2014 while on prednisone prep (3 doses of 50 mg-13 hour prep) and noted to have two hives which self -resolved without treatment   Iodinated Diagnostic Agents Rash   Iodine-131 Rash  Home Medications  Prior to Admission medications   Medication Sig Start Date End Date Taking? Authorizing Provider  Diclofenac Sodium 1.5 % SOLN Place one application on to the skin 2x daily. Patient taking differently: Place one application on to the skin twice a day as needed for muscle cramps or aches 01/15/19  Yes Briscoe Deutscher, DO  Multiple Vitamins-Minerals (ICAPS AREDS 2 PO) Take 1 capsule by mouth daily.   Yes [provider]  nicotine polacrilex (COMMIT) 4 MG lozenge Take 4 mg by mouth 2 (two) times daily as needed for smoking cessation.   Yes [provider]  OVER THE COUNTER MEDICATION Natural Calm Magnesium Powder: Dissolve 1 teaspoon into 8 oz beverage and drink daily   Yes [provider]  amoxicillin-clavulanate (AUGMENTIN) 875-125 MG tablet Take 1 tablet by mouth 2 (two) times daily. Patient not taking: Reported on 10/06/2021 08/11/21   Allwardt, Randa Evens, PA-C  azithromycin (ZITHROMAX Z-PAK) 250 MG tablet  Take two tablets on day one, and then one tablet daily the next four days. Patient not taking: Reported on 10/06/2021 08/11/21   Allwardt, Alyssa M, PA-C  LORazepam (ATIVAN) 1 MG tablet Take 1 tablet (1 mg total) by mouth 2 (two) times daily as needed for anxiety. Patient not taking: Reported on 10/06/2021 01/15/19   Briscoe Deutscher, DO     Critical care time: NA     Baltazar Apo, MD, PhD 10/08/2021, 4:46 PM [facility] [phone] or if no answer before 7:00PM call [phone] For any issues after 7:00PM please call eLink [phone]

## 2021-10-08 NOTE — Progress Notes (Signed)
PROGRESS NOTE    Christopher Randall  RSW:[id number] DOB: 1961-04-01 DOA: 10/06/2021 PCP: Pcp, No    Brief Narrative:  [name] was admitted to the hospital with the working diagnosis of new brain metastasis.    60 year old male past medical history for melanoma remission, treated in 2012 who presents with left-sided weakness.  He reported 3-4 days of left lower extremity weakness, that progressed into left upper extremity weakness within 24 hours of him coming to the hospital.  Because of worsening neurologic symptoms he came for further evaluation to the emergency room.  On his initial physical examination his blood pressure 141/88, heart rate 82, respiratory rate 17, temperature 98.5, oxygen saturation 99%, his lungs are clear to auscultation bilaterally, heart S1-S2, present, rhythmic, abdomen soft nontender, no lower extremity edema, 4-5 strength left upper and lower extremity.   Sodium 137, potassium 3.8, chloride 1 1, bicarb 27, glucose 87, BUN 12, creatinine 0.46, white count 11.0, hemoglobin 12.5, hematocrit 38.0, platelets 458. SARS COVID-19 negative.   Head CT with vasogenic edema in the frontal lobes, extensive on the right.  Concerning for metastatic disease. Brain MRI with multiple peripherally enhancing intracranial lesions, largest of which appears to be posterior frontal dural based 2 cm on the right side with surrounding edema.  2 additional peripheral enhancing lesions in the left frontal and parietal lobes.   Chest radiograph with a persona right lobe well delineated opacity, clear margins.   EKG 83 bpm, normal axis, normal intervals, sinus rhythm, no significant ST segment T wave changes.   Patient was placed on systemic corticosteroids.  Neurosurgery was consulted, recommendations to proceed with radiation therapy.  Chest ct with right hilar mass which narrows right middle artery amd bronchus with post obstructive atelectasis.    Assessment & Plan:   Principal  Problem:   Brain metastasis ([place]) Active Problems:   Left-sided weakness   Brain metastases ([facility])   Brain metastasis (new). Multiple metastatic lesions, larger right frontal dural bases 2 cm. Patient with left sided weakness and decreased memory.   On dexamethasone for brain edema. Consulted radiation oncology for brain radiation.  Consulted pulmonary for possible bronch.    2. History of melanoma.  Had treatment in [name] 2001.  Has been on remission    3. Tobacco use.  Continue with smoking cessation.  4. Steroid induced hyperglycemia.  Continue with insulin sliding scale for glucose cover and monitoring Patient is tolerating po well, continue with systemic steroids for brain edema.   Patient continue to be at high risk for worsening brain mets   Status is: Inpatient  Remains inpatient appropriate because: inpatient workup    DVT prophylaxis: Enoxaparin   Code Status:    full  Family Communication:   I spoke with patient's wife at the bedside, we talked in detail about patient's condition, plan of care and prognosis and all questions were addressed.     Consultants:  Neurosurgery  Radiation oncology  Pulmonary     Subjective: Patient with no nausea or vomiting, continue to have left sided weakness and decreased memory, no nausea or vomiting, no headache.   Objective: Vitals:   10/07/21 1832 10/07/21 2128 10/08/21 0534 10/08/21 0723  BP: (!) 143/85 (!) 145/86 (!) 146/91 (!) 148/105  Pulse: (!) 103 (!) 103 77 72  Resp: 18 18 17 17   Temp: 97.9 F (36.6 C) (!) 97.5 F (36.4 C) (!) 97.5 F (36.4 C) (!) 97.4 F (36.3 C)  TempSrc:    Oral  SpO2: 98% 99% 97% 100%  Weight:      Height:        Intake/Output Summary (Last 24 hours) at 10/08/2021 1115 Last data filed at 10/08/2021 7517 Gross per 24 hour  Intake 1230 ml  Output --  Net 1230 ml   Filed Weights   10/06/21 1644  Weight: 81.2 kg    Examination:   General: Not in pain or dyspnea./   Neurology: Awake and alert, non focal  E ENT: no pallor, no icterus, oral mucosa moist Cardiovascular: No JVD. S1-S2 present, rhythmic, no gallops, rubs, or murmurs. No lower extremity edema. Pulmonary: positive breath sounds bilaterally, adequate air movement, no wheezing, rhonchi or rales. Gastrointestinal. Abdomen soft and non tender Skin. No rashes Musculoskeletal: no joint deformities     Data Reviewed: I have personally reviewed following labs and imaging studies  CBC: Recent Labs  Lab 10/06/21 1812 10/07/21 0425  WBC 11.0* 8.4  NEUTROABS 6.7  --   HGB 12.5* 14.3  HCT 38.0* 42.8  MCV 93.1 93.2  PLT 458* 001*   Basic Metabolic Panel: Recent Labs  Lab 10/06/21 1812 10/07/21 0425  NA 137 135  K 3.8 3.9  CL 101 101  CO2 27 26  GLUCOSE 87 244*  BUN 12 14  CREATININE 0.46* 0.67  CALCIUM 9.1 8.9   GFR: Estimated Creatinine Clearance: 99.4 mL/min (by C-G formula based on SCr of 0.67 mg/dL). Liver Function Tests: Recent Labs  Lab 10/06/21 1812  AST 21  ALT 27  ALKPHOS 57  BILITOT 0.2*  PROT 7.0  ALBUMIN 3.6   No results for input(s): LIPASE, AMYLASE in the last 168 hours. No results for input(s): AMMONIA in the last 168 hours. Coagulation Profile: No results for input(s): INR, PROTIME in the last 168 hours. Cardiac Enzymes: No results for input(s): CKTOTAL, CKMB, CKMBINDEX, TROPONINI in the last 168 hours. BNP (last 3 results) No results for input(s): PROBNP in the last 8760 hours. HbA1C: Recent Labs    10/07/21 0425  HGBA1C 5.5   CBG: Recent Labs  Lab 10/07/21 0722 10/07/21 1130 10/07/21 1636 10/07/21 2141 10/08/21 0734  GLUCAP 163* 120* 110* 200* 122*   Lipid Profile: No results for input(s): CHOL, HDL, LDLCALC, TRIG, CHOLHDL, LDLDIRECT in the last 72 hours. Thyroid Function Tests: No results for input(s): TSH, T4TOTAL, FREET4, T3FREE, THYROIDAB in the last 72 hours. Anemia Panel: No results for input(s): VITAMINB12, FOLATE, FERRITIN,  TIBC, IRON, RETICCTPCT in the last 72 hours.    Radiology Studies: I have reviewed all of the imaging during this hospital visit personally     Scheduled Meds:  dexamethasone (DECADRON) injection  6 mg Intravenous Q6H   insulin aspart  0-9 Units Subcutaneous TID WC   nicotine  21 mg Transdermal Once   pantoprazole  40 mg Oral Daily   Continuous Infusions:   LOS: 1 day        Kyeshia Zinn Gerome Apley, MD

## 2021-10-08 NOTE — Consult Note (Signed)
NAME:  Christopher Randall, MRN:  [id number], DOB:  1961-10-02, LOS: 1 ADMISSION DATE:  10/06/2021, CONSULTATION DATE: 10/08/2021 REFERRING MD: Dr. Cathlean Sauer, CHIEF COMPLAINT: Right middle lobe mass  History of Present Illness:  60 year old man, history of tobacco use, history of melanoma in remission (2012).  He was admitted with left-sided weakness.  Head CT and then MRI brain confirmed multiple peripherally enhancing intracranial lesions.  Has been treated with corticosteroids and is planning to meet radiation oncology.  CT scan of the chest performed 10/07/2021 showed a mixed density right middle lobe mass, mixed density mediastinal and subcarinal adenopathy, question with some areas of necrosis.  PCCM consulted regarding tissue diagnosis for presumed malignancy.  He denies dyspnea.  He does have some cough that has been ongoing for a few months.  Some intermittent purulent sputum but no hemoptysis.  Denies any chest pain.  He has been working on cutting down his cigarettes.  Has not stopped completely.  Pertinent  Medical History   Past Medical History:  Diagnosis Date   Abnormal TSH 09/28/2017   Anxiety 07/03/2012   Depression    Difficulty sleeping 09/28/2017   Examination of participant in clinical trial 09/11/2012   History of immunotherapy 02/23/2016   Melanoma of skin 10/26/2011   Nicotine dependence 07/17/2015   Smoker      Significant Hospital Events: Including procedures, antibiotic start and stop dates in addition to other pertinent events   CT chest 10/07/2021  > right hilar mass that extends into the right middle lobe, obstructs the posterior segmental airway of the right middle lobe.  There is supraclavicular, mediastinal and right hilar adenopathy.  Multiple groundglass nodular opacities are present, question whether this is metastatic versus another inflammatory process.  Interim History / Subjective:   Beginning to feel some better.  He still has some cough.  Left-sided  strength improved somewhat  Objective   Blood pressure 136/79, pulse 79, temperature (!) 97.4 F (36.3 C), temperature source Oral, resp. rate 18, height 5\' 9"  (1.753 m), weight 81.2 kg, SpO2 99 %.        Intake/Output Summary (Last 24 hours) at 10/08/2021 1636 Last data filed at 10/08/2021 1400 Gross per 24 hour  Intake 1470 ml  Output --  Net 1470 ml   Filed Weights   10/06/21 1644  Weight: 81.2 kg    Examination: General: Well-appearing man, no distress HENT: Oropharynx clear, no stridor, pupils equal, hearing aids in place Lungs: Clear bilaterally, no wheezes or crackles Cardiovascular: Regular, no murmur Abdomen: Nondistended, positive bowel sounds Extremities: No edema Neuro: Awake, alert, interacting appropriately, nonfocal  Resolved Hospital Problem list     Assessment & Plan:  Right middle lobe mass extending into the right hilum and impacting the right middle lobe airways, right mid lobe pulmonary artery.  He also has subcarinal and mediastinal adenopathy.  Tissue diagnosis will be important here.  I suspect primary lung cancer, possibly small cell based on central necrosis of his lesions.  This would be an unusual place to see metastatic melanoma. -Believe the most straightforward approach to achieve tissue diagnosis is bronchoscopy with biopsies.  It looks like there should be an endobronchial lesion in the posterior segment of the right middle lobe.  There is some mixed density to these lesions, may be some necrosis which could impair pathological evaluation.  If nondiagnostic then could consider surgical resection or needle biopsy of a supraclavicular node. -We will arrange for bronchoscopy with biopsies under anesthesia by Dr. Chase Caller  on 10/20.  Discussed the procedure in full with the patient and his wife.  They understand the benefits, risks and agree to proceed.  Labs   CBC: Recent Labs  Lab 10/06/21 1812 10/07/21 0425  WBC 11.0* 8.4  NEUTROABS 6.7  --    HGB 12.5* 14.3  HCT 38.0* 42.8  MCV 93.1 93.2  PLT 458* 456*    Basic Metabolic Panel: Recent Labs  Lab 10/06/21 1812 10/07/21 0425  NA 137 135  K 3.8 3.9  CL 101 101  CO2 27 26  GLUCOSE 87 244*  BUN 12 14  CREATININE 0.46* 0.67  CALCIUM 9.1 8.9   GFR: Estimated Creatinine Clearance: 99.4 mL/min (by C-G formula based on SCr of 0.67 mg/dL). Recent Labs  Lab 10/06/21 1812 10/07/21 0425  WBC 11.0* 8.4    Liver Function Tests: Recent Labs  Lab 10/06/21 1812  AST 21  ALT 27  ALKPHOS 57  BILITOT 0.2*  PROT 7.0  ALBUMIN 3.6   No results for input(s): LIPASE, AMYLASE in the last 168 hours. No results for input(s): AMMONIA in the last 168 hours.  ABG No results found for: PHART, PCO2ART, PO2ART, HCO3, TCO2, ACIDBASEDEF, O2SAT   Coagulation Profile: No results for input(s): INR, PROTIME in the last 168 hours.  Cardiac Enzymes: No results for input(s): CKTOTAL, CKMB, CKMBINDEX, TROPONINI in the last 168 hours.  HbA1C: Hgb A1c MFr Bld  Date/Time Value Ref Range Status  10/07/2021 04:25 AM 5.5 4.8 - 5.6 % Final    Comment:    (NOTE) Pre diabetes:          5.7%-6.4%  Diabetes:              >6.4%  Glycemic control for   <7.0% adults with diabetes     CBG: Recent Labs  Lab 10/07/21 1636 10/07/21 2141 10/08/21 0734 10/08/21 1121 10/08/21 1628  GLUCAP 110* 200* 122* 90 85    Review of Systems:   As per HPi  Past Medical History:  He,  has a past medical history of Abnormal TSH (09/28/2017), Anxiety (07/03/2012), Depression, Difficulty sleeping (09/28/2017), Examination of participant in clinical trial (09/11/2012), History of immunotherapy (02/23/2016), Melanoma of skin (10/26/2011), Nicotine dependence (07/17/2015), and Smoker.   Surgical History:   Past Surgical History:  Procedure Laterality Date   LEFT POPLITEAL MASS RESECTION  08/27/2011   pigmented villonodular synovitis, negative for malignancy   LYMPH GLAND EXCISION  07/12/2011   right  axilla, consistent with melanoma   LYMPH NODE DISSECTION  08/27/2011   right, 0/17 nodes positive     Social History:   reports that he has been smoking cigarettes. He has been smoking an average of .25 packs per day. He has never used smokeless tobacco. He reports current alcohol use of about 6.0 standard drinks per week. He reports that he does not use drugs.   Family History:  His family history includes CAD in his mother; Hypertension in his mother; Lung cancer in his father.   Allergies Allergies  Allergen Reactions   Iopamidol Hives and Itching    Isovue break through with prep, Hives and Itching  Patient had PET?CT scan with 125 mls of Isovue-300. Noted to have two raised itchy hives which self-resolved. Patient had PET/CT scan with contrast on 05/27/2014 while on prednisone prep (3 doses of 50 mg-13 hour prep) and noted to have two hives which self -resolved without treatment   Iodinated Diagnostic Agents Rash   Iodine-131 Rash  Home Medications  Prior to Admission medications   Medication Sig Start Date End Date Taking? Authorizing Provider  Diclofenac Sodium 1.5 % SOLN Place one application on to the skin 2x daily. Patient taking differently: Place one application on to the skin twice a day as needed for muscle cramps or aches 01/15/19  Yes Briscoe Deutscher, DO  Multiple Vitamins-Minerals (ICAPS AREDS 2 PO) Take 1 capsule by mouth daily.   Yes [provider]  nicotine polacrilex (COMMIT) 4 MG lozenge Take 4 mg by mouth 2 (two) times daily as needed for smoking cessation.   Yes [provider]  OVER THE COUNTER MEDICATION Natural Calm Magnesium Powder: Dissolve 1 teaspoon into 8 oz beverage and drink daily   Yes [provider]  amoxicillin-clavulanate (AUGMENTIN) 875-125 MG tablet Take 1 tablet by mouth 2 (two) times daily. Patient not taking: Reported on 10/06/2021 08/11/21   Allwardt, Randa Evens, PA-C  azithromycin (ZITHROMAX Z-PAK) 250 MG tablet  Take two tablets on day one, and then one tablet daily the next four days. Patient not taking: Reported on 10/06/2021 08/11/21   Allwardt, Alyssa M, PA-C  LORazepam (ATIVAN) 1 MG tablet Take 1 tablet (1 mg total) by mouth 2 (two) times daily as needed for anxiety. Patient not taking: Reported on 10/06/2021 01/15/19   Briscoe Deutscher, DO     Critical care time: NA     Baltazar Apo, MD, PhD 10/08/2021, 4:46 PM [facility] [phone] or if no answer before 7:00PM call [phone] For any issues after 7:00PM please call eLink [phone]

## 2021-10-08 NOTE — Progress Notes (Signed)
Given the results of the chest CT, it is unclear whether the patient's chest mass represents a primary lung carcinoma verses remote metastatic disease from his melanoma.  I think that obtaining a biopsy of the chest lesion is our best way to determine the tissue type an origin of his metastatic disease.  If the patient has metastatic melanoma or newly diagnosed metastatic lung carcinoma my treatment recommendations with regard to the brain metastasis would not change however.  I still think that these lesions would be best treated with stereotactic radio surgery.

## 2021-10-09 ENCOUNTER — Other Ambulatory Visit: Payer: Self-pay

## 2021-10-09 ENCOUNTER — Inpatient Hospital Stay (HOSPITAL_COMMUNITY): Payer: BC Managed Care – PPO

## 2021-10-09 ENCOUNTER — Inpatient Hospital Stay (HOSPITAL_COMMUNITY): Payer: BC Managed Care – PPO | Admitting: Certified Registered"

## 2021-10-09 ENCOUNTER — Encounter (HOSPITAL_COMMUNITY): Payer: Self-pay | Admitting: Internal Medicine

## 2021-10-09 ENCOUNTER — Ambulatory Visit
Admit: 2021-10-09 | Discharge: 2021-10-09 | Disposition: A | Discharge status: Home / Self Care | Payer: BC Managed Care – PPO | Attending: Radiation Oncology | Admitting: Radiation Oncology

## 2021-10-09 ENCOUNTER — Encounter (HOSPITAL_COMMUNITY)
Admission: EM | Disposition: A | Discharge status: Home / Self Care | Payer: Self-pay | Source: Home / Self Care | Attending: Internal Medicine

## 2021-10-09 DIAGNOSIS — C7931 Secondary malignant neoplasm of brain: Secondary | ICD-10-CM

## 2021-10-09 DIAGNOSIS — C3490 Malignant neoplasm of unspecified part of unspecified bronchus or lung: Secondary | ICD-10-CM

## 2021-10-09 DIAGNOSIS — R590 Localized enlarged lymph nodes: Secondary | ICD-10-CM | POA: Diagnosis not present

## 2021-10-09 DIAGNOSIS — R531 Weakness: Secondary | ICD-10-CM | POA: Diagnosis not present

## 2021-10-09 DIAGNOSIS — R918 Other nonspecific abnormal finding of lung field: Secondary | ICD-10-CM | POA: Diagnosis not present

## 2021-10-09 HISTORY — PX: FINE NEEDLE ASPIRATION: SHX5430

## 2021-10-09 HISTORY — PX: BRONCHIAL BRUSHINGS: SHX5108

## 2021-10-09 HISTORY — PX: BRONCHIAL BIOPSY: SHX5109

## 2021-10-09 HISTORY — PX: ENDOBRONCHIAL ULTRASOUND: SHX5096

## 2021-10-09 HISTORY — PX: VIDEO BRONCHOSCOPY: SHX5072

## 2021-10-09 LAB — GLUCOSE, CAPILLARY
Glucose-Capillary: 125 mg/dL — ABNORMAL HIGH (ref 70–99)
Glucose-Capillary: 152 mg/dL — ABNORMAL HIGH (ref 70–99)

## 2021-10-09 IMAGING — DX DG CHEST 1V PORT
2 series · 2 of 2 positions shown · non-contrast
Comparison: Radiographs [DATE] and [DATE].  CT [DATE].

CLINICAL DATA: Respiratory failure.

EXAM:
PORTABLE CHEST 1 VIEW

[chest ap (1 of 2)]
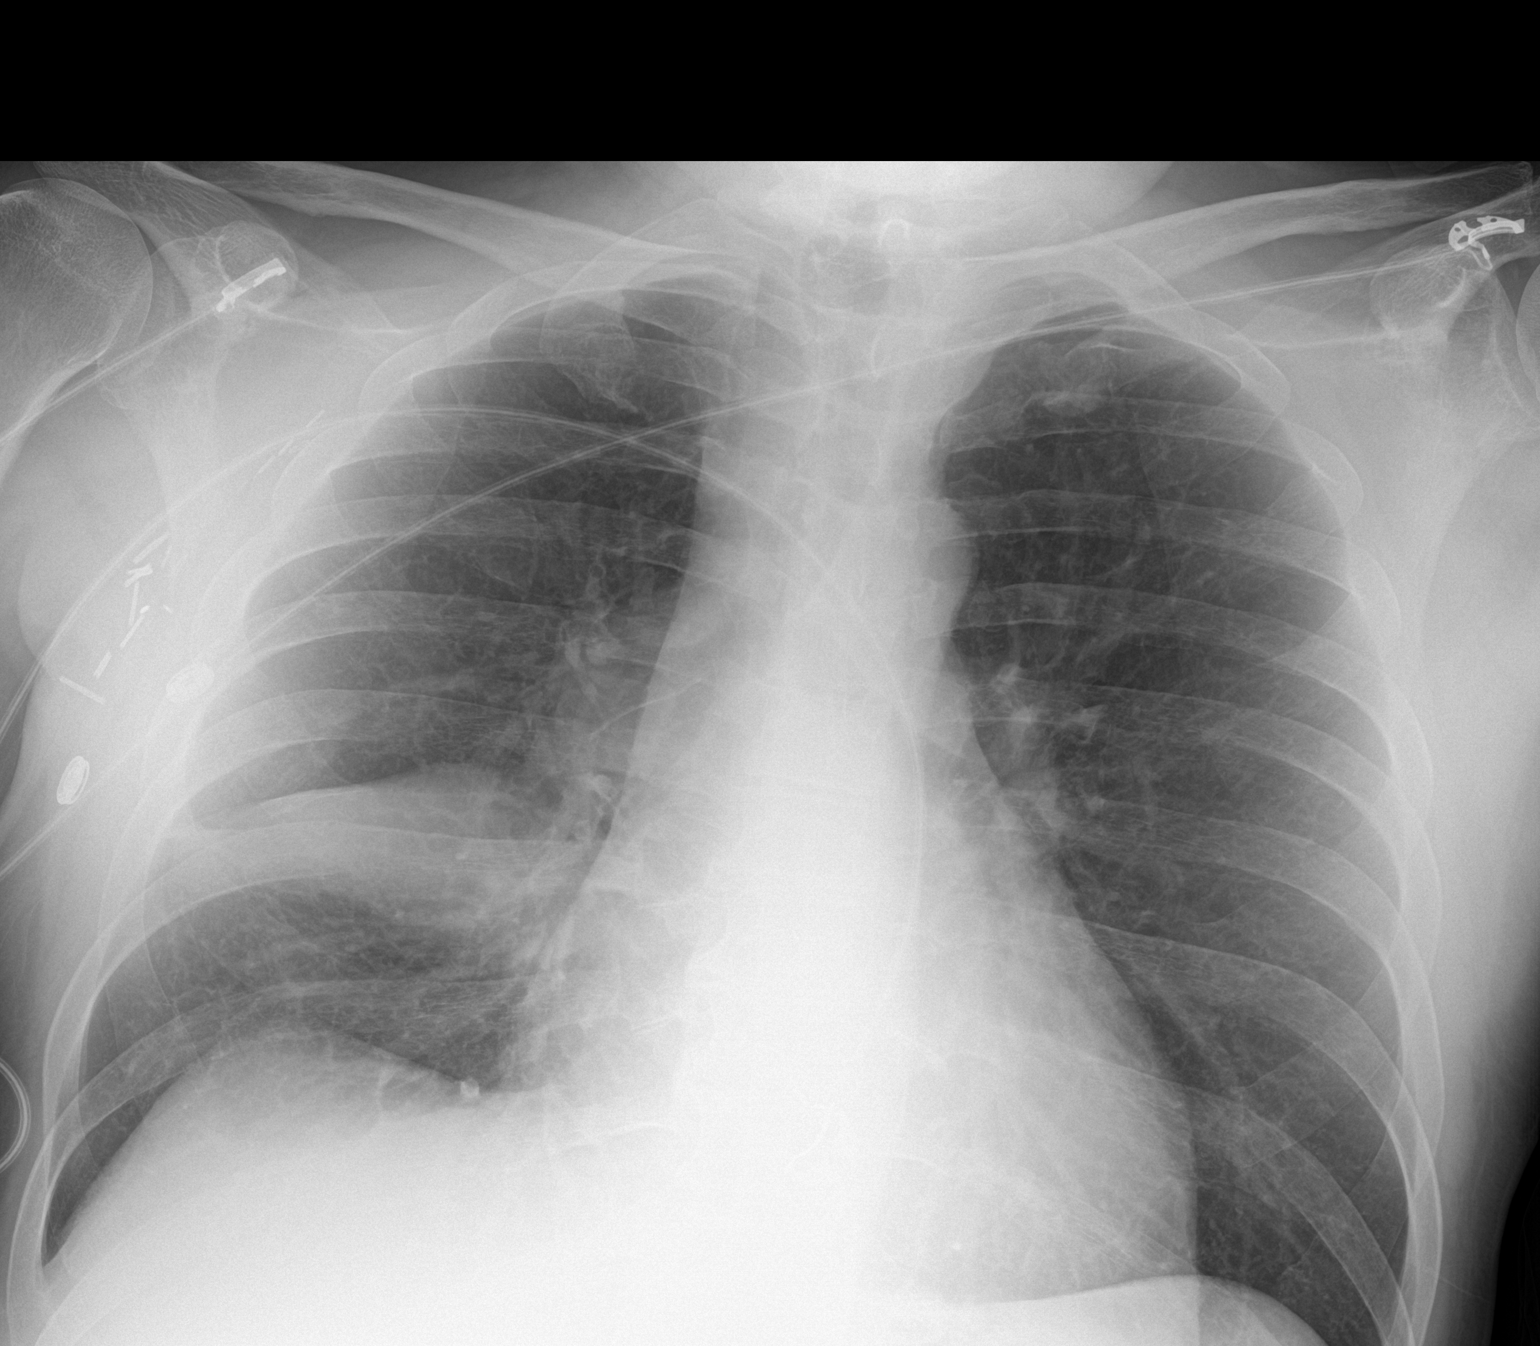

[chest ap (2 of 2)]
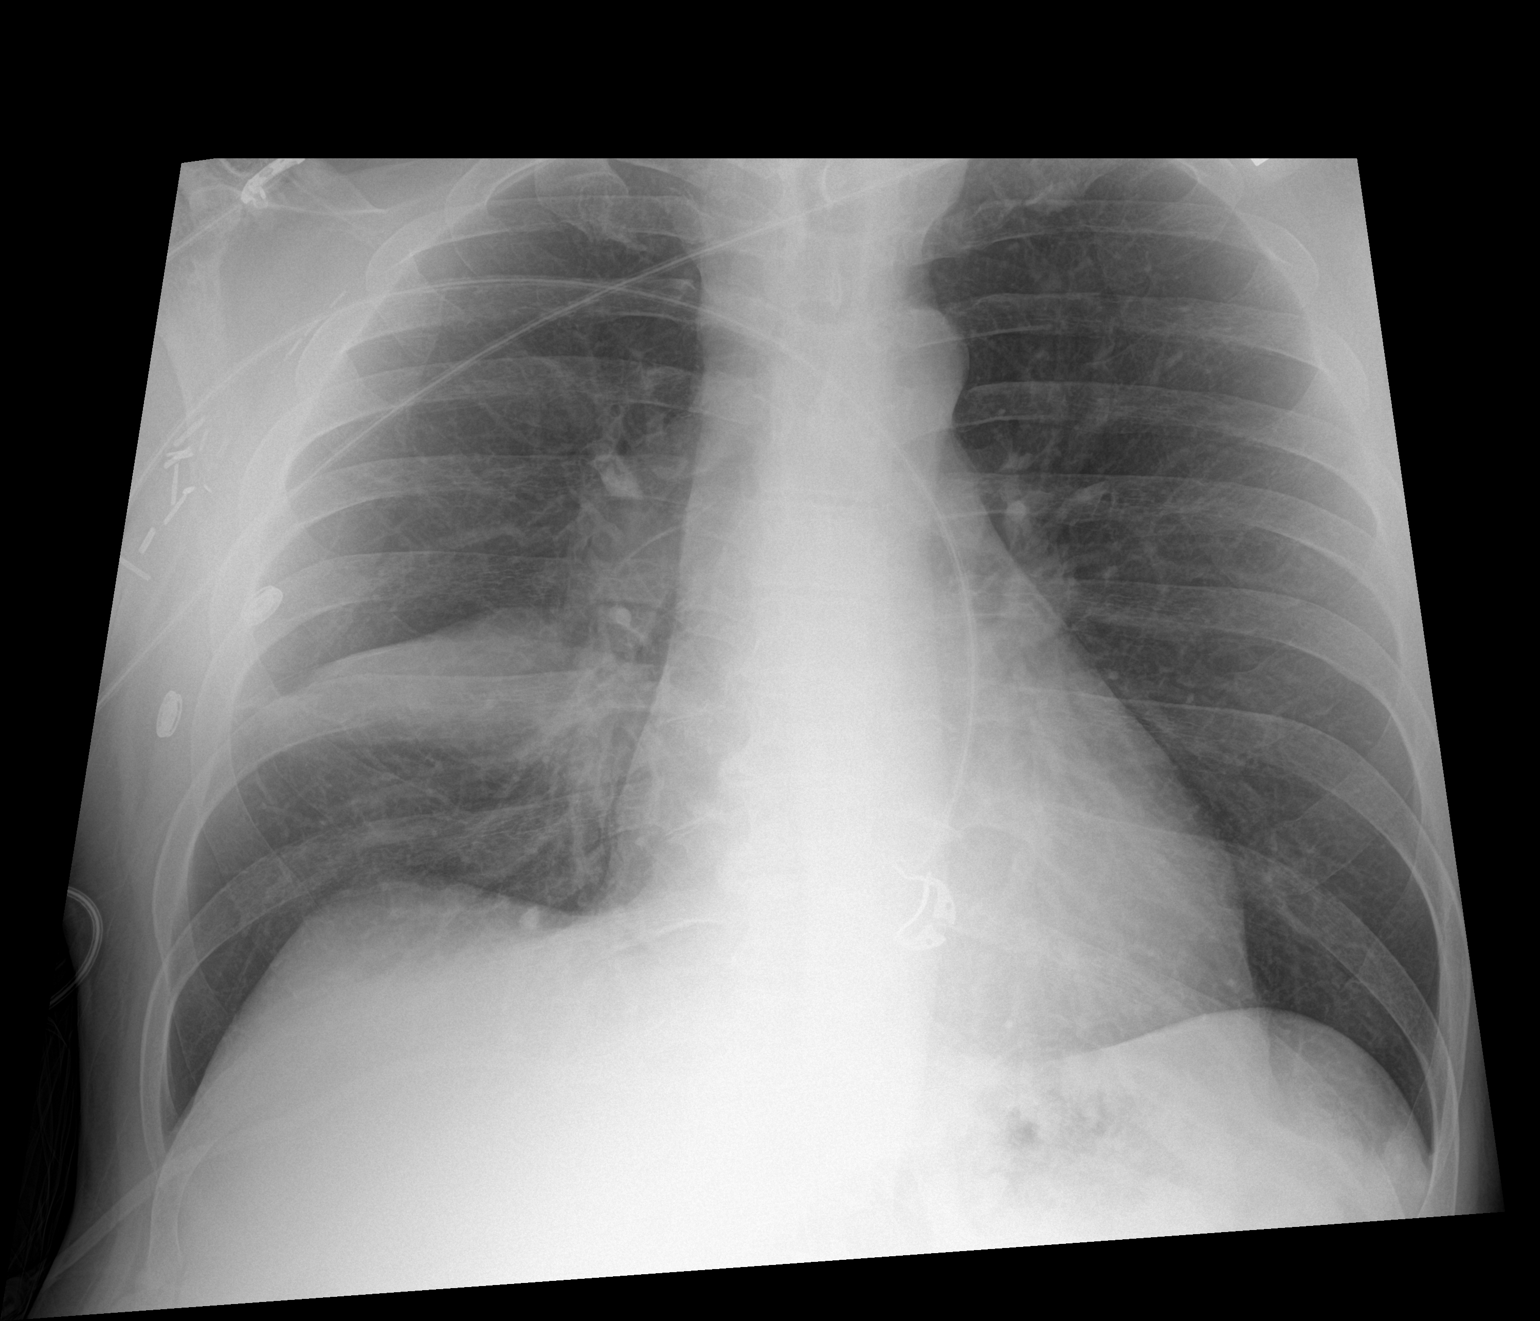

[2 of 2 positions shown; findings below may reference images not displayed]

FINDINGS: [KN] hours. Two views obtained. The heart size and mediastinal
contours are stable, with subtle evidence of mediastinal and right
hilar adenopathy. There is persistent right middle lobe partial
collapse and opacity, similar to recent prior studies. The left lung
is clear. There is no pleural effusion or pneumothorax. No discrete
peripheral lung lesions are identified. Surgical clips are present
within the right axilla.
IMPRESSION: 1. Stable findings of mediastinal adenopathy, right hilar mass and
right middle lobe collapse compared with recent prior studies. Based
on the CT, findings are consistent with metastatic disease.
2. No acute superimposed process identified.

## 2021-10-09 SURGERY — ENDOBRONCHIAL ULTRASOUND (EBUS)
Anesthesia: General

## 2021-10-09 MED ORDER — LACTATED RINGERS IV SOLN
INTRAVENOUS | Status: DC
Start: 2021-10-09 — End: 2021-10-10

## 2021-10-09 MED ORDER — ONDANSETRON HCL 4 MG/2ML IJ SOLN
INTRAMUSCULAR | Status: DC | PRN
  Administered 2021-10-09: 4 mg via INTRAVENOUS

## 2021-10-09 MED ORDER — PHENYLEPHRINE 40 MCG/ML (10ML) SYRINGE FOR IV PUSH (FOR BLOOD PRESSURE SUPPORT)
PREFILLED_SYRINGE | INTRAVENOUS | Status: DC | PRN
  Administered 2021-10-09: 40 ug via INTRAVENOUS
  Administered 2021-10-09: 80 ug via INTRAVENOUS
  Administered 2021-10-09: 40 ug via INTRAVENOUS
  Administered 2021-10-09: 80 ug via INTRAVENOUS
  Administered 2021-10-09 (×2): 40 ug via INTRAVENOUS
  Administered 2021-10-09: 80 ug via INTRAVENOUS

## 2021-10-09 MED ORDER — LIDOCAINE 2% (20 MG/ML) 5 ML SYRINGE
INTRAMUSCULAR | Status: DC | PRN
  Administered 2021-10-09: 80 mg via INTRAVENOUS

## 2021-10-09 MED ORDER — ROCURONIUM BROMIDE 100 MG/10ML IV SOLN
INTRAVENOUS | Status: DC | PRN
  Administered 2021-10-09: 50 mg via INTRAVENOUS

## 2021-10-09 MED ORDER — SUGAMMADEX SODIUM 200 MG/2ML IV SOLN
INTRAVENOUS | Status: DC | PRN
  Administered 2021-10-09: 200 mg via INTRAVENOUS

## 2021-10-09 MED ORDER — DEXAMETHASONE SODIUM PHOSPHATE 10 MG/ML IJ SOLN
INTRAMUSCULAR | Status: DC | PRN
  Administered 2021-10-09: 10 mg via INTRAVENOUS

## 2021-10-09 MED ORDER — PROPOFOL 500 MG/50ML IV EMUL
INTRAVENOUS | Status: AC
  Filled 2021-10-09: qty 50

## 2021-10-09 MED ORDER — PROPOFOL 10 MG/ML IV BOLUS
INTRAVENOUS | Status: DC | PRN
  Administered 2021-10-09: 150 mg via INTRAVENOUS
  Administered 2021-10-09: 20 mg via INTRAVENOUS

## 2021-10-09 MED ORDER — FENTANYL CITRATE (PF) 100 MCG/2ML IJ SOLN
INTRAMUSCULAR | Status: DC | PRN
  Administered 2021-10-09 (×2): 50 ug via INTRAVENOUS

## 2021-10-09 MED ORDER — FENTANYL CITRATE (PF) 100 MCG/2ML IJ SOLN
INTRAMUSCULAR | Status: AC
  Filled 2021-10-09: qty 2

## 2021-10-09 MED ORDER — MIDAZOLAM HCL 2 MG/2ML IJ SOLN
INTRAMUSCULAR | Status: AC
  Filled 2021-10-09: qty 2

## 2021-10-09 MED ORDER — PROPOFOL 10 MG/ML IV BOLUS
INTRAVENOUS | Status: AC
  Filled 2021-10-09: qty 20

## 2021-10-09 MED ORDER — LIDOCAINE HCL 1 % IJ SOLN
INTRAMUSCULAR | Status: AC
  Filled 2021-10-09: qty 20

## 2021-10-09 MED ORDER — DEXMEDETOMIDINE (PRECEDEX) IN NS 20 MCG/5ML (4 MCG/ML) IV SYRINGE
PREFILLED_SYRINGE | INTRAVENOUS | Status: AC
  Filled 2021-10-09: qty 20

## 2021-10-09 MED ORDER — MIDAZOLAM HCL 5 MG/5ML IJ SOLN
INTRAMUSCULAR | Status: DC | PRN
  Administered 2021-10-09: 2 mg via INTRAVENOUS

## 2021-10-09 NOTE — Anesthesia Procedure Notes (Signed)
Procedure Name: Intubation Date/Time: 10/09/2021 11:38 AM Performed by: Gwyndolyn Saxon, CRNA Pre-anesthesia Checklist: Patient identified, Emergency Drugs available, Suction available and Patient being monitored Patient Re-evaluated:Patient Re-evaluated prior to induction Oxygen Delivery Method: Circle system utilized Preoxygenation: Pre-oxygenation with 100% oxygen Induction Type: IV induction Ventilation: Mask ventilation without difficulty Laryngoscope Size: Miller and 2 Grade View: Grade I Tube type: Oral Tube size: 8.5 mm Number of attempts: 1 Airway Equipment and Method: Patient positioned with wedge pillow and Stylet Placement Confirmation: ETT inserted through vocal cords under direct vision, positive ETCO2 and breath sounds checked- equal and bilateral Secured at: 23 cm Tube secured with: Tape Dental Injury: Teeth and Oropharynx as per pre-operative assessment

## 2021-10-09 NOTE — Op Note (Signed)
Flexible and EBUS Bronchoscopy Procedure Note  Christopher Randall  [id number]  03/06/1961  Date:10/09/21  Time:12:45 PM   Provider Performing:Keyon Winnick A Bostyn Bogie   Procedure: Flexible bronchoscopy and EBUS Bronchoscopy  Indication(s) Right lung mass, mediastinal adenopathy no samples taken at this time  Consent Risks of the procedure as well as the alternatives and risks of each were explained to the patient and/or caregiver.  Consent for the procedure was obtained.  Anesthesia General Anesthesia   Time Out Verified patient identification, verified procedure, site/side was marked, verified correct patient position, special equipment/implants available, medications/allergies/relevant history reviewed, required imaging and test results available.   Sterile Technique Usual hand hygiene, masks, gowns, and gloves were used   Procedure Description Diagnostic bronchoscope advanced through endotracheal tube and into airway.  Airways were examined down to subsegmental level with findings noted below.  Following diagnostic evaluation, no samples taken at this time.  The diagnostic bronchoscope was then removed and the EBUS bronchoscope was advanced into airway with stations no samples taken at this time biopsied and sent for slide, cell block, and/or culture.  The EBUS bronchoscope was removed after assuring no active bleeding from biopsy site.  Findings: Narrowing of the right middle lobe lateral segment  Aspiration needle of 10 R, subcarina and 4R -3 attempts at each station  Cytology right middle lobe, transbronchial biopsies right middle lobe, washings right middle lobe   Complications/Tolerance None; patient tolerated the procedure well. Chest X-ray is needed post procedure.   EBL Minimal   Specimen(s) Needle aspiration 10 R Needle aspiration subcarinal  needle aspiration 4 R  Cytology right middle lobe Brushings right middle lobe Transbronchial biopsies right middle  lobe  Tolerated procedure well

## 2021-10-09 NOTE — Interval H&P Note (Signed)
History and Physical Interval Note:  10/09/2021 10:43 AM  Christopher Randall  has presented today for surgery, with the diagnosis of RML mass.  The various methods of treatment have been discussed with the patient and family. After consideration of risks, benefits and other options for treatment, the patient has consented to  Procedure(s): ENDOBRONCHIAL ULTRASOUND (N/A) VIDEO BRONCHOSCOPY WITHOUT FLUORO (N/A) as a surgical intervention.  The patient's history has been reviewed, patient examined, no change in status, stable for surgery.  I have reviewed the patient's chart and labs.  Questions were answered to the patient's satisfaction.     [name] A Geralyn Figiel

## 2021-10-09 NOTE — Op Note (Signed)
[facility] Cardiopulmonary Patient Name: Christopher Randall Procedure Date: 10/09/2021 MRN: [id number] Attending MD: [name] MD, MD Date of Birth: [birth year] CSN: [id number] Age: 60 Admit Type: Inpatient Ethnicity: Not Hispanic or Latino Procedure:             Bronchoscopy Indications:           Hilar lymphadenopathy of the right side, Mediastinal                         adenopathy Providers:             Luan Urbani A. Ander Slade MD, MD, Particia Nearing, RN, Frazier Richards, Technician, Cletis Athens, Technician Referring MD:           Medicines:             General Anesthesia Complications:         No immediate complications Estimated Blood Loss:  Estimated blood loss was minimal. Procedure:      Pre-Anesthesia Assessment:      - The anesthesia plan was to use general anesthesia.      After obtaining informed consent, the bronchoscope was passed under       direct vision. Throughout the procedure, the patient's blood pressure,       pulse, and oxygen saturations were monitored continuously. the BF-H190       ([id number]) Olympus bronchoscope was introduced through the mouth, via the       endotracheal tube and advanced to the tracheobronchial tree. The       procedure was accomplished without difficulty. The patient tolerated the       procedure well. Findings:      The laryngeal mask airway is in good position. The vocal cords appear       normal. The subglottic space is normal. The trachea is of normal       caliber. The carina is sharp. The tracheobronchial tree was examined to       at least the first subsegmental level. Bronchial mucosa and anatomy are       normal; there are no endobronchial lesions, and no secretions.      The scope was withdrawn and replaced with the EBUS bronchoscope to       accomplish the ultrasound examination.      Lymph Nodes:      10R biopsied      4R biopsied      subcarina      Lymph Nodes: An endobronchial  ultrasound-guided transbronchial needle       aspiration was performed      biopsies RML, washings RML, Brushing RML Impression:      - Hilar lymphadenopathy of the right side      - Mediastinal adenopathy      - The airway examination was normal.      - Endobronchial ultrasound was performed.      - Systematic lymph node staging was performed      - A transbronchial needle aspiration was performed.      - Preliminary cytology was suggestive od non small cell lung cancer       (final results are pending). Moderate Sedation:      An independent trained observer was present and continuously monitored       the patient. Recommendation:      -  Await biopsy, brushing and cytology results. Procedure Code(s):      --- Professional ---      [phone], Bronchoscopy, rigid or flexible, including fluoroscopic guidance,       when performed; with endobronchial ultrasound (EBUS) guided       transtracheal and/or transbronchial sampling (eg,       aspiration[s]/biopsy[ies]), one or two mediastinal and/or hilar lymph       node stations or structures      13244, Bronchoscopy, rigid or flexible, including fluoroscopic guidance,       when performed; with transendoscopic endobronchial ultrasound (EBUS)       during bronchoscopic diagnostic or therapeutic intervention(s) for       peripheral lesion(s) (List separately in addition to code for primary       procedure[s]) Diagnosis Code(s):      --- Professional ---      R59.0, Localized enlarged lymph nodes      R09.89, Other specified symptoms and signs involving the circulatory and       respiratory systems CPT copyright 2019 [employer]. All rights reserved. The codes documented in this report are preliminary and upon coder review may  be revised to meet current compliance requirements. Sherrilyn Rist, MD [name] MD, MD 10/09/2021 12:59:42 PM This report has been signed electronically. Number of Addenda: 0 Scope  In: Scope Out:

## 2021-10-09 NOTE — Progress Notes (Addendum)
PROGRESS NOTE    Christopher Randall  HKV:[id number] DOB: 1961-09-20 DOA: 10/06/2021 PCP: Pcp, No    Brief Narrative:  [name] was admitted to the hospital with the working diagnosis of new brain metastasis. 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    60 year old male past medical history for melanoma remission, treated in 2012 who presents with left-sided weakness.  He reported 3-4 days of left lower extremity weakness, that progressed into left upper extremity weakness within 24 hours of him coming to the hospital.  Because of worsening neurologic symptoms he came for further evaluation to the emergency room.  On his initial physical examination his blood pressure 141/88, heart rate 82, respiratory rate 17, temperature 98.5, oxygen saturation 99%, his lungs are clear to auscultation bilaterally, heart S1-S2, present, rhythmic, abdomen soft nontender, no lower extremity edema, 4-5 strength left upper and lower extremity.   Sodium 137, potassium 3.8, chloride 1 1, bicarb 27, glucose 87, BUN 12, creatinine 0.46, white count 11.0, hemoglobin 12.5, hematocrit 38.0, platelets 458. SARS COVID-19 negative.   Head CT with vasogenic edema in the frontal lobes, extensive on the right.  Concerning for metastatic disease. Brain MRI with multiple peripherally enhancing intracranial lesions, largest of which appears to be posterior frontal dural based 2 cm on the right side with surrounding edema.  2 additional peripheral enhancing lesions in the left frontal and parietal lobes.   Chest radiograph with a persona right lobe well delineated opacity, clear margins.   EKG 83 bpm, normal axis, normal intervals, sinus rhythm, no significant ST segment T wave changes.   Patient was placed on systemic corticosteroids.  Neurosurgery was consulted, recommendations to proceed with radiation therapy.   Chest ct with right hilar mass which narrows right middle artery amd bronchus with post obstructive  atelectasis.    Assessment & Plan:   Principal Problem:   Brain metastasis ([place]) Active Problems:   Left-sided weakness   Brain metastases ([facility])   Mass of middle lobe of right lung   Brain metastasis (new). Multiple metastatic lesions, larger right frontal dural bases 2 cm. Patient with left sided weakness and decreased memory.    Continue with dexamethasone for brain edema. Follow up with radiation oncology for further recommendations.  Patient had bronchoscopy today, with EBUS finding narrowing of the right middle lobe lateral segment.  Preliminary report positive for non small cell cancer, I have called Dr Irene Limbo from oncology for consultation.    2. History of melanoma.  Had treatment in [name] 2001.  On remission    3. Tobacco use.  Smoking cessation.   4. Steroid induced hyperglycemia.  Capillary glucose this am is 125 mg/dl, will continue insulin sliding scale for glucose cover and monitoring   Status is: Inpatient  Remains inpatient appropriate because: new diagnosis lung cancer with brain mets.    DVT prophylaxis: Enoxaparin   Code Status:    full  Family Communication:   I spoke with patient's wife and son at the bedside, we talked in detail about patient's condition, plan of care and prognosis and all questions were addressed.      Consultants:  Neurosurgery Radiation oncology  Pulmonary  Oncology   Procedures:  Bronchoscopy     Subjective: Patient with no nausea or vomiting, no headache, or chest pain, no cough.   Objective: Vitals:   10/08/21 1300 10/08/21 2120 10/09/21 0451 10/09/21 0953  BP: 136/79 (!) 158/88 (!) 141/78 (!) 132/92  Pulse: 79 71 77 73  Resp: 18 18 18 18   Temp:  (!) 97.5 F (36.4 C) 98 F (36.7 C) 97.7 F (36.5 C)  TempSrc: Oral Oral Oral Oral  SpO2:  100% 99% 100%  Weight:    81.2 kg  Height:    5\' 9"  (1.753 m)    Intake/Output Summary (Last 24 hours) at 10/09/2021 1122 Last data filed at 10/09/2021 0600 Gross per  24 hour  Intake 1200 ml  Output --  Net 1200 ml   Filed Weights   10/06/21 1644 10/09/21 0953  Weight: 81.2 kg 81.2 kg    Examination:   General: Not in pain or dyspnea  Neurology: Awake and alert, non focal  E ENT: no pallor, no icterus, oral mucosa moist Cardiovascular: No JVD. S1-S2 present, rhythmic, no gallops, rubs, or murmurs. No lower extremity edema. Pulmonary: positive breath sounds bilaterally,with no wheezing, rhonchi or rales. Gastrointestinal. Abdomen soft and non tender Skin. No rashes Musculoskeletal: no joint deformities     Data Reviewed: I have personally reviewed following labs and imaging studies  CBC: Recent Labs  Lab 10/06/21 1812 10/07/21 0425  WBC 11.0* 8.4  NEUTROABS 6.7  --   HGB 12.5* 14.3  HCT 38.0* 42.8  MCV 93.1 93.2  PLT 458* 865*   Basic Metabolic Panel: Recent Labs  Lab 10/06/21 1812 10/07/21 0425  NA 137 135  K 3.8 3.9  CL 101 101  CO2 27 26  GLUCOSE 87 244*  BUN 12 14  CREATININE 0.46* 0.67  CALCIUM 9.1 8.9   GFR: Estimated Creatinine Clearance: 99.4 mL/min (by C-G formula based on SCr of 0.67 mg/dL). Liver Function Tests: Recent Labs  Lab 10/06/21 1812  AST 21  ALT 27  ALKPHOS 57  BILITOT 0.2*  PROT 7.0  ALBUMIN 3.6   No results for input(s): LIPASE, AMYLASE in the last 168 hours. No results for input(s): AMMONIA in the last 168 hours. Coagulation Profile: No results for input(s): INR, PROTIME in the last 168 hours. Cardiac Enzymes: No results for input(s): CKTOTAL, CKMB, CKMBINDEX, TROPONINI in the last 168 hours. BNP (last 3 results) No results for input(s): PROBNP in the last 8760 hours. HbA1C: Recent Labs    10/07/21 0425  HGBA1C 5.5   CBG: Recent Labs  Lab 10/08/21 0734 10/08/21 1121 10/08/21 1628 10/08/21 2124 10/09/21 0741  GLUCAP 122* 90 85 145* 125*   Lipid Profile: No results for input(s): CHOL, HDL, LDLCALC, TRIG, CHOLHDL, LDLDIRECT in the last 72 hours. Thyroid Function  Tests: No results for input(s): TSH, T4TOTAL, FREET4, T3FREE, THYROIDAB in the last 72 hours. Anemia Panel: No results for input(s): VITAMINB12, FOLATE, FERRITIN, TIBC, IRON, RETICCTPCT in the last 72 hours.    Radiology Studies: I have reviewed all of the imaging during this hospital visit personally     Scheduled Meds:  [MAR Hold] dexamethasone (DECADRON) injection  6 mg Intravenous Q6H   [MAR Hold] insulin aspart  0-9 Units Subcutaneous TID WC   [MAR Hold] nicotine  21 mg Transdermal Once   [MAR Hold] pantoprazole  40 mg Oral Daily   Continuous Infusions:  lactated ringers       LOS: 2 days        Tiersa Dayley Gerome Apley, MD

## 2021-10-09 NOTE — Consult Note (Signed)
NAME:  Christopher Randall, MRN:  [id number], DOB:  [birth year], LOS: 2 ADMISSION DATE:  10/06/2021, CONSULTATION DATE: 10/08/2021 REFERRING MD: Dr. Cathlean Sauer, CHIEF COMPLAINT: Right middle lobe mass  BRIEF  60 year old man, history of tobacco use, history of melanoma in remission (2012).  He was admitted with left-sided weakness.  Head CT and then MRI brain confirmed multiple peripherally enhancing intracranial lesions.  Has been treated with corticosteroids and is planning to meet radiation oncology.  CT scan of the chest performed 10/07/2021 showed a mixed density right middle lobe mass, mixed density mediastinal and subcarinal adenopathy, question with some areas of necrosis.  PCCM consulted regarding tissue diagnosis for presumed malignancy.  He denies dyspnea.  He does have some cough that has been ongoing for a few months.  Some intermittent purulent sputum but no hemoptysis.  Denies any chest pain.  He has been working on cutting down his cigarettes.  Has not stopped completely.  Pertinent  Medical History   Past Medical History:  Diagnosis Date   Abnormal TSH 09/28/2017   Anxiety 07/03/2012   Depression    Difficulty sleeping 09/28/2017   Examination of participant in clinical trial 09/11/2012   History of immunotherapy 02/23/2016   Melanoma of skin 10/26/2011   Nicotine dependence 07/17/2015   Smoker      Significant Hospital Events: Including procedures, antibiotic start and stop dates in addition to other pertinent events   CT chest 10/07/2021  > right hilar mass that extends into the right middle lobe, obstructs the posterior segmental airway of the right middle lobe.  There is supraclavicular, mediastinal and right hilar adenopathy.  Multiple groundglass nodular opacities are present, question whether this is metastatic versus another inflammatory process.  Interim History / Subjective:   10/09/2021  = NPO for bronch. No interim complaints. Wife at bedside. Wants to know when XRT  Rx for brain mets will start. FOcal neuro deficits have resolved per wife  Objective   Blood pressure (!) 141/78, pulse 77, temperature 98 F (36.7 C), temperature source Oral, resp. rate 18, height 5\' 9"  (1.753 m), weight 81.2 kg, SpO2 99 %.        Intake/Output Summary (Last 24 hours) at 10/09/2021 0817 Last data filed at 10/09/2021 0600 Gross per 24 hour  Intake 1680 ml  Output --  Net 1680 ml   Filed Weights   10/06/21 1644  Weight: 81.2 kg    Examination:General: No distress. Looks well Neuro: Alert and Oriented x 3. GCS 15. Speech normal Psych: Pleasant Resp:  Barrel Chest - no.  Wheeze - no, Crackles - no, No overt respiratory distress CVS: Normal heart sounds. Murmurs - no Ext: Stigmata of Connective Tissue Disease - no HEENT: Normal upper airway. PEERL +. No post nasal drip       Resolved Hospital Problem list     Assessment & Plan:  Right middle lobe mass extending into the right hilum and impacting the right middle lobe airways, right mid lobe pulmonary artery.  He also has subcarinal and mediastinal adenopathy.  Tissue diagnosis will be important here.  I suspect primary lung cancer, possibly small cell based on central necrosis of his lesions.  This would be an unusual place to see metastatic melanoma.  10/09/2021 - patient ready for bronch. Reviewed and visualized image. NPO confirmed  PLlan  - Risks of pneumothorax, hemothorax, sedation/anesthesia complications such as cardiac or respiratory arrest or hypotension, stroke and bleeding all explained. Benefits of diagnosis but limitations of non-diagnosis also  explained. Patient verbalized understanding and wished to proceed.   - Likely will need EBUS too. Scheduled 10.45am today. D/w my collaeague Dr Ander Slade - has agreed to do EBUS with regular bronch. Patient and wife explained of this change and they are willing to proceed   - For brain mets - have passed info to [facility] Dr Cathlean Sauer to gget XRT MD  involved    SIGNATURE    Dr. Brand Males, M.D., F.C.C.P,  Pulmonary and Critical Care Medicine Staff Physician, [place] Director - Interstitial Lung Disease  Program  [facility] [place] [facility] [place], [place], [address]  NPI Number:  NPI #[id number]  Pager: [phone], If no answer  -> Check AMION or Try [phone] Telephone (clinical office): [phone] Telephone (research): [phone]  8:22 AM 10/09/2021    LABS    PULMONARY No results for input(s): PHART, PCO2ART, PO2ART, HCO3, TCO2, O2SAT in the last 168 hours.  Invalid input(s): PCO2, PO2  CBC Recent Labs  Lab 10/06/21 1812 10/07/21 0425  HGB 12.5* 14.3  HCT 38.0* 42.8  WBC 11.0* 8.4  PLT 458* 456*    COAGULATION No results for input(s): INR in the last 168 hours.  CARDIAC  No results for input(s): TROPONINI in the last 168 hours. No results for input(s): PROBNP in the last 168 hours.   CHEMISTRY Recent Labs  Lab 10/06/21 1812 10/07/21 0425  NA 137 135  K 3.8 3.9  CL 101 101  CO2 27 26  GLUCOSE 87 244*  BUN 12 14  CREATININE 0.46* 0.67  CALCIUM 9.1 8.9   Estimated Creatinine Clearance: 99.4 mL/min (by C-G formula based on SCr of 0.67 mg/dL).   LIVER Recent Labs  Lab 10/06/21 1812  AST 21  ALT 27  ALKPHOS 57  BILITOT 0.2*  PROT 7.0  ALBUMIN 3.6     INFECTIOUS No results for input(s): LATICACIDVEN, PROCALCITON in the last 168 hours.   ENDOCRINE CBG (last 3)  Recent Labs    10/08/21 1628 10/08/21 2124 10/09/21 0741  GLUCAP 85 145* 125*         IMAGING x48h  - image(s) personally visualized  -   highlighted in bold CT CHEST W CONTRAST  Result Date: 10/07/2021 CLINICAL DATA:  Metastatic disease evaluation. EXAM: CT CHEST, ABDOMEN, AND PELVIS WITH CONTRAST TECHNIQUE: Multidetector CT imaging of the chest, abdomen and pelvis was performed following the standard protocol during bolus  administration of intravenous contrast. CONTRAST:  28mL OMNIPAQUE IOHEXOL 350 MG/ML SOLN COMPARISON:  No relevant priors available at time dictation. FINDINGS: CT CHEST FINDINGS Cardiovascular: Aortic and branch vessel atherosclerosis without aneurysmal dilation. No central pulmonary embolus on this nondedicated study. Normal size heart. No significant pericardial effusion/thickening. Mediastinum/Nodes: Supraclavicular adenopathy for instance a 2.2 cm left supraclavicular lymph node on image 4/2. No discrete thyroid nodule. Mediastinal and right hilar adenopathy. For reference 2.2 cm a low right paratracheal lymph node on image 22/2 and a 1.8 cm right hilar lymph node on image 28/2. No pathologically enlarged axillary lymph nodes. Surgical clips in the right axilla. Lungs/Pleura: Right hilar mass which narrows the right middle lobe pulmonary artery and bronchus with obstruction of the lateral segment bronchus of the right middle lobe. The mass is difficult to accurately measure given the associated postobstructive atelectasis, but measures approximately 5.6 x 4.0 cm on image 36/2. There are multiple ground-glass nodular opacities some of which demonstrate a central solid component  for instance a 1.8 cm ground-glass opacity on image 53/4, a 1.1 cm nodular opacity on image 45/4 and a 0.9 cm nodular opacity image 48/4. No suspicious left-sided pulmonary nodules or masses. No pleural effusion. No pneumothorax. Apical predominant paraseptal emphysematous change. Musculoskeletal: No aggressive lytic or blastic lesion bone. Multilevel degenerative changes spine. CT ABDOMEN PELVIS FINDINGS Hepatobiliary: No suspicious hepatic lesion. Gallbladder is unremarkable. No biliary ductal dilation. Pancreas: No pancreatic ductal dilation or evidence of acute inflammation. Spleen: Within normal limits. Adrenals/Urinary Tract: Bilateral adrenal glands are unremarkable. No hydronephrosis. Left cortical renal scarring. No solid  enhancing renal lesion. Mild wall thickening of an incompletely distended urinary bladder. Stomach/Bowel: Stomach is unremarkable for degree of distension. No pathologic dilation of small or large bowel. The appendix and terminal ileum appear normal. Colon is predominately decompressed limiting evaluation. Vascular/Lymphatic: Aortic and branch vessel atherosclerosis without abdominal aortic aneurysm. No pathologically enlarged abdominal or pelvic lymph nodes. Reproductive: Dystrophic prostatic calcifications. Other: No significant abdominopelvic ascites. Musculoskeletal: Multilevel degenerative changes spine. 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 IMPRESSION: 1. Right hilar mass which narrows the right middle lobe pulmonary artery and bronchus with obstruction of the lateral segment bronchus of the right middle lobe. The mass is difficult to accurately measure given the associated postobstructive atelectasis, but measures approximately 5.6 x 4.0 cm. 2. Supraclavicular, mediastinal and right hilar adenopathy. 3. Multiple ground-glass nodule in opacities some of which demonstrate a central solid component. Findings which are nonspecific and may be infectious and inflammatory versus reflecting metastatic disease involvement. Suggest attention on short-term interval follow-up study in 3 months. 4. No evidence of metastatic disease within the abdomen or pelvis. 5. Mild wall thickening of an incompletely distended urinary bladder. Correlate with urinalysis to exclude cystitis. 6. 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 7. Aortic Atherosclerosis (ICD10-I70.0). Electronically Signed   By: Dahlia Bailiff M.D.   On: 10/07/2021 19:07   CT ABDOMEN PELVIS W CONTRAST  Result Date: 10/07/2021 CLINICAL DATA:  Metastatic disease evaluation. EXAM: CT CHEST, ABDOMEN, AND PELVIS WITH CONTRAST TECHNIQUE:  Multidetector CT imaging of the chest, abdomen and pelvis was performed following the standard protocol during bolus administration of intravenous contrast. CONTRAST:  38mL OMNIPAQUE IOHEXOL 350 MG/ML SOLN COMPARISON:  No relevant priors available at time dictation. FINDINGS: CT CHEST FINDINGS Cardiovascular: Aortic and branch vessel atherosclerosis without aneurysmal dilation. No central pulmonary embolus on this nondedicated study. Normal size heart. No significant pericardial effusion/thickening. Mediastinum/Nodes: Supraclavicular adenopathy for instance a 2.2 cm left supraclavicular lymph node on image 4/2. No discrete thyroid nodule. Mediastinal and right hilar adenopathy. For reference 2.2 cm a low right paratracheal lymph node on image 22/2 and a 1.8 cm right hilar lymph node on image 28/2. No pathologically enlarged axillary lymph nodes. Surgical clips in the right axilla. Lungs/Pleura: Right hilar mass which narrows the right middle lobe pulmonary artery and bronchus with obstruction of the lateral segment bronchus of the right middle lobe. The mass is difficult to accurately measure given the associated postobstructive atelectasis, but measures approximately 5.6 x 4.0 cm on image 36/2. There are multiple ground-glass nodular opacities some of which demonstrate a central solid component for instance a 1.8 cm ground-glass opacity on image 53/4, a 1.1 cm nodular opacity on image 45/4 and a 0.9 cm nodular opacity image 48/4. No suspicious left-sided pulmonary nodules or masses. No pleural effusion. No pneumothorax. Apical predominant paraseptal  emphysematous change. Musculoskeletal: No aggressive lytic or blastic lesion bone. Multilevel degenerative changes spine. CT ABDOMEN PELVIS FINDINGS Hepatobiliary: No suspicious hepatic lesion. Gallbladder is unremarkable. No biliary ductal dilation. Pancreas: No pancreatic ductal dilation or evidence of acute inflammation. Spleen: Within normal limits. Adrenals/Urinary  Tract: Bilateral adrenal glands are unremarkable. No hydronephrosis. Left cortical renal scarring. No solid enhancing renal lesion. Mild wall thickening of an incompletely distended urinary bladder. Stomach/Bowel: Stomach is unremarkable for degree of distension. No pathologic dilation of small or large bowel. The appendix and terminal ileum appear normal. Colon is predominately decompressed limiting evaluation. Vascular/Lymphatic: Aortic and branch vessel atherosclerosis without abdominal aortic aneurysm. No pathologically enlarged abdominal or pelvic lymph nodes. Reproductive: Dystrophic prostatic calcifications. Other: No significant abdominopelvic ascites. Musculoskeletal: Multilevel degenerative changes spine. 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 IMPRESSION: 1. Right hilar mass which narrows the right middle lobe pulmonary artery and bronchus with obstruction of the lateral segment bronchus of the right middle lobe. The mass is difficult to accurately measure given the associated postobstructive atelectasis, but measures approximately 5.6 x 4.0 cm. 2. Supraclavicular, mediastinal and right hilar adenopathy. 3. Multiple ground-glass nodule in opacities some of which demonstrate a central solid component. Findings which are nonspecific and may be infectious and inflammatory versus reflecting metastatic disease involvement. Suggest attention on short-term interval follow-up study in 3 months. 4. No evidence of metastatic disease within the abdomen or pelvis. 5. Mild wall thickening of an incompletely distended urinary bladder. Correlate with urinalysis to exclude cystitis. 6. 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 7. Aortic Atherosclerosis (ICD10-I70.0). Electronically Signed   By: Dahlia Bailiff M.D.   On: 10/07/2021 19:07

## 2021-10-09 NOTE — Progress Notes (Signed)
Patient requesting to leave. This nurse informed patient that he was to be discharged in the morning, that he was still here for observation d/t a recent procedure. Patient alert, oriented, and knowledgeable of his condition. Stated that he would like to spend time with his family before treatment next week. AMA papers signed. IV removed.

## 2021-10-09 NOTE — Anesthesia Postprocedure Evaluation (Signed)
Anesthesia Post Note  Patient: Christopher Randall  Procedure(s) Performed: ENDOBRONCHIAL ULTRASOUND VIDEO BRONCHOSCOPY WITHOUT FLUORO FINE NEEDLE ASPIRATION (FNA) LINEAR BRONCHIAL BRUSHINGS BRONCHIAL BIOPSIES     Patient location during evaluation: PACU Anesthesia Type: General Level of consciousness: awake and alert Pain management: pain level controlled Vital Signs Assessment: post-procedure vital signs reviewed and stable Respiratory status: spontaneous breathing, nonlabored ventilation and respiratory function stable Cardiovascular status: blood pressure returned to baseline and stable Postop Assessment: no apparent nausea or vomiting Anesthetic complications: no   No notable events documented.  Last Vitals:  Vitals:   10/09/21 1312 10/09/21 1336  BP: 122/71 136/79  Pulse: 72 68  Resp: 17 16  Temp: 36.6 C   SpO2: 93% 100%    Last Pain:  Vitals:   10/09/21 1338  TempSrc:   PainSc: 2                  Catalina Gravel

## 2021-10-09 NOTE — Anesthesia Preprocedure Evaluation (Signed)
Anesthesia Evaluation  Patient identified by MRN, date of birth, ID band Patient awake    Reviewed: Allergy & Precautions, NPO status , Patient's Chart, lab work & pertinent test results  Airway Mallampati: I  TM Distance: >3 FB Neck ROM: Full    Dental  (+) Teeth Intact, Dental Advisory Given   Pulmonary Current Smoker and Patient abstained from smoking.,  RML mass   Pulmonary exam normal breath sounds clear to auscultation       Cardiovascular negative cardio ROS Normal cardiovascular exam Rhythm:Regular Rate:Normal     Neuro/Psych PSYCHIATRIC DISORDERS Anxiety Depression Brain mets with LLE weakness    GI/Hepatic negative GI ROS, Neg liver ROS,   Endo/Other  negative endocrine ROS  Renal/GU negative Renal ROS     Musculoskeletal negative musculoskeletal ROS (+)   Abdominal   Peds  Hematology negative hematology ROS (+)   Anesthesia Other Findings Day of surgery medications reviewed with the patient.  Reproductive/Obstetrics                             Anesthesia Physical Anesthesia Plan  ASA: 4  Anesthesia Plan: General   Post-op Pain Management:    Induction: Intravenous  PONV Risk Score and Plan: 2 and Dexamethasone and Ondansetron  Airway Management Planned: Oral ETT  Additional Equipment:   Intra-op Plan:   Post-operative Plan: Extubation in OR  Informed Consent: I have reviewed the patients History and Physical, chart, labs and discussed the procedure including the risks, benefits and alternatives for the proposed anesthesia with the patient or authorized representative who has indicated his/her understanding and acceptance.     Dental advisory given  Plan Discussed with: CRNA  Anesthesia Plan Comments:         Anesthesia Quick Evaluation

## 2021-10-09 NOTE — CV Procedure (Signed)
Call from pathology  Samples from all 3 lymph nodes consistent with non-small cell lung cancer

## 2021-10-09 NOTE — Consult Note (Addendum)
[place]  [phone] [phone]   MEDICAL ONCOLOGY - INITIAL CONSULTATION  Referral MD: Dr. Sander Radon  Reason for Referral: Right hilar mass with supraclavicular, mediastinal, and right hilar adenopathy, multiple brain lesions  HPI: Mr. Ewing is a 60 year old male with a past medical history significant for melanoma currently in remission followed at [facility].  He presented to the emergency department with left-sided weakness.  He was noted to have left lower extremity weakness for about 3 to 4 days and then developed left upper extremity weakness the day of admission.  MRI of the brain showed multiple peripherally enhancing intracranial lesions, the largest of which appears to be dural based along the right aspect of the falx with 2 additional peripheral enhancing lesions at the left frontal and parietal lobes which are concerning for metastatic disease.  CT chest/abdomen/pelvis showed a right hilar mass which narrows the right middle lobe pulmonary artery and bronchus with obstruction of the lateral segment bronchus in the right middle lobe, mass difficult to accurately measure given the associated postobstructive atelectasis but measures approximately 5.6 x 4.0 cm, supraclavicular, mediastinal, and right hilar adenopathy, multiple groundglass opacities which are nonspecific and may be infectious and inflammatory versus reflecting metastatic disease involvement, no evidence of metastatic disease in the abdomen/pelvis.  The patient underwent bronchoscopy with EBUS earlier today and note from PCCM indicates that the samples from all 3 lymph nodes are consistent with non-small cell lung cancer.  I met with the patient, his wife, and son in his hospital room.  Since starting on dexamethasone, the patient reports that his left-sided weakness is much improved.  He is eating well.  He is not having any headaches or dizziness at this time.  He has no chest  pain or shortness of breath but does have a mild cough.  He is not having any abdominal pain, nausea, vomiting.  He tells me that he has been followed at [facility] for many years for his history of melanoma.  He received about 6 months of interferon in the past for the melanoma.  Medical Oncology was asked to see the patient to make recommendations regarding his newly diagnosed non-small cell lung cancer.  Past Medical History:  Diagnosis Date   Abnormal TSH 09/28/2017   Anxiety 07/03/2012   Depression    Difficulty sleeping 09/28/2017   Examination of participant in clinical trial 09/11/2012   History of immunotherapy 02/23/2016   Melanoma of skin 10/26/2011   Nicotine dependence 07/17/2015   Smoker   :   Past Surgical History:  Procedure Laterality Date   LEFT POPLITEAL MASS RESECTION  08/27/2011   pigmented villonodular synovitis, negative for malignancy   LYMPH GLAND EXCISION  07/12/2011   right axilla, consistent with melanoma   LYMPH NODE DISSECTION  08/27/2011   right, 0/17 nodes positive  :   Current Facility-Administered Medications  Medication Dose Route Frequency Provider Last Rate Last Admin   acetaminophen (TYLENOL) tablet 650 mg  650 mg Oral Q6H PRN Olalere, Adewale A, MD       Or   acetaminophen (TYLENOL) suppository 650 mg  650 mg Rectal Q6H PRN Olalere, Adewale A, MD       dexamethasone (DECADRON) injection 6 mg  6 mg Intravenous Q6H Olalere, Adewale A, MD   6 mg at 10/09/21 0450   insulin aspart (novoLOG) injection 0-9 Units  0-9 Units Subcutaneous TID WC Olalere, Adewale A, MD   1 Units at 10/08/21 0740   lactated  ringers infusion   Intravenous Continuous Olalere, Adewale A, MD   New Bag at 10/09/21 1130   nicotine (NICODERM CQ - dosed in mg/24 hours) patch 21 mg  21 mg Transdermal Once Olalere, Adewale A, MD       pantoprazole (PROTONIX) EC tablet 40 mg  40 mg Oral Daily Olalere, Adewale A, MD   40 mg at 10/08/21 0102      Allergies  Allergen Reactions    Iopamidol Hives and Itching    Isovue break through with prep, Hives and Itching  Patient had PET?CT scan with 125 mls of Isovue-300. Noted to have two raised itchy hives which self-resolved. Patient had PET/CT scan with contrast on 05/27/2014 while on prednisone prep (3 doses of 50 mg-13 hour prep) and noted to have two hives which self -resolved without treatment   Iodinated Diagnostic Agents Rash   Iodine-131 Rash  :   Family History  Problem Relation Age of Onset   Hypertension Mother    CAD Mother    Lung cancer Father   :   Social History   Socioeconomic History   Marital status: Divorced    Spouse name: Not on file   Number of children: 3   Years of education: Not on file   Highest education level: Not on file  Occupational History    Employer: [employer]  Tobacco Use   Smoking status: Every Day    Packs/day: 0.25    Types: Cigarettes   Smokeless tobacco: Never  Vaping Use   Vaping Use: Never used  Substance and Sexual Activity   Alcohol use: Yes    Alcohol/week: 6.0 standard drinks    Types: 6 Shots of liquor per week   Drug use: No   Sexual activity: Not on file  Other Topics Concern   Not on file  Social History Narrative   Not on file   Social Determinants of Health   Financial Resource Strain: Not on file  Food Insecurity: Not on file  Transportation Needs: Not on file  Physical Activity: Not on file  Stress: Not on file  Social Connections: Not on file  Intimate Partner Violence: Not on file  :  Review of Systems: A comprehensive 14 point review of systems was negative except as noted in the HPI.  Exam: Patient Vitals for the past 24 hrs:  BP Temp Temp src Pulse Resp SpO2 Height Weight  10/09/21 1336 136/79 -- -- 68 16 100 % -- --  10/09/21 1312 122/71 97.8 F (36.6 C) -- 72 17 93 % -- --  10/09/21 1300 125/69 -- -- 75 18 (!) 88 % -- --  10/09/21 1252 118/70 -- -- 80 19 93 % -- --  10/09/21 1247 124/78 -- -- 73 17 95 % -- --   10/09/21 0953 (!) 132/92 97.7 F (36.5 C) Oral 73 18 100 % _0  (1.753 m) 81.2 kg  10/09/21 0451 (!) 141/78 98 F (36.7 C) Oral 77 18 99 % -- --  10/08/21 2120 (!) 158/88 (!) 97.5 F (36.4 C) Oral 71 18 100 % -- --    General:  well-nourished in no acute distress.   Eyes:  no scleral icterus.   ENT:  There were no oropharyngeal lesions.   Lymphatics: Left supraclavicular adenopathy palpated. Respiratory: lungs were clear bilaterally without wheezing or crackles.   Cardiovascular:  Regular rate and rhythm, S1/S2, without murmur, rub or gallop.  There was no pedal edema.   GI:  abdomen  was soft, flat, nontender, nondistended, without organomegaly.   Musculoskeletal: Strength symmetrical in the upper and lower extremities. Skin exam was without echymosis, petichae.   Neuro exam was nonfocal. Patient was alert and oriented.  Attention was good.   Language was appropriate.  Mood was normal without depression.  Speech was not pressured.  Thought content was not tangential.     Lab Results  Component Value Date   WBC 8.4 10/07/2021   HGB 14.3 10/07/2021   HCT 42.8 10/07/2021   PLT 456 (H) 10/07/2021   GLUCOSE 244 (H) 10/07/2021   CHOL 179 01/15/2019   TRIG 122.0 01/15/2019   HDL 49.60 01/15/2019   LDLCALC 105 (H) 01/15/2019   ALT 27 10/06/2021   AST 21 10/06/2021   NA 135 10/07/2021   K 3.9 10/07/2021   CL 101 10/07/2021   CREATININE 0.67 10/07/2021   BUN 14 10/07/2021   CO2 26 10/07/2021    CT Head Wo Contrast  Result Date: 10/06/2021 CLINICAL DATA:  Neuro deficit, acute, stroke suspected. Left sided weakness. History of melanoma. EXAM: CT HEAD WITHOUT CONTRAST TECHNIQUE: Contiguous axial images were obtained from the base of the skull through the vertex without intravenous contrast. COMPARISON:  07/28/2011 head MRI report FINDINGS: Brain: There is extensive vasogenic edema in the right frontal lobe with milder edema anteriorly in the left frontal lobe. There is a suspected 2  cm mass in the parasagittal right frontal lobe. Edema results in mild mass effect on the frontal horn of the right lateral ventricle. No intracranial hemorrhage, definite acute cortically based infarct, significant midline shift, or extra-axial fluid collection is identified. Vascular: Calcified atherosclerosis at the skull base. No hyperdense vessel. Skull: No fracture or suspicious osseous lesion. Sinuses/Orbits: Mild mucosal thickening in the paranasal sinuses. Clear mastoid air cells. Unremarkable orbits. Other: None. IMPRESSION: Vasogenic edema in the frontal lobes, extensive on the right. This is most concerning for metastatic disease, and a brain MRI without and with contrast is recommended for further evaluation. Electronically Signed   By: Logan Bores M.D.   On: 10/06/2021 18:59   CT CHEST W CONTRAST  Result Date: 10/07/2021 CLINICAL DATA:  Metastatic disease evaluation. EXAM: CT CHEST, ABDOMEN, AND PELVIS WITH CONTRAST TECHNIQUE: Multidetector CT imaging of the chest, abdomen and pelvis was performed following the standard protocol during bolus administration of intravenous contrast. CONTRAST:  74m OMNIPAQUE IOHEXOL 350 MG/ML SOLN COMPARISON:  No relevant priors available at time dictation. FINDINGS: CT CHEST FINDINGS Cardiovascular: Aortic and branch vessel atherosclerosis without aneurysmal dilation. No central pulmonary embolus on this nondedicated study. Normal size heart. No significant pericardial effusion/thickening. Mediastinum/Nodes: Supraclavicular adenopathy for instance a 2.2 cm left supraclavicular lymph node on image 4/2. No discrete thyroid nodule. Mediastinal and right hilar adenopathy. For reference 2.2 cm a low right paratracheal lymph node on image 22/2 and a 1.8 cm right hilar lymph node on image 28/2. No pathologically enlarged axillary lymph nodes. Surgical clips in the right axilla. Lungs/Pleura: Right hilar mass which narrows the right middle lobe pulmonary artery and  bronchus with obstruction of the lateral segment bronchus of the right middle lobe. The mass is difficult to accurately measure given the associated postobstructive atelectasis, but measures approximately 5.6 x 4.0 cm on image 36/2. There are multiple ground-glass nodular opacities some of which demonstrate a central solid component for instance a 1.8 cm ground-glass opacity on image 53/4, a 1.1 cm nodular opacity on image 45/4 and a 0.9 cm nodular opacity image 48/4. No suspicious  left-sided pulmonary nodules or masses. No pleural effusion. No pneumothorax. Apical predominant paraseptal emphysematous change. Musculoskeletal: No aggressive lytic or blastic lesion bone. Multilevel degenerative changes spine. CT ABDOMEN PELVIS FINDINGS Hepatobiliary: No suspicious hepatic lesion. Gallbladder is unremarkable. No biliary ductal dilation. Pancreas: No pancreatic ductal dilation or evidence of acute inflammation. Spleen: Within normal limits. Adrenals/Urinary Tract: Bilateral adrenal glands are unremarkable. No hydronephrosis. Left cortical renal scarring. No solid enhancing renal lesion. Mild wall thickening of an incompletely distended urinary bladder. Stomach/Bowel: Stomach is unremarkable for degree of distension. No pathologic dilation of small or large bowel. The appendix and terminal ileum appear normal. Colon is predominately decompressed limiting evaluation. Vascular/Lymphatic: Aortic and branch vessel atherosclerosis without abdominal aortic aneurysm. No pathologically enlarged abdominal or pelvic lymph nodes. Reproductive: Dystrophic prostatic calcifications. Other: No significant abdominopelvic ascites. Musculoskeletal: Multilevel degenerative changes spine. 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 IMPRESSION: 1. Right hilar mass which narrows the right middle lobe pulmonary artery and bronchus with obstruction of the lateral segment bronchus of the  right middle lobe. The mass is difficult to accurately measure given the associated postobstructive atelectasis, but measures approximately 5.6 x 4.0 cm. 2. Supraclavicular, mediastinal and right hilar adenopathy. 3. Multiple ground-glass nodule in opacities some of which demonstrate a central solid component. Findings which are nonspecific and may be infectious and inflammatory versus reflecting metastatic disease involvement. Suggest attention on short-term interval follow-up study in 3 months. 4. No evidence of metastatic disease within the abdomen or pelvis. 5. Mild wall thickening of an incompletely distended urinary bladder. Correlate with urinalysis to exclude cystitis. 6. 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 7. Aortic Atherosclerosis (ICD10-I70.0). Electronically Signed   By: Dahlia Bailiff M.D.   On: 10/07/2021 19:07   MR BRAIN W WO CONTRAST  Result Date: 10/06/2021 CLINICAL DATA:  Brain mass or lesion EXAM: MRI HEAD WITHOUT AND WITH CONTRAST TECHNIQUE: Multiplanar, multiecho pulse sequences of the brain and surrounding structures were obtained without and with intravenous contrast. CONTRAST:  8.31m GADAVIST GADOBUTROL 1 MMOL/ML IV SOLN COMPARISON:  No prior MRI, correlation is made with same day CT brain FINDINGS: Brain: Multiple peripherally enhancing intracranial lesions, the largest of which is a 2.2 x 1.5 x 2.3 cm (AP x TR x CC) mass along the right frontal falx and is favored to extra-axial (series 16, image 138 and series 19, image 16). Significant surrounding T2 hyperintense signal, likely edema, which extends into the anterior and posterior right frontal lobe. Additional peripherally enhancing intraparenchymal lesions are also noted in the anterior left frontal lobe and posterior left parietal lobe. The left frontal lobe lesion measures 1.1 x 0.7 x 0.9 cm (series 16, image 121 and series 19, image 10). The left parietal lesion  measures 0.8 x 0.7 x 0.6 cm (series 16, image 124 and series 19, image 26). There is significant edema associated with the left frontal but not the left parietal lesion. No acute infarct, hemorrhage, hydrocephalus, extra-axial collection, or midline shift. Vascular: Normal flow voids. Skull and upper cervical spine: Intrinsically T1 hyperintense lesion in the clivus (series 16, image 31), indeterminate for enhancement. Otherwise normal marrow signal. Sinuses/Orbits: Mucosal thickening in the ethmoid air cells. Otherwise negative. Other: The mastoids are well aerated. IMPRESSION: Multiple peripherally enhancing intracranial lesions, the largest of which appears to be dural based, along the right aspect of the falx, with 2 additional peripherally enhancing lesions in  the left frontal and parietal lobes. These are overall concerning for metastatic disease. Electronically Signed   By: Merilyn Baba M.D.   On: 10/06/2021 23:07   CT ABDOMEN PELVIS W CONTRAST  Result Date: 10/07/2021 CLINICAL DATA:  Metastatic disease evaluation. EXAM: CT CHEST, ABDOMEN, AND PELVIS WITH CONTRAST TECHNIQUE: Multidetector CT imaging of the chest, abdomen and pelvis was performed following the standard protocol during bolus administration of intravenous contrast. CONTRAST:  36mL OMNIPAQUE IOHEXOL 350 MG/ML SOLN COMPARISON:  No relevant priors available at time dictation. FINDINGS: CT CHEST FINDINGS Cardiovascular: Aortic and branch vessel atherosclerosis without aneurysmal dilation. No central pulmonary embolus on this nondedicated study. Normal size heart. No significant pericardial effusion/thickening. Mediastinum/Nodes: Supraclavicular adenopathy for instance a 2.2 cm left supraclavicular lymph node on image 4/2. No discrete thyroid nodule. Mediastinal and right hilar adenopathy. For reference 2.2 cm a low right paratracheal lymph node on image 22/2 and a 1.8 cm right hilar lymph node on image 28/2. No pathologically enlarged axillary  lymph nodes. Surgical clips in the right axilla. Lungs/Pleura: Right hilar mass which narrows the right middle lobe pulmonary artery and bronchus with obstruction of the lateral segment bronchus of the right middle lobe. The mass is difficult to accurately measure given the associated postobstructive atelectasis, but measures approximately 5.6 x 4.0 cm on image 36/2. There are multiple ground-glass nodular opacities some of which demonstrate a central solid component for instance a 1.8 cm ground-glass opacity on image 53/4, a 1.1 cm nodular opacity on image 45/4 and a 0.9 cm nodular opacity image 48/4. No suspicious left-sided pulmonary nodules or masses. No pleural effusion. No pneumothorax. Apical predominant paraseptal emphysematous change. Musculoskeletal: No aggressive lytic or blastic lesion bone. Multilevel degenerative changes spine. CT ABDOMEN PELVIS FINDINGS Hepatobiliary: No suspicious hepatic lesion. Gallbladder is unremarkable. No biliary ductal dilation. Pancreas: No pancreatic ductal dilation or evidence of acute inflammation. Spleen: Within normal limits. Adrenals/Urinary Tract: Bilateral adrenal glands are unremarkable. No hydronephrosis. Left cortical renal scarring. No solid enhancing renal lesion. Mild wall thickening of an incompletely distended urinary bladder. Stomach/Bowel: Stomach is unremarkable for degree of distension. No pathologic dilation of small or large bowel. The appendix and terminal ileum appear normal. Colon is predominately decompressed limiting evaluation. Vascular/Lymphatic: Aortic and branch vessel atherosclerosis without abdominal aortic aneurysm. No pathologically enlarged abdominal or pelvic lymph nodes. Reproductive: Dystrophic prostatic calcifications. Other: No significant abdominopelvic ascites. Musculoskeletal: Multilevel degenerative changes spine. 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 IMPRESSION: 1.  Right hilar mass which narrows the right middle lobe pulmonary artery and bronchus with obstruction of the lateral segment bronchus of the right middle lobe. The mass is difficult to accurately measure given the associated postobstructive atelectasis, but measures approximately 5.6 x 4.0 cm. 2. Supraclavicular, mediastinal and right hilar adenopathy. 3. Multiple ground-glass nodule in opacities some of which demonstrate a central solid component. Findings which are nonspecific and may be infectious and inflammatory versus reflecting metastatic disease involvement. Suggest attention on short-term interval follow-up study in 3 months. 4. No evidence of metastatic disease within the abdomen or pelvis. 5. Mild wall thickening of an incompletely distended urinary bladder. Correlate with urinalysis to exclude cystitis. 6. 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 7. Aortic Atherosclerosis (ICD10-I70.0). Electronically Signed   By: Dahlia Bailiff M.D.   On: 10/07/2021 19:07   US Venous Img Lower Unilateral Left  Result Date: 10/06/2021 CLINICAL DATA:  Pain. EXAM: Left LOWER EXTREMITY VENOUS DOPPLER ULTRASOUND TECHNIQUE: Gray-scale sonography with compression, as well as color and duplex ultrasound, were performed to evaluate the deep venous system(s) from the level of the common femoral vein through the popliteal and proximal calf veins. COMPARISON:  None. FINDINGS: VENOUS Normal compressibility of the common femoral, superficial femoral, and popliteal veins, as well as the visualized calf veins. Visualized portions of profunda femoral vein and great saphenous vein unremarkable. No filling defects to suggest DVT on grayscale or color Doppler imaging. Doppler waveforms show normal direction of venous flow, normal respiratory plasticity and response to augmentation. Limited views of the contralateral common femoral vein are unremarkable. OTHER Slow flow noted  within the left popliteal vein of uncertain etiology. Limitations: none IMPRESSION: No evidence for deep venous thrombosis in the left lower extremity. Electronically Signed   By: Ronney Asters M.D.   On: 10/06/2021 20:09   DG Chest Portable 1 View  Result Date: 10/06/2021 CLINICAL DATA:  Pain EXAM: PORTABLE CHEST 1 VIEW COMPARISON:  08/11/2021 FINDINGS: Persistent right middle lobe opacity no pleural effusion. Stable cardiomediastinal silhouette. IMPRESSION: Persistent right middle lobe opacity, given lack of interval clearing, recommend CT chest for further evaluation. Electronically Signed   By: Donavan Foil M.D.   On: 10/06/2021 19:08     CT Head Wo Contrast  Result Date: 10/06/2021 CLINICAL DATA:  Neuro deficit, acute, stroke suspected. Left sided weakness. History of melanoma. EXAM: CT HEAD WITHOUT CONTRAST TECHNIQUE: Contiguous axial images were obtained from the base of the skull through the vertex without intravenous contrast. COMPARISON:  07/28/2011 head MRI report FINDINGS: Brain: There is extensive vasogenic edema in the right frontal lobe with milder edema anteriorly in the left frontal lobe. There is a suspected 2 cm mass in the parasagittal right frontal lobe. Edema results in mild mass effect on the frontal horn of the right lateral ventricle. No intracranial hemorrhage, definite acute cortically based infarct, significant midline shift, or extra-axial fluid collection is identified. Vascular: Calcified atherosclerosis at the skull base. No hyperdense vessel. Skull: No fracture or suspicious osseous lesion. Sinuses/Orbits: Mild mucosal thickening in the paranasal sinuses. Clear mastoid air cells. Unremarkable orbits. Other: None. IMPRESSION: Vasogenic edema in the frontal lobes, extensive on the right. This is most concerning for metastatic disease, and a brain MRI without and with contrast is recommended for further evaluation. Electronically Signed   By: Logan Bores M.D.   On:  10/06/2021 18:59   CT CHEST W CONTRAST  Result Date: 10/07/2021 CLINICAL DATA:  Metastatic disease evaluation. EXAM: CT CHEST, ABDOMEN, AND PELVIS WITH CONTRAST TECHNIQUE: Multidetector CT imaging of the chest, abdomen and pelvis was performed following the standard protocol during bolus administration of intravenous contrast. CONTRAST:  48mL OMNIPAQUE IOHEXOL 350 MG/ML SOLN COMPARISON:  No relevant priors available at time dictation. FINDINGS: CT CHEST FINDINGS Cardiovascular: Aortic and branch vessel atherosclerosis without aneurysmal dilation. No central pulmonary embolus on this nondedicated study. Normal size heart. No significant pericardial effusion/thickening. Mediastinum/Nodes: Supraclavicular adenopathy for instance a 2.2 cm left supraclavicular lymph node on image 4/2. No discrete thyroid nodule. Mediastinal and right hilar adenopathy. For reference 2.2 cm a low right paratracheal lymph node on image 22/2 and a 1.8 cm right hilar lymph node on image 28/2. No pathologically enlarged axillary lymph nodes. Surgical clips in the right axilla. Lungs/Pleura: Right hilar mass which narrows the right middle lobe pulmonary artery and bronchus with obstruction of the lateral segment bronchus of the right middle lobe. The mass  is difficult to accurately measure given the associated postobstructive atelectasis, but measures approximately 5.6 x 4.0 cm on image 36/2. There are multiple ground-glass nodular opacities some of which demonstrate a central solid component for instance a 1.8 cm ground-glass opacity on image 53/4, a 1.1 cm nodular opacity on image 45/4 and a 0.9 cm nodular opacity image 48/4. No suspicious left-sided pulmonary nodules or masses. No pleural effusion. No pneumothorax. Apical predominant paraseptal emphysematous change. Musculoskeletal: No aggressive lytic or blastic lesion bone. Multilevel degenerative changes spine. CT ABDOMEN PELVIS FINDINGS Hepatobiliary: No suspicious hepatic lesion.  Gallbladder is unremarkable. No biliary ductal dilation. Pancreas: No pancreatic ductal dilation or evidence of acute inflammation. Spleen: Within normal limits. Adrenals/Urinary Tract: Bilateral adrenal glands are unremarkable. No hydronephrosis. Left cortical renal scarring. No solid enhancing renal lesion. Mild wall thickening of an incompletely distended urinary bladder. Stomach/Bowel: Stomach is unremarkable for degree of distension. No pathologic dilation of small or large bowel. The appendix and terminal ileum appear normal. Colon is predominately decompressed limiting evaluation. Vascular/Lymphatic: Aortic and branch vessel atherosclerosis without abdominal aortic aneurysm. No pathologically enlarged abdominal or pelvic lymph nodes. Reproductive: Dystrophic prostatic calcifications. Other: No significant abdominopelvic ascites. Musculoskeletal: Multilevel degenerative changes spine. 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 IMPRESSION: 1. Right hilar mass which narrows the right middle lobe pulmonary artery and bronchus with obstruction of the lateral segment bronchus of the right middle lobe. The mass is difficult to accurately measure given the associated postobstructive atelectasis, but measures approximately 5.6 x 4.0 cm. 2. Supraclavicular, mediastinal and right hilar adenopathy. 3. Multiple ground-glass nodule in opacities some of which demonstrate a central solid component. Findings which are nonspecific and may be infectious and inflammatory versus reflecting metastatic disease involvement. Suggest attention on short-term interval follow-up study in 3 months. 4. No evidence of metastatic disease within the abdomen or pelvis. 5. Mild wall thickening of an incompletely distended urinary bladder. Correlate with urinalysis to exclude cystitis. 6. 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 7. Aortic Atherosclerosis (ICD10-I70.0). Electronically Signed   By: Dahlia Bailiff M.D.   On: 10/07/2021 19:07   MR BRAIN W WO CONTRAST  Result Date: 10/06/2021 CLINICAL DATA:  Brain mass or lesion EXAM: MRI HEAD WITHOUT AND WITH CONTRAST TECHNIQUE: Multiplanar, multiecho pulse sequences of the brain and surrounding structures were obtained without and with intravenous contrast. CONTRAST:  8.78m GADAVIST GADOBUTROL 1 MMOL/ML IV SOLN COMPARISON:  No prior MRI, correlation is made with same day CT brain FINDINGS: Brain: Multiple peripherally enhancing intracranial lesions, the largest of which is a 2.2 x 1.5 x 2.3 cm (AP x TR x CC) mass along the right frontal falx and is favored to extra-axial (series 16, image 138 and series 19, image 16). Significant surrounding T2 hyperintense signal, likely edema, which extends into the anterior and posterior right frontal lobe. Additional peripherally enhancing intraparenchymal lesions are also noted in the anterior left frontal lobe and posterior left parietal lobe. The left frontal lobe lesion measures 1.1 x 0.7 x 0.9 cm (series 16, image 121 and series 19, image 10). The left parietal lesion measures 0.8 x 0.7 x 0.6 cm (series 16, image 124 and series 19, image 26). There is significant edema associated with the left frontal but not the left parietal lesion. No acute infarct, hemorrhage, hydrocephalus, extra-axial collection, or midline shift. Vascular: Normal flow voids. Skull and upper cervical spine: Intrinsically  T1 hyperintense lesion in the clivus (series 16, image 31), indeterminate for enhancement. Otherwise normal marrow signal. Sinuses/Orbits: Mucosal thickening in the ethmoid air cells. Otherwise negative. Other: The mastoids are well aerated. IMPRESSION: Multiple peripherally enhancing intracranial lesions, the largest of which appears to be dural based, along the right aspect of the falx, with 2 additional peripherally enhancing lesions in the left  frontal and parietal lobes. These are overall concerning for metastatic disease. Electronically Signed   By: Merilyn Baba M.D.   On: 10/06/2021 23:07   CT ABDOMEN PELVIS W CONTRAST  Result Date: 10/07/2021 CLINICAL DATA:  Metastatic disease evaluation. EXAM: CT CHEST, ABDOMEN, AND PELVIS WITH CONTRAST TECHNIQUE: Multidetector CT imaging of the chest, abdomen and pelvis was performed following the standard protocol during bolus administration of intravenous contrast. CONTRAST:  17m OMNIPAQUE IOHEXOL 350 MG/ML SOLN COMPARISON:  No relevant priors available at time dictation. FINDINGS: CT CHEST FINDINGS Cardiovascular: Aortic and branch vessel atherosclerosis without aneurysmal dilation. No central pulmonary embolus on this nondedicated study. Normal size heart. No significant pericardial effusion/thickening. Mediastinum/Nodes: Supraclavicular adenopathy for instance a 2.2 cm left supraclavicular lymph node on image 4/2. No discrete thyroid nodule. Mediastinal and right hilar adenopathy. For reference 2.2 cm a low right paratracheal lymph node on image 22/2 and a 1.8 cm right hilar lymph node on image 28/2. No pathologically enlarged axillary lymph nodes. Surgical clips in the right axilla. Lungs/Pleura: Right hilar mass which narrows the right middle lobe pulmonary artery and bronchus with obstruction of the lateral segment bronchus of the right middle lobe. The mass is difficult to accurately measure given the associated postobstructive atelectasis, but measures approximately 5.6 x 4.0 cm on image 36/2. There are multiple ground-glass nodular opacities some of which demonstrate a central solid component for instance a 1.8 cm ground-glass opacity on image 53/4, a 1.1 cm nodular opacity on image 45/4 and a 0.9 cm nodular opacity image 48/4. No suspicious left-sided pulmonary nodules or masses. No pleural effusion. No pneumothorax. Apical predominant paraseptal emphysematous change. Musculoskeletal: No aggressive  lytic or blastic lesion bone. Multilevel degenerative changes spine. CT ABDOMEN PELVIS FINDINGS Hepatobiliary: No suspicious hepatic lesion. Gallbladder is unremarkable. No biliary ductal dilation. Pancreas: No pancreatic ductal dilation or evidence of acute inflammation. Spleen: Within normal limits. Adrenals/Urinary Tract: Bilateral adrenal glands are unremarkable. No hydronephrosis. Left cortical renal scarring. No solid enhancing renal lesion. Mild wall thickening of an incompletely distended urinary bladder. Stomach/Bowel: Stomach is unremarkable for degree of distension. No pathologic dilation of small or large bowel. The appendix and terminal ileum appear normal. Colon is predominately decompressed limiting evaluation. Vascular/Lymphatic: Aortic and branch vessel atherosclerosis without abdominal aortic aneurysm. No pathologically enlarged abdominal or pelvic lymph nodes. Reproductive: Dystrophic prostatic calcifications. Other: No significant abdominopelvic ascites. Musculoskeletal: Multilevel degenerative changes spine. 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 IMPRESSION: 1. Right hilar mass which narrows the right middle lobe pulmonary artery and bronchus with obstruction of the lateral segment bronchus of the right middle lobe. The mass is difficult to accurately measure given the associated postobstructive atelectasis, but measures approximately 5.6 x 4.0 cm. 2. Supraclavicular, mediastinal and right hilar adenopathy. 3. Multiple ground-glass nodule in opacities some of which demonstrate a central solid component. Findings which are nonspecific and may be infectious and inflammatory versus reflecting metastatic disease involvement. Suggest attention on short-term interval follow-up study in 3 months. 4. No evidence of metastatic disease within the abdomen or pelvis. 5. Mild wall  thickening of an incompletely distended urinary bladder. Correlate  with urinalysis to exclude cystitis. 6. 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 7. Aortic Atherosclerosis (ICD10-I70.0). Electronically Signed   By: Dahlia Bailiff M.D.   On: 10/07/2021 19:07   US Venous Img Lower Unilateral Left  Result Date: 10/06/2021 CLINICAL DATA:  Pain. EXAM: Left LOWER EXTREMITY VENOUS DOPPLER ULTRASOUND TECHNIQUE: Gray-scale sonography with compression, as well as color and duplex ultrasound, were performed to evaluate the deep venous system(s) from the level of the common femoral vein through the popliteal and proximal calf veins. COMPARISON:  None. FINDINGS: VENOUS Normal compressibility of the common femoral, superficial femoral, and popliteal veins, as well as the visualized calf veins. Visualized portions of profunda femoral vein and great saphenous vein unremarkable. No filling defects to suggest DVT on grayscale or color Doppler imaging. Doppler waveforms show normal direction of venous flow, normal respiratory plasticity and response to augmentation. Limited views of the contralateral common femoral vein are unremarkable. OTHER Slow flow noted within the left popliteal vein of uncertain etiology. Limitations: none IMPRESSION: No evidence for deep venous thrombosis in the left lower extremity. Electronically Signed   By: Ronney Asters M.D.   On: 10/06/2021 20:09   DG Chest Portable 1 View  Result Date: 10/06/2021 CLINICAL DATA:  Pain EXAM: PORTABLE CHEST 1 VIEW COMPARISON:  08/11/2021 FINDINGS: Persistent right middle lobe opacity no pleural effusion. Stable cardiomediastinal silhouette. IMPRESSION: Persistent right middle lobe opacity, given lack of interval clearing, recommend CT chest for further evaluation. Electronically Signed   By: Donavan Foil M.D.   On: 10/06/2021 19:08    Assessment and Plan:  This is a 60 year old male with  1) stage IV non-small cell lung cancer -10/06/2021 the MRI of the brain  with and without contrast- "Multiple peripherally enhancing intracranial lesions, the largest of which appears to be dural based, along the right aspect of the falx, with 2 additional peripherally enhancing lesions in the left frontal and parietal lobes. These are overall concerning for metastatic disease." -10/07/2021 CT chest/abdomen/pelvis with contrast- "1. Right hilar mass which narrows the right middle lobe pulmonary artery and bronchus with obstruction of the lateral segment bronchus of the right middle lobe. The mass is difficult to accurately measure given the associated postobstructive atelectasis, but measures approximately 5.6 x 4.0 cm. 2. Supraclavicular, mediastinal and right hilar adenopathy. 3. Multiple ground-glass nodule in opacities some of which demonstrate a central solid component. Findings which are nonspecific and may be infectious and inflammatory versus reflecting metastatic disease involvement. Suggest attention on short-term interval follow-up study in 3 months. 4. No evidence of metastatic disease within the abdomen or pelvis. 5. Mild wall thickening of an incompletely distended urinary bladder. Correlate with urinalysis to exclude cystitis. 6. 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 7. Aortic Atherosclerosis (ICD10-I70.0)."  2) history of melanoma, stage III -Diagnosed June 2012 -Right axillary excisional biopsy in July 2012 consistent with melanoma -Status post right axillary lymph node dissection, 0/17 nodes negative for malignancy -Received high-dose interferon in the ECOG 1609 clinical trial on 10/18/2011 -No evidence of recurrent or metastatic disease on imaging per notes from [facility] with last scan performed on 10/23/2018  PLAN: -Discussed CT scan results, MRI of the brain results, as well as preliminary biopsy results with the patient and his family.  Preliminary biopsy results consistent with non-small cell  lung cancer.  IHC pending. -  The patient has already met with radiation oncology who plan to follow-up next week for radiation planning.  Sounds like he may receive SRS to his brain lesions.  Recommend for him to continue on dexamethasone with further dose adjustments per radiation oncology. -From medical oncology standpoint, we discussed that we need to wait IHC testing and then will likely send pathology for additional studies including PD-L1 and foundation 1 depending on these results. -We broadly discussed treatment options which may include targeted therapy versus chemotherapy +/- immunotherapy, but I explained that a more definitive treatment plan would be discussed once we have more results from pathology. -We can arrange for outpatient follow-up in our clinic once he is discharged.  Thank you for this referral.   Mikey Bussing, DNP, AGPCNP-BC, AOCNP    ADDENDUM  .Patient was Personally and independently interviewed, examined and relevant elements of the history of present illness were reviewed in details and an assessment and plan was created. All elements of the patient's history of present illness , assessment and plan were discussed in details with Mikey Bussing, DNP, AGPCNP-BC, AOCNP . The above documentation reflects our combined findings assessment and plan.   Awaiting final pathology.  Will call pathology to order PDL1 and foundation one testing assuming this is lung NSCLC. Radiation oncology has been consulted and shall be arranging for neuronavigational MRI and consideration of palliative RT to brain lesions.  Will f/u with patient to discuss systemic treatment options as out patient. No additional inpatient medical oncology plan at this time. Patient notes significant improvement in neurodeficits with Dexamethasone -- this typically suggests higher likelihood of benefit from palliative RT for brain lesions. Will defer mx of steroids to radiation oncology based on RT  plan.  Sullivan Lone MD MS

## 2021-10-09 NOTE — Transfer of Care (Signed)
Immediate Anesthesia Transfer of Care Note  Patient: Christopher Randall  Procedure(s) Performed: ENDOBRONCHIAL ULTRASOUND VIDEO BRONCHOSCOPY WITHOUT FLUORO FINE NEEDLE ASPIRATION (FNA) LINEAR BRONCHIAL BRUSHINGS BRONCHIAL BIOPSIES  Patient Location: PACU  Anesthesia Type:General  Level of Consciousness: awake, alert  and patient cooperative  Airway & Oxygen Therapy: Patient Spontanous Breathing and Patient connected to face mask oxygen  Post-op Assessment: Report given to RN and Post -op Vital signs reviewed and stable  Post vital signs: Reviewed and stable  Last Vitals:  Vitals Value Taken Time  BP 124/78 10/09/21 1247  Temp    Pulse 79 10/09/21 1250  Resp 15 10/09/21 1250  SpO2 95 % 10/09/21 1250  Vitals shown include unvalidated device data.  Last Pain:  Vitals:   10/09/21 0953  TempSrc: Oral  PainSc: 0-No pain         Complications: No notable events documented.

## 2021-10-10 NOTE — Discharge Summary (Addendum)
Physician Discharge Summary  Christopher Randall VBT:[id number] DOB: [birth year] DOA: 10/06/2021  PCP: Pcp, No  Admit date: 10/06/2021 Discharge date: 10/10/2021  Admitted From: Home  Disposition:  Home   Recommendations for Outpatient Follow-up and new medication changes:  [place].   I called his pharmacy dexamethasone Pharmacy not taking over the phone prescription for glucometer.   Brief/Interim Summary: Christopher Randall was admitted to the hospital with the working diagnosis of new brain metastasis. 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    60 year old male past medical history for melanoma on remission, treated in 2012 who presents with left-sided weakness.  He reported 3-4 days of left lower extremity weakness, that progressed into left upper extremity weakness within 24 hours of him coming to the hospital.  Because of worsening neurologic symptoms he came for further evaluation to the emergency room.  On his initial physical examination his blood pressure 141/88, heart rate 82, respiratory rate 17, temperature 98.5, oxygen saturation 99%, his lungs were clear to auscultation bilaterally, heart S1-S2, present, rhythmic, abdomen soft nontender, no lower extremity edema, 4-5 strength left upper and lower extremity.   Sodium 137, potassium 3.8, chloride 101, bicarb 27, glucose 87, BUN 12, creatinine 0.46, white count 11.0, hemoglobin 12.5, hematocrit 38.0, platelets 458. SARS COVID-19 negative.   Head CT with vasogenic edema in the frontal lobes, extensive on the right.  Concerning for metastatic disease. Brain MRI with multiple peripherally enhancing intracranial lesions, largest of which appears to be posterior frontal dural based 2 cm on the right side with surrounding edema.  2 additional peripheral enhancing lesions in the left frontal and parietal lobes.   Chest radiograph with a persona right lobe well delineated opacity, clear  margins.   EKG 83 bpm, normal axis, normal intervals, sinus rhythm, no significant ST segment T wave changes.   Patient was placed on systemic corticosteroids.  Neurosurgery was consulted, recommendations to proceed with radiation therapy.   Chest ct with right hilar mass which narrows right middle artery amd bronchus with post obstructive atelectasis.   Patient underwent bronchoscopy finding narrowing of the right middle lobe lateral segment.  Preliminary pathology non small cell lung cancer.  Oncology was consulted.  Patient's neurologic symptoms improved with systemic steroids.  Patient left the hospital against medical advice on 10/09/21.  Metastatic non-small cell lung cancer, brain metastasis.  Patient was evaluated by neurosurgery, pulmonary and oncology. Preliminary pathology from right middle lobe bronchus is positive for non-small cell lung cancer.  Patient will follow-up with radiation oncology for his brain metastases. Unfortunately he left the hospital [facility]. I called his wife over the phone and will send a prescription for dexamethasone, instructions to follow-up as outpatient with his primary care provider.  2.  Steroid-induced hyperglycemia.  Patient developed hyperglycemia related to steroids.  He was on 6 mg of dexamethasone every 6 hours. His capillary glucose 152 at 16:35 hrs. She will continue steroids with dexamethasone 4 mg every 6 hours. Follow capillary glucose at home.  3.  History of melanoma.  Has been in remission.  4.  Tobacco abuse.  Smoking cessation counseling.  Discharge Diagnoses:  Principal Problem:   Brain metastasis [name]) Active Problems:   Left-sided weakness   Brain metastases (HCC)   Mass of middle lobe of right lung   Lung cancer metastatic to brain [facility])    Discharge Instructions   Allergies as of 10/09/2021       Reactions  Iopamidol Hives, Itching   Isovue break through with prep, Hives and  Itching Patient had PET?CT scan with 125 mls of Isovue-300. Noted to have two raised itchy hives which self-resolved. Patient had PET/CT scan with contrast on 05/27/2014 while on prednisone prep (3 doses of 50 mg-13 hour prep) and noted to have two hives which self -resolved without treatment   Iodinated Diagnostic Agents Rash   Iodine-131 Rash        Medication List     ASK your doctor about these medications    amoxicillin-clavulanate 875-125 MG tablet Commonly known as: AUGMENTIN Take 1 tablet by mouth 2 (two) times daily.   azithromycin 250 MG tablet Commonly known as: Zithromax Z-Pak Take two tablets on day one, and then one tablet daily the next four days.   Diclofenac Sodium 1.5 % Soln Place one application on to the skin 2x daily.   ICAPS AREDS 2 PO Take 1 capsule by mouth daily.   LORazepam 1 MG tablet Commonly known as: ATIVAN Take 1 tablet (1 mg total) by mouth 2 (two) times daily as needed for anxiety.   nicotine polacrilex 4 MG lozenge Commonly known as: COMMIT Take 4 mg by mouth 2 (two) times daily as needed for smoking cessation.   OVER THE COUNTER MEDICATION Natural Calm Magnesium Powder: Dissolve 1 teaspoon into 8 oz beverage and drink daily        Allergies  Allergen Reactions   Iopamidol Hives and Itching    Isovue break through with prep, Hives and Itching  Patient had PET?CT scan with 125 mls of Isovue-300. Noted to have two raised itchy hives which self-resolved. Patient had PET/CT scan with contrast on 05/27/2014 while on prednisone prep (3 doses of 50 mg-13 hour prep) and noted to have two hives which self -resolved without treatment   Iodinated Diagnostic Agents Rash   Iodine-131 Rash    Consultations: Pulmonary Neurosurgery Radiation oncology Oncology     Procedures/Studies: CT Head Wo Contrast  Result Date: 10/06/2021 CLINICAL DATA:  Neuro deficit, acute, stroke suspected. Left sided weakness. History of melanoma. EXAM: CT  HEAD WITHOUT CONTRAST TECHNIQUE: Contiguous axial images were obtained from the base of the skull through the vertex without intravenous contrast. COMPARISON:  07/28/2011 head MRI report FINDINGS: Brain: There is extensive vasogenic edema in the right frontal lobe with milder edema anteriorly in the left frontal lobe. There is a suspected 2 cm mass in the parasagittal right frontal lobe. Edema results in mild mass effect on the frontal horn of the right lateral ventricle. No intracranial hemorrhage, definite acute cortically based infarct, significant midline shift, or extra-axial fluid collection is identified. Vascular: Calcified atherosclerosis at the skull base. No hyperdense vessel. Skull: No fracture or suspicious osseous lesion. Sinuses/Orbits: Mild mucosal thickening in the paranasal sinuses. Clear mastoid air cells. Unremarkable orbits. Other: None. IMPRESSION: Vasogenic edema in the frontal lobes, extensive on the right. This is most concerning for metastatic disease, and a brain MRI without and with contrast is recommended for further evaluation. Electronically Signed   By: Logan Bores M.D.   On: 10/06/2021 18:59   CT CHEST W CONTRAST  Result Date: 10/07/2021 CLINICAL DATA:  Metastatic disease evaluation. EXAM: CT CHEST, ABDOMEN, AND PELVIS WITH CONTRAST TECHNIQUE: Multidetector CT imaging of the chest, abdomen and pelvis was performed following the standard protocol during bolus administration of intravenous contrast. CONTRAST:  52m OMNIPAQUE IOHEXOL 350 MG/ML SOLN COMPARISON:  No relevant priors available at time dictation. FINDINGS: CT CHEST FINDINGS  Cardiovascular: Aortic and branch vessel atherosclerosis without aneurysmal dilation. No central pulmonary embolus on this nondedicated study. Normal size heart. No significant pericardial effusion/thickening. Mediastinum/Nodes: Supraclavicular adenopathy for instance a 2.2 cm left supraclavicular lymph node on image 4/2. No discrete thyroid nodule.  Mediastinal and right hilar adenopathy. For reference 2.2 cm a low right paratracheal lymph node on image 22/2 and a 1.8 cm right hilar lymph node on image 28/2. No pathologically enlarged axillary lymph nodes. Surgical clips in the right axilla. Lungs/Pleura: Right hilar mass which narrows the right middle lobe pulmonary artery and bronchus with obstruction of the lateral segment bronchus of the right middle lobe. The mass is difficult to accurately measure given the associated postobstructive atelectasis, but measures approximately 5.6 x 4.0 cm on image 36/2. There are multiple ground-glass nodular opacities some of which demonstrate a central solid component for instance a 1.8 cm ground-glass opacity on image 53/4, a 1.1 cm nodular opacity on image 45/4 and a 0.9 cm nodular opacity image 48/4. No suspicious left-sided pulmonary nodules or masses. No pleural effusion. No pneumothorax. Apical predominant paraseptal emphysematous change. Musculoskeletal: No aggressive lytic or blastic lesion bone. Multilevel degenerative changes spine. CT ABDOMEN PELVIS FINDINGS Hepatobiliary: No suspicious hepatic lesion. Gallbladder is unremarkable. No biliary ductal dilation. Pancreas: No pancreatic ductal dilation or evidence of acute inflammation. Spleen: Within normal limits. Adrenals/Urinary Tract: Bilateral adrenal glands are unremarkable. No hydronephrosis. Left cortical renal scarring. No solid enhancing renal lesion. Mild wall thickening of an incompletely distended urinary bladder. Stomach/Bowel: Stomach is unremarkable for degree of distension. No pathologic dilation of small or large bowel. The appendix and terminal ileum appear normal. Colon is predominately decompressed limiting evaluation. Vascular/Lymphatic: Aortic and branch vessel atherosclerosis without abdominal aortic aneurysm. No pathologically enlarged abdominal or pelvic lymph nodes. Reproductive: Dystrophic prostatic calcifications. Other: No significant  abdominopelvic ascites. Musculoskeletal: Multilevel degenerative changes spine. 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 IMPRESSION: 1. Right hilar mass which narrows the right middle lobe pulmonary artery and bronchus with obstruction of the lateral segment bronchus of the right middle lobe. The mass is difficult to accurately measure given the associated postobstructive atelectasis, but measures approximately 5.6 x 4.0 cm. 2. Supraclavicular, mediastinal and right hilar adenopathy. 3. Multiple ground-glass nodule in opacities some of which demonstrate a central solid component. Findings which are nonspecific and may be infectious and inflammatory versus reflecting metastatic disease involvement. Suggest attention on short-term interval follow-up study in 3 months. 4. No evidence of metastatic disease within the abdomen or pelvis. 5. Mild wall thickening of an incompletely distended urinary bladder. Correlate with urinalysis to exclude cystitis. 6. 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 7. Aortic Atherosclerosis (ICD10-I70.0). Electronically Signed   By: Dahlia Bailiff M.D.   On: 10/07/2021 19:07   MR BRAIN W WO CONTRAST  Result Date: 10/06/2021 CLINICAL DATA:  Brain mass or lesion EXAM: MRI HEAD WITHOUT AND WITH CONTRAST TECHNIQUE: Multiplanar, multiecho pulse sequences of the brain and surrounding structures were obtained without and with intravenous contrast. CONTRAST:  8.36m GADAVIST GADOBUTROL 1 MMOL/ML IV SOLN COMPARISON:  No prior MRI, correlation is made with same day CT brain FINDINGS: Brain: Multiple peripherally enhancing intracranial lesions, the largest of which is a 2.2 x 1.5 x 2.3 cm (AP x TR x CC) mass along the right frontal falx and is favored to extra-axial (series 16, image 138 and series 19, image 16).  Significant surrounding T2 hyperintense signal, likely edema,  which extends into the anterior and posterior right frontal lobe. Additional peripherally enhancing intraparenchymal lesions are also noted in the anterior left frontal lobe and posterior left parietal lobe. The left frontal lobe lesion measures 1.1 x 0.7 x 0.9 cm (series 16, image 121 and series 19, image 10). The left parietal lesion measures 0.8 x 0.7 x 0.6 cm (series 16, image 124 and series 19, image 26). There is significant edema associated with the left frontal but not the left parietal lesion. No acute infarct, hemorrhage, hydrocephalus, extra-axial collection, or midline shift. Vascular: Normal flow voids. Skull and upper cervical spine: Intrinsically T1 hyperintense lesion in the clivus (series 16, image 31), indeterminate for enhancement. Otherwise normal marrow signal. Sinuses/Orbits: Mucosal thickening in the ethmoid air cells. Otherwise negative. Other: The mastoids are well aerated. IMPRESSION: Multiple peripherally enhancing intracranial lesions, the largest of which appears to be dural based, along the right aspect of the falx, with 2 additional peripherally enhancing lesions in the left frontal and parietal lobes. These are overall concerning for metastatic disease. Electronically Signed   By: Merilyn Baba M.D.   On: 10/06/2021 23:07   CT ABDOMEN PELVIS W CONTRAST  Result Date: 10/07/2021 CLINICAL DATA:  Metastatic disease evaluation. EXAM: CT CHEST, ABDOMEN, AND PELVIS WITH CONTRAST TECHNIQUE: Multidetector CT imaging of the chest, abdomen and pelvis was performed following the standard protocol during bolus administration of intravenous contrast. CONTRAST:  72m OMNIPAQUE IOHEXOL 350 MG/ML SOLN COMPARISON:  No relevant priors available at time dictation. FINDINGS: CT CHEST FINDINGS Cardiovascular: Aortic and branch vessel atherosclerosis without aneurysmal dilation. No central pulmonary embolus on this nondedicated study. Normal size heart. No significant pericardial effusion/thickening.  Mediastinum/Nodes: Supraclavicular adenopathy for instance a 2.2 cm left supraclavicular lymph node on image 4/2. No discrete thyroid nodule. Mediastinal and right hilar adenopathy. For reference 2.2 cm a low right paratracheal lymph node on image 22/2 and a 1.8 cm right hilar lymph node on image 28/2. No pathologically enlarged axillary lymph nodes. Surgical clips in the right axilla. Lungs/Pleura: Right hilar mass which narrows the right middle lobe pulmonary artery and bronchus with obstruction of the lateral segment bronchus of the right middle lobe. The mass is difficult to accurately measure given the associated postobstructive atelectasis, but measures approximately 5.6 x 4.0 cm on image 36/2. There are multiple ground-glass nodular opacities some of which demonstrate a central solid component for instance a 1.8 cm ground-glass opacity on image 53/4, a 1.1 cm nodular opacity on image 45/4 and a 0.9 cm nodular opacity image 48/4. No suspicious left-sided pulmonary nodules or masses. No pleural effusion. No pneumothorax. Apical predominant paraseptal emphysematous change. Musculoskeletal: No aggressive lytic or blastic lesion bone. Multilevel degenerative changes spine. CT ABDOMEN PELVIS FINDINGS Hepatobiliary: No suspicious hepatic lesion. Gallbladder is unremarkable. No biliary ductal dilation. Pancreas: No pancreatic ductal dilation or evidence of acute inflammation. Spleen: Within normal limits. Adrenals/Urinary Tract: Bilateral adrenal glands are unremarkable. No hydronephrosis. Left cortical renal scarring. No solid enhancing renal lesion. Mild wall thickening of an incompletely distended urinary bladder. Stomach/Bowel: Stomach is unremarkable for degree of distension. No pathologic dilation of small or large bowel. The appendix and terminal ileum appear normal. Colon is predominately decompressed limiting evaluation. Vascular/Lymphatic: Aortic and branch vessel atherosclerosis without abdominal aortic  aneurysm. No pathologically enlarged abdominal or pelvic lymph nodes. Reproductive: Dystrophic prostatic calcifications. Other: No significant abdominopelvic ascites. Musculoskeletal: Multilevel degenerative changes spine. Chronic L5 pars defects with grade 1 L5 on  S1 anterolisthesis and prominent L5-S1 discogenic disease. IMPRESSION: 1. Right hilar mass which narrows the right middle lobe pulmonary artery and bronchus with obstruction of the lateral segment bronchus of the right middle lobe. The mass is difficult to accurately measure given the associated postobstructive atelectasis, but measures approximately 5.6 x 4.0 cm. 2. Supraclavicular, mediastinal and right hilar adenopathy. 3. Multiple ground-glass nodule in opacities some of which demonstrate a central solid component. Findings which are nonspecific and may be infectious and inflammatory versus reflecting metastatic disease involvement. Suggest attention on short-term interval follow-up study in 3 months. 4. No evidence of metastatic disease within the abdomen or pelvis. 5. Mild wall thickening of an incompletely distended urinary bladder. Correlate with urinalysis to exclude cystitis. 6. 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 7. Aortic Atherosclerosis (ICD10-I70.0). Electronically Signed   By: Dahlia Bailiff M.D.   On: 10/07/2021 19:07   US Venous Img Lower Unilateral Left  Result Date: 10/06/2021 CLINICAL DATA:  Pain. EXAM: Left LOWER EXTREMITY VENOUS DOPPLER ULTRASOUND TECHNIQUE: Gray-scale sonography with compression, as well as color and duplex ultrasound, were performed to evaluate the deep venous system(s) from the level of the common femoral vein through the popliteal and proximal calf veins. COMPARISON:  None. FINDINGS: VENOUS Normal compressibility of the common femoral, superficial femoral, and popliteal veins, as well as the visualized calf veins. Visualized portions of  profunda femoral vein and great saphenous vein unremarkable. No filling defects to suggest DVT on grayscale or color Doppler imaging. Doppler waveforms show normal direction of venous flow, normal respiratory plasticity and response to augmentation. Limited views of the contralateral common femoral vein are unremarkable. OTHER Slow flow noted within the left popliteal vein of uncertain etiology. Limitations: none IMPRESSION: No evidence for deep venous thrombosis in the left lower extremity. Electronically Signed   By: Ronney Asters M.D.   On: 10/06/2021 20:09   DG Chest Port 1 View  Result Date: 10/09/2021 CLINICAL DATA:  Respiratory failure. EXAM: PORTABLE CHEST 1 VIEW COMPARISON:  Radiographs 10/06/2021 and 08/11/2021.  CT 10/07/2021. FINDINGS: 1244 hours. Two views obtained. The heart size and mediastinal contours are stable, with subtle evidence of mediastinal and right hilar adenopathy. There is persistent right middle lobe partial collapse and opacity, similar to recent prior studies. The left lung is clear. There is no pleural effusion or pneumothorax. No discrete peripheral lung lesions are identified. Surgical clips are present within the right axilla. IMPRESSION: 1. Stable findings of mediastinal adenopathy, right hilar mass and right middle lobe collapse compared with recent prior studies. Based on the CT, findings are consistent with metastatic disease. 2. No acute superimposed process identified. Electronically Signed   By: Richardean Sale M.D.   On: 10/09/2021 14:57   DG Chest Portable 1 View  Result Date: 10/06/2021 CLINICAL DATA:  Pain EXAM: PORTABLE CHEST 1 VIEW COMPARISON:  08/11/2021 FINDINGS: Persistent right middle lobe opacity no pleural effusion. Stable cardiomediastinal silhouette. IMPRESSION: Persistent right middle lobe opacity, given lack of interval clearing, recommend CT chest for further evaluation. Electronically Signed   By: Donavan Foil M.D.   On: 10/06/2021 19:08       Discharge Exam: Vitals:   10/09/21 1312 10/09/21 1336  BP: 122/71 136/79  Pulse: 72 68  Resp: 17 16  Temp: 97.8 F (36.6 C)   SpO2: 93% 100%   Vitals:   10/09/21 1252 10/09/21 1300 10/09/21 1312 10/09/21 1336  BP: 118/70 125/69 122/71 136/79  Pulse: 80 75 72 68  Resp: _0 Temp:   97.8 F (36.6 C)   TempSrc:      SpO2: 93% (!) 88% 93% 100%  Weight:      Height:         The results of significant diagnostics from this hospitalization (including imaging, microbiology, ancillary and laboratory) are listed below for reference.     Microbiology: Recent Results (from the past 240 hour(s))  Resp Panel by RT-PCR (Flu A&B, Covid) Nasopharyngeal Swab     Status: None   Collection Time: 10/06/21  8:04 PM   Specimen: Nasopharyngeal Swab; Nasopharyngeal(NP) swabs in vial transport medium  Result Value Ref Range Status   SARS Coronavirus 2 by RT PCR NEGATIVE NEGATIVE Final    Comment: (NOTE) SARS-CoV-2 target nucleic acids are NOT DETECTED.  The SARS-CoV-2 RNA is generally detectable in upper respiratory specimens during the acute phase of infection. The lowest concentration of SARS-CoV-2 viral copies this assay can detect is 138 copies/mL. A negative result does not preclude SARS-Cov-2 infection and should not be used as the sole basis for treatment or other patient management decisions. A negative result may occur with  improper specimen collection/handling, submission of specimen other than nasopharyngeal swab, presence of viral mutation(s) within the areas targeted by this assay, and inadequate number of viral copies(<138 copies/mL). A negative result must be combined with clinical observations, patient history, and epidemiological information. The expected result is Negative.  Fact Sheet for Patients:  EntrepreneurPulse.com.au  Fact Sheet for Healthcare Providers:  IncredibleEmployment.be  This test is no t yet approved  or cleared by the [place] FDA and  has been authorized for detection and/or diagnosis of SARS-CoV-2 by FDA under an Emergency Use Authorization (EUA). This EUA will remain  in effect (meaning this test can be used) for the duration of the COVID-19 declaration under Section 564(b)(1) of the Act, 21 U.S.C.section 360bbb-3(b)(1), unless the authorization is terminated  or revoked sooner.       Influenza A by PCR NEGATIVE NEGATIVE Final   Influenza B by PCR NEGATIVE NEGATIVE Final    Comment: (NOTE) The Xpert Xpress SARS-CoV-2/FLU/RSV plus assay is intended as an aid in the diagnosis of influenza from Nasopharyngeal swab specimens and should not be used as a sole basis for treatment. Nasal washings and aspirates are unacceptable for Xpert Xpress SARS-CoV-2/FLU/RSV testing.  Fact Sheet for Patients: EntrepreneurPulse.com.au  Fact Sheet for Healthcare Providers: IncredibleEmployment.be  This test is not yet approved or cleared by the [place] FDA and has been authorized for detection and/or diagnosis of SARS-CoV-2 by FDA under an Emergency Use Authorization (EUA). This EUA will remain in effect (meaning this test can be used) for the duration of the COVID-19 declaration under Section 564(b)(1) of the Act, 21 U.S.C. section 360bbb-3(b)(1), unless the authorization is terminated or revoked.  Performed at [facility], [address] [place], [place] [address]   Anaerobic culture w Gram Stain     Status: None (Preliminary result)   Collection Time: 10/09/21 12:36 PM   Specimen: Bronchial Alveolar Lavage; Respiratory  Result Value Ref Range Status   Specimen Description   Final    BRONCHIAL ALVEOLAR LAVAGE Performed at [place] [address], [place], [place] [address]    Special Requests   Final    NONE Performed at [facility], [place] [address], [place], [place]  [address]    Gram Stain   Final  NO SQUAMOUS EPITHELIAL CELLS SEEN FEW WBC SEEN NO ORGANISMS SEEN Performed at [facility], [place] [address], [place], [place] [address]    Culture PENDING  Incomplete   Report Status PENDING  Incomplete  Culture, Respiratory w Gram Stain     Status: None (Preliminary result)   Collection Time: 10/09/21 12:36 PM   Specimen: Bronchoalveolar Lavage  Result Value Ref Range Status   Specimen Description   Final    BRONCHIAL ALVEOLAR LAVAGE Performed at [facility], [place] [address], [place], [place] [address]    Special Requests   Final    NONE Performed at [facility], [place] [address], [place], [place] [address]    Gram Stain   Final    NO SQUAMOUS EPITHELIAL CELLS SEEN FEW WBC SEEN NO ORGANISMS SEEN Performed at [facility], [place] [address], [place], [place] [address]    Culture PENDING  Incomplete   Report Status PENDING  Incomplete     Labs: BNP (last 3 results) No results for input(s): BNP in the last 8760 hours. Basic Metabolic Panel: Recent Labs  Lab 10/06/21 1812 10/07/21 0425  NA 137 135  K 3.8 3.9  CL 101 101  CO2 27 26  GLUCOSE 87 244*  BUN 12 14  CREATININE 0.46* 0.67  CALCIUM 9.1 8.9   Liver Function Tests: Recent Labs  Lab 10/06/21 1812  AST 21  ALT 27  ALKPHOS 57  BILITOT 0.2*  PROT 7.0  ALBUMIN 3.6   No results for input(s): LIPASE, AMYLASE in the last 168 hours. No results for input(s): AMMONIA in the last 168 hours. CBC: Recent Labs  Lab 10/06/21 1812 10/07/21 0425  WBC 11.0* 8.4  NEUTROABS 6.7  --   HGB 12.5* 14.3  HCT 38.0* 42.8  MCV 93.1 93.2  PLT 458* 456*   Cardiac Enzymes: No results for input(s): CKTOTAL, CKMB, CKMBINDEX, TROPONINI in the last 168 hours. BNP: Invalid input(s): POCBNP CBG: Recent Labs  Lab 10/08/21 1121 10/08/21 1628 10/08/21 2124 10/09/21 0741 10/09/21 1635  GLUCAP 90 85 145* 125* 152*   D-Dimer No results for  input(s): DDIMER in the last 72 hours. Hgb A1c No results for input(s): HGBA1C in the last 72 hours. Lipid Profile No results for input(s): CHOL, HDL, LDLCALC, TRIG, CHOLHDL, LDLDIRECT in the last 72 hours. Thyroid function studies No results for input(s): TSH, T4TOTAL, T3FREE, THYROIDAB in the last 72 hours.  Invalid input(s): FREET3 Anemia work up No results for input(s): VITAMINB12, FOLATE, FERRITIN, TIBC, IRON, RETICCTPCT in the last 72 hours. Urinalysis No results found for: COLORURINE, APPEARANCEUR, LABSPEC, [place], GLUCOSEU, [place], [place], [place], PROTEINUR, UROBILINOGEN, NITRITE, LEUKOCYTESUR Sepsis Labs Invalid input(s): PROCALCITONIN,  WBC,  LACTICIDVEN Microbiology Recent Results (from the past 240 hour(s))  Resp Panel by RT-PCR (Flu A&B, Covid) Nasopharyngeal Swab     Status: None   Collection Time: 10/06/21  8:04 PM   Specimen: Nasopharyngeal Swab; Nasopharyngeal(NP) swabs in vial transport medium  Result Value Ref Range Status   SARS Coronavirus 2 by RT PCR NEGATIVE NEGATIVE Final    Comment: (NOTE) SARS-CoV-2 target nucleic acids are NOT DETECTED.  The SARS-CoV-2 RNA is generally detectable in upper respiratory specimens during the acute phase of infection. The lowest concentration of SARS-CoV-2 viral copies this assay can detect is 138 copies/mL. A negative result does not preclude SARS-Cov-2 infection and should not be used as the sole basis for treatment or other patient management decisions. A negative result may  occur with  improper specimen collection/handling, submission of specimen other than nasopharyngeal swab, presence of viral mutation(s) within the areas targeted by this assay, and inadequate number of viral copies(<138 copies/mL). A negative result must be combined with clinical observations, patient history, and epidemiological information. The expected result is Negative.  Fact Sheet for Patients:   EntrepreneurPulse.com.au  Fact Sheet for Healthcare Providers:  IncredibleEmployment.be  This test is no t yet approved or cleared by the [place] FDA and  has been authorized for detection and/or diagnosis of SARS-CoV-2 by FDA under an Emergency Use Authorization (EUA). This EUA will remain  in effect (meaning this test can be used) for the duration of the COVID-19 declaration under Section 564(b)(1) of the Act, 21 U.S.C.section 360bbb-3(b)(1), unless the authorization is terminated  or revoked sooner.       Influenza A by PCR NEGATIVE NEGATIVE Final   Influenza B by PCR NEGATIVE NEGATIVE Final    Comment: (NOTE) The Xpert Xpress SARS-CoV-2/FLU/RSV plus assay is intended as an aid in the diagnosis of influenza from Nasopharyngeal swab specimens and should not be used as a sole basis for treatment. Nasal washings and aspirates are unacceptable for Xpert Xpress SARS-CoV-2/FLU/RSV testing.  Fact Sheet for Patients: EntrepreneurPulse.com.au  Fact Sheet for Healthcare Providers: IncredibleEmployment.be  This test is not yet approved or cleared by the [place] FDA and has been authorized for detection and/or diagnosis of SARS-CoV-2 by FDA under an Emergency Use Authorization (EUA). This EUA will remain in effect (meaning this test can be used) for the duration of the COVID-19 declaration under Section 564(b)(1) of the Act, 21 U.S.C. section 360bbb-3(b)(1), unless the authorization is terminated or revoked.  Performed at [facility], [address] [place], [place] [address]   Anaerobic culture w Gram Stain     Status: None (Preliminary result)   Collection Time: 10/09/21 12:36 PM   Specimen: Bronchial Alveolar Lavage; Respiratory  Result Value Ref Range Status   Specimen Description   Final    BRONCHIAL ALVEOLAR LAVAGE Performed at [facility]  [address] [place], [place] [address]    Special Requests   Final    NONE Performed at [facility], [place] [address], [place], [place] [address]    Gram Stain   Final    NO SQUAMOUS EPITHELIAL CELLS SEEN FEW WBC SEEN NO ORGANISMS SEEN Performed at [facility], [place] [address], [place], [place] [address]    Culture PENDING  Incomplete   Report Status PENDING  Incomplete  Culture, Respiratory w Gram Stain     Status: None (Preliminary result)   Collection Time: 10/09/21 12:36 PM   Specimen: Bronchoalveolar Lavage  Result Value Ref Range Status   Specimen Description   Final    BRONCHIAL ALVEOLAR LAVAGE Performed at [facility], [place] [address] [place], [place] [address]    Special Requests   Final    NONE Performed at [facility], [place] [address] [place], [place] [address]    Gram Stain   Final    NO SQUAMOUS EPITHELIAL CELLS SEEN FEW WBC SEEN NO ORGANISMS SEEN Performed at [facility], [place] [address] [place], [place] [address]    Culture PENDING  Incomplete   Report Status PENDING  Incomplete     Time coordinating discharge: 45 minutes  SIGNED:   Tawni Millers, MD  [facility] 10/10/2021, 8:34 AM

## 2021-10-11 LAB — CULTURE, RESPIRATORY W GRAM STAIN
Culture: NO GROWTH
Gram Stain: NONE SEEN

## 2021-10-12 ENCOUNTER — Encounter (HOSPITAL_COMMUNITY): Payer: Self-pay | Admitting: Pulmonary Disease

## 2021-10-12 ENCOUNTER — Inpatient Hospital Stay: Payer: BC Managed Care – PPO | Attending: Radiation Oncology

## 2021-10-12 ENCOUNTER — Other Ambulatory Visit: Payer: Self-pay

## 2021-10-12 ENCOUNTER — Telehealth: Payer: Self-pay | Admitting: *Deleted

## 2021-10-12 LAB — CYTOLOGY - NON PAP

## 2021-10-12 MED ORDER — PREDNISONE 50 MG PO TABS: ORAL_TABLET | ORAL | 0 refills | Status: DC

## 2021-10-12 NOTE — Telephone Encounter (Signed)
XXXXX

## 2021-10-13 ENCOUNTER — Telehealth: Payer: Self-pay | Admitting: Hematology

## 2021-10-13 LAB — SURGICAL PATHOLOGY

## 2021-10-13 LAB — CYTOLOGY - NON PAP

## 2021-10-13 NOTE — Telephone Encounter (Signed)
Scheduled per sch msg. Called and left msg  

## 2021-10-14 LAB — ANAEROBIC CULTURE W GRAM STAIN: Gram Stain: NONE SEEN

## 2021-10-15 ENCOUNTER — Ambulatory Visit (HOSPITAL_COMMUNITY)
Admission: RE | Admit: 2021-10-15 | Discharge: 2021-10-15 | Disposition: A | Discharge status: Home / Self Care | Payer: BC Managed Care – PPO | Source: Ambulatory Visit | Attending: Radiation Oncology | Admitting: Radiation Oncology

## 2021-10-15 ENCOUNTER — Other Ambulatory Visit: Payer: Self-pay

## 2021-10-15 DIAGNOSIS — C7931 Secondary malignant neoplasm of brain: Secondary | ICD-10-CM | POA: Insufficient documentation

## 2021-10-15 IMAGING — MR MR HEAD WO/W CM
11 of 14 series · 22 of 48 positions shown · IV contrast (gadavist)
Comparison: Brain MRI [DATE]

CLINICAL DATA: Brain metastases, staging

EXAM:
MRI HEAD WITHOUT AND WITH CONTRAST
TECHNIQUE: Multiplanar, multiecho pulse sequences of the brain and surrounding
structures were obtained without and with intravenous contrast.
CONTRAST:  8mL GADAVIST GADOBUTROL 1 MMOL/ML IV SOLN

[Series 2: FLAIR · sagittal · 3.0mm · 0.47mm/px · 1 of 51 slices shown (1 of 2)]
[im 1/51]
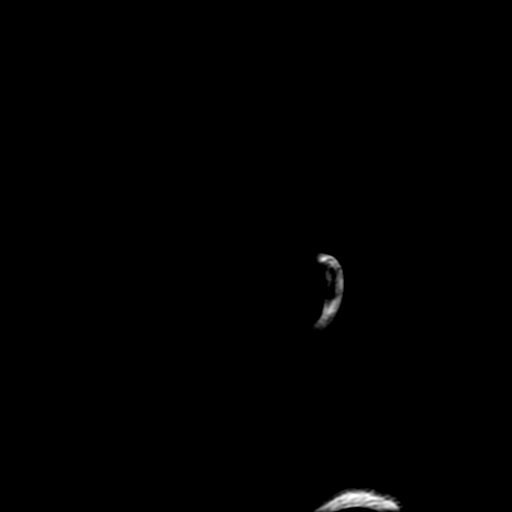

[Series 3: DWI · axial · 3.0mm · 0.94mm/px · z∈[-94,+114]mm · 3 of 142 slices shown]
[im 1/142]
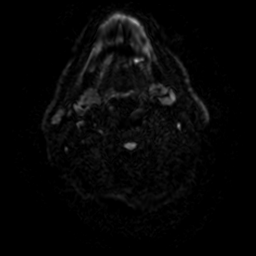
[im 71/142]
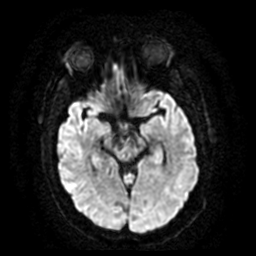
[im 142/142]
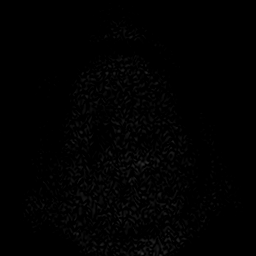

[Series 4: FLAIR · axial · 3.0mm · 0.47mm/px · z∈[-89,+136]mm · 2 of 76 slices shown (2 of 2)]
[im 1/76]
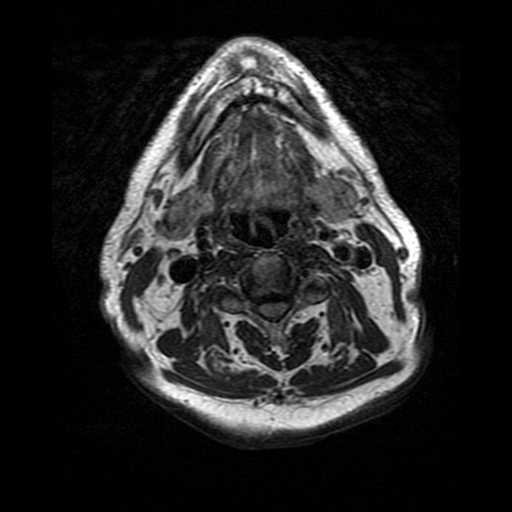
[im 76/76]
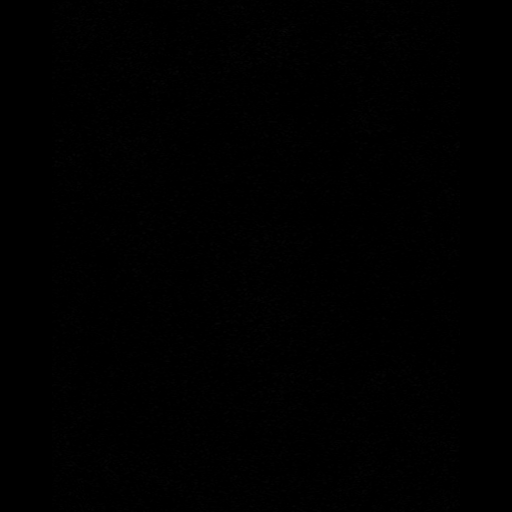

[Series 5: SWI · axial · 3.0mm · 0.47mm/px · z∈[-100,+116]mm · 3 of 146 slices shown (1 of 2)]
[im 1/146]
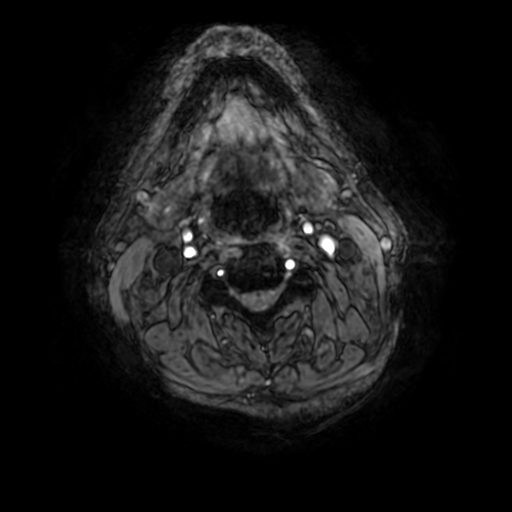
[im 73/146]
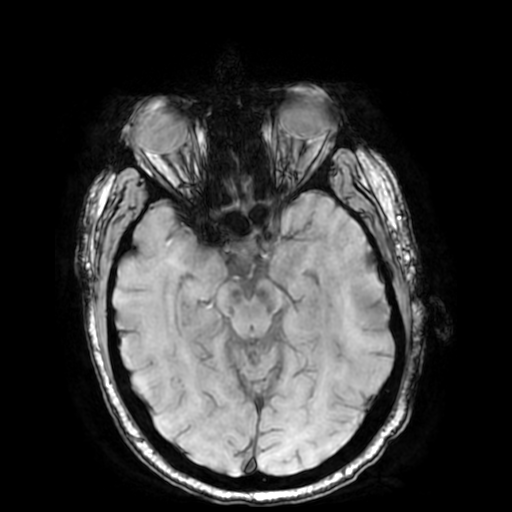
[im 146/146]
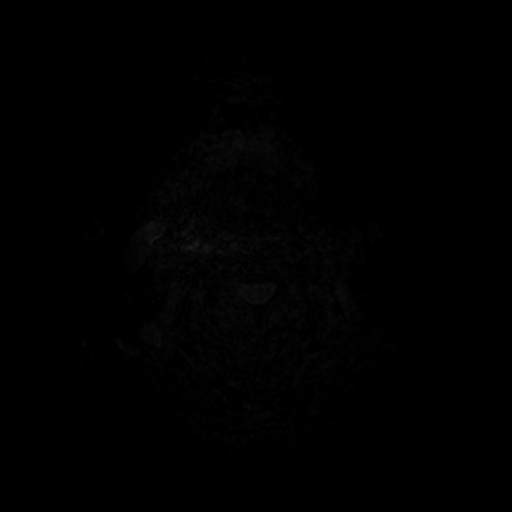

[Series 7: T2 post-contrast · coronal · 3.0mm · 0.39mm/px · 1 of 59 slices shown (1 of 2)]
[im 1/59]
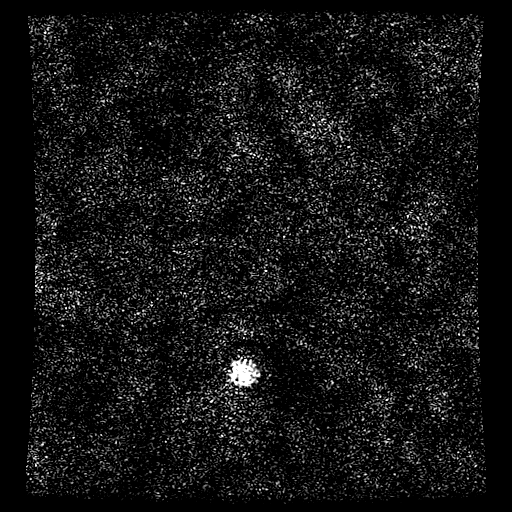

[Series 8: T2 post-contrast · axial · 5.0mm · 0.47mm/px · 1 of 36 slices shown (2 of 2)]
[im 1/36]
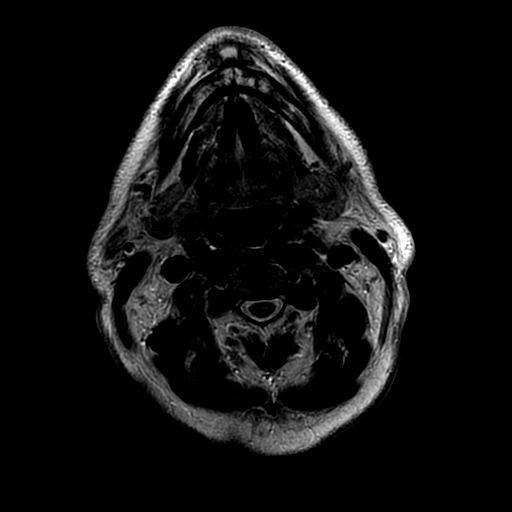

[Series 9: T1 post-contrast · coronal · 3.0mm · 0.39mm/px · 1 of 59 slices shown (1 of 2)]
[im 1/59]
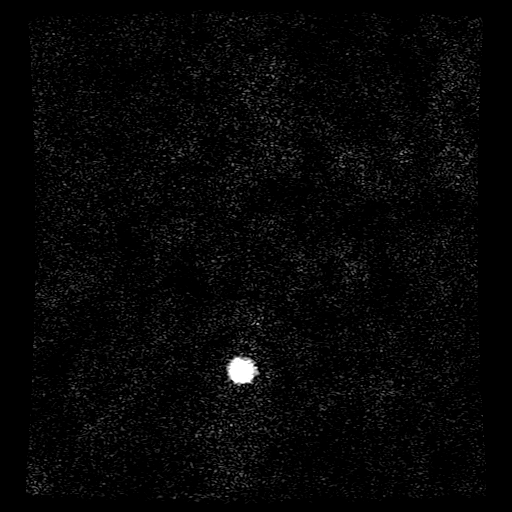

[Series 10: FLAIR post-contrast · sagittal · 3.0mm · 0.47mm/px · 1 of 51 slices shown]
[im 1/51]
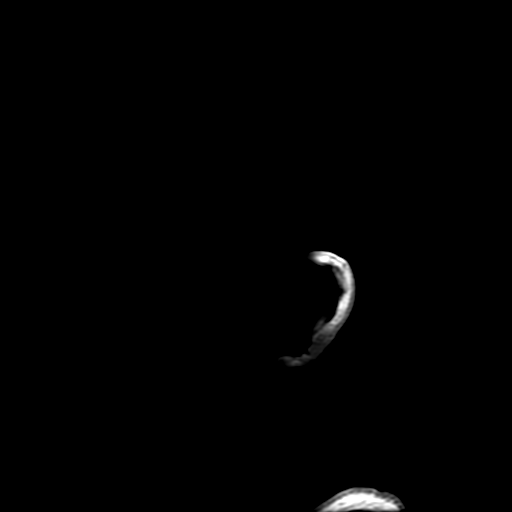

[Series 350: ADC · axial · 3.0mm · 0.94mm/px · z∈[-94,+114]mm · 2 of 71 slices shown]
[im 1/71]
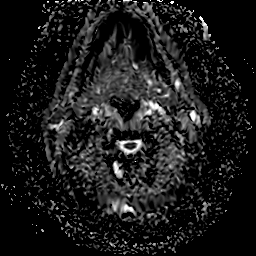
[im 71/71]
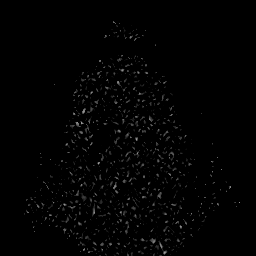

[Series 500: SWI · axial · 3.0mm · 0.47mm/px · 1 of 142 slices shown (2 of 2)]
[im 1/142]
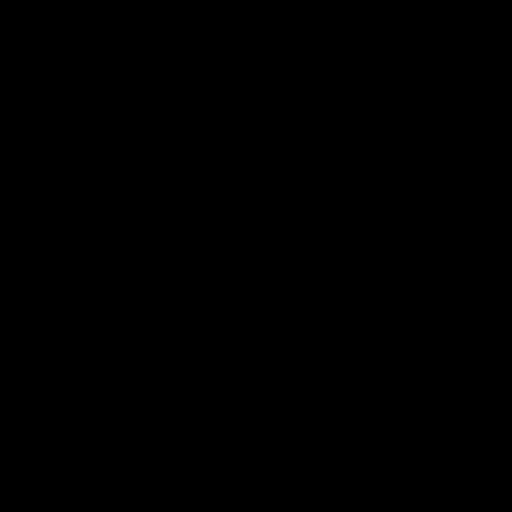

[Series 1100: T1 post-contrast · axial · 0.9mm · 0.50mm/px · z∈[-128,+126]mm · 6 of 301 slices shown (2 of 2)]
[im 1/301]
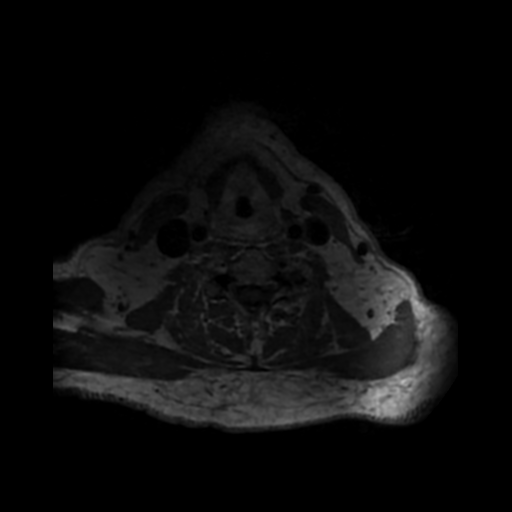
[im 61/301]
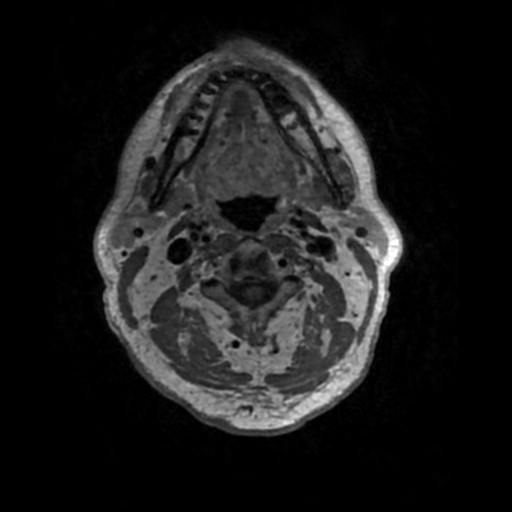
[im 121/301]
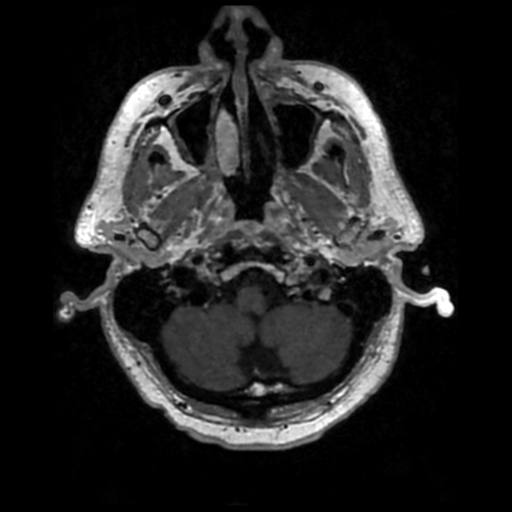
[im 181/301]
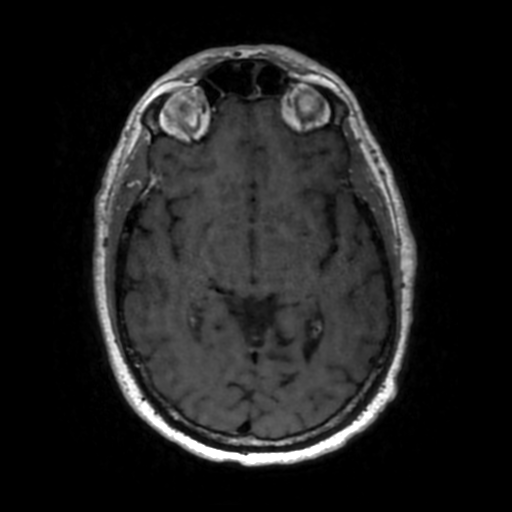
[im 241/301]
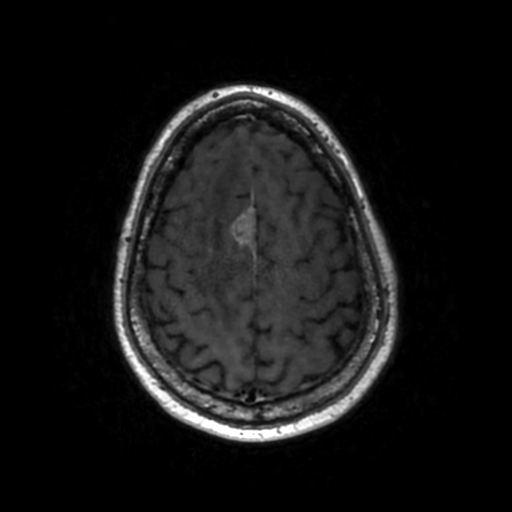
[im 301/301]
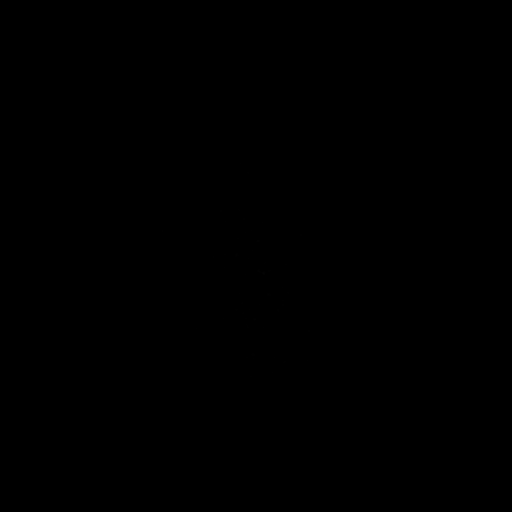

[22 of 48 positions shown; findings below may reference images not displayed]

FINDINGS: Brain: Again seen are multiple enhancing lesions:

*The largest lesion which is again favored to be extra-axial along
the right aspect of the falx measuring 2.1 cm AP by 1.2 cm TV by
cm cc is slightly decreased in size (previously measured 2.2 cm x
1.5 cm x 2.3 cm). There remains extensive edema surrounding this
lesion in the frontal lobe, slightly decreased in extent with
decreased mass effect on the right lateral ventricle (best
appreciated in the coronal plane). There are small foci of SWI
signal dropout within this lesion consistent with a small amount of
blood products.
*The 0.6 cm AP x 0.6 cm TV x 0.7 cm cc peripherally enhancing lesion
in the left superior frontal gyrus is decreased in size from 1.1 cm
x 0.7 cm x 0.9 cm. Edema surrounding this lesion is slightly
decreased.
*The 0.4 cm by 0.4 cm peripherally enhancing lesion in the left
parietal lobe has slightly decreased in size, previously measuring
up to 0.6 cm. There is no significant edema surrounding this lesion
*No new lesions are identified.

There is no evidence of acute intracranial hemorrhage, extra-axial
fluid collection, or infarct. The ventricles are stable in size.
There is no midline shift. Scattered small foci of nonspecific FLAIR
signal abnormality in the remainder of the subcortical and
periventricular white matter are not significantly changed.

Vascular: Normal flow voids.

Skull and upper cervical spine: Normal marrow signal.

Sinuses/Orbits: There is mild mucosal thickening in the paranasal
sinuses. The globes and orbits are unremarkable.

Other: None.
IMPRESSION: 1. Slight interval decrease in size of 3 enhancing intracranial
lesions as detailed above, with associated decreased surrounding
edema.
2. No new lesions.

## 2021-10-15 MED ORDER — GADOBUTROL 1 MMOL/ML IV SOLN
8.0000 mL | Freq: Once | INTRAVENOUS | Status: AC | PRN
  Administered 2021-10-15: 8 mL via INTRAVENOUS

## 2021-10-16 ENCOUNTER — Ambulatory Visit
Admission: RE | Admit: 2021-10-16 | Discharge: 2021-10-16 | Disposition: A | Discharge status: Home / Self Care | Payer: BC Managed Care – PPO | Source: Ambulatory Visit | Attending: Radiation Oncology | Admitting: Radiation Oncology

## 2021-10-16 VITALS — BP 128/76 | HR 63 | Temp 97.9°F | Resp 20 | Ht 69.0 in | Wt 193.0 lb

## 2021-10-16 DIAGNOSIS — C7931 Secondary malignant neoplasm of brain: Secondary | ICD-10-CM | POA: Diagnosis present

## 2021-10-16 DIAGNOSIS — C342 Malignant neoplasm of middle lobe, bronchus or lung: Secondary | ICD-10-CM | POA: Insufficient documentation

## 2021-10-16 MED ORDER — FLUCONAZOLE 100 MG PO TABS: ORAL_TABLET | ORAL | 0 refills | Status: DC

## 2021-10-16 MED ORDER — SODIUM CHLORIDE 0.9% FLUSH
10.0000 mL | Freq: Once | INTRAVENOUS | Status: AC
  Administered 2021-10-16: 10 mL via INTRAVENOUS

## 2021-10-16 MED ORDER — OMEPRAZOLE 20 MG PO CPDR: DELAYED_RELEASE_CAPSULE | ORAL | 0 refills | Status: DC

## 2021-10-16 NOTE — Progress Notes (Signed)
Has armband been applied?  Yes.    Does patient have an allergy to IV contrast dye?: Yes.     Has patient ever received premedication for IV contrast dye?: Yes.     Does patient take metformin?: No.  Date of lab work: October 07, 2021 BUN: 14 CR: 0.67 eGFR: >60  IV site: forearm left, condition patent and no redness  Has IV site been added to flowsheet?  Yes.    BP 128/76 (BP Location: Left Arm, Patient Position: Sitting, Cuff Size: Large)   Pulse 63   Temp 97.9 F (36.6 C)   Resp 20   Ht 5' 9" (1.753 m)   Wt 193 lb (87.5 kg)   SpO2 100%   BMI 28.50 kg/m   

## 2021-10-19 ENCOUNTER — Other Ambulatory Visit: Payer: Self-pay

## 2021-10-19 ENCOUNTER — Inpatient Hospital Stay: Payer: BC Managed Care – PPO

## 2021-10-19 ENCOUNTER — Encounter: Payer: Self-pay | Admitting: Hematology

## 2021-10-19 DIAGNOSIS — C3490 Malignant neoplasm of unspecified part of unspecified bronchus or lung: Secondary | ICD-10-CM

## 2021-10-20 ENCOUNTER — Inpatient Hospital Stay: Payer: BC Managed Care – PPO | Attending: Hematology | Admitting: Hematology

## 2021-10-20 ENCOUNTER — Inpatient Hospital Stay: Payer: BC Managed Care – PPO

## 2021-10-20 ENCOUNTER — Other Ambulatory Visit: Payer: Self-pay

## 2021-10-20 VITALS — BP 139/87 | HR 71 | Temp 97.9°F | Resp 18 | Ht 69.0 in | Wt 203.3 lb

## 2021-10-20 DIAGNOSIS — F17210 Nicotine dependence, cigarettes, uncomplicated: Secondary | ICD-10-CM | POA: Insufficient documentation

## 2021-10-20 DIAGNOSIS — Z801 Family history of malignant neoplasm of trachea, bronchus and lung: Secondary | ICD-10-CM | POA: Diagnosis not present

## 2021-10-20 DIAGNOSIS — Z7289 Other problems related to lifestyle: Secondary | ICD-10-CM | POA: Insufficient documentation

## 2021-10-20 DIAGNOSIS — I700 Atherosclerosis of aorta: Secondary | ICD-10-CM | POA: Insufficient documentation

## 2021-10-20 DIAGNOSIS — Z8249 Family history of ischemic heart disease and other diseases of the circulatory system: Secondary | ICD-10-CM | POA: Diagnosis not present

## 2021-10-20 DIAGNOSIS — C342 Malignant neoplasm of middle lobe, bronchus or lung: Secondary | ICD-10-CM | POA: Diagnosis present

## 2021-10-20 DIAGNOSIS — R531 Weakness: Secondary | ICD-10-CM | POA: Diagnosis not present

## 2021-10-20 DIAGNOSIS — C3490 Malignant neoplasm of unspecified part of unspecified bronchus or lung: Secondary | ICD-10-CM

## 2021-10-20 DIAGNOSIS — C7931 Secondary malignant neoplasm of brain: Secondary | ICD-10-CM | POA: Insufficient documentation

## 2021-10-20 LAB — CBC WITH DIFFERENTIAL (CANCER CENTER ONLY)
Abs Immature Granulocytes: 0.98 10*3/uL — ABNORMAL HIGH (ref 0.00–0.07)
Basophils Absolute: 0.2 10*3/uL — ABNORMAL HIGH (ref 0.0–0.1)
Basophils Relative: 1 %
Eosinophils Absolute: 0 10*3/uL (ref 0.0–0.5)
Eosinophils Relative: 0 %
HCT: 35.5 % — ABNORMAL LOW (ref 39.0–52.0)
Hemoglobin: 12 g/dL — ABNORMAL LOW (ref 13.0–17.0)
Immature Granulocytes: 4 %
Lymphocytes Relative: 7 %
Lymphs Abs: 1.6 10*3/uL (ref 0.7–4.0)
MCH: 31.5 pg (ref 26.0–34.0)
MCHC: 33.8 g/dL (ref 30.0–36.0)
MCV: 93.2 fL (ref 80.0–100.0)
Monocytes Absolute: 1.6 10*3/uL — ABNORMAL HIGH (ref 0.1–1.0)
Monocytes Relative: 7 %
Neutro Abs: 20.5 10*3/uL — ABNORMAL HIGH (ref 1.7–7.7)
Neutrophils Relative %: 81 %
Platelet Count: 274 10*3/uL (ref 150–400)
RBC: 3.81 MIL/uL — ABNORMAL LOW (ref 4.22–5.81)
RDW: 14.6 % (ref 11.5–15.5)
WBC Count: 24.9 10*3/uL — ABNORMAL HIGH (ref 4.0–10.5)
nRBC: 0 % (ref 0.0–0.2)

## 2021-10-20 LAB — CMP (CANCER CENTER ONLY)
ALT: 26 U/L (ref 0–44)
AST: 14 U/L — ABNORMAL LOW (ref 15–41)
Albumin: 2.9 g/dL — ABNORMAL LOW (ref 3.5–5.0)
Alkaline Phosphatase: 46 U/L (ref 38–126)
Anion gap: 7 (ref 5–15)
BUN: 15 mg/dL (ref 6–20)
CO2: 31 mmol/L (ref 22–32)
Calcium: 8.2 mg/dL — ABNORMAL LOW (ref 8.9–10.3)
Chloride: 97 mmol/L — ABNORMAL LOW (ref 98–111)
Creatinine: 0.7 mg/dL (ref 0.61–1.24)
GFR, Estimated: >60 mL/min (ref >60)
Glucose, Bld: 91 mg/dL (ref 70–99)
Potassium: 4.5 mmol/L (ref 3.5–5.1)
Sodium: 135 mmol/L (ref 135–145)
Total Bilirubin: 0.3 mg/dL (ref 0.3–1.2)
Total Protein: 5.6 g/dL — ABNORMAL LOW (ref 6.5–8.1)

## 2021-10-20 NOTE — Progress Notes (Addendum)
[place]  [phone] [phone]    Oncology Clinic Visit Note  Encounter date.10/20/2021    Referral MD: Dr. Riccardo Dubin Arrien  CC: newly diagnosed metastatic lung adenocarcinoma with brain mets.   HPI: Mr. Kalb is a 60 year old male with a past medical history significant for melanoma currently in remission followed at [facility].  He presented to the emergency department with left-sided weakness.  He was noted to have left lower extremity weakness for about 3 to 4 days and then developed left upper extremity weakness the day of admission.  MRI of the brain showed multiple peripherally enhancing intracranial lesions, the largest of which appears to be dural based along the right aspect of the falx with 2 additional peripheral enhancing lesions at the left frontal and parietal lobes which are concerning for metastatic disease.  CT chest/abdomen/pelvis showed a right hilar mass which narrows the right middle lobe pulmonary artery and bronchus with obstruction of the lateral segment bronchus in the right middle lobe, mass difficult to accurately measure given the associated postobstructive atelectasis but measures approximately 5.6 x 4.0 cm, supraclavicular, mediastinal, and right hilar adenopathy, multiple groundglass opacities which are nonspecific and may be infectious and inflammatory versus reflecting metastatic disease involvement, no evidence of metastatic disease in the abdomen/pelvis.  The patient underwent bronchoscopy with EBUS earlier today and note from PCCM indicates that the samples from all 3 lymph nodes are consistent with non-small cell lung cancer.   I met with the patient, his wife, and son in his hospital room.  Since starting on dexamethasone, the patient reports that his left-sided weakness is much improved.  He is eating well.  He is not having any headaches or dizziness at this time.  He has no chest pain or shortness of breath but  does have a mild cough.  He is not having any abdominal pain, nausea, vomiting.  He tells me that he has been followed at [facility] for many years for his history of melanoma.  He received about 6 months of interferon in the past for the melanoma.  Medical Oncology was asked to see the patient to make recommendations regarding his newly diagnosed non-small cell lung cancer.  Patient notes some intermittent headaches.  No acute new shortness of breath or chest pain. Did have neuronavigational MRI done 10/15/2021 which showed 1. Slight interval decrease in size of 3 enhancing intracranial lesions as detailed above, with associated decreased surrounding edema. 2. No new lesions.   He has being simulated and has been scheduled for his [place] treatment on 10/23/2021. We discussed that we have sent out molecular testing on lung cancer tissue awaiting molecular results to decide on both optimal systemic treatment plan after completion of radiation, He has been cutting back on his smoking. Tolerating steroids.  Steroid taper as per radiation oncology. No infection issues.      Past Medical History:  Diagnosis Date   Abnormal TSH 09/28/2017   Anxiety 07/03/2012   Depression     Difficulty sleeping 09/28/2017   Examination of participant in clinical trial 09/11/2012   History of immunotherapy 02/23/2016   Melanoma of skin 10/26/2011   Nicotine dependence 07/17/2015   Smoker    :         Past Surgical History:  Procedure Laterality Date   LEFT POPLITEAL MASS RESECTION   08/27/2011    pigmented villonodular synovitis, negative for malignancy   LYMPH GLAND EXCISION   07/12/2011    right axilla, consistent with  melanoma   LYMPH NODE DISSECTION   08/27/2011    right, 0/17 nodes positive  :             Current Facility-Administered Medications  Medication Dose Route Frequency Provider Last Rate Last Admin   acetaminophen (TYLENOL) tablet 650 mg  650 mg Oral Q6H PRN Olalere, Adewale A, MD        Or    acetaminophen (TYLENOL) suppository 650 mg  650 mg Rectal Q6H PRN Olalere, Adewale A, MD       dexamethasone (DECADRON) injection 6 mg  6 mg Intravenous Q6H Olalere, Adewale A, MD   6 mg at 10/09/21 0450   insulin aspart (novoLOG) injection 0-9 Units  0-9 Units Subcutaneous TID WC Olalere, Adewale A, MD   1 Units at 10/08/21 0740   lactated ringers infusion   Intravenous Continuous Olalere, Adewale A, MD   New Bag at 10/09/21 1130   nicotine (NICODERM CQ - dosed in mg/24 hours) patch 21 mg  21 mg Transdermal Once Olalere, Adewale A, MD       pantoprazole (PROTONIX) EC tablet 40 mg  40 mg Oral Daily Olalere, Adewale A, MD   40 mg at 10/08/21 9562             Allergies  Allergen Reactions   Iopamidol Hives and Itching      Isovue break through with prep, Hives and Itching   Patient had PET?CT scan with 125 mls of Isovue-300. Noted to have two raised itchy hives which self-resolved. Patient had PET/CT scan with contrast on 05/27/2014 while on prednisone prep (3 doses of 50 mg-13 hour prep) and noted to have two hives which self -resolved without treatment   Iodinated Diagnostic Agents Rash   Iodine-131 Rash  :         Family History  Problem Relation Age of Onset   Hypertension Mother     CAD Mother     Lung cancer Father    :    Social History         Socioeconomic History   Marital status: Divorced      Spouse name: Not on file   Number of children: 3   Years of education: Not on file   Highest education level: Not on file  Occupational History      Employer: [employer]  Tobacco Use   Smoking status: Every Day      Packs/day: 0.25      Types: Cigarettes   Smokeless tobacco: Never  Vaping Use   Vaping Use: Never used  Substance and Sexual Activity   Alcohol use: Yes      Alcohol/week: 6.0 standard drinks      Types: 6 Shots of liquor per week   Drug use: No   Sexual activity: Not on file  Other Topics Concern   Not on file  Social History Narrative   Not on  file    Social Determinants of Health    Financial Resource Strain: Not on file  Food Insecurity: Not on file  Transportation Needs: Not on file  Physical Activity: Not on file  Stress: Not on file  Social Connections: Not on file  Intimate Partner Violence: Not on file  :   Review of Systems:.10 Point review of Systems was done is negative except as noted above.   Exam:.BP 139/87 (BP Location: Left Arm, Patient Position: Sitting)   Pulse 71   Temp 97.9 F (36.6 C) (Tympanic)  Resp 18   Ht '5\' 9"'  (1.753 m)   Wt 203 lb 4.8 oz (92.2 kg)   SpO2 100%   BMI 30.02 kg/m  . GENERAL:alert, in no acute distress and comfortable SKIN: no acute rashes, no significant lesions EYES: conjunctiva are pink and non-injected, sclera anicteric OROPHARYNX: MMM, no exudates, no oropharyngeal erythema or ulceration NECK: supple, no JVD LYMPH:  no palpable lymphadenopathy in the cervical, axillary or inguinal regions LUNGS: clear to auscultation b/l with normal respiratory effort HEART: regular rate & rhythm ABDOMEN:  normoactive bowel sounds , non tender, not distended. Extremity: no pedal edema PSYCH: alert & oriented x 3 with fluent speech NEURO: no focal motor/sensory deficits   LABS  . CBC Latest Ref Rng & Units 10/20/2021 10/07/2021 10/06/2021  WBC 4.0 - 10.5 K/uL 24.9(H) 8.4 11.0(H)  Hemoglobin 13.0 - 17.0 g/dL 12.0(L) 14.3 12.5(L)  Hematocrit 39.0 - 52.0 % 35.5(L) 42.8 38.0(L)  Platelets 150 - 400 K/uL 274 456(H) 458(H)    . CMP Latest Ref Rng & Units 10/20/2021 10/07/2021 10/06/2021  Glucose 70 - 99 mg/dL 91 244(H) 87  BUN 6 - 20 mg/dL '15 14 12  ' Creatinine 0.61 - 1.24 mg/dL 0.70 0.67 0.46(L)  Sodium 135 - 145 mmol/L 135 135 137  Potassium 3.5 - 5.1 mmol/L 4.5 3.9 3.8  Chloride 98 - 111 mmol/L 97(L) 101 101  CO2 22 - 32 mmol/L '31 26 27  ' Calcium 8.9 - 10.3 mg/dL 8.2(L) 8.9 9.1  Total Protein 6.5 - 8.1 g/dL 5.6(L) - 7.0  Total Bilirubin 0.3 - 1.2 mg/dL 0.3 - 0.2(L)  Alkaline  Phos 38 - 126 U/L 46 - 57  AST 15 - 41 U/L 14(L) - 21  ALT 0 - 44 U/L 26 - 27    Imaging Results  CT Head Wo Contrast   Result Date: 10/06/2021 CLINICAL DATA:  Neuro deficit, acute, stroke suspected. Left sided weakness. History of melanoma. EXAM: CT HEAD WITHOUT CONTRAST TECHNIQUE: Contiguous axial images were obtained from the base of the skull through the vertex without intravenous contrast. COMPARISON:  07/28/2011 head MRI report FINDINGS: Brain: There is extensive vasogenic edema in the right frontal lobe with milder edema anteriorly in the left frontal lobe. There is a suspected 2 cm mass in the parasagittal right frontal lobe. Edema results in mild mass effect on the frontal horn of the right lateral ventricle. No intracranial hemorrhage, definite acute cortically based infarct, significant midline shift, or extra-axial fluid collection is identified. Vascular: Calcified atherosclerosis at the skull base. No hyperdense vessel. Skull: No fracture or suspicious osseous lesion. Sinuses/Orbits: Mild mucosal thickening in the paranasal sinuses. Clear mastoid air cells. Unremarkable orbits. Other: None. IMPRESSION: Vasogenic edema in the frontal lobes, extensive on the right. This is most concerning for metastatic disease, and a brain MRI without and with contrast is recommended for further evaluation. Electronically Signed   By: Logan Bores M.D.   On: 10/06/2021 18:59    CT CHEST W CONTRAST   Result Date: 10/07/2021 CLINICAL DATA:  Metastatic disease evaluation. EXAM: CT CHEST, ABDOMEN, AND PELVIS WITH CONTRAST TECHNIQUE: Multidetector CT imaging of the chest, abdomen and pelvis was performed following the standard protocol during bolus administration of intravenous contrast. CONTRAST:  26m OMNIPAQUE IOHEXOL 350 MG/ML SOLN COMPARISON:  No relevant priors available at time dictation. FINDINGS: CT CHEST FINDINGS Cardiovascular: Aortic and branch vessel atherosclerosis without aneurysmal dilation. No  central pulmonary embolus on this nondedicated study. Normal size heart. No significant pericardial effusion/thickening. Mediastinum/Nodes: Supraclavicular  adenopathy for instance a 2.2 cm left supraclavicular lymph node on image 4/2. No discrete thyroid nodule. Mediastinal and right hilar adenopathy. For reference 2.2 cm a low right paratracheal lymph node on image 22/2 and a 1.8 cm right hilar lymph node on image 28/2. No pathologically enlarged axillary lymph nodes. Surgical clips in the right axilla. Lungs/Pleura: Right hilar mass which narrows the right middle lobe pulmonary artery and bronchus with obstruction of the lateral segment bronchus of the right middle lobe. The mass is difficult to accurately measure given the associated postobstructive atelectasis, but measures approximately 5.6 x 4.0 cm on image 36/2. There are multiple ground-glass nodular opacities some of which demonstrate a central solid component for instance a 1.8 cm ground-glass opacity on image 53/4, a 1.1 cm nodular opacity on image 45/4 and a 0.9 cm nodular opacity image 48/4. No suspicious left-sided pulmonary nodules or masses. No pleural effusion. No pneumothorax. Apical predominant paraseptal emphysematous change. Musculoskeletal: No aggressive lytic or blastic lesion bone. Multilevel degenerative changes spine. CT ABDOMEN PELVIS FINDINGS Hepatobiliary: No suspicious hepatic lesion. Gallbladder is unremarkable. No biliary ductal dilation. Pancreas: No pancreatic ductal dilation or evidence of acute inflammation. Spleen: Within normal limits. Adrenals/Urinary Tract: Bilateral adrenal glands are unremarkable. No hydronephrosis. Left cortical renal scarring. No solid enhancing renal lesion. Mild wall thickening of an incompletely distended urinary bladder. Stomach/Bowel: Stomach is unremarkable for degree of distension. No pathologic dilation of small or large bowel. The appendix and terminal ileum appear normal. Colon is predominately  decompressed limiting evaluation. Vascular/Lymphatic: Aortic and branch vessel atherosclerosis without abdominal aortic aneurysm. No pathologically enlarged abdominal or pelvic lymph nodes. Reproductive: Dystrophic prostatic calcifications. Other: No significant abdominopelvic ascites. Musculoskeletal: Multilevel degenerative changes spine. 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 IMPRESSION: 1. Right hilar mass which narrows the right middle lobe pulmonary artery and bronchus with obstruction of the lateral segment bronchus of the right middle lobe. The mass is difficult to accurately measure given the associated postobstructive atelectasis, but measures approximately 5.6 x 4.0 cm. 2. Supraclavicular, mediastinal and right hilar adenopathy. 3. Multiple ground-glass nodule in opacities some of which demonstrate a central solid component. Findings which are nonspecific and may be infectious and inflammatory versus reflecting metastatic disease involvement. Suggest attention on short-term interval follow-up study in 3 months. 4. No evidence of metastatic disease within the abdomen or pelvis. 5. Mild wall thickening of an incompletely distended urinary bladder. Correlate with urinalysis to exclude cystitis. 6. 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 7. Aortic Atherosclerosis (ICD10-I70.0). Electronically Signed   By: Dahlia Bailiff M.D.   On: 10/07/2021 19:07    MR BRAIN W WO CONTRAST   Result Date: 10/06/2021 CLINICAL DATA:  Brain mass or lesion EXAM: MRI HEAD WITHOUT AND WITH CONTRAST TECHNIQUE: Multiplanar, multiecho pulse sequences of the brain and surrounding structures were obtained without and with intravenous contrast. CONTRAST:  8.51m GADAVIST GADOBUTROL 1 MMOL/ML IV SOLN COMPARISON:  No prior MRI, correlation is made with same day CT brain FINDINGS: Brain: Multiple peripherally  enhancing intracranial lesions, the largest of which is a 2.2 x 1.5 x 2.3 cm (AP x TR x CC) mass along the right frontal falx and is favored to extra-axial (series 16, image 138 and series 19, image 16). Significant surrounding T2 hyperintense signal, likely edema, which extends into the anterior and posterior right frontal lobe. Additional peripherally enhancing intraparenchymal lesions are also  noted in the anterior left frontal lobe and posterior left parietal lobe. The left frontal lobe lesion measures 1.1 x 0.7 x 0.9 cm (series 16, image 121 and series 19, image 10). The left parietal lesion measures 0.8 x 0.7 x 0.6 cm (series 16, image 124 and series 19, image 26). There is significant edema associated with the left frontal but not the left parietal lesion. No acute infarct, hemorrhage, hydrocephalus, extra-axial collection, or midline shift. Vascular: Normal flow voids. Skull and upper cervical spine: Intrinsically T1 hyperintense lesion in the clivus (series 16, image 31), indeterminate for enhancement. Otherwise normal marrow signal. Sinuses/Orbits: Mucosal thickening in the ethmoid air cells. Otherwise negative. Other: The mastoids are well aerated. IMPRESSION: Multiple peripherally enhancing intracranial lesions, the largest of which appears to be dural based, along the right aspect of the falx, with 2 additional peripherally enhancing lesions in the left frontal and parietal lobes. These are overall concerning for metastatic disease. Electronically Signed   By: Merilyn Baba M.D.   On: 10/06/2021 23:07    CT ABDOMEN PELVIS W CONTRAST   Result Date: 10/07/2021 CLINICAL DATA:  Metastatic disease evaluation. EXAM: CT CHEST, ABDOMEN, AND PELVIS WITH CONTRAST TECHNIQUE: Multidetector CT imaging of the chest, abdomen and pelvis was performed following the standard protocol during bolus administration of intravenous contrast. CONTRAST:  60m OMNIPAQUE IOHEXOL 350 MG/ML SOLN COMPARISON:  No relevant priors  available at time dictation. FINDINGS: CT CHEST FINDINGS Cardiovascular: Aortic and branch vessel atherosclerosis without aneurysmal dilation. No central pulmonary embolus on this nondedicated study. Normal size heart. No significant pericardial effusion/thickening. Mediastinum/Nodes: Supraclavicular adenopathy for instance a 2.2 cm left supraclavicular lymph node on image 4/2. No discrete thyroid nodule. Mediastinal and right hilar adenopathy. For reference 2.2 cm a low right paratracheal lymph node on image 22/2 and a 1.8 cm right hilar lymph node on image 28/2. No pathologically enlarged axillary lymph nodes. Surgical clips in the right axilla. Lungs/Pleura: Right hilar mass which narrows the right middle lobe pulmonary artery and bronchus with obstruction of the lateral segment bronchus of the right middle lobe. The mass is difficult to accurately measure given the associated postobstructive atelectasis, but measures approximately 5.6 x 4.0 cm on image 36/2. There are multiple ground-glass nodular opacities some of which demonstrate a central solid component for instance a 1.8 cm ground-glass opacity on image 53/4, a 1.1 cm nodular opacity on image 45/4 and a 0.9 cm nodular opacity image 48/4. No suspicious left-sided pulmonary nodules or masses. No pleural effusion. No pneumothorax. Apical predominant paraseptal emphysematous change. Musculoskeletal: No aggressive lytic or blastic lesion bone. Multilevel degenerative changes spine. CT ABDOMEN PELVIS FINDINGS Hepatobiliary: No suspicious hepatic lesion. Gallbladder is unremarkable. No biliary ductal dilation. Pancreas: No pancreatic ductal dilation or evidence of acute inflammation. Spleen: Within normal limits. Adrenals/Urinary Tract: Bilateral adrenal glands are unremarkable. No hydronephrosis. Left cortical renal scarring. No solid enhancing renal lesion. Mild wall thickening of an incompletely distended urinary bladder. Stomach/Bowel: Stomach is unremarkable  for degree of distension. No pathologic dilation of small or large bowel. The appendix and terminal ileum appear normal. Colon is predominately decompressed limiting evaluation. Vascular/Lymphatic: Aortic and branch vessel atherosclerosis without abdominal aortic aneurysm. No pathologically enlarged abdominal or pelvic lymph nodes. Reproductive: Dystrophic prostatic calcifications. Other: No significant abdominopelvic ascites. Musculoskeletal: Multilevel degenerative changes spine. 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 IMPRESSION: 1. Right hilar mass which narrows the right middle lobe pulmonary artery and bronchus with  obstruction of the lateral segment bronchus of the right middle lobe. The mass is difficult to accurately measure given the associated postobstructive atelectasis, but measures approximately 5.6 x 4.0 cm. 2. Supraclavicular, mediastinal and right hilar adenopathy. 3. Multiple ground-glass nodule in opacities some of which demonstrate a central solid component. Findings which are nonspecific and may be infectious and inflammatory versus reflecting metastatic disease involvement. Suggest attention on short-term interval follow-up study in 3 months. 4. No evidence of metastatic disease within the abdomen or pelvis. 5. Mild wall thickening of an incompletely distended urinary bladder. Correlate with urinalysis to exclude cystitis. 6. 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 7. Aortic Atherosclerosis (ICD10-I70.0). Electronically Signed   By: Dahlia Bailiff M.D.   On: 10/07/2021 19:07    US Venous Img Lower Unilateral Left   Result Date: 10/06/2021 CLINICAL DATA:  Pain. EXAM: Left LOWER EXTREMITY VENOUS DOPPLER ULTRASOUND TECHNIQUE: Gray-scale sonography with compression, as well as color and duplex ultrasound, were performed to evaluate the deep venous system(s) from the level  of the common femoral vein through the popliteal and proximal calf veins. COMPARISON:  None. FINDINGS: VENOUS Normal compressibility of the common femoral, superficial femoral, and popliteal veins, as well as the visualized calf veins. Visualized portions of profunda femoral vein and great saphenous vein unremarkable. No filling defects to suggest DVT on grayscale or color Doppler imaging. Doppler waveforms show normal direction of venous flow, normal respiratory plasticity and response to augmentation. Limited views of the contralateral common femoral vein are unremarkable. OTHER Slow flow noted within the left popliteal vein of uncertain etiology. Limitations: none IMPRESSION: No evidence for deep venous thrombosis in the left lower extremity. Electronically Signed   By: Ronney Asters M.D.   On: 10/06/2021 20:09    DG Chest Portable 1 View   Result Date: 10/06/2021 CLINICAL DATA:  Pain EXAM: PORTABLE CHEST 1 VIEW COMPARISON:  08/11/2021 FINDINGS: Persistent right middle lobe opacity no pleural effusion. Stable cardiomediastinal silhouette. IMPRESSION: Persistent right middle lobe opacity, given lack of interval clearing, recommend CT chest for further evaluation. Electronically Signed   By: Donavan Foil M.D.   On: 10/06/2021 19:08        Imaging Results  CT Head Wo Contrast   Result Date: 10/06/2021 CLINICAL DATA:  Neuro deficit, acute, stroke suspected. Left sided weakness. History of melanoma. EXAM: CT HEAD WITHOUT CONTRAST TECHNIQUE: Contiguous axial images were obtained from the base of the skull through the vertex without intravenous contrast. COMPARISON:  07/28/2011 head MRI report FINDINGS: Brain: There is extensive vasogenic edema in the right frontal lobe with milder edema anteriorly in the left frontal lobe. There is a suspected 2 cm mass in the parasagittal right frontal lobe. Edema results in mild mass effect on the frontal horn of the right lateral ventricle. No intracranial hemorrhage,  definite acute cortically based infarct, significant midline shift, or extra-axial fluid collection is identified. Vascular: Calcified atherosclerosis at the skull base. No hyperdense vessel. Skull: No fracture or suspicious osseous lesion. Sinuses/Orbits: Mild mucosal thickening in the paranasal sinuses. Clear mastoid air cells. Unremarkable orbits. Other: None. IMPRESSION: Vasogenic edema in the frontal lobes, extensive on the right. This is most concerning for metastatic disease, and a brain MRI without and with contrast is recommended for further evaluation. Electronically Signed   By: Logan Bores M.D.   On: 10/06/2021 18:59    CT CHEST W CONTRAST   Result Date: 10/07/2021 CLINICAL DATA:  Metastatic disease evaluation. EXAM: CT CHEST, ABDOMEN, AND PELVIS WITH CONTRAST TECHNIQUE: Multidetector CT imaging of the chest, abdomen and pelvis was performed following the standard protocol during bolus administration of intravenous contrast. CONTRAST:  76m OMNIPAQUE IOHEXOL 350 MG/ML SOLN COMPARISON:  No relevant priors available at time dictation. FINDINGS: CT CHEST FINDINGS Cardiovascular: Aortic and branch vessel atherosclerosis without aneurysmal dilation. No central pulmonary embolus on this nondedicated study. Normal size heart. No significant pericardial effusion/thickening. Mediastinum/Nodes: Supraclavicular adenopathy for instance a 2.2 cm left supraclavicular lymph node on image 4/2. No discrete thyroid nodule. Mediastinal and right hilar adenopathy. For reference 2.2 cm a low right paratracheal lymph node on image 22/2 and a 1.8 cm right hilar lymph node on image 28/2. No pathologically enlarged axillary lymph nodes. Surgical clips in the right axilla. Lungs/Pleura: Right hilar mass which narrows the right middle lobe pulmonary artery and bronchus with obstruction of the lateral segment bronchus of the right middle lobe. The mass is difficult to accurately measure given the associated postobstructive  atelectasis, but measures approximately 5.6 x 4.0 cm on image 36/2. There are multiple ground-glass nodular opacities some of which demonstrate a central solid component for instance a 1.8 cm ground-glass opacity on image 53/4, a 1.1 cm nodular opacity on image 45/4 and a 0.9 cm nodular opacity image 48/4. No suspicious left-sided pulmonary nodules or masses. No pleural effusion. No pneumothorax. Apical predominant paraseptal emphysematous change. Musculoskeletal: No aggressive lytic or blastic lesion bone. Multilevel degenerative changes spine. CT ABDOMEN PELVIS FINDINGS Hepatobiliary: No suspicious hepatic lesion. Gallbladder is unremarkable. No biliary ductal dilation. Pancreas: No pancreatic ductal dilation or evidence of acute inflammation. Spleen: Within normal limits. Adrenals/Urinary Tract: Bilateral adrenal glands are unremarkable. No hydronephrosis. Left cortical renal scarring. No solid enhancing renal lesion. Mild wall thickening of an incompletely distended urinary bladder. Stomach/Bowel: Stomach is unremarkable for degree of distension. No pathologic dilation of small or large bowel. The appendix and terminal ileum appear normal. Colon is predominately decompressed limiting evaluation. Vascular/Lymphatic: Aortic and branch vessel atherosclerosis without abdominal aortic aneurysm. No pathologically enlarged abdominal or pelvic lymph nodes. Reproductive: Dystrophic prostatic calcifications. Other: No significant abdominopelvic ascites. Musculoskeletal: Multilevel degenerative changes spine. 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 IMPRESSION: 1. Right hilar mass which narrows the right middle lobe pulmonary artery and bronchus with obstruction of the lateral segment bronchus of the right middle lobe. The mass is difficult to accurately measure given the associated postobstructive atelectasis, but measures approximately 5.6 x 4.0 cm. 2.  Supraclavicular, mediastinal and right hilar adenopathy. 3. Multiple ground-glass nodule in opacities some of which demonstrate a central solid component. Findings which are nonspecific and may be infectious and inflammatory versus reflecting metastatic disease involvement. Suggest attention on short-term interval follow-up study in 3 months. 4. No evidence of metastatic disease within the abdomen or pelvis. 5. Mild wall thickening of an incompletely distended urinary bladder. Correlate with urinalysis to exclude cystitis. 6. 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 7. Aortic Atherosclerosis (ICD10-I70.0). Electronically Signed   By: JDahlia BailiffM.D.   On: 10/07/2021 19:07    MR BRAIN W WO CONTRAST   Result Date: 10/06/2021 CLINICAL DATA:  Brain mass or lesion EXAM: MRI HEAD WITHOUT AND WITH CONTRAST TECHNIQUE: Multiplanar, multiecho pulse sequences of the brain and surrounding structures were obtained without and with intravenous contrast. CONTRAST:  8.537mGADAVIST GADOBUTROL 1 MMOL/ML IV SOLN COMPARISON:  No prior MRI, correlation is made with same day CT brain FINDINGS: Brain: Multiple peripherally enhancing intracranial lesions, the largest of which is a 2.2 x 1.5 x 2.3 cm (AP x TR x CC) mass along the right frontal falx and is favored to extra-axial (series 16, image 138 and series 19, image 16). Significant surrounding T2 hyperintense signal, likely edema, which extends into the anterior and posterior right frontal lobe. Additional peripherally enhancing intraparenchymal lesions are also noted in the anterior left frontal lobe and posterior left parietal lobe. The left frontal lobe lesion measures 1.1 x 0.7 x 0.9 cm (series 16, image 121 and series 19, image 10). The left parietal lesion measures 0.8 x 0.7 x 0.6 cm (series 16, image 124 and series 19, image 26). There is significant edema associated with the left frontal but not the left  parietal lesion. No acute infarct, hemorrhage, hydrocephalus, extra-axial collection, or midline shift. Vascular: Normal flow voids. Skull and upper cervical spine: Intrinsically T1 hyperintense lesion in the clivus (series 16, image 31), indeterminate for enhancement. Otherwise normal marrow signal. Sinuses/Orbits: Mucosal thickening in the ethmoid air cells. Otherwise negative. Other: The mastoids are well aerated. IMPRESSION: Multiple peripherally enhancing intracranial lesions, the largest of which appears to be dural based, along the right aspect of the falx, with 2 additional peripherally enhancing lesions in the left frontal and parietal lobes. These are overall concerning for metastatic disease. Electronically Signed   By: Merilyn Baba M.D.   On: 10/06/2021 23:07    CT ABDOMEN PELVIS W CONTRAST   Result Date: 10/07/2021 CLINICAL DATA:  Metastatic disease evaluation. EXAM: CT CHEST, ABDOMEN, AND PELVIS WITH CONTRAST TECHNIQUE: Multidetector CT imaging of the chest, abdomen and pelvis was performed following the standard protocol during bolus administration of intravenous contrast. CONTRAST:  50m OMNIPAQUE IOHEXOL 350 MG/ML SOLN COMPARISON:  No relevant priors available at time dictation. FINDINGS: CT CHEST FINDINGS Cardiovascular: Aortic and branch vessel atherosclerosis without aneurysmal dilation. No central pulmonary embolus on this nondedicated study. Normal size heart. No significant pericardial effusion/thickening. Mediastinum/Nodes: Supraclavicular adenopathy for instance a 2.2 cm left supraclavicular lymph node on image 4/2. No discrete thyroid nodule. Mediastinal and right hilar adenopathy. For reference 2.2 cm a low right paratracheal lymph node on image 22/2 and a 1.8 cm right hilar lymph node on image 28/2. No pathologically enlarged axillary lymph nodes. Surgical clips in the right axilla. Lungs/Pleura: Right hilar mass which narrows the right middle lobe pulmonary artery and bronchus with  obstruction of the lateral segment bronchus of the right middle lobe. The mass is difficult to accurately measure given the associated postobstructive atelectasis, but measures approximately 5.6 x 4.0 cm on image 36/2. There are multiple ground-glass nodular opacities some of which demonstrate a central solid component for instance a 1.8 cm ground-glass opacity on image 53/4, a 1.1 cm nodular opacity on image 45/4 and a 0.9 cm nodular opacity image 48/4. No suspicious left-sided pulmonary nodules or masses. No pleural effusion. No pneumothorax. Apical predominant paraseptal emphysematous change. Musculoskeletal: No aggressive lytic or blastic lesion bone. Multilevel degenerative changes spine. CT ABDOMEN PELVIS FINDINGS Hepatobiliary: No suspicious hepatic lesion. Gallbladder is unremarkable. No biliary ductal dilation. Pancreas: No pancreatic ductal dilation or evidence of acute inflammation. Spleen: Within normal limits. Adrenals/Urinary Tract: Bilateral adrenal glands are unremarkable. No hydronephrosis. Left cortical renal scarring. No solid enhancing renal lesion. Mild wall thickening of an incompletely distended urinary bladder. Stomach/Bowel: Stomach is unremarkable for degree of distension. No pathologic dilation of small or  large bowel. The appendix and terminal ileum appear normal. Colon is predominately decompressed limiting evaluation. Vascular/Lymphatic: Aortic and branch vessel atherosclerosis without abdominal aortic aneurysm. No pathologically enlarged abdominal or pelvic lymph nodes. Reproductive: Dystrophic prostatic calcifications. Other: No significant abdominopelvic ascites. Musculoskeletal: Multilevel degenerative changes spine. 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 IMPRESSION: 1. Right hilar mass which narrows the right middle lobe pulmonary artery and bronchus with obstruction of the lateral segment bronchus of the right middle  lobe. The mass is difficult to accurately measure given the associated postobstructive atelectasis, but measures approximately 5.6 x 4.0 cm. 2. Supraclavicular, mediastinal and right hilar adenopathy. 3. Multiple ground-glass nodule in opacities some of which demonstrate a central solid component. Findings which are nonspecific and may be infectious and inflammatory versus reflecting metastatic disease involvement. Suggest attention on short-term interval follow-up study in 3 months. 4. No evidence of metastatic disease within the abdomen or pelvis. 5. Mild wall thickening of an incompletely distended urinary bladder. Correlate with urinalysis to exclude cystitis. 6. 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 7. Aortic Atherosclerosis (ICD10-I70.0). Electronically Signed   By: Dahlia Bailiff M.D.   On: 10/07/2021 19:07    US Venous Img Lower Unilateral Left   Result Date: 10/06/2021 CLINICAL DATA:  Pain. EXAM: Left LOWER EXTREMITY VENOUS DOPPLER ULTRASOUND TECHNIQUE: Gray-scale sonography with compression, as well as color and duplex ultrasound, were performed to evaluate the deep venous system(s) from the level of the common femoral vein through the popliteal and proximal calf veins. COMPARISON:  None. FINDINGS: VENOUS Normal compressibility of the common femoral, superficial femoral, and popliteal veins, as well as the visualized calf veins. Visualized portions of profunda femoral vein and great saphenous vein unremarkable. No filling defects to suggest DVT on grayscale or color Doppler imaging. Doppler waveforms show normal direction of venous flow, normal respiratory plasticity and response to augmentation. Limited views of the contralateral common femoral vein are unremarkable. OTHER Slow flow noted within the left popliteal vein of uncertain etiology. Limitations: none IMPRESSION: No evidence for deep venous thrombosis in the left lower extremity.  Electronically Signed   By: Ronney Asters M.D.   On: 10/06/2021 20:09    DG Chest Portable 1 View   Result Date: 10/06/2021 CLINICAL DATA:  Pain EXAM: PORTABLE CHEST 1 VIEW COMPARISON:  08/11/2021 FINDINGS: Persistent right middle lobe opacity no pleural effusion. Stable cardiomediastinal silhouette. IMPRESSION: Persistent right middle lobe opacity, given lack of interval clearing, recommend CT chest for further evaluation. Electronically Signed   By: Donavan Foil M.D.   On: 10/06/2021 19:08       Assessment and Plan:   This is a 60 year old male with   1) stage IV non-small cell lung cancer -10/06/2021 the MRI of the brain with and without contrast- "Multiple peripherally enhancing intracranial lesions, the largest of which appears to be dural based, along the right aspect of the falx, with 2 additional peripherally enhancing lesions in the left frontal and parietal lobes. These are overall concerning for metastatic disease." -10/07/2021 CT chest/abdomen/pelvis with contrast- "1. Right hilar mass which narrows the right middle lobe pulmonary artery and bronchus with obstruction of the lateral segment bronchus of the right middle lobe. The mass is difficult to accurately measure given the associated postobstructive atelectasis, but measures approximately 5.6 x 4.0 cm. 2. Supraclavicular, mediastinal and right hilar adenopathy. 3. Multiple ground-glass nodule in opacities some of  which demonstrate a central solid component. Findings which are nonspecific and may be infectious and inflammatory versus reflecting metastatic disease involvement. Suggest attention on short-term interval follow-up study in 3 months. 4. No evidence of metastatic disease within the abdomen or pelvis. 5. Mild wall thickening of an incompletely distended urinary bladder. Correlate with urinalysis to exclude cystitis. 6. 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 7.  Aortic Atherosclerosis (ICD10-I70.0)."   2) history of melanoma, stage III -Diagnosed June 2012 -Right axillary excisional biopsy in July 2012 consistent with melanoma -Status post right axillary lymph node dissection, 0/17 nodes negative for malignancy -Received high-dose interferon in the ECOG 1609 clinical trial on 10/18/2011 -No evidence of recurrent or metastatic disease on imaging per notes from [place] with last scan performed on 10/23/2018   PLAN: -Discussed CT scan results, MRI of the brain results, as well as preliminary biopsy results with the patient and his family.  Biopsy results consistent with non-small cell lung cancer.   -called pathology to order PDL1 and foundation one testing for lung adenocarcinoma. Radiation oncology has been consulted and shall be arranging for neuronavigational MRI and consideration of palliative RT to brain lesions- scheduled for 10/23/2021 -steroid taper per radiation oncology. -Discussed possible systemic treatment options once radiation is completed  Follow-up  Return to clinic with Dr. Irene Limbo with labs in 2 weeks  Sullivan Lone MD MS

## 2021-10-21 ENCOUNTER — Telehealth: Payer: Self-pay | Admitting: Hematology

## 2021-10-21 ENCOUNTER — Encounter: Payer: Self-pay | Admitting: *Deleted

## 2021-10-21 DIAGNOSIS — C7931 Secondary malignant neoplasm of brain: Secondary | ICD-10-CM | POA: Diagnosis present

## 2021-10-21 NOTE — Telephone Encounter (Signed)
Scheduled follow-up appointment per 11/1 los. Patient is aware.

## 2021-10-21 NOTE — Progress Notes (Signed)
I notified pathology team of Dr. Grier Mitts request for molecular and PDL 1 to be sent to [facility].

## 2021-10-22 ENCOUNTER — Encounter: Payer: Self-pay | Admitting: General Practice

## 2021-10-22 NOTE — Progress Notes (Signed)
[facility] CSW Progress Notes  Call from wife, she has questions about when to file for disability as patient has a new diagnosis.  He recently took early retirement from his former employer and is covered under his [employer].  He has been employed throughout his life.  Advised speaking with lawyer or [place] directly, provided brief overview of process of filing.  Advised them to contact Triage Cancer as they provide additional information on insurance and financial aspects of a cancer diagnosis.  Will send information on Support Center via email to wife and patient.This will have my contact information so they can contact us as needed for support/resources.   Edwyna Shell, LCSW Clinical Social Worker Phone:  [phone]

## 2021-10-23 ENCOUNTER — Encounter: Payer: Self-pay | Admitting: Radiation Oncology

## 2021-10-23 ENCOUNTER — Ambulatory Visit: Payer: Self-pay | Admitting: Neurosurgery

## 2021-10-23 ENCOUNTER — Ambulatory Visit
Admission: RE | Admit: 2021-10-23 | Discharge: 2021-10-23 | Disposition: A | Discharge status: Home / Self Care | Payer: BC Managed Care – PPO | Source: Ambulatory Visit | Attending: Radiation Oncology | Admitting: Radiation Oncology

## 2021-10-23 ENCOUNTER — Other Ambulatory Visit: Payer: Self-pay

## 2021-10-23 VITALS — BP 114/83 | HR 75 | Temp 97.7°F | Resp 20

## 2021-10-23 DIAGNOSIS — C7931 Secondary malignant neoplasm of brain: Secondary | ICD-10-CM

## 2021-10-23 NOTE — Progress Notes (Signed)
Nurse monitoring complete status post 1 of 1 SRS treatments. Patient rested with [place] for 30 minutes. Patient without complaints. Patient denies new or worsening neurologic symptoms. Vitals stable. Instructed patient to avoid strenuous activity for the next 24 hours. Instructed patient to not miss any of her decadron doses (provided copy of Dr. Pearlie Oyster taper instructions in AVS). Instructed patient to call [phone] with needs related to treatment after hours or over the weekend. Patient and wife verbalized understanding. Patient ambulated out of clinic unassisted to personal vehicle without incident  Vitals:   10/23/21 1355  BP: 114/83  Pulse: 75  Resp: 20  Temp: 97.7 F (36.5 C)  SpO2: 99%

## 2021-10-23 NOTE — Patient Instructions (Signed)
TAPER PLAN:  Take 1 tablet 3x day through Nov 11 Then take 1 tablet 2x day through Nov 25 Then take 1 tablet daily through Dec 9 Then take 1/2 tablet daily through Dec 16 Then take 1/2 every other day through Dec 23.

## 2021-10-23 NOTE — Progress Notes (Signed)
  Radiation Oncology         [phone] ________________________________  Name: Christopher Randall MRN: [id number]  Date: 10/23/2021  DOB: 1961/03/12  Stereotactic Treatment Procedure Note  SPECIAL TREATMENT PROCEDURE  Outpatient    ICD-10-CM   1. Brain metastasis ([place])  C79.31     2. Brain metastases ([place])  C79.31       3D TREATMENT PLANNING AND DOSIMETRY:  The patient's radiation plan was reviewed and approved by neurosurgery and radiation oncology prior to treatment.  It showed 3-dimensional radiation distributions overlaid onto the planning CT/MRI image set.  The [facility] for the target structures as well as the organs at risk were reviewed. The documentation of the 3D plan and dosimetry are filed in the radiation oncology EMR.  NARRATIVE:  Christopher Randall was brought to the TrueBeam stereotactic radiation treatment machine and placed supine on the CT couch. The head frame was applied, and the patient was set up for stereotactic radiosurgery.  Neurosurgery was present for the set-up and delivery  SIMULATION VERIFICATION:  In the couch zero-angle position, the patient underwent Exactrac imaging using the Brainlab system with orthogonal KV images.  These were carefully aligned and repeated to confirm treatment position for each of the isocenters. The Exactrac snap film verification was repeated at each couch angle.  SPECIAL TREATMENT PROCEDURE: [name] received stereotactic radiosurgery to the following targets: SRS technique / IMRT; 6MV FFF photons, ExacTrac Snap verification was performed for each couch angle.      This constitutes a special treatment procedure due to the ablative dose delivered and the technical nature of treatment.  This highly technical modality of treatment ensures that the ablative dose is centered on the patient's tumor while sparing normal tissues from excessive dose and risk of detrimental effects.  STEREOTACTIC TREATMENT MANAGEMENT:  Following  delivery, the patient was transported to nursing in stable condition and monitored for possible acute effects.  Vital signs were recorded There were no vitals taken for this visit.. The patient tolerated treatment without significant acute effects, and was discharged to home in stable condition.    PLAN: Follow-up in one month.  ________________________________   Eppie Gibson, MD

## 2021-10-23 NOTE — Op Note (Signed)
  Name: Christopher Randall  MRN: [id number]  Date: 10/23/2021   DOB: 1961/02/04  Stereotactic Radiosurgery Operative Note  PRE-OPERATIVE DIAGNOSIS:  Multiple Brain Metastases  POST-OPERATIVE DIAGNOSIS:  Multiple Brain Metastases  PROCEDURE:  Stereotactic Radiosurgery  SURGEON:  Charlie Pitter, MD  NARRATIVE: The patient underwent a radiation treatment planning session in the radiation oncology simulation suite under the care of the radiation oncology physician and physicist.  I participated closely in the radiation treatment planning afterwards. The patient underwent planning CT which was fused to 3T high resolution MRI with 1 mm axial slices.  These images were fused on the planning system.  We contoured the gross target volumes and subsequently expanded this to yield the Planning Target Volume. I actively participated in the planning process.  I helped to define and review the target contours and also the contours of the optic pathway, eyes, brainstem and selected nearby organs at risk.  All the dose constraints for critical structures were reviewed and compared to AAPM Task Group 101.  The prescription dose conformity was reviewed.  I approved the plan electronically.    Accordingly, Christopher Randall was brought to the TrueBeam stereotactic radiation treatment linac and placed in the custom immobilization mask.  The patient was aligned according to the IR fiducial markers with BrainLab Exactrac, then orthogonal x-rays were used in ExacTrac with the 6DOF robotic table and the shifts were made to align the patient  Christopher Randall received stereotactic radiosurgery uneventfully.    Lesions treated:  3   Complex lesions treated:  0 (>3.5 cm, <58mm of optic path, or within the brainstem)   The detailed description of the procedure is recorded in the radiation oncology procedure note.  I was present for the duration of the procedure.  DISPOSITION:  Following delivery, the patient was transported  to nursing in stable condition and monitored for possible acute effects to be discharged to home in stable condition with follow-up in one month.  Charlie Pitter, MD 10/23/2021 1:59 PM

## 2021-10-23 NOTE — Addendum Note (Signed)
Encounter addended by: Zola Button, RN on: 10/23/2021 2:33 PM  Actions taken: Flowsheet accepted, Clinical Note Signed

## 2021-10-23 NOTE — Addendum Note (Signed)
Encounter addended by: Earnie Larsson, MD on: 10/23/2021 2:00 PM  Actions taken: Clinical Note Signed

## 2021-10-27 ENCOUNTER — Ambulatory Visit: Payer: BC Managed Care – PPO | Admitting: Physician Assistant

## 2021-10-27 ENCOUNTER — Telehealth: Payer: Self-pay

## 2021-10-27 ENCOUNTER — Encounter: Payer: Self-pay | Admitting: Physician Assistant

## 2021-10-27 ENCOUNTER — Other Ambulatory Visit: Payer: Self-pay

## 2021-10-27 ENCOUNTER — Ambulatory Visit (INDEPENDENT_AMBULATORY_CARE_PROVIDER_SITE_OTHER)
Admission: RE | Admit: 2021-10-27 | Discharge: 2021-10-27 | Disposition: A | Discharge status: Home / Self Care | Payer: BC Managed Care – PPO | Source: Ambulatory Visit | Attending: Physician Assistant | Admitting: Physician Assistant

## 2021-10-27 VITALS — BP 117/77 | HR 71 | Temp 98.3°F | Ht 69.0 in | Wt 207.0 lb

## 2021-10-27 DIAGNOSIS — R058 Other specified cough: Secondary | ICD-10-CM

## 2021-10-27 DIAGNOSIS — R918 Other nonspecific abnormal finding of lung field: Secondary | ICD-10-CM

## 2021-10-27 DIAGNOSIS — J101 Influenza due to other identified influenza virus with other respiratory manifestations: Secondary | ICD-10-CM | POA: Diagnosis not present

## 2021-10-27 DIAGNOSIS — C7931 Secondary malignant neoplasm of brain: Secondary | ICD-10-CM

## 2021-10-27 DIAGNOSIS — U071 COVID-19: Secondary | ICD-10-CM

## 2021-10-27 DIAGNOSIS — C3490 Malignant neoplasm of unspecified part of unspecified bronchus or lung: Secondary | ICD-10-CM

## 2021-10-27 LAB — POCT INFLUENZA A/B
Influenza A, POC: POSITIVE — AB
Influenza B, POC: NEGATIVE

## 2021-10-27 LAB — POC COVID19 BINAXNOW: SARS Coronavirus 2 Ag: POSITIVE — AB

## 2021-10-27 IMAGING — DX DG CHEST 2V
2 series · 2 of 2 positions shown · non-contrast
Comparison: [DATE]

CLINICAL DATA: Productive cough. Congestion. Shortness of breath
for 1 day. History of right-sided lung cancer.

EXAM:
CHEST - 2 VIEW

[chest pa]
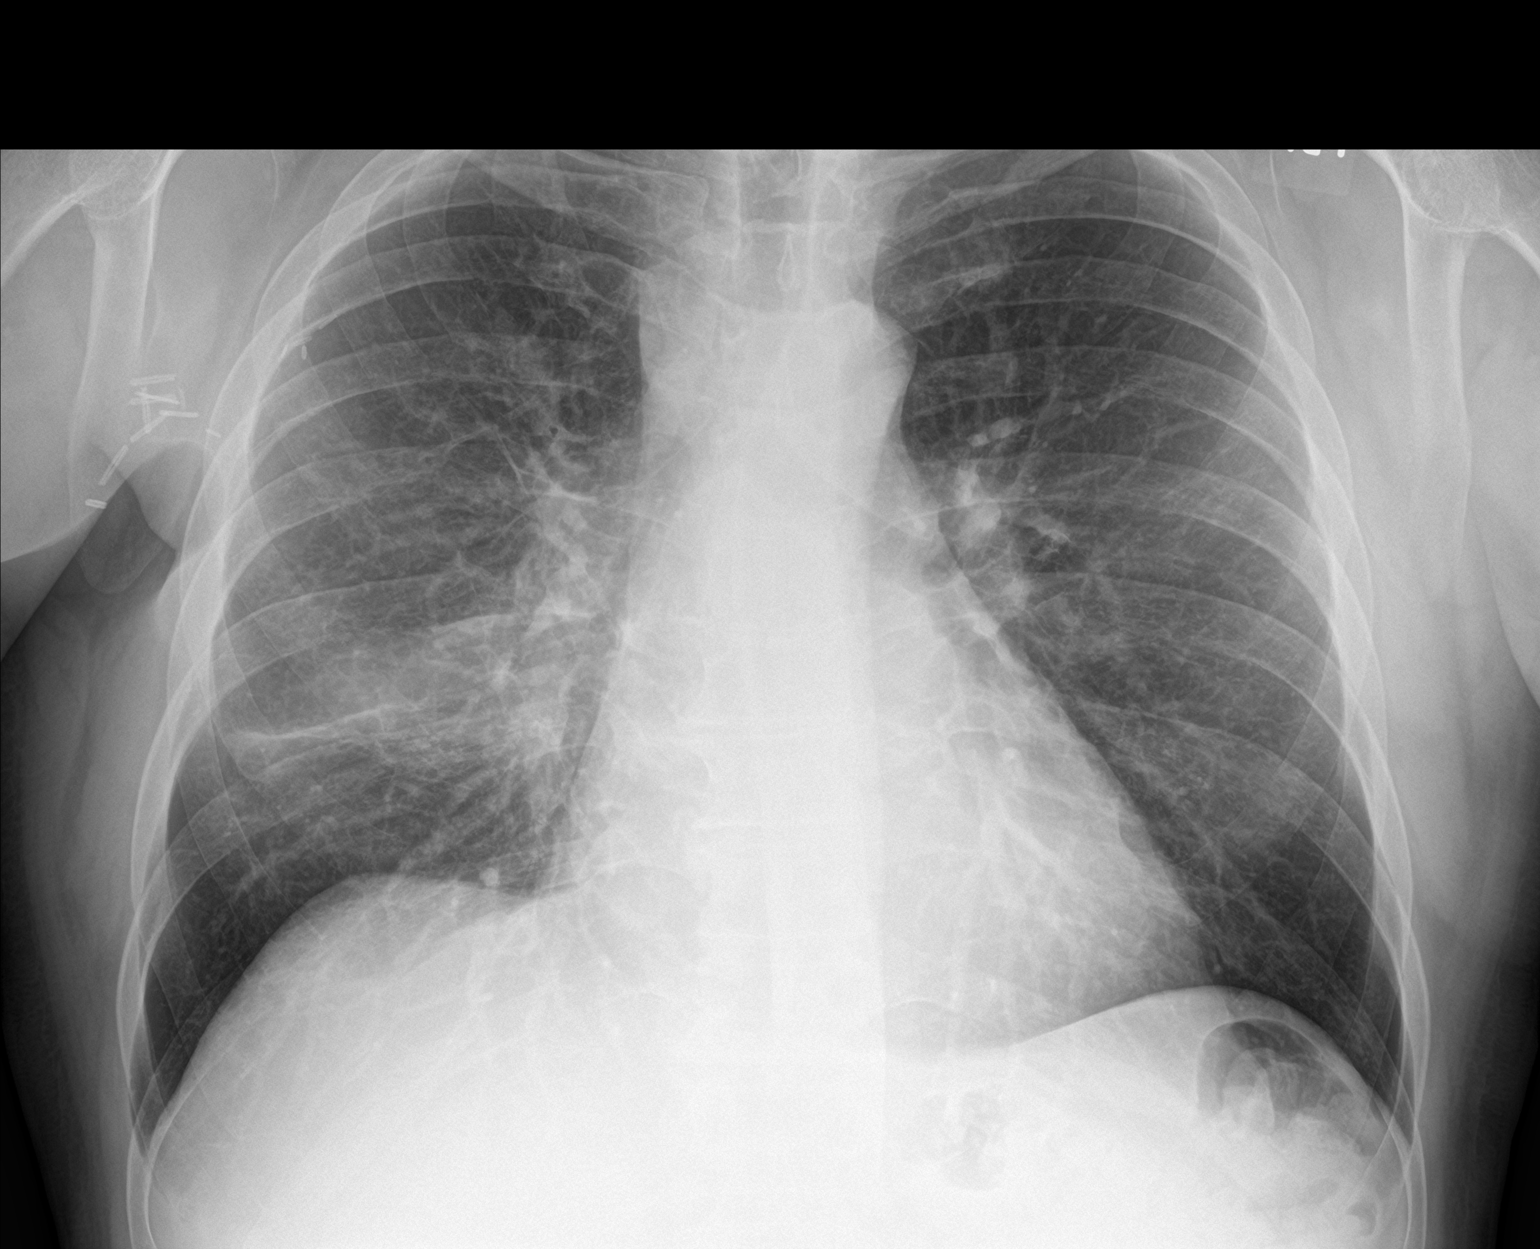

[chest lat]
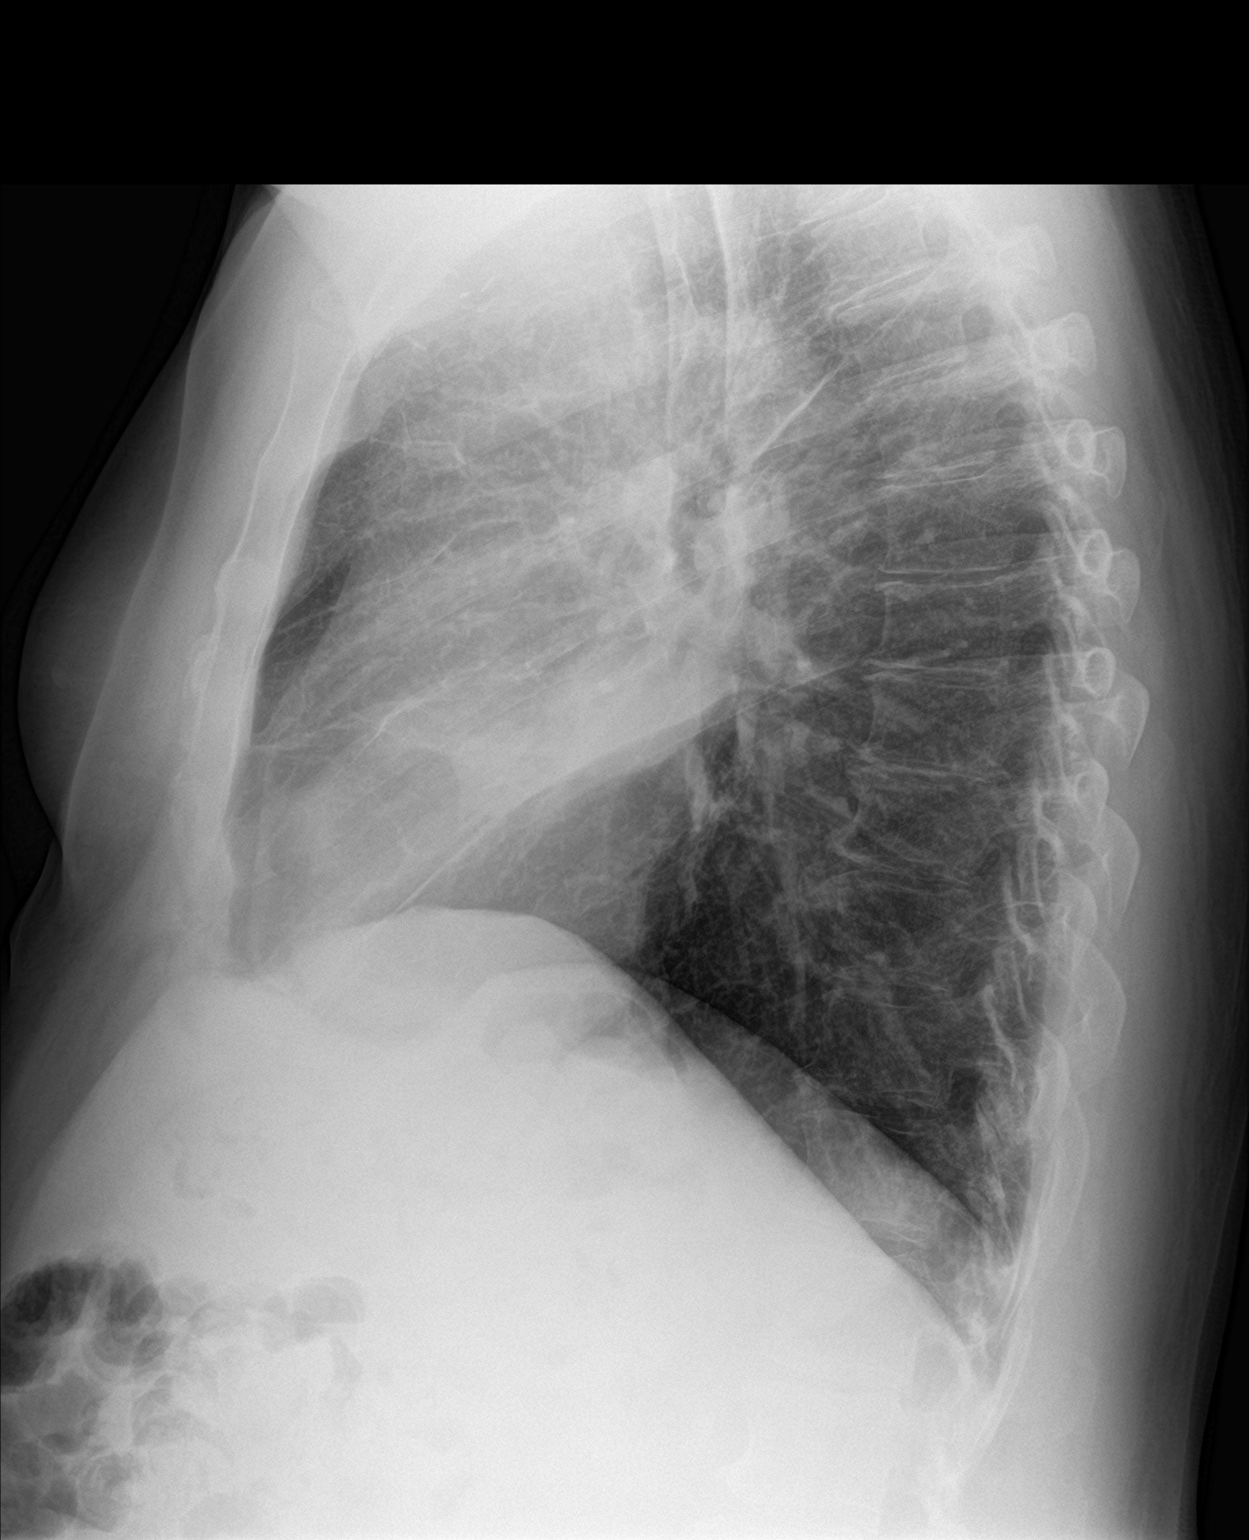

[2 of 2 positions shown; findings below may reference images not displayed]

FINDINGS: Midline trachea. Normal heart size. Right-sided mediastinal and
hilar adenopathy are grossly similar to on the prior CT. No pleural
effusion or pneumothorax. Right middle lobe opacification secondary
to mass and postobstructive pneumonitis, grossly similar to on the
prior CT. Clear left lung. Right axillary surgical clips.
IMPRESSION: Grossly similar right middle lobe/hilar mass with postobstructive
pneumonitis. Mediastinal and right hilar adenopathy, as on CT.

No acute superimposed process.

## 2021-10-27 MED ORDER — OSELTAMIVIR PHOSPHATE 75 MG PO CAPS: 75.0000 mg | ORAL_CAPSULE | Freq: Two times a day (BID) | ORAL | 0 refills | Status: DC

## 2021-10-27 MED ORDER — MOLNUPIRAVIR EUA 200MG CAPSULE: 4.0000 | ORAL_CAPSULE | Freq: Two times a day (BID) | ORAL | 0 refills | Status: AC

## 2021-10-27 NOTE — Telephone Encounter (Signed)
Patient has returned phone call in regard to x ray results.  I have read patient Christopher Randall's response in regard.  Patient states he understood.  However, patient states there were two scripts sent to his pharmacy today.  He is requesting a call back to review.

## 2021-10-27 NOTE — Patient Instructions (Addendum)
Thanks for coming in today.  You have tested positive for influenza A.  I have sent Tamiflu to your pharmacy.  Please take these as directed.  Push fluids and rest.  Very low threshold to go to the emergency department if acutely worsening symptoms.  Because of your immunocompromise status, I do think we need to repeat a chest x-ray today as well.  Please have this done at [facility].  See you back for recheck in about 8 weeks.  Please do not hesitate to call back any sooner if you have concerns. I will be keeping you in my prayers as you continue on this treatment journey.

## 2021-10-27 NOTE — Progress Notes (Signed)
Subjective:    Patient ID: Christopher Randall, male    DOB: 03-24-1961, 60 y.o.   MRN: [id number]  Chief Complaint  Patient presents with   Transitions Of Care   Cough    HPI Patient is in today for TOC from Dr. Rogers Blocker. He is here with his wife today.  Recent diagnosis of lung cancer metastatic to brain. Currently tapering off of dexamethasone, which has been making him hungry and swollen, gaining weight. He is following up with Dr. Irene Limbo in oncology for plan moving forward.   Acute concerns: -Congested cough with productive yellow sputum in the last 24 hours, which has caused pain across both sides of his ribs. Daughter recently had acute bronchitis.  -No body aches, no fever or chills. Some runny nose and nasal congestion. Feeling a little weaker than normal. He is having some SOB.   Past Medical History:  Diagnosis Date   Abnormal TSH 09/28/2017   Anxiety 07/03/2012   Depression    Difficulty sleeping 09/28/2017   Examination of participant in clinical trial 09/11/2012   History of immunotherapy 02/23/2016   Melanoma of skin 10/26/2011   Nicotine dependence 07/17/2015   Smoker     Past Surgical History:  Procedure Laterality Date   BRONCHIAL BIOPSY  10/09/2021   Procedure: BRONCHIAL BIOPSIES;  Surgeon: [name], MD;  Location: [facility];  Service: Endoscopy;;   BRONCHIAL BRUSHINGS  10/09/2021   Procedure: BRONCHIAL BRUSHINGS;  Surgeon: [name], MD;  Location: [facility];  Service: Endoscopy;;   ENDOBRONCHIAL ULTRASOUND N/A 10/09/2021   Procedure: ENDOBRONCHIAL ULTRASOUND;  Surgeon: [name], MD;  Location: [facility];  Service: Endoscopy;  Laterality: N/A;   FINE NEEDLE ASPIRATION  10/09/2021   Procedure: FINE NEEDLE ASPIRATION (FNA) LINEAR;  Surgeon: [name], MD;  Location: [facility];  Service: Endoscopy;;   LEFT POPLITEAL MASS RESECTION  08/27/2011   pigmented villonodular synovitis, negative for malignancy   LYMPH  GLAND EXCISION  07/12/2011   right axilla, consistent with melanoma   LYMPH NODE DISSECTION  08/27/2011   right, 0/17 nodes positive   VIDEO BRONCHOSCOPY N/A 10/09/2021   Procedure: VIDEO BRONCHOSCOPY WITHOUT FLUORO;  Surgeon: [name], MD;  Location: [facility];  Service: Endoscopy;  Laterality: N/A;    Family History  Problem Relation Age of Onset   Hypertension Mother    CAD Mother    Lung cancer Father     Social History   Tobacco Use   Smoking status: Former    Packs/day: 0.25    Types: Cigarettes   Smokeless tobacco: Never  Vaping Use   Vaping Use: Never used  Substance Use Topics   Alcohol use: Yes    Alcohol/week: 6.0 standard drinks    Types: 6 Shots of liquor per week   Drug use: No     Allergies  Allergen Reactions   Iopamidol Hives and Itching    Isovue break through with prep, Hives and Itching  Patient had PET?CT scan with 125 mls of Isovue-300. Noted to have two raised itchy hives which self-resolved. Patient had PET/CT scan with contrast on 05/27/2014 while on prednisone prep (3 doses of 50 mg-13 hour prep) and noted to have two hives which self -resolved without treatment   Iodinated Diagnostic Agents Rash   Iodine-131 Rash    Review of Systems REFER TO HPI FOR PERTINENT POSITIVES AND NEGATIVES      Objective:     BP 117/77   Pulse  71   Temp 98.3 F (36.8 C)   Ht 5\' 9"  (1.753 m)   Wt 207 lb (93.9 kg)   SpO2 98%   BMI 30.57 kg/m   Wt Readings from Last 3 Encounters:  10/27/21 207 lb (93.9 kg)  10/20/21 203 lb 4.8 oz (92.2 kg)  10/16/21 193 lb (87.5 kg)    BP Readings from Last 3 Encounters:  10/27/21 117/77  10/23/21 114/83  10/20/21 139/87     Physical Exam Vitals and nursing note reviewed.  Constitutional:      Appearance: Normal appearance.  HENT:     Head: Normocephalic and atraumatic.     Right Ear: External ear normal.     Left Ear: External ear normal.     Nose: Nose normal.     Mouth/Throat:      Comments: HOARSEN[place] Cardiovascular:     Rate and Rhythm: Normal rate and regular rhythm.     Pulses: Normal pulses.     Heart sounds: Normal heart sounds. No murmur heard. Pulmonary:     Effort: Pulmonary effort is normal.     Breath sounds: Normal breath sounds.  Skin:    General: Skin is warm and dry.  Neurological:     General: No focal deficit present.     Mental Status: He is alert and oriented to person, place, and time.  Psychiatric:        Mood and Affect: Mood normal.        Behavior: Behavior normal.       Assessment & Plan:   Problem List Items Addressed This Visit       Respiratory   Lung cancer metastatic to brain ([place]) - Primary   Relevant Medications   oseltamivir (TAMIFLU) 75 MG capsule   molnupiravir EUA (LAGEVRIO) 200 mg CAPS capsule   Other Relevant Orders   DG Chest 2 View (Completed)     Other   Mass of middle lobe of right lung   Relevant Orders   DG Chest 2 View (Completed)   Other Visit Diagnoses     Productive cough       Relevant Orders   POC COVID-19 BinaxNow (Completed)   POCT Influenza A/B (Completed)   DG Chest 2 View (Completed)   Influenza A       Relevant Medications   oseltamivir (TAMIFLU) 75 MG capsule   molnupiravir EUA (LAGEVRIO) 200 mg CAPS capsule   Other Relevant Orders   DG Chest 2 View (Completed)   COVID-19       Relevant Medications   oseltamivir (TAMIFLU) 75 MG capsule   molnupiravir EUA (LAGEVRIO) 200 mg CAPS capsule        Meds ordered this encounter  Medications   oseltamivir (TAMIFLU) 75 MG capsule    Sig: Take 1 capsule (75 mg total) by mouth 2 (two) times daily.    Dispense:  10 capsule    Refill:  0   molnupiravir EUA (LAGEVRIO) 200 mg CAPS capsule    Sig: Take 4 capsules (800 mg total) by mouth 2 (two) times daily for 5 days.    Dispense:  40 capsule    Refill:  0   PLAN: +Flu A and COIVD-19 in patient with recent lung cancer metastatic to brain diagnosis. Options discussed with patient. Will  start on both Tamiflu and Molnupiravir at this time. Possible side effects discussed. Will also plan on CXR to make sure no secondary pneumonia developing. VERY low threshold for ED if acutely worse.  He will keep me updated on progress. F/up with Dr. Irene Limbo for ongoing cancer treatment. F/up with me in 8 weeks.  This note was prepared with assistance of Systems analyst. Occasional wrong-word or sound-a-like substitutions may have occurred due to the inherent limitations of voice recognition software.  Time Spent: 39 minutes of total time was spent on the date of the encounter performing the following actions: chart review prior to seeing the patient, obtaining history, performing a medically necessary exam, counseling on the treatment plan, placing orders, and documenting in our EHR.    Charnee Turnipseed M Larkin Alfred, PA-C

## 2021-10-27 NOTE — Telephone Encounter (Signed)
Notified patient of lab results.Patient voices understanding.  

## 2021-10-28 ENCOUNTER — Telehealth: Payer: Self-pay | Admitting: Radiation Therapy

## 2021-10-28 NOTE — Telephone Encounter (Signed)
Called pt to check in after completion of SRS treatment. From a CNS standpoint he is doing well, but he has had worsening cough and congesting prompting a visit with his PCP yesterday, 11/8. Unfortunately he tested positive for both Covid and the Flu. Mr. Fohl has appointments with [facility] in social work and Futures trader, Dr. Irene Limbo next week. I sent an Epic message to make sure this does not need to be rescheduled for quarantine.   Mont Dutton R.T.(R)(T) Radiation Special Procedures Navigator

## 2021-10-30 NOTE — Progress Notes (Signed)
[facility] Testing requested via email from pathology.

## 2021-11-02 ENCOUNTER — Other Ambulatory Visit: Payer: Self-pay

## 2021-11-02 ENCOUNTER — Other Ambulatory Visit: Payer: BC Managed Care – PPO | Admitting: *Deleted

## 2021-11-02 ENCOUNTER — Encounter: Payer: Self-pay | Admitting: Physician Assistant

## 2021-11-02 DIAGNOSIS — C3490 Malignant neoplasm of unspecified part of unspecified bronchus or lung: Secondary | ICD-10-CM

## 2021-11-02 MED ORDER — DEXAMETHASONE 4 MG PO TABS: 4.0000 mg | ORAL_TABLET | Freq: Two times a day (BID) | ORAL | 0 refills | Status: DC

## 2021-11-03 ENCOUNTER — Other Ambulatory Visit: Payer: Self-pay

## 2021-11-03 ENCOUNTER — Inpatient Hospital Stay: Payer: BC Managed Care – PPO

## 2021-11-03 ENCOUNTER — Encounter (HOSPITAL_COMMUNITY): Payer: Self-pay | Admitting: Oncology

## 2021-11-03 ENCOUNTER — Encounter: Payer: Self-pay | Admitting: *Deleted

## 2021-11-03 ENCOUNTER — Inpatient Hospital Stay: Payer: BC Managed Care – PPO | Admitting: Hematology

## 2021-11-03 DIAGNOSIS — C342 Malignant neoplasm of middle lobe, bronchus or lung: Secondary | ICD-10-CM | POA: Diagnosis not present

## 2021-11-03 DIAGNOSIS — C7931 Secondary malignant neoplasm of brain: Secondary | ICD-10-CM

## 2021-11-03 DIAGNOSIS — C3490 Malignant neoplasm of unspecified part of unspecified bronchus or lung: Secondary | ICD-10-CM

## 2021-11-03 LAB — CBC WITH DIFFERENTIAL (CANCER CENTER ONLY)
Abs Immature Granulocytes: 0.96 10*3/uL — ABNORMAL HIGH (ref 0.00–0.07)
Basophils Absolute: 0.1 10*3/uL (ref 0.0–0.1)
Basophils Relative: 0 %
Eosinophils Absolute: 0 10*3/uL (ref 0.0–0.5)
Eosinophils Relative: 0 %
HCT: 39 % (ref 39.0–52.0)
Hemoglobin: 12.8 g/dL — ABNORMAL LOW (ref 13.0–17.0)
Immature Granulocytes: 6 %
Lymphocytes Relative: 13 %
Lymphs Abs: 2.1 10*3/uL (ref 0.7–4.0)
MCH: 31.1 pg (ref 26.0–34.0)
MCHC: 32.8 g/dL (ref 30.0–36.0)
MCV: 94.7 fL (ref 80.0–100.0)
Monocytes Absolute: 1.4 10*3/uL — ABNORMAL HIGH (ref 0.1–1.0)
Monocytes Relative: 9 %
Neutro Abs: 11.4 10*3/uL — ABNORMAL HIGH (ref 1.7–7.7)
Neutrophils Relative %: 72 %
Platelet Count: 203 10*3/uL (ref 150–400)
RBC: 4.12 MIL/uL — ABNORMAL LOW (ref 4.22–5.81)
RDW: 15.5 % (ref 11.5–15.5)
Smear Review: NORMAL
WBC Count: 16 10*3/uL — ABNORMAL HIGH (ref 4.0–10.5)
nRBC: 0 % (ref 0.0–0.2)

## 2021-11-03 LAB — CMP (CANCER CENTER ONLY)
ALT: 27 U/L (ref 0–44)
AST: 17 U/L (ref 15–41)
Albumin: 3.1 g/dL — ABNORMAL LOW (ref 3.5–5.0)
Alkaline Phosphatase: 53 U/L (ref 38–126)
Anion gap: 9 (ref 5–15)
BUN: 20 mg/dL (ref 6–20)
CO2: 30 mmol/L (ref 22–32)
Calcium: 8.3 mg/dL — ABNORMAL LOW (ref 8.9–10.3)
Chloride: 99 mmol/L (ref 98–111)
Creatinine: 0.84 mg/dL (ref 0.61–1.24)
GFR, Estimated: >60 mL/min
Glucose, Bld: 83 mg/dL (ref 70–99)
Potassium: 4.3 mmol/L (ref 3.5–5.1)
Sodium: 138 mmol/L (ref 135–145)
Total Bilirubin: 0.2 mg/dL — ABNORMAL LOW (ref 0.3–1.2)
Total Protein: 6 g/dL — ABNORMAL LOW (ref 6.5–8.1)

## 2021-11-03 NOTE — Progress Notes (Signed)
Spoke with Beth RN she needed molecular and PDL 1 test results. I printed from [facility] patient portal and gave them to her.

## 2021-11-04 ENCOUNTER — Other Ambulatory Visit: Payer: Self-pay | Admitting: Hematology

## 2021-11-05 ENCOUNTER — Inpatient Hospital Stay (HOSPITAL_BASED_OUTPATIENT_CLINIC_OR_DEPARTMENT_OTHER): Payer: BC Managed Care – PPO | Admitting: Hematology

## 2021-11-05 DIAGNOSIS — C3490 Malignant neoplasm of unspecified part of unspecified bronchus or lung: Secondary | ICD-10-CM

## 2021-11-05 DIAGNOSIS — C7931 Secondary malignant neoplasm of brain: Secondary | ICD-10-CM | POA: Diagnosis not present

## 2021-11-05 DIAGNOSIS — Z7189 Other specified counseling: Secondary | ICD-10-CM | POA: Diagnosis not present

## 2021-11-05 DIAGNOSIS — C342 Malignant neoplasm of middle lobe, bronchus or lung: Secondary | ICD-10-CM | POA: Diagnosis not present

## 2021-11-09 ENCOUNTER — Other Ambulatory Visit: Payer: Self-pay

## 2021-11-09 DIAGNOSIS — C7931 Secondary malignant neoplasm of brain: Secondary | ICD-10-CM

## 2021-11-09 MED ORDER — FLUCONAZOLE 100 MG PO TABS: ORAL_TABLET | ORAL | 0 refills | Status: AC

## 2021-11-14 ENCOUNTER — Encounter: Payer: Self-pay | Admitting: Hematology

## 2021-11-16 ENCOUNTER — Other Ambulatory Visit: Payer: Self-pay

## 2021-11-17 ENCOUNTER — Encounter (HOSPITAL_COMMUNITY): Payer: Self-pay

## 2021-11-17 ENCOUNTER — Telehealth: Payer: Self-pay

## 2021-11-17 ENCOUNTER — Ambulatory Visit
Admission: RE | Admit: 2021-11-17 | Discharge: 2021-11-17 | Disposition: A | Discharge status: Home / Self Care | Payer: BC Managed Care – PPO | Source: Ambulatory Visit | Attending: Radiation Oncology | Admitting: Radiation Oncology

## 2021-11-17 ENCOUNTER — Encounter: Payer: Self-pay | Admitting: Hematology

## 2021-11-17 VITALS — BP 128/93 | HR 121 | Temp 96.8°F | Resp 20 | Ht 69.0 in | Wt 206.2 lb

## 2021-11-17 DIAGNOSIS — R51 Headache with orthostatic component, not elsewhere classified: Secondary | ICD-10-CM | POA: Insufficient documentation

## 2021-11-17 DIAGNOSIS — C3490 Malignant neoplasm of unspecified part of unspecified bronchus or lung: Secondary | ICD-10-CM | POA: Insufficient documentation

## 2021-11-17 DIAGNOSIS — Z7952 Long term (current) use of systemic steroids: Secondary | ICD-10-CM | POA: Insufficient documentation

## 2021-11-17 DIAGNOSIS — C7931 Secondary malignant neoplasm of brain: Secondary | ICD-10-CM | POA: Diagnosis not present

## 2021-11-17 DIAGNOSIS — M7989 Other specified soft tissue disorders: Secondary | ICD-10-CM | POA: Diagnosis not present

## 2021-11-17 DIAGNOSIS — R410 Disorientation, unspecified: Secondary | ICD-10-CM | POA: Insufficient documentation

## 2021-11-17 DIAGNOSIS — R040 Epistaxis: Secondary | ICD-10-CM | POA: Diagnosis not present

## 2021-11-17 DIAGNOSIS — K121 Other forms of stomatitis: Secondary | ICD-10-CM | POA: Insufficient documentation

## 2021-11-17 DIAGNOSIS — Z9221 Personal history of antineoplastic chemotherapy: Secondary | ICD-10-CM | POA: Diagnosis not present

## 2021-11-17 DIAGNOSIS — Z8616 Personal history of COVID-19: Secondary | ICD-10-CM | POA: Diagnosis not present

## 2021-11-17 DIAGNOSIS — Z7189 Other specified counseling: Secondary | ICD-10-CM | POA: Insufficient documentation

## 2021-11-17 MED ORDER — PROCHLORPERAZINE MALEATE 10 MG PO TABS: 10.0000 mg | ORAL_TABLET | Freq: Four times a day (QID) | ORAL | 1 refills | Status: AC | PRN

## 2021-11-17 MED ORDER — ONDANSETRON HCL 8 MG PO TABS: 8.0000 mg | ORAL_TABLET | Freq: Two times a day (BID) | ORAL | 1 refills | Status: DC | PRN

## 2021-11-17 MED ORDER — FOLIC ACID 1 MG PO TABS: 1.0000 mg | ORAL_TABLET | Freq: Every day | ORAL | 3 refills | Status: DC

## 2021-11-17 MED ORDER — LIDOCAINE-PRILOCAINE 2.5-2.5 % EX CREA: TOPICAL_CREAM | CUTANEOUS | 3 refills | Status: DC

## 2021-11-17 MED ORDER — DEXAMETHASONE 4 MG PO TABS: ORAL_TABLET | ORAL | 1 refills | Status: DC

## 2021-11-17 NOTE — Addendum Note (Signed)
Addended by: Sullivan Lone on: 11/17/2021 09:01 AM   Modules accepted: Orders, Level of Service

## 2021-11-17 NOTE — Progress Notes (Signed)
Patient's condition (tachycardia, active nose bleed, and possible need for diuresis for lower extremity edema) was determined to be out of [facility] scope. Reached out to [facility] charge nurse who advised that a bay was not currently available, but she would call me as soon as something opened up so patient wouldn't have to wait in main ED lobby. Updated patient and patient's wife. Wife declined to wait, and stated she would try to see if patient's PCP could see patient otherwise she would take patient to Urgent Care on [address]. Patient and wife ambulated out of clinic unassisted

## 2021-11-17 NOTE — Progress Notes (Addendum)
Center  [phone] [phone]    Oncology Clinic Visit Note  Encounter date.11/05/2021    Referral MD: Dr. Riccardo Dubin Arrien  CC: newly diagnosed metastatic lung adenocarcinoma with brain mets.   HPI: Mr. Charters is a 60 year old male with a past medical history significant for melanoma currently in remission followed at [facility].  He presented to the emergency department with left-sided weakness.  He was noted to have left lower extremity weakness for about 3 to 4 days and then developed left upper extremity weakness the day of admission.  MRI of the brain showed multiple peripherally enhancing intracranial lesions, the largest of which appears to be dural based along the right aspect of the falx with 2 additional peripheral enhancing lesions at the left frontal and parietal lobes which are concerning for metastatic disease.  CT chest/abdomen/pelvis showed a right hilar mass which narrows the right middle lobe pulmonary artery and bronchus with obstruction of the lateral segment bronchus in the right middle lobe, mass difficult to accurately measure given the associated postobstructive atelectasis but measures approximately 5.6 x 4.0 cm, supraclavicular, mediastinal, and right hilar adenopathy, multiple groundglass opacities which are nonspecific and may be infectious and inflammatory versus reflecting metastatic disease involvement, no evidence of metastatic disease in the abdomen/pelvis.  The patient underwent bronchoscopy with EBUS earlier today and note from PCCM indicates that the samples from all 3 lymph nodes are consistent with non-small cell lung cancer.   I met with the patient, his wife, and son in his hospital room.  Since starting on dexamethasone, the patient reports that his left-sided weakness is much improved.  He is eating well.  He is not having any headaches or dizziness at this time.  He has no chest pain or shortness of breath but  does have a mild cough.  He is not having any abdominal pain, nausea, vomiting.  He tells me that he has been followed at [facility] for many years for his history of melanoma.  He received about 6 months of interferon in the past for the melanoma.  Medical Oncology was asked to see the patient to make recommendations regarding his newly diagnosed non-small cell lung cancer.  Patient notes some intermittent headaches.  No acute new shortness of breath or chest pain. Did have neuronavigational MRI done 10/15/2021 which showed 1. Slight interval decrease in size of 3 enhancing intracranial lesions as detailed above, with associated decreased surrounding edema. 2. No new lesions.   He has being simulated and has been scheduled for his [place] treatment on 10/23/2021. We discussed that we have sent out molecular testing on lung cancer tissue awaiting molecular results to decide on both optimal systemic treatment plan after completion of radiation, He has been cutting back on his smoking. Tolerating steroids.  Steroid taper as per radiation oncology. No infection issues.  INTERVAL HISTORY  .I connected with [name] on 11/17/21 at  9:40 AM EST by telephone visit and verified that I am speaking with the correct person using two identifiers.   I discussed the limitations, risks, security and privacy concerns of performing an evaluation and management service by telemedicine and the availability of in-person appointments. I also discussed with the patient that there may be a patient responsible charge related to this service. The patient expressed understanding and agreed to proceed.   Other persons participating in the visit and their role in the encounter: Patient's wife  Patient's location: Home Provider's location: [place]  Chief Complaint: Follow-up to discuss management of metastatic lung cancer  Patient was called to discuss his results of his guardant 360 testing and to plan  his systemic treatment for metastatic lung cancer now that he has recently completed his stereotactic radiation therapy for his brain metastases. He notes a mild headache and some difficulty sleeping due to steroids.  He has recommendations for a steroid taper by radiation oncology.  Notes no acute new focal neurological deficits. He was in clinic yesterday but had to be rescheduled as a phone visit since he recently had significant respiratory symptoms with cough and colored phlegm and was noted to be positive for both COVID-19 as well as influenza A infections.  He was treated by his primary care PA-C Alyssa Allwardt with Tamiflu and Molnupiravir and is noting improvement in his respiratory symptoms and resolution of his fevers.  We discussed his guardant 360 results in detail that did not show any targetable mutations based on commercially available drugs outside of a clinical trial.   His MSI high biomarker was not detected.  We discussed that we would recommend proceeding with a platinum doublet chemotherapy with pembrolizumab immunotherapy as palliative chemoimmunotherapy option.  We would have to wait at least 3 weeks prior to his starting chemotherapy given his recent COVID-19 and influenza infections.  Patient notes that his respiratory status is currently stable and his cough is somewhat improved.  No hemoptysis.  No acute chest pain.  No acute shortness of breath at this time.       Past Medical History:  Diagnosis Date   Abnormal TSH 09/28/2017   Anxiety 07/03/2012   Depression     Difficulty sleeping 09/28/2017   Examination of participant in clinical trial 09/11/2012   History of immunotherapy 02/23/2016   Melanoma of skin 10/26/2011   Nicotine dependence 07/17/2015   Smoker    :         Past Surgical History:  Procedure Laterality Date   LEFT POPLITEAL MASS RESECTION   08/27/2011    pigmented villonodular synovitis, negative for malignancy   LYMPH GLAND EXCISION    07/12/2011    right axilla, consistent with melanoma   LYMPH NODE DISSECTION   08/27/2011    right, 0/17 nodes positive  :             Current Facility-Administered Medications  Medication Dose Route Frequency Provider Last Rate Last Admin   acetaminophen (TYLENOL) tablet 650 mg  650 mg Oral Q6H PRN Olalere, Adewale A, MD        Or   acetaminophen (TYLENOL) suppository 650 mg  650 mg Rectal Q6H PRN Olalere, Adewale A, MD       dexamethasone (DECADRON) injection 6 mg  6 mg Intravenous Q6H Olalere, Adewale A, MD   6 mg at 10/09/21 0450   insulin aspart (novoLOG) injection 0-9 Units  0-9 Units Subcutaneous TID WC Olalere, Adewale A, MD   1 Units at 10/08/21 0740   lactated ringers infusion   Intravenous Continuous Olalere, Adewale A, MD   New Bag at 10/09/21 1130   nicotine (NICODERM CQ - dosed in mg/24 hours) patch 21 mg  21 mg Transdermal Once Olalere, Adewale A, MD       pantoprazole (PROTONIX) EC tablet 40 mg  40 mg Oral Daily Olalere, Adewale A, MD   40 mg at 10/08/21 0953             Allergies  Allergen Reactions   Iopamidol Hives and Itching  Isovue break through with prep, Hives and Itching   Patient had PET?CT scan with 125 mls of Isovue-300. Noted to have two raised itchy hives which self-resolved. Patient had PET/CT scan with contrast on 05/27/2014 while on prednisone prep (3 doses of 50 mg-13 hour prep) and noted to have two hives which self -resolved without treatment   Iodinated Diagnostic Agents Rash   Iodine-131 Rash  :         Family History  Problem Relation Age of Onset   Hypertension Mother     CAD Mother     Lung cancer Father    :    Social History         Socioeconomic History   Marital status: Divorced      Spouse name: Not on file   Number of children: 3   Years of education: Not on file   Highest education level: Not on file  Occupational History      Employer: [employer]  Tobacco Use   Smoking status: Every Day      Packs/day: 0.25       Types: Cigarettes   Smokeless tobacco: Never  Vaping Use   Vaping Use: Never used  Substance and Sexual Activity   Alcohol use: Yes      Alcohol/week: 6.0 standard drinks      Types: 6 Shots of liquor per week   Drug use: No   Sexual activity: Not on file  Other Topics Concern   Not on file  Social History Narrative   Not on file    Social Determinants of Health    Financial Resource Strain: Not on file  Food Insecurity: Not on file  Transportation Needs: Not on file  Physical Activity: Not on file  Stress: Not on file  Social Connections: Not on file  Intimate Partner Violence: Not on file  :   Review of Systems:.10 Point review of Systems was done is negative except as noted above.   Exam:.There were no vitals taken for this visit. Marland Kitchen GENERAL:alert, in no acute distress and comfortable SKIN: no acute rashes, no significant lesions EYES: conjunctiva are pink and non-injected, sclera anicteric OROPHARYNX: MMM, no exudates, no oropharyngeal erythema or ulceration NECK: supple, no JVD LYMPH:  no palpable lymphadenopathy in the cervical, axillary or inguinal regions LUNGS: clear to auscultation b/l with normal respiratory effort HEART: regular rate & rhythm ABDOMEN:  normoactive bowel sounds , non tender, not distended. Extremity: no pedal edema PSYCH: alert & oriented x 3 with fluent speech NEURO: no focal motor/sensory deficits   LABS  . CBC Latest Ref Rng & Units 11/03/2021 10/20/2021 10/07/2021  WBC 4.0 - 10.5 K/uL 16.0(H) 24.9(H) 8.4  Hemoglobin 13.0 - 17.0 g/dL 12.8(L) 12.0(L) 14.3  Hematocrit 39.0 - 52.0 % 39.0 35.5(L) 42.8  Platelets 150 - 400 K/uL 203 274 456(H)    . CMP Latest Ref Rng & Units 11/03/2021 10/20/2021 10/07/2021  Glucose 70 - 99 mg/dL 83 91 244(H)  BUN 6 - 20 mg/dL _0 Creatinine 0.61 - 1.24 mg/dL 0.84 0.70 0.67  Sodium 135 - 145 mmol/L 138 135 135  Potassium 3.5 - 5.1 mmol/L 4.3 4.5 3.9  Chloride 98 - 111 mmol/L 99 97(L) 101   CO2 22 - 32 mmol/L _1 Calcium 8.9 - 10.3 mg/dL 8.3(L) 8.2(L) 8.9  Total Protein 6.5 - 8.1 g/dL 6.0(L) 5.6(L) -  Total Bilirubin 0.3 - 1.2 mg/dL 0.2(L) 0.3 -  Alkaline Phos 38 -  126 U/L 53 46 -  AST 15 - 41 U/L 17 14(L) -  ALT 0 - 44 U/L 27 26 -  SURGICAL PATHOLOGY   THIS IS AN ADDENDUM REPORT   CASE: [id number]  PATIENT: [place]  Surgical Pathology Report  Addendum    Reason for Addendum #1:  Immunohistochemistry results   Clinical History: RML mass, (adc)      FINAL MICROSCOPIC DIAGNOSIS:   A. LUNG, RML MASS, BIOPSY:  - Poorly differentiated non-small cell carcinoma.  - See comment.   COMMENT:  Immunohistochemistry will be performed and reported as an addendum.   Dr. Vic Ripper agrees    GROSS DESCRIPTION:   Specimen is received in formalin and consists of multiple pieces of  tan-red soft tissue, ranging from less than 0.1 to 0.2 cm in greatest  dimension.  The specimen is entirely submitted in 1 cassette.  Craig Staggers  10/09/2021)   Final Diagnosis performed by Claudette Laws, MD.   Electronically signed  10/12/2021  Technical and / or Professional components performed at [facility], [place] [address], [place], [place] [address].   Immunohistochemistry Technical component (if applicable) was performed  at [facility]. [address], [place],  [place], [place] [address].   IMMUNOHISTOCHEMISTRY DISCLAIMER (if applicable):  Some of these immunohistochemical stains may have been developed and the  performance characteristics determine by [facility]. Some  may not have been cleared or approved by the U.S. Food and Drug  Administration. The FDA has determined that such clearance or approval  is not necessary. This test is used for clinical purposes. It should not  be regarded as investigational or for research. This laboratory is  certified under the [employer]   (CLIA-88) as qualified to perform high complexity clinical laboratory  testing.  The controls stained appropriately.   ADDENDUM: The carcinoma is positive with TTF-1 and negative with  cytokeratin 5/6 and p63 consistent with poorly differentiated  adenocarcinoma.  There is likely sufficient tissue for limited molecular  testing.   Imaging Results  CT Head Wo Contrast   Result Date: 10/06/2021 CLINICAL DATA:  Neuro deficit, acute, stroke suspected. Left sided weakness. History of melanoma. EXAM: CT HEAD WITHOUT CONTRAST TECHNIQUE: Contiguous axial images were obtained from the base of the skull through the vertex without intravenous contrast. COMPARISON:  07/28/2011 head MRI report FINDINGS: Brain: There is extensive vasogenic edema in the right frontal lobe with milder edema anteriorly in the left frontal lobe. There is a suspected 2 cm mass in the parasagittal right frontal lobe. Edema results in mild mass effect on the frontal horn of the right lateral ventricle. No intracranial hemorrhage, definite acute cortically based infarct, significant midline shift, or extra-axial fluid collection is identified. Vascular: Calcified atherosclerosis at the skull base. No hyperdense vessel. Skull: No fracture or suspicious osseous lesion. Sinuses/Orbits: Mild mucosal thickening in the paranasal sinuses. Clear mastoid air cells. Unremarkable orbits. Other: None. IMPRESSION: Vasogenic edema in the frontal lobes, extensive on the right. This is most concerning for metastatic disease, and a brain MRI without and with contrast is recommended for further evaluation. Electronically Signed   By: Logan Bores M.D.   On: 10/06/2021 18:59    CT CHEST W CONTRAST   Result Date: 10/07/2021 CLINICAL DATA:  Metastatic disease evaluation. EXAM: CT CHEST, ABDOMEN, AND PELVIS WITH CONTRAST TECHNIQUE: Multidetector CT imaging of the chest, abdomen and pelvis was performed following the standard protocol during bolus  administration of intravenous contrast. CONTRAST:  58m OMNIPAQUE IOHEXOL 350 MG/ML SOLN COMPARISON:  No relevant priors available at time dictation. FINDINGS: CT CHEST FINDINGS Cardiovascular: Aortic and branch vessel atherosclerosis without aneurysmal dilation. No central pulmonary embolus on this nondedicated study. Normal size heart. No significant pericardial effusion/thickening. Mediastinum/Nodes: Supraclavicular adenopathy for instance a 2.2 cm left supraclavicular lymph node on image 4/2. No discrete thyroid nodule. Mediastinal and right hilar adenopathy. For reference 2.2 cm a low right paratracheal lymph node on image 22/2 and a 1.8 cm right hilar lymph node on image 28/2. No pathologically enlarged axillary lymph nodes. Surgical clips in the right axilla. Lungs/Pleura: Right hilar mass which narrows the right middle lobe pulmonary artery and bronchus with obstruction of the lateral segment bronchus of the right middle lobe. The mass is difficult to accurately measure given the associated postobstructive atelectasis, but measures approximately 5.6 x 4.0 cm on image 36/2. There are multiple ground-glass nodular opacities some of which demonstrate a central solid component for instance a 1.8 cm ground-glass opacity on image 53/4, a 1.1 cm nodular opacity on image 45/4 and a 0.9 cm nodular opacity image 48/4. No suspicious left-sided pulmonary nodules or masses. No pleural effusion. No pneumothorax. Apical predominant paraseptal emphysematous change. Musculoskeletal: No aggressive lytic or blastic lesion bone. Multilevel degenerative changes spine. CT ABDOMEN PELVIS FINDINGS Hepatobiliary: No suspicious hepatic lesion. Gallbladder is unremarkable. No biliary ductal dilation. Pancreas: No pancreatic ductal dilation or evidence of acute inflammation. Spleen: Within normal limits. Adrenals/Urinary Tract: Bilateral adrenal glands are unremarkable. No hydronephrosis. Left cortical renal scarring. No solid  enhancing renal lesion. Mild wall thickening of an incompletely distended urinary bladder. Stomach/Bowel: Stomach is unremarkable for degree of distension. No pathologic dilation of small or large bowel. The appendix and terminal ileum appear normal. Colon is predominately decompressed limiting evaluation. Vascular/Lymphatic: Aortic and branch vessel atherosclerosis without abdominal aortic aneurysm. No pathologically enlarged abdominal or pelvic lymph nodes. Reproductive: Dystrophic prostatic calcifications. Other: No significant abdominopelvic ascites. Musculoskeletal: Multilevel degenerative changes spine. 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 IMPRESSION: 1. Right hilar mass which narrows the right middle lobe pulmonary artery and bronchus with obstruction of the lateral segment bronchus of the right middle lobe. The mass is difficult to accurately measure given the associated postobstructive atelectasis, but measures approximately 5.6 x 4.0 cm. 2. Supraclavicular, mediastinal and right hilar adenopathy. 3. Multiple ground-glass nodule in opacities some of which demonstrate a central solid component. Findings which are nonspecific and may be infectious and inflammatory versus reflecting metastatic disease involvement. Suggest attention on short-term interval follow-up study in 3 months. 4. No evidence of metastatic disease within the abdomen or pelvis. 5. Mild wall thickening of an incompletely distended urinary bladder. Correlate with urinalysis to exclude cystitis. 6. 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 7. Aortic Atherosclerosis (ICD10-I70.0). Electronically Signed   By: JDahlia BailiffM.D.   On: 10/07/2021 19:07    MR BRAIN W WO CONTRAST   Result Date: 10/06/2021 CLINICAL DATA:  Brain mass or lesion EXAM: MRI HEAD WITHOUT AND WITH CONTRAST TECHNIQUE: Multiplanar, multiecho  pulse sequences of the brain and surrounding structures were obtained without and with intravenous contrast. CONTRAST:  8.558mGADAVIST GADOBUTROL 1 MMOL/ML IV SOLN COMPARISON:  No prior MRI, correlation is made with same day CT brain FINDINGS: Brain: Multiple peripherally enhancing intracranial lesions, the largest of which is a 2.2 x 1.5 x  2.3 cm (AP x TR x CC) mass along the right frontal falx and is favored to extra-axial (series 16, image 138 and series 19, image 16). Significant surrounding T2 hyperintense signal, likely edema, which extends into the anterior and posterior right frontal lobe. Additional peripherally enhancing intraparenchymal lesions are also noted in the anterior left frontal lobe and posterior left parietal lobe. The left frontal lobe lesion measures 1.1 x 0.7 x 0.9 cm (series 16, image 121 and series 19, image 10). The left parietal lesion measures 0.8 x 0.7 x 0.6 cm (series 16, image 124 and series 19, image 26). There is significant edema associated with the left frontal but not the left parietal lesion. No acute infarct, hemorrhage, hydrocephalus, extra-axial collection, or midline shift. Vascular: Normal flow voids. Skull and upper cervical spine: Intrinsically T1 hyperintense lesion in the clivus (series 16, image 31), indeterminate for enhancement. Otherwise normal marrow signal. Sinuses/Orbits: Mucosal thickening in the ethmoid air cells. Otherwise negative. Other: The mastoids are well aerated. IMPRESSION: Multiple peripherally enhancing intracranial lesions, the largest of which appears to be dural based, along the right aspect of the falx, with 2 additional peripherally enhancing lesions in the left frontal and parietal lobes. These are overall concerning for metastatic disease. Electronically Signed   By: Merilyn Baba M.D.   On: 10/06/2021 23:07    CT ABDOMEN PELVIS W CONTRAST   Result Date: 10/07/2021 CLINICAL DATA:  Metastatic disease evaluation. EXAM: CT CHEST, ABDOMEN,  AND PELVIS WITH CONTRAST TECHNIQUE: Multidetector CT imaging of the chest, abdomen and pelvis was performed following the standard protocol during bolus administration of intravenous contrast. CONTRAST:  39m OMNIPAQUE IOHEXOL 350 MG/ML SOLN COMPARISON:  No relevant priors available at time dictation. FINDINGS: CT CHEST FINDINGS Cardiovascular: Aortic and branch vessel atherosclerosis without aneurysmal dilation. No central pulmonary embolus on this nondedicated study. Normal size heart. No significant pericardial effusion/thickening. Mediastinum/Nodes: Supraclavicular adenopathy for instance a 2.2 cm left supraclavicular lymph node on image 4/2. No discrete thyroid nodule. Mediastinal and right hilar adenopathy. For reference 2.2 cm a low right paratracheal lymph node on image 22/2 and a 1.8 cm right hilar lymph node on image 28/2. No pathologically enlarged axillary lymph nodes. Surgical clips in the right axilla. Lungs/Pleura: Right hilar mass which narrows the right middle lobe pulmonary artery and bronchus with obstruction of the lateral segment bronchus of the right middle lobe. The mass is difficult to accurately measure given the associated postobstructive atelectasis, but measures approximately 5.6 x 4.0 cm on image 36/2. There are multiple ground-glass nodular opacities some of which demonstrate a central solid component for instance a 1.8 cm ground-glass opacity on image 53/4, a 1.1 cm nodular opacity on image 45/4 and a 0.9 cm nodular opacity image 48/4. No suspicious left-sided pulmonary nodules or masses. No pleural effusion. No pneumothorax. Apical predominant paraseptal emphysematous change. Musculoskeletal: No aggressive lytic or blastic lesion bone. Multilevel degenerative changes spine. CT ABDOMEN PELVIS FINDINGS Hepatobiliary: No suspicious hepatic lesion. Gallbladder is unremarkable. No biliary ductal dilation. Pancreas: No pancreatic ductal dilation or evidence of acute inflammation. Spleen:  Within normal limits. Adrenals/Urinary Tract: Bilateral adrenal glands are unremarkable. No hydronephrosis. Left cortical renal scarring. No solid enhancing renal lesion. Mild wall thickening of an incompletely distended urinary bladder. Stomach/Bowel: Stomach is unremarkable for degree of distension. No pathologic dilation of small or large bowel. The appendix and terminal ileum appear normal. Colon is predominately decompressed limiting evaluation. Vascular/Lymphatic: Aortic and branch vessel atherosclerosis without abdominal aortic aneurysm. No pathologically enlarged  abdominal or pelvic lymph nodes. Reproductive: Dystrophic prostatic calcifications. Other: No significant abdominopelvic ascites. Musculoskeletal: Multilevel degenerative changes spine. 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 IMPRESSION: 1. Right hilar mass which narrows the right middle lobe pulmonary artery and bronchus with obstruction of the lateral segment bronchus of the right middle lobe. The mass is difficult to accurately measure given the associated postobstructive atelectasis, but measures approximately 5.6 x 4.0 cm. 2. Supraclavicular, mediastinal and right hilar adenopathy. 3. Multiple ground-glass nodule in opacities some of which demonstrate a central solid component. Findings which are nonspecific and may be infectious and inflammatory versus reflecting metastatic disease involvement. Suggest attention on short-term interval follow-up study in 3 months. 4. No evidence of metastatic disease within the abdomen or pelvis. 5. Mild wall thickening of an incompletely distended urinary bladder. Correlate with urinalysis to exclude cystitis. 6. 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 7. Aortic Atherosclerosis (ICD10-I70.0). Electronically Signed   By: Dahlia Bailiff M.D.   On: 10/07/2021 19:07    US Venous Img Lower  Unilateral Left   Result Date: 10/06/2021 CLINICAL DATA:  Pain. EXAM: Left LOWER EXTREMITY VENOUS DOPPLER ULTRASOUND TECHNIQUE: Gray-scale sonography with compression, as well as color and duplex ultrasound, were performed to evaluate the deep venous system(s) from the level of the common femoral vein through the popliteal and proximal calf veins. COMPARISON:  None. FINDINGS: VENOUS Normal compressibility of the common femoral, superficial femoral, and popliteal veins, as well as the visualized calf veins. Visualized portions of profunda femoral vein and great saphenous vein unremarkable. No filling defects to suggest DVT on grayscale or color Doppler imaging. Doppler waveforms show normal direction of venous flow, normal respiratory plasticity and response to augmentation. Limited views of the contralateral common femoral vein are unremarkable. OTHER Slow flow noted within the left popliteal vein of uncertain etiology. Limitations: none IMPRESSION: No evidence for deep venous thrombosis in the left lower extremity. Electronically Signed   By: Ronney Asters M.D.   On: 10/06/2021 20:09    DG Chest Portable 1 View   Result Date: 10/06/2021 CLINICAL DATA:  Pain EXAM: PORTABLE CHEST 1 VIEW COMPARISON:  08/11/2021 FINDINGS: Persistent right middle lobe opacity no pleural effusion. Stable cardiomediastinal silhouette. IMPRESSION: Persistent right middle lobe opacity, given lack of interval clearing, recommend CT chest for further evaluation. Electronically Signed   By: Donavan Foil M.D.   On: 10/06/2021 19:08        Imaging Results  CT Head Wo Contrast   Result Date: 10/06/2021 CLINICAL DATA:  Neuro deficit, acute, stroke suspected. Left sided weakness. History of melanoma. EXAM: CT HEAD WITHOUT CONTRAST TECHNIQUE: Contiguous axial images were obtained from the base of the skull through the vertex without intravenous contrast. COMPARISON:  07/28/2011 head MRI report FINDINGS: Brain: There is extensive  vasogenic edema in the right frontal lobe with milder edema anteriorly in the left frontal lobe. There is a suspected 2 cm mass in the parasagittal right frontal lobe. Edema results in mild mass effect on the frontal horn of the right lateral ventricle. No intracranial hemorrhage, definite acute cortically based infarct, significant midline shift, or extra-axial fluid collection is identified. Vascular: Calcified atherosclerosis at the skull base. No hyperdense vessel. Skull: No fracture or suspicious osseous lesion. Sinuses/Orbits: Mild mucosal thickening in the paranasal sinuses. Clear mastoid air cells. Unremarkable orbits. Other: None. IMPRESSION: Vasogenic edema in the frontal lobes, extensive on  the right. This is most concerning for metastatic disease, and a brain MRI without and with contrast is recommended for further evaluation. Electronically Signed   By: Logan Bores M.D.   On: 10/06/2021 18:59    CT CHEST W CONTRAST   Result Date: 10/07/2021 CLINICAL DATA:  Metastatic disease evaluation. EXAM: CT CHEST, ABDOMEN, AND PELVIS WITH CONTRAST TECHNIQUE: Multidetector CT imaging of the chest, abdomen and pelvis was performed following the standard protocol during bolus administration of intravenous contrast. CONTRAST:  92m OMNIPAQUE IOHEXOL 350 MG/ML SOLN COMPARISON:  No relevant priors available at time dictation. FINDINGS: CT CHEST FINDINGS Cardiovascular: Aortic and branch vessel atherosclerosis without aneurysmal dilation. No central pulmonary embolus on this nondedicated study. Normal size heart. No significant pericardial effusion/thickening. Mediastinum/Nodes: Supraclavicular adenopathy for instance a 2.2 cm left supraclavicular lymph node on image 4/2. No discrete thyroid nodule. Mediastinal and right hilar adenopathy. For reference 2.2 cm a low right paratracheal lymph node on image 22/2 and a 1.8 cm right hilar lymph node on image 28/2. No pathologically enlarged axillary lymph nodes. Surgical  clips in the right axilla. Lungs/Pleura: Right hilar mass which narrows the right middle lobe pulmonary artery and bronchus with obstruction of the lateral segment bronchus of the right middle lobe. The mass is difficult to accurately measure given the associated postobstructive atelectasis, but measures approximately 5.6 x 4.0 cm on image 36/2. There are multiple ground-glass nodular opacities some of which demonstrate a central solid component for instance a 1.8 cm ground-glass opacity on image 53/4, a 1.1 cm nodular opacity on image 45/4 and a 0.9 cm nodular opacity image 48/4. No suspicious left-sided pulmonary nodules or masses. No pleural effusion. No pneumothorax. Apical predominant paraseptal emphysematous change. Musculoskeletal: No aggressive lytic or blastic lesion bone. Multilevel degenerative changes spine. CT ABDOMEN PELVIS FINDINGS Hepatobiliary: No suspicious hepatic lesion. Gallbladder is unremarkable. No biliary ductal dilation. Pancreas: No pancreatic ductal dilation or evidence of acute inflammation. Spleen: Within normal limits. Adrenals/Urinary Tract: Bilateral adrenal glands are unremarkable. No hydronephrosis. Left cortical renal scarring. No solid enhancing renal lesion. Mild wall thickening of an incompletely distended urinary bladder. Stomach/Bowel: Stomach is unremarkable for degree of distension. No pathologic dilation of small or large bowel. The appendix and terminal ileum appear normal. Colon is predominately decompressed limiting evaluation. Vascular/Lymphatic: Aortic and branch vessel atherosclerosis without abdominal aortic aneurysm. No pathologically enlarged abdominal or pelvic lymph nodes. Reproductive: Dystrophic prostatic calcifications. Other: No significant abdominopelvic ascites. Musculoskeletal: Multilevel degenerative changes spine. 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 IMPRESSION: 1. Right hilar mass which  narrows the right middle lobe pulmonary artery and bronchus with obstruction of the lateral segment bronchus of the right middle lobe. The mass is difficult to accurately measure given the associated postobstructive atelectasis, but measures approximately 5.6 x 4.0 cm. 2. Supraclavicular, mediastinal and right hilar adenopathy. 3. Multiple ground-glass nodule in opacities some of which demonstrate a central solid component. Findings which are nonspecific and may be infectious and inflammatory versus reflecting metastatic disease involvement. Suggest attention on short-term interval follow-up study in 3 months. 4. No evidence of metastatic disease within the abdomen or pelvis. 5. Mild wall thickening of an incompletely distended urinary bladder. Correlate with urinalysis to exclude cystitis. 6. 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 7. Aortic Atherosclerosis (ICD10-I70.0). Electronically Signed   By: JDahlia BailiffM.D.   On: 10/07/2021 19:07    MR  BRAIN W WO CONTRAST   Result Date: 10/06/2021 CLINICAL DATA:  Brain mass or lesion EXAM: MRI HEAD WITHOUT AND WITH CONTRAST TECHNIQUE: Multiplanar, multiecho pulse sequences of the brain and surrounding structures were obtained without and with intravenous contrast. CONTRAST:  8.41m GADAVIST GADOBUTROL 1 MMOL/ML IV SOLN COMPARISON:  No prior MRI, correlation is made with same day CT brain FINDINGS: Brain: Multiple peripherally enhancing intracranial lesions, the largest of which is a 2.2 x 1.5 x 2.3 cm (AP x TR x CC) mass along the right frontal falx and is favored to extra-axial (series 16, image 138 and series 19, image 16). Significant surrounding T2 hyperintense signal, likely edema, which extends into the anterior and posterior right frontal lobe. Additional peripherally enhancing intraparenchymal lesions are also noted in the anterior left frontal lobe and posterior left parietal lobe. The left  frontal lobe lesion measures 1.1 x 0.7 x 0.9 cm (series 16, image 121 and series 19, image 10). The left parietal lesion measures 0.8 x 0.7 x 0.6 cm (series 16, image 124 and series 19, image 26). There is significant edema associated with the left frontal but not the left parietal lesion. No acute infarct, hemorrhage, hydrocephalus, extra-axial collection, or midline shift. Vascular: Normal flow voids. Skull and upper cervical spine: Intrinsically T1 hyperintense lesion in the clivus (series 16, image 31), indeterminate for enhancement. Otherwise normal marrow signal. Sinuses/Orbits: Mucosal thickening in the ethmoid air cells. Otherwise negative. Other: The mastoids are well aerated. IMPRESSION: Multiple peripherally enhancing intracranial lesions, the largest of which appears to be dural based, along the right aspect of the falx, with 2 additional peripherally enhancing lesions in the left frontal and parietal lobes. These are overall concerning for metastatic disease. Electronically Signed   By: AMerilyn BabaM.D.   On: 10/06/2021 23:07    CT ABDOMEN PELVIS W CONTRAST   Result Date: 10/07/2021 CLINICAL DATA:  Metastatic disease evaluation. EXAM: CT CHEST, ABDOMEN, AND PELVIS WITH CONTRAST TECHNIQUE: Multidetector CT imaging of the chest, abdomen and pelvis was performed following the standard protocol during bolus administration of intravenous contrast. CONTRAST:  877mOMNIPAQUE IOHEXOL 350 MG/ML SOLN COMPARISON:  No relevant priors available at time dictation. FINDINGS: CT CHEST FINDINGS Cardiovascular: Aortic and branch vessel atherosclerosis without aneurysmal dilation. No central pulmonary embolus on this nondedicated study. Normal size heart. No significant pericardial effusion/thickening. Mediastinum/Nodes: Supraclavicular adenopathy for instance a 2.2 cm left supraclavicular lymph node on image 4/2. No discrete thyroid nodule. Mediastinal and right hilar adenopathy. For reference 2.2 cm a low right  paratracheal lymph node on image 22/2 and a 1.8 cm right hilar lymph node on image 28/2. No pathologically enlarged axillary lymph nodes. Surgical clips in the right axilla. Lungs/Pleura: Right hilar mass which narrows the right middle lobe pulmonary artery and bronchus with obstruction of the lateral segment bronchus of the right middle lobe. The mass is difficult to accurately measure given the associated postobstructive atelectasis, but measures approximately 5.6 x 4.0 cm on image 36/2. There are multiple ground-glass nodular opacities some of which demonstrate a central solid component for instance a 1.8 cm ground-glass opacity on image 53/4, a 1.1 cm nodular opacity on image 45/4 and a 0.9 cm nodular opacity image 48/4. No suspicious left-sided pulmonary nodules or masses. No pleural effusion. No pneumothorax. Apical predominant paraseptal emphysematous change. Musculoskeletal: No aggressive lytic or blastic lesion bone. Multilevel degenerative changes spine. CT ABDOMEN PELVIS FINDINGS Hepatobiliary: No suspicious hepatic lesion. Gallbladder is unremarkable. No biliary ductal dilation. Pancreas: No pancreatic  ductal dilation or evidence of acute inflammation. Spleen: Within normal limits. Adrenals/Urinary Tract: Bilateral adrenal glands are unremarkable. No hydronephrosis. Left cortical renal scarring. No solid enhancing renal lesion. Mild wall thickening of an incompletely distended urinary bladder. Stomach/Bowel: Stomach is unremarkable for degree of distension. No pathologic dilation of small or large bowel. The appendix and terminal ileum appear normal. Colon is predominately decompressed limiting evaluation. Vascular/Lymphatic: Aortic and branch vessel atherosclerosis without abdominal aortic aneurysm. No pathologically enlarged abdominal or pelvic lymph nodes. Reproductive: Dystrophic prostatic calcifications. Other: No significant abdominopelvic ascites. Musculoskeletal: Multilevel degenerative changes  spine. 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 IMPRESSION: 1. Right hilar mass which narrows the right middle lobe pulmonary artery and bronchus with obstruction of the lateral segment bronchus of the right middle lobe. The mass is difficult to accurately measure given the associated postobstructive atelectasis, but measures approximately 5.6 x 4.0 cm. 2. Supraclavicular, mediastinal and right hilar adenopathy. 3. Multiple ground-glass nodule in opacities some of which demonstrate a central solid component. Findings which are nonspecific and may be infectious and inflammatory versus reflecting metastatic disease involvement. Suggest attention on short-term interval follow-up study in 3 months. 4. No evidence of metastatic disease within the abdomen or pelvis. 5. Mild wall thickening of an incompletely distended urinary bladder. Correlate with urinalysis to exclude cystitis. 6. 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 7. Aortic Atherosclerosis (ICD10-I70.0). Electronically Signed   By: Dahlia Bailiff M.D.   On: 10/07/2021 19:07    US Venous Img Lower Unilateral Left   Result Date: 10/06/2021 CLINICAL DATA:  Pain. EXAM: Left LOWER EXTREMITY VENOUS DOPPLER ULTRASOUND TECHNIQUE: Gray-scale sonography with compression, as well as color and duplex ultrasound, were performed to evaluate the deep venous system(s) from the level of the common femoral vein through the popliteal and proximal calf veins. COMPARISON:  None. FINDINGS: VENOUS Normal compressibility of the common femoral, superficial femoral, and popliteal veins, as well as the visualized calf veins. Visualized portions of profunda femoral vein and great saphenous vein unremarkable. No filling defects to suggest DVT on grayscale or color Doppler imaging. Doppler waveforms show normal direction of venous flow, normal respiratory plasticity  and response to augmentation. Limited views of the contralateral common femoral vein are unremarkable. OTHER Slow flow noted within the left popliteal vein of uncertain etiology. Limitations: none IMPRESSION: No evidence for deep venous thrombosis in the left lower extremity. Electronically Signed   By: Ronney Asters M.D.   On: 10/06/2021 20:09    DG Chest Portable 1 View   Result Date: 10/06/2021 CLINICAL DATA:  Pain EXAM: PORTABLE CHEST 1 VIEW COMPARISON:  08/11/2021 FINDINGS: Persistent right middle lobe opacity no pleural effusion. Stable cardiomediastinal silhouette. IMPRESSION: Persistent right middle lobe opacity, given lack of interval clearing, recommend CT chest for further evaluation. Electronically Signed   By: Donavan Foil M.D.   On: 10/06/2021 19:08       Assessment and Plan:   This is a 60 year old male with   1) stage IV poorly differentiated lung adenocarcinoma No targetable mutations on guardant 360 MSI high not detected -10/06/2021 the MRI of the brain with and without contrast- "Multiple peripherally enhancing intracranial lesions, the largest of which appears to be dural based, along the right aspect of the falx, with 2 additional peripherally enhancing lesions in the left frontal and parietal lobes. These are overall concerning for metastatic disease." -10/07/2021 CT chest/abdomen/pelvis  with contrast- "1. Right hilar mass which narrows the right middle lobe pulmonary artery and bronchus with obstruction of the lateral segment bronchus of the right middle lobe. The mass is difficult to accurately measure given the associated postobstructive atelectasis, but measures approximately 5.6 x 4.0 cm. 2. Supraclavicular, mediastinal and right hilar adenopathy. 3. Multiple ground-glass nodule in opacities some of which demonstrate a central solid component. Findings which are nonspecific and may be infectious and inflammatory versus reflecting metastatic disease involvement. Suggest  attention on short-term interval follow-up study in 3 months. 4. No evidence of metastatic disease within the abdomen or pelvis. 5. Mild wall thickening of an incompletely distended urinary bladder. Correlate with urinalysis to exclude cystitis. 6. 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 7. Aortic Atherosclerosis (ICD10-I70.0)."   2) history of melanoma, stage III -Diagnosed June 2012 -Right axillary excisional biopsy in July 2012 consistent with melanoma -Status post right axillary lymph node dissection, 0/17 nodes negative for malignancy -Received high-dose interferon in the ECOG 1609 clinical trial on 10/18/2011 -No evidence of recurrent or metastatic disease on imaging per notes from [place] with last scan performed on 10/23/2018   3) COVID-19 and influenza A infection diagnosed 10/27/2021. PLAN: -Discussed his guardant 360 results in details as noted above.  He does not have any targetable mutations that can be targeted with commercially available medications outside of a clinical trial. -He did have a PIK3CA mutation might have clinical trials available elsewhere to consider later in the course of treatment. -MSI high status not detected so cannot go with the primary immunotherapy only approach. -We discussed that after his lung infection from COVID-19 and influenza A resolved we would recommend starting on carboplatin/Alimta/pembrolizumab with growth factor support -Patient is status post SRS for his symptomatic brain lesions on 10/23/2021 . -radiation oncology has been consulted and shall be arranging for neuronavigational MRI and consideration of palliative RT to brain lesions- scheduled for 10/23/2021 -steroid taper per radiation oncology. -Goals of care were extensively discussed and the patient is clear that this is only palliative treatment and is not curative and that his prognosis remains guarded.   Follow-up We will  schedule for Port-A-Cath in couple of weeks Would plan to start carboplatin Alimta pembrolizumab palliative chemotherapy with growth factor support first week of December 2022 Chemo counseling for chemotherapy Return to clinic with Dr. Irene Limbo on cycle 1 day 1 of chemotherapy Continue follow-up with radiation oncology as per the recommendations.   . The total time spent in the appointment was 35 minutes and more than 50% was on counseling and direct patient care, ordering and coordination of systemic treatments   Sullivan Lone MD MS

## 2021-11-17 NOTE — Progress Notes (Addendum)
Christopher Randall presents today for follow-up and symptom management assessment. Received single SRS treatment on 10/23/2021  Past or anticipated interventions, if any, per medical oncology:  Under care of Dr. Sullivan Lone 11/05/2021 --Follow-up We will schedule for Port-A-Cath in couple of weeks Would plan to start carboplatin Alimta pembrolizumab palliative chemotherapy with growth factor support first week of December 2022 Chemo counseling for chemotherapy Return to clinic with Christopher Randall on cycle 1 day 1 of chemotherapy Continue follow-up with radiation oncology as per the recommendations  Dose of Decadron, if applicable: 4 mg PO daily (appetite has waned with most recent dose decrease)  Recent neurologic symptoms, if any:  Seizures: Patient denies Headaches: Reports generalized achiness to the right side/lateral/posterior side of his head  Nausea: Reports bouts of nausea after eating.  Wt Readings from Last 3 Encounters:  11/17/21 206 lb 3.2 oz (93.5 kg)  11/03/21 210 lb 11.2 oz (95.6 kg)  10/27/21 207 lb (93.9 kg)   Dizziness/ataxia: Reports some episodes of unstableness or trouble balancing on left leg to put on pants  Difficulty with hand coordination: Patient denies Focal numbness/weakness: Other than occasional left leg instability, patient denies Visual deficits/changes: Reports occasional shadows to his periphery when looking forward; states they resolve when he turns his head.  Confusion/Memory deficits: Reports occasionally having trouble finding the right word, which then causes him to get frustrated.   Additional Complaints / other details: Woke up from a nap this afternoon (about 1-1.5 hours ago) with active epitaxies. States pillow and side of shirt were saturated and observed several large clots. Reports noticeable hearing loss (both ears), states it has reduced noticeable tinnitus that was constant. Lower extremity edema observed, and patient reports discomfort with  ambulation/movement. Had an episode of diarrhea today

## 2021-11-17 NOTE — Telephone Encounter (Signed)
Call to pt per Dr Irene Limbo. Left message:DR Irene Limbo has sent your premedications to the pharmacy on file.  Please start the folic acid 1 mg p.o. daily and also over-the-counter B12 1000 mcg p.o. daily immediately to try to reduce side effects from the Alimta when it comes to mucositis.

## 2021-11-17 NOTE — Progress Notes (Addendum)
START ON PATHWAY REGIMEN - Non-Small Cell Lung     A cycle is every 21 days:     Pembrolizumab      Pemetrexed      Carboplatin   **Always confirm dose/schedule in your pharmacy ordering system**  Patient Characteristics: Stage IV Metastatic, Nonsquamous, Molecular Analysis Completed, Molecular Alteration Present and Targeted Therapy Exhausted OR EGFR Exon 20+ or KRAS G12C+ or HER2+ Present and No Prior Chemo/Immunotherapy OR No Alteration Present, Initial  Chemotherapy/Immunotherapy, PS = 0, 1, No Alteration Present, No Alteration Present, Candidate for Immunotherapy, PD-L1 Expression Positive 1-49% (TPS) / Negative / Not Tested / Awaiting Test Results and Immunotherapy Candidate Therapeutic Status: Stage IV Metastatic Histology: Nonsquamous Cell Broad Molecular Profiling Status: Molecular Analysis Completed Molecular Analysis Results: No Alteration Present ECOG Performance Status: 1 Chemotherapy/Immunotherapy Line of Therapy: Initial Chemotherapy/Immunotherapy EGFR Exons 18-21 Mutation Testing Status: Completed and Negative ALK Fusion/Rearrangement Testing Status: Completed and Negative BRAF V600 Mutation Testing Status: Completed and Negative KRAS G12C Mutation Testing Status: Completed and Negative MET Exon 14 Mutation Testing Status: Completed and Negative RET Fusion/Rearrangement Testing Status: Completed and Negative HER2 Mutation Testing Status: Completed and Negative NTRK Fusion/Rearrangement Testing Status: Completed and Negative ROS1 Fusion/Rearrangement Testing Status: Completed and Negative Immunotherapy Candidate Status: Candidate for Immunotherapy PD-L1 Expression Status: PD-L1 Negative Intent of Therapy: Non-Curative / Palliative Intent, Discussed with Patient

## 2021-11-17 NOTE — Progress Notes (Signed)
Radiation Oncology         [phone]) [phone] ________________________________  Name: Christopher Randall MRN: [id number]  Date: 11/17/2021  DOB: 05-24-1961  Follow-Up Visit Note  Outpatient  CC: Allwardt, Randa Evens, PA-C  Arrien, Jimmy Picket*  Diagnosis and Prior Radiotherapy:    ICD-10-CM   1. Brain metastasis ([place])  C79.31     2. Brain metastases ([place])  C79.31       CHIEF COMPLAINT: Leg swelling and bloody nose  Narrative:  The patient returns today for early follow-up due to symptoms.   Received single SRS treatment on 10/23/2021 and shortly thereafter he was diagnosed with COVID and flu.  He presents today with new issues including's progressive swelling of the bilateral lower extremities with weeping of the skin, acute epistaxis, and mouth sores despite a recent regimen of fluconazole.  Neurologically he is stable and denies any new headaches though he does have some mild headaches that are stable  Past or anticipated interventions, if any, per medical oncology:  Under care of Dr. Sullivan Lone 11/05/2021 --Follow-up We will schedule for Port-A-Cath in couple of weeks Would plan to start carboplatin [place] pembrolizumab palliative chemotherapy with growth factor support first week of December 2022 Chemo counseling for chemotherapy Return to clinic with Dr. Irene Limbo on cycle 1 day 1 of chemotherapy Continue follow-up with radiation oncology as per the recommendations  Dose of Decadron, if applicable: 4 mg PO daily (appetite has waned with most recent dose decrease)  Recent neurologic symptoms, if any:  Seizures: Patient denies Headaches: Reports generalized achiness to the right side/lateral/posterior side of his head  Nausea: Reports bouts of nausea after eating.  Wt Readings from Last 3 Encounters:  11/17/21 206 lb 3.2 oz (93.5 kg)  11/03/21 210 lb 11.2 oz (95.6 kg)  10/27/21 207 lb (93.9 kg)   Dizziness/ataxia: Reports some episodes of unstableness or trouble balancing on  left leg to put on pants  Difficulty with hand coordination: Patient denies Focal numbness/weakness: Other than occasional left leg instability, patient denies Visual deficits/changes: Reports occasional shadows to his periphery when looking forward; states they resolve when he turns his head.  Confusion/Memory deficits: Reports occasionally having trouble finding the right word, which then causes him to get frustrated.   Additional Complaints / other details: Woke up from a nap this afternoon (about 1-1.5 hours ago) with active epistaxis. states pillow and side of shirt were saturated and observed several large clots. Reports noticeable hearing loss (both ears), CHRONIC, states it has reduced noticeable tinnitus that was constant. Lower extremity edema observed, and patient reports discomfort with ambulation/movement.    ALLERGIES:  is allergic to iopamidol, iodinated diagnostic agents, and iodine-131.  Meds: Current Outpatient Medications  Medication Sig Dispense Refill   dexamethasone (DECADRON) 4 MG tablet Take 1 tablet (4 mg total) by mouth 2 (two) times daily with a meal. TAPER PLAN: Take 1 tablet TID through Nov 11. Then take 1 tablet BID through Nov 25. Then take 1 tablet daily through Dec 9. Then take 1/2 tablet daily through Dec 16. Then take 1/2 every other day through Dec 23. 48 tablet 0   dexamethasone (DECADRON) 4 MG tablet Take 1 tab every 12 hours the day before pemetrexed chemo, then take 2 tabs once a day for 3 days starting the day after cisplatin. 30 tablet 1   Diclofenac Sodium 1.5 % SOLN Place one application on to the skin 2x daily. (Patient taking differently: Place one application on to the skin twice  a day as needed for muscle cramps or aches) 1.5 Bottle 1   fluconazole (DIFLUCAN) 100 MG tablet Take 2 tablets today, then 1 tablet daily x 20 more days. 35 tablet 0   folic acid (FOLVITE) 1 MG tablet Take 1 tablet (1 mg total) by mouth daily. Start 7 days before pemetrexed  chemotherapy. Continue until 21 days after pemetrexed completed. 100 tablet 3   lidocaine-prilocaine (EMLA) cream Apply to affected area once 30 g 3   Multiple Vitamins-Minerals (ICAPS AREDS 2 PO) Take 1 capsule by mouth daily.     nicotine polacrilex (COMMIT) 4 MG lozenge Take 4 mg by mouth 2 (two) times daily as needed for smoking cessation.     omeprazole (PRILOSEC) 20 MG capsule Take daily while on steroids. 60 capsule 0   ondansetron (ZOFRAN) 8 MG tablet Take 1 tablet (8 mg total) by mouth 2 (two) times daily as needed (Nausea or vomiting). Start if needed on the third day after cisplatin. 30 tablet 1   oseltamivir (TAMIFLU) 75 MG capsule Take 1 capsule (75 mg total) by mouth 2 (two) times daily. 10 capsule 0   OVER THE COUNTER MEDICATION Natural Calm Magnesium Powder: Dissolve 1 teaspoon into 8 oz beverage and drink daily     prochlorperazine (COMPAZINE) 10 MG tablet Take 1 tablet (10 mg total) by mouth every 6 (six) hours as needed (Nausea or vomiting). 30 tablet 1   No current facility-administered medications for this encounter.    Physical Findings: The patient is in no acute distress. Patient is alert and oriented.  height is 5\' 9"  (1.753 m) and weight is 206 lb 3.2 oz (93.5 kg). His temperature is 96.8 F (36 C) (abnormal). His blood pressure is 128/93 (abnormal) and his pulse is 121 (abnormal). His respiration is 20 and oxygen saturation is 98%. Marland Kitchen    HEENT: No oral thrush, mild sores in mouth that may be secondary to healing from recent thrush.  Epistaxis present. Extremities: Weeping severe pitting edema bilaterally Neuro: Alert and oriented and in no acute distress.  Ambulatory.  Lab Findings: Lab Results  Component Value Date   WBC 16.0 (H) 11/03/2021   HGB 12.8 (L) 11/03/2021   HCT 39.0 11/03/2021   MCV 94.7 11/03/2021   PLT 203 11/03/2021    Radiographic Findings: DG Chest 2 View  Result Date: 10/27/2021 CLINICAL DATA:  Productive cough. Congestion. Shortness of  breath for 1 day. History of right-sided lung cancer. EXAM: CHEST - 2 VIEW COMPARISON:  10/09/2021 FINDINGS: Midline trachea. Normal heart size. Right-sided mediastinal and hilar adenopathy are grossly similar to on the prior CT. No pleural effusion or pneumothorax. Right middle lobe opacification secondary to mass and postobstructive pneumonitis, grossly similar to on the prior CT. Clear left lung. Right axillary surgical clips. IMPRESSION: Grossly similar right middle lobe/hilar mass with postobstructive pneumonitis. Mediastinal and right hilar adenopathy, as on CT. No acute superimposed process. Electronically Signed   By: Abigail Miyamoto M.D.   On: 10/27/2021 09:46    Impression/Plan:   Patient has multiple issues as above.  Recent COVID and influenza infections as well as history of metastatic cancer.   Regarding his mouth sores, I do not see any evidence of oral thrush and I do not recommend resuming antifungals at this time.  His mucosa may be healing from previous oral thrush or other issues.  Regarding his epistaxis, I am not sure of the etiology.  I believe he would benefit from repeat labs and management as  appropriate.  Our symptom management clinic was notified but due to his tachycardia they recommend that he go to the emergency room.  Nursing tried to facilitate this but the patient and his wife left against medical advice.  Nursing will call him tomorrow to see how he is doing as outpatient follow-up is indicated.  For his lower extremity edema we will expedite his steroid taper as I believe the steroids may be contributing to this condition.  He is currently taking 4 mg tablets of dexamethasone; as of November 30 I instructed him to take half a tablet daily and then on December 3 to start taking half a tablet every other day through December 11.  Then he can stop dexamethasone.  I notified Dr Irene Limbo of this modification as he will need to make sure that the patient still takes dexamethasone as  indicated around the times of his chemotherapy.  Regarding surveillance of his brain after radiosurgery, we will schedule a follow-up MRI in approximately 2 months and he will follow-up with neurosurgery at that time for the results.  On date of service, in total, I spent 25 minutes on this encounter. Patient was seen in person.  _____________________________________   Eppie Gibson, MD

## 2021-11-18 ENCOUNTER — Telehealth: Payer: Self-pay

## 2021-11-18 NOTE — Telephone Encounter (Signed)
Noted and agreed, thank you.

## 2021-11-18 NOTE — Telephone Encounter (Signed)
Nurse Assessment Nurse: Maebelle Munroe, RN, [name] Date/Time (Eastern Time): 11/17/2021 3:05:27 PM Confirm and document reason for call. If symptomatic, describe symptoms. ---Caller states pt. has nose bleed that started this am. Has blood clots coming out. Filling up gauze every 3-4 minutes. Has leaking areas on calves today. Is in Oncologist office.  Does the patient have any new or worsening symptoms? ---Yes Will a triage be completed? ---Yes Related visit to physician within the last 2 weeks? ---No Does the PT have any chronic conditions? (i.e. diabetes, asthma, this includes High risk factors for pregnancy, etc.) --Yes List chronic conditions. ---Adenocarcinoma Stage 4 Is this a behavioral health or substance abuse call? ---No  Nosebleed Patient sounds very sick or weak to the triager Maebelle Munroe, RN, [name] 11/17/2021 3:08:48 PM  11/17/2021 3:02:58 PM Send to Urgent Daron Offer, [place] 11/17/2021 3:14:03 PM Go to ED Now (or PCP triage) Yes Lipps, RN, [name]  Wife taking pt. to ED, he was passing large blood clots

## 2021-11-19 ENCOUNTER — Encounter: Payer: Self-pay | Admitting: Hematology

## 2021-11-19 ENCOUNTER — Telehealth: Payer: Self-pay

## 2021-11-19 ENCOUNTER — Other Ambulatory Visit: Payer: Self-pay

## 2021-11-19 ENCOUNTER — Inpatient Hospital Stay: Payer: BC Managed Care – PPO | Attending: Hematology

## 2021-11-19 ENCOUNTER — Other Ambulatory Visit: Payer: Self-pay | Admitting: Hematology

## 2021-11-19 DIAGNOSIS — Z5189 Encounter for other specified aftercare: Secondary | ICD-10-CM | POA: Insufficient documentation

## 2021-11-19 DIAGNOSIS — I700 Atherosclerosis of aorta: Secondary | ICD-10-CM | POA: Insufficient documentation

## 2021-11-19 DIAGNOSIS — E878 Other disorders of electrolyte and fluid balance, not elsewhere classified: Secondary | ICD-10-CM | POA: Insufficient documentation

## 2021-11-19 DIAGNOSIS — C7931 Secondary malignant neoplasm of brain: Secondary | ICD-10-CM | POA: Insufficient documentation

## 2021-11-19 DIAGNOSIS — M7989 Other specified soft tissue disorders: Secondary | ICD-10-CM | POA: Insufficient documentation

## 2021-11-19 DIAGNOSIS — Z5111 Encounter for antineoplastic chemotherapy: Secondary | ICD-10-CM | POA: Insufficient documentation

## 2021-11-19 DIAGNOSIS — Z8616 Personal history of COVID-19: Secondary | ICD-10-CM | POA: Insufficient documentation

## 2021-11-19 DIAGNOSIS — R000 Tachycardia, unspecified: Secondary | ICD-10-CM | POA: Insufficient documentation

## 2021-11-19 DIAGNOSIS — C342 Malignant neoplasm of middle lobe, bronchus or lung: Secondary | ICD-10-CM | POA: Insufficient documentation

## 2021-11-19 DIAGNOSIS — Z801 Family history of malignant neoplasm of trachea, bronchus and lung: Secondary | ICD-10-CM | POA: Insufficient documentation

## 2021-11-19 DIAGNOSIS — Z8249 Family history of ischemic heart disease and other diseases of the circulatory system: Secondary | ICD-10-CM | POA: Insufficient documentation

## 2021-11-19 DIAGNOSIS — Z5112 Encounter for antineoplastic immunotherapy: Secondary | ICD-10-CM | POA: Insufficient documentation

## 2021-11-19 DIAGNOSIS — Z79899 Other long term (current) drug therapy: Secondary | ICD-10-CM | POA: Insufficient documentation

## 2021-11-19 DIAGNOSIS — Z7289 Other problems related to lifestyle: Secondary | ICD-10-CM | POA: Insufficient documentation

## 2021-11-19 DIAGNOSIS — D696 Thrombocytopenia, unspecified: Secondary | ICD-10-CM | POA: Insufficient documentation

## 2021-11-19 DIAGNOSIS — D72829 Elevated white blood cell count, unspecified: Secondary | ICD-10-CM | POA: Insufficient documentation

## 2021-11-19 DIAGNOSIS — F17210 Nicotine dependence, cigarettes, uncomplicated: Secondary | ICD-10-CM | POA: Insufficient documentation

## 2021-11-19 NOTE — Telephone Encounter (Signed)
Error

## 2021-11-19 NOTE — Telephone Encounter (Addendum)
Patient's wife called in stating Christopher Randall was at the oncologist yesterday, while he was having the bloody nose and leaking from the calves. While Christopher Randall was there they were telling him to follow up with Christopher Randall as they believe that she needed to address the issues which resulted in Christopher Randall triaging the patient. After talking with the oncologist, Dr.Squire today he discussed that he would like for Christopher Randall to treat the pitting edema as it is something that is not caused by any of his cancer medications and would need to be evaluated by her. Dr.Squire did mention to Christopher Randall that he would like to have a discussion about the patients health.   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

## 2021-11-20 ENCOUNTER — Other Ambulatory Visit (HOSPITAL_COMMUNITY): Payer: Self-pay

## 2021-11-20 ENCOUNTER — Encounter: Payer: Self-pay | Admitting: Hematology

## 2021-11-20 ENCOUNTER — Inpatient Hospital Stay: Payer: BC Managed Care – PPO

## 2021-11-20 ENCOUNTER — Inpatient Hospital Stay: Payer: BC Managed Care – PPO | Admitting: Hematology

## 2021-11-20 ENCOUNTER — Encounter: Payer: Self-pay | Admitting: Radiation Oncology

## 2021-11-20 VITALS — BP 151/105 | HR 92 | Temp 97.9°F | Resp 18 | Wt 204.0 lb

## 2021-11-20 DIAGNOSIS — Z8616 Personal history of COVID-19: Secondary | ICD-10-CM | POA: Diagnosis not present

## 2021-11-20 DIAGNOSIS — C3490 Malignant neoplasm of unspecified part of unspecified bronchus or lung: Secondary | ICD-10-CM

## 2021-11-20 DIAGNOSIS — F17210 Nicotine dependence, cigarettes, uncomplicated: Secondary | ICD-10-CM | POA: Diagnosis not present

## 2021-11-20 DIAGNOSIS — Z79899 Other long term (current) drug therapy: Secondary | ICD-10-CM | POA: Diagnosis not present

## 2021-11-20 DIAGNOSIS — I700 Atherosclerosis of aorta: Secondary | ICD-10-CM | POA: Diagnosis not present

## 2021-11-20 DIAGNOSIS — Z801 Family history of malignant neoplasm of trachea, bronchus and lung: Secondary | ICD-10-CM | POA: Diagnosis not present

## 2021-11-20 DIAGNOSIS — C342 Malignant neoplasm of middle lobe, bronchus or lung: Secondary | ICD-10-CM | POA: Diagnosis not present

## 2021-11-20 DIAGNOSIS — C7931 Secondary malignant neoplasm of brain: Secondary | ICD-10-CM

## 2021-11-20 DIAGNOSIS — D72829 Elevated white blood cell count, unspecified: Secondary | ICD-10-CM | POA: Diagnosis not present

## 2021-11-20 DIAGNOSIS — Z7189 Other specified counseling: Secondary | ICD-10-CM

## 2021-11-20 DIAGNOSIS — E878 Other disorders of electrolyte and fluid balance, not elsewhere classified: Secondary | ICD-10-CM | POA: Diagnosis not present

## 2021-11-20 DIAGNOSIS — D696 Thrombocytopenia, unspecified: Secondary | ICD-10-CM | POA: Diagnosis not present

## 2021-11-20 DIAGNOSIS — Z5111 Encounter for antineoplastic chemotherapy: Secondary | ICD-10-CM | POA: Diagnosis present

## 2021-11-20 DIAGNOSIS — Z7289 Other problems related to lifestyle: Secondary | ICD-10-CM | POA: Diagnosis not present

## 2021-11-20 DIAGNOSIS — Z5112 Encounter for antineoplastic immunotherapy: Secondary | ICD-10-CM | POA: Diagnosis present

## 2021-11-20 DIAGNOSIS — Z5189 Encounter for other specified aftercare: Secondary | ICD-10-CM | POA: Diagnosis not present

## 2021-11-20 DIAGNOSIS — Z8249 Family history of ischemic heart disease and other diseases of the circulatory system: Secondary | ICD-10-CM | POA: Diagnosis not present

## 2021-11-20 DIAGNOSIS — M7989 Other specified soft tissue disorders: Secondary | ICD-10-CM

## 2021-11-20 DIAGNOSIS — R000 Tachycardia, unspecified: Secondary | ICD-10-CM | POA: Diagnosis not present

## 2021-11-20 LAB — CBC WITH DIFFERENTIAL (CANCER CENTER ONLY)
Abs Immature Granulocytes: 1.1 10*3/uL — ABNORMAL HIGH (ref 0.00–0.07)
Basophils Absolute: 0.1 10*3/uL (ref 0.0–0.1)
Basophils Relative: 0 %
Eosinophils Absolute: 0.2 10*3/uL (ref 0.0–0.5)
Eosinophils Relative: 1 %
HCT: 35.8 % — ABNORMAL LOW (ref 39.0–52.0)
Hemoglobin: 12.4 g/dL — ABNORMAL LOW (ref 13.0–17.0)
Immature Granulocytes: 5 %
Lymphocytes Relative: 6 %
Lymphs Abs: 1.3 10*3/uL (ref 0.7–4.0)
MCH: 30.8 pg (ref 26.0–34.0)
MCHC: 34.6 g/dL (ref 30.0–36.0)
MCV: 89.1 fL (ref 80.0–100.0)
Monocytes Absolute: 1.1 10*3/uL — ABNORMAL HIGH (ref 0.1–1.0)
Monocytes Relative: 5 %
Neutro Abs: 18.7 10*3/uL — ABNORMAL HIGH (ref 1.7–7.7)
Neutrophils Relative %: 83 %
Platelet Count: 62 10*3/uL — ABNORMAL LOW (ref 150–400)
RBC: 4.02 MIL/uL — ABNORMAL LOW (ref 4.22–5.81)
RDW: 14.6 % (ref 11.5–15.5)
WBC Count: 22.4 10*3/uL — ABNORMAL HIGH (ref 4.0–10.5)
nRBC: 0 % (ref 0.0–0.2)

## 2021-11-20 LAB — CMP (CANCER CENTER ONLY)
ALT: 53 U/L — ABNORMAL HIGH (ref 0–44)
AST: 28 U/L (ref 15–41)
Albumin: 2.8 g/dL — ABNORMAL LOW (ref 3.5–5.0)
Alkaline Phosphatase: 74 U/L (ref 38–126)
Anion gap: 13 (ref 5–15)
BUN: 15 mg/dL (ref 6–20)
CO2: 23 mmol/L (ref 22–32)
Calcium: 8.3 mg/dL — ABNORMAL LOW (ref 8.9–10.3)
Chloride: 95 mmol/L — ABNORMAL LOW (ref 98–111)
Creatinine: 0.72 mg/dL (ref 0.61–1.24)
GFR, Estimated: >60 mL/min (ref >60)
Glucose, Bld: 120 mg/dL — ABNORMAL HIGH (ref 70–99)
Potassium: 4 mmol/L (ref 3.5–5.1)
Sodium: 131 mmol/L — ABNORMAL LOW (ref 135–145)
Total Bilirubin: 0.5 mg/dL (ref 0.3–1.2)
Total Protein: 6.1 g/dL — ABNORMAL LOW (ref 6.5–8.1)

## 2021-11-20 MED ORDER — SODIUM CHLORIDE 0.9 % IV SOLN
150.0000 mg | Freq: Once | INTRAVENOUS | Status: AC
  Administered 2021-11-20: 150 mg via INTRAVENOUS
  Filled 2021-11-20: qty 150

## 2021-11-20 MED ORDER — SODIUM CHLORIDE 0.9 % IV SOLN: Freq: Once | INTRAVENOUS | Status: AC

## 2021-11-20 MED ORDER — SODIUM CHLORIDE 0.9 % IV SOLN
200.0000 mg | Freq: Once | INTRAVENOUS | Status: AC
  Administered 2021-11-20: 200 mg via INTRAVENOUS
  Filled 2021-11-20: qty 8

## 2021-11-20 MED ORDER — SODIUM CHLORIDE 0.9 % IV SOLN
10.0000 mg | Freq: Once | INTRAVENOUS | Status: AC
  Administered 2021-11-20: 10 mg via INTRAVENOUS
  Filled 2021-11-20: qty 10

## 2021-11-20 MED ORDER — FUROSEMIDE 20 MG PO TABS: 20.0000 mg | ORAL_TABLET | Freq: Every day | ORAL | 0 refills | Status: DC

## 2021-11-20 MED ORDER — PALONOSETRON HCL INJECTION 0.25 MG/5ML
0.2500 mg | Freq: Once | INTRAVENOUS | Status: AC
  Administered 2021-11-20: 0.25 mg via INTRAVENOUS
  Filled 2021-11-20: qty 5

## 2021-11-20 MED ORDER — LIDOCAINE VISCOUS HCL 2 % MT SOLN
OROMUCOSAL | 2 refills | Status: AC
  Filled 2021-11-20: qty 240, 8d supply, fill #0

## 2021-11-20 MED ORDER — SODIUM CHLORIDE 0.9 % IV SOLN
300.0000 mg | Freq: Once | INTRAVENOUS | Status: AC
  Administered 2021-11-20: 300 mg via INTRAVENOUS
  Filled 2021-11-20: qty 30

## 2021-11-20 NOTE — Telephone Encounter (Signed)
Notified patient of message,he voices understanding he will call on Monday if needed

## 2021-11-20 NOTE — Progress Notes (Signed)
Ok to treat today with low platelet count per Dr Irene Limbo

## 2021-11-20 NOTE — Patient Instructions (Addendum)
[facility]   Discharge Instructions: Thank you for choosing [facility] to provide your oncology and hematology care.   If you have a lab appointment with the [facility] , please go directly to the [facility] and check in at the registration area.   Wear comfortable clothing and clothing appropriate for easy access to any Portacath or PICC line.   We strive to give you quality time with your provider. You may need to reschedule your appointment if you arrive late (15 or more minutes).  Arriving late affects you and other patients whose appointments are after yours.  Also, if you miss three or more appointments without notifying the office, you may be dismissed from the clinic at the provider's discretion.      For prescription refill requests, have your pharmacy contact our office and allow 72 hours for refills to be completed.    Today you received the following chemotherapy and/or immunotherapy agents: pembrolizumab and carboplatin.      To help prevent nausea and vomiting after your treatment, we encourage you to take your nausea medication as directed.  BELOW ARE SYMPTOMS THAT SHOULD BE REPORTED IMMEDIATELY: *FEVER GREATER THAN 100.4 F (38 C) OR HIGHER *CHILLS OR SWEATING *NAUSEA AND VOMITING THAT IS NOT CONTROLLED WITH YOUR NAUSEA MEDICATION *UNUSUAL SHORTNESS OF BREATH *UNUSUAL BRUISING OR BLEEDING *URINARY PROBLEMS (pain or burning when urinating, or frequent urination) *BOWEL PROBLEMS (unusual diarrhea, constipation, pain near the anus) TENDERNESS IN MOUTH AND THROAT WITH OR WITHOUT PRESENCE OF ULCERS (sore throat, sores in mouth, or a toothache) UNUSUAL RASH, SWELLING OR PAIN  UNUSUAL VAGINAL DISCHARGE OR ITCHING   Items with * indicate a potential emergency and should be followed up as soon as possible or go to the Emergency Department if any problems should occur.  Please show the CHEMOTHERAPY ALERT CARD or IMMUNOTHERAPY  ALERT CARD at check-in to the Emergency Department and triage nurse.  Should you have questions after your visit or need to cancel or reschedule your appointment, please contact [facility]  Dept: [phone]  and follow the prompts.  Office hours are 8:00 a.m. to 4:30 p.m. Monday - Friday. Please note that voicemails left after 4:00 p.m. may not be returned until the following business day.  We are closed weekends and major holidays. You have access to a nurse at all times for urgent questions. Please call the main number to the clinic Dept: [phone] and follow the prompts.   For any non-urgent questions, you may also contact your provider using MyChart. We now offer e-Visits for anyone 43 and older to request care online for non-urgent symptoms. For details visit [email or web address].   Also download the MyChart app! Go to the app store, search "MyChart", open the app, select [facility], and log in with your MyChart username and password.  Due to Covid, a mask is required upon entering the hospital/clinic. If you do not have a mask, one will be given to you upon arrival. For doctor visits, patients may have 1 support person aged 35 or older with them. For treatment visits, patients cannot have anyone with them due to current Covid guidelines and our immunocompromised population.   Pembrolizumab injection What is this medication? PEMBROLIZUMAB (pem broe liz ue mab) is a monoclonal antibody. It is used to treat certain types of cancer. This medicine may be used for other purposes; ask your health care provider or pharmacist if you have questions. COMMON BRAND  NAME(S): Keytruda What should I tell my care team before I take this medication? They need to know if you have any of these conditions: autoimmune diseases like Crohn's disease, ulcerative colitis, or lupus have had or planning to have an allogeneic stem cell transplant (uses someone else's stem  cells) history of organ transplant history of chest radiation nervous system problems like myasthenia gravis or Guillain-Barre syndrome an unusual or allergic reaction to pembrolizumab, other medicines, foods, dyes, or preservatives pregnant or trying to get pregnant breast-feeding How should I use this medication? This medicine is for infusion into a vein. It is given by a health care professional in a hospital or clinic setting. A special MedGuide will be given to you before each treatment. Be sure to read this information carefully each time. Talk to your pediatrician regarding the use of this medicine in children. While this drug may be prescribed for children as young as 6 months for selected conditions, precautions do apply. Overdosage: If you think you have taken too much of this medicine contact a poison control center or emergency room at once. NOTE: This medicine is only for you. Do not share this medicine with others. What if I miss a dose? It is important not to miss your dose. Call your doctor or health care professional if you are unable to keep an appointment. What may interact with this medication? Interactions have not been studied. This list may not describe all possible interactions. Give your health care provider a list of all the medicines, herbs, non-prescription drugs, or dietary supplements you use. Also tell them if you smoke, drink alcohol, or use illegal drugs. Some items may interact with your medicine. What should I watch for while using this medication? Your condition will be monitored carefully while you are receiving this medicine. You may need blood work done while you are taking this medicine. Do not become pregnant while taking this medicine or for 4 months after stopping it. Women should inform their doctor if they wish to become pregnant or think they might be pregnant. There is a potential for serious side effects to an unborn child. Talk to your health care  professional or pharmacist for more information. Do not breast-feed an infant while taking this medicine or for 4 months after the last dose. What side effects may I notice from receiving this medication? Side effects that you should report to your doctor or health care professional as soon as possible: allergic reactions like skin rash, itching or hives, swelling of the face, lips, or tongue bloody or black, tarry breathing problems changes in vision chest pain chills confusion constipation cough diarrhea dizziness or feeling faint or lightheaded fast or irregular heartbeat fever flushing joint pain low blood counts - this medicine may decrease the number of white blood cells, red blood cells and platelets. You may be at increased risk for infections and bleeding. muscle pain muscle weakness pain, tingling, numbness in the hands or feet persistent headache redness, blistering, peeling or loosening of the skin, including inside the mouth signs and symptoms of high blood sugar such as dizziness; dry mouth; dry skin; fruity breath; nausea; stomach pain; increased hunger or thirst; increased urination signs and symptoms of kidney injury like trouble passing urine or change in the amount of urine signs and symptoms of liver injury like dark urine, light-colored stools, loss of appetite, nausea, right upper belly pain, yellowing of the eyes or skin sweating swollen lymph nodes weight loss Side effects that usually do  not require medical attention (report to your doctor or health care professional if they continue or are bothersome): decreased appetite hair loss tiredness This list may not describe all possible side effects. Call your doctor for medical advice about side effects. You may report side effects to FDA at 1-800-FDA-1088. Where should I keep my medication? This drug is given in a hospital or clinic and will not be stored at home. NOTE: This sheet is a summary. It may not  cover all possible information. If you have questions about this medicine, talk to your doctor, pharmacist, or health care provider.  2022 Elsevier/Gold Standard (2021-08-25 00:00:00)  Carboplatin injection What is this medication? CARBOPLATIN (KAR boe pla tin) is a chemotherapy drug. It targets fast dividing cells, like cancer cells, and causes these cells to die. This medicine is used to treat ovarian cancer and many other cancers. This medicine may be used for other purposes; ask your health care provider or pharmacist if you have questions. COMMON BRAND NAME(S): Paraplatin What should I tell my care team before I take this medication? They need to know if you have any of these conditions: blood disorders hearing problems kidney disease recent or ongoing radiation therapy an unusual or allergic reaction to carboplatin, cisplatin, other chemotherapy, other medicines, foods, dyes, or preservatives pregnant or trying to get pregnant breast-feeding How should I use this medication? This drug is usually given as an infusion into a vein. It is administered in a hospital or clinic by a specially trained health care professional. Talk to your pediatrician regarding the use of this medicine in children. Special care may be needed. Overdosage: If you think you have taken too much of this medicine contact a poison control center or emergency room at once. NOTE: This medicine is only for you. Do not share this medicine with others. What if I miss a dose? It is important not to miss a dose. Call your doctor or health care professional if you are unable to keep an appointment. What may interact with this medication? medicines for seizures medicines to increase blood counts like filgrastim, pegfilgrastim, sargramostim some antibiotics like amikacin, gentamicin, neomycin, streptomycin, tobramycin vaccines Talk to your doctor or health care professional before taking any of these  medicines: acetaminophen aspirin ibuprofen ketoprofen naproxen This list may not describe all possible interactions. Give your health care provider a list of all the medicines, herbs, non-prescription drugs, or dietary supplements you use. Also tell them if you smoke, drink alcohol, or use illegal drugs. Some items may interact with your medicine. What should I watch for while using this medication? Your condition will be monitored carefully while you are receiving this medicine. You will need important blood work done while you are taking this medicine. This drug may make you feel generally unwell. This is not uncommon, as chemotherapy can affect healthy cells as well as cancer cells. Report any side effects. Continue your course of treatment even though you feel ill unless your doctor tells you to stop. In some cases, you may be given additional medicines to help with side effects. Follow all directions for their use. Call your doctor or health care professional for advice if you get a fever, chills or sore throat, or other symptoms of a cold or flu. Do not treat yourself. This drug decreases your body's ability to fight infections. Try to avoid being around people who are sick. This medicine may increase your risk to bruise or bleed. Call your doctor or health care professional  if you notice any unusual bleeding. Be careful brushing and flossing your teeth or using a toothpick because you may get an infection or bleed more easily. If you have any dental work done, tell your dentist you are receiving this medicine. Avoid taking products that contain aspirin, acetaminophen, ibuprofen, naproxen, or ketoprofen unless instructed by your doctor. These medicines may hide a fever. Do not become pregnant while taking this medicine. Women should inform their doctor if they wish to become pregnant or think they might be pregnant. There is a potential for serious side effects to an unborn child. Talk to your  health care professional or pharmacist for more information. Do not breast-feed an infant while taking this medicine. What side effects may I notice from receiving this medication? Side effects that you should report to your doctor or health care professional as soon as possible: allergic reactions like skin rash, itching or hives, swelling of the face, lips, or tongue signs of infection - fever or chills, cough, sore throat, pain or difficulty passing urine signs of decreased platelets or bleeding - bruising, pinpoint red spots on the skin, black, tarry stools, nosebleeds signs of decreased red blood cells - unusually weak or tired, fainting spells, lightheadedness breathing problems changes in hearing changes in vision chest pain high blood pressure low blood counts - This drug may decrease the number of white blood cells, red blood cells and platelets. You may be at increased risk for infections and bleeding. nausea and vomiting pain, swelling, redness or irritation at the injection site pain, tingling, numbness in the hands or feet problems with balance, talking, walking trouble passing urine or change in the amount of urine Side effects that usually do not require medical attention (report to your doctor or health care professional if they continue or are bothersome): hair loss loss of appetite metallic taste in the mouth or changes in taste This list may not describe all possible side effects. Call your doctor for medical advice about side effects. You may report side effects to FDA at 1-800-FDA-1088. Where should I keep my medication? This drug is given in a hospital or clinic and will not be stored at home. NOTE: This sheet is a summary. It may not cover all possible information. If you have questions about this medicine, talk to your doctor, pharmacist, or health care provider.  2022 Elsevier/Gold Standard (2008-05-15 00:00:00)

## 2021-11-21 ENCOUNTER — Other Ambulatory Visit (HOSPITAL_COMMUNITY): Payer: Self-pay

## 2021-11-23 ENCOUNTER — Telehealth: Payer: Self-pay | Admitting: Hematology

## 2021-11-23 ENCOUNTER — Inpatient Hospital Stay: Payer: BC Managed Care – PPO

## 2021-11-23 VITALS — BP 147/78 | HR 113 | Temp 98.0°F | Resp 18

## 2021-11-23 DIAGNOSIS — C3490 Malignant neoplasm of unspecified part of unspecified bronchus or lung: Secondary | ICD-10-CM

## 2021-11-23 DIAGNOSIS — Z7189 Other specified counseling: Secondary | ICD-10-CM

## 2021-11-23 DIAGNOSIS — C342 Malignant neoplasm of middle lobe, bronchus or lung: Secondary | ICD-10-CM | POA: Diagnosis not present

## 2021-11-23 DIAGNOSIS — C7931 Secondary malignant neoplasm of brain: Secondary | ICD-10-CM

## 2021-11-23 MED ORDER — PEGFILGRASTIM-CBQV 6 MG/0.6ML ~~LOC~~ SOSY
6.0000 mg | PREFILLED_SYRINGE | Freq: Once | SUBCUTANEOUS | Status: AC
  Administered 2021-11-23: 6 mg via SUBCUTANEOUS
  Filled 2021-11-23: qty 0.6

## 2021-11-23 NOTE — Telephone Encounter (Signed)
Scheduled follow-up appointments per 12/2 los. Patient is aware.

## 2021-11-24 ENCOUNTER — Inpatient Hospital Stay: Payer: BC Managed Care – PPO

## 2021-11-24 ENCOUNTER — Other Ambulatory Visit: Payer: BC Managed Care – PPO

## 2021-11-24 ENCOUNTER — Inpatient Hospital Stay (HOSPITAL_BASED_OUTPATIENT_CLINIC_OR_DEPARTMENT_OTHER): Payer: BC Managed Care – PPO | Admitting: Internal Medicine

## 2021-11-24 ENCOUNTER — Other Ambulatory Visit: Payer: Self-pay

## 2021-11-24 ENCOUNTER — Ambulatory Visit: Payer: Self-pay | Admitting: Radiation Oncology

## 2021-11-24 VITALS — BP 151/93 | HR 115 | Temp 97.5°F | Resp 18 | Ht 69.0 in | Wt 204.8 lb

## 2021-11-24 DIAGNOSIS — I63133 Cerebral infarction due to embolism of bilateral carotid arteries: Secondary | ICD-10-CM | POA: Diagnosis not present

## 2021-11-24 DIAGNOSIS — C7931 Secondary malignant neoplasm of brain: Secondary | ICD-10-CM

## 2021-11-24 DIAGNOSIS — I213 ST elevation (STEMI) myocardial infarction of unspecified site: Secondary | ICD-10-CM | POA: Diagnosis not present

## 2021-11-24 DIAGNOSIS — C3490 Malignant neoplasm of unspecified part of unspecified bronchus or lung: Secondary | ICD-10-CM

## 2021-11-24 LAB — CMP (CANCER CENTER ONLY)
ALT: 38 U/L (ref 0–44)
AST: 38 U/L (ref 15–41)
Albumin: 2.6 g/dL — ABNORMAL LOW (ref 3.5–5.0)
Alkaline Phosphatase: 87 U/L (ref 38–126)
Anion gap: 15 (ref 5–15)
BUN: 20 mg/dL (ref 6–20)
CO2: 23 mmol/L (ref 22–32)
Calcium: 8.2 mg/dL — ABNORMAL LOW (ref 8.9–10.3)
Chloride: 89 mmol/L — ABNORMAL LOW (ref 98–111)
Creatinine: 0.82 mg/dL (ref 0.61–1.24)
GFR, Estimated: >60 mL/min (ref >60)
Glucose, Bld: 101 mg/dL — ABNORMAL HIGH (ref 70–99)
Potassium: 4.3 mmol/L (ref 3.5–5.1)
Sodium: 127 mmol/L — ABNORMAL LOW (ref 135–145)
Total Bilirubin: 0.6 mg/dL (ref 0.3–1.2)
Total Protein: 5.8 g/dL — ABNORMAL LOW (ref 6.5–8.1)

## 2021-11-24 LAB — CBC WITH DIFFERENTIAL (CANCER CENTER ONLY)
Abs Immature Granulocytes: 9.43 10*3/uL — ABNORMAL HIGH (ref 0.00–0.07)
Basophils Absolute: 0 10*3/uL (ref 0.0–0.1)
Basophils Relative: 0 %
Eosinophils Absolute: 0.1 10*3/uL (ref 0.0–0.5)
Eosinophils Relative: 0 %
HCT: 35.1 % — ABNORMAL LOW (ref 39.0–52.0)
Hemoglobin: 12.2 g/dL — ABNORMAL LOW (ref 13.0–17.0)
Immature Granulocytes: 18 %
Lymphocytes Relative: 2 %
Lymphs Abs: 1.2 10*3/uL (ref 0.7–4.0)
MCH: 30.9 pg (ref 26.0–34.0)
MCHC: 34.8 g/dL (ref 30.0–36.0)
MCV: 88.9 fL (ref 80.0–100.0)
Monocytes Absolute: 1.9 10*3/uL — ABNORMAL HIGH (ref 0.1–1.0)
Monocytes Relative: 4 %
Neutro Abs: 39.9 10*3/uL — ABNORMAL HIGH (ref 1.7–7.7)
Neutrophils Relative %: 76 %
Platelet Count: 34 10*3/uL — ABNORMAL LOW (ref 150–400)
RBC: 3.95 MIL/uL — ABNORMAL LOW (ref 4.22–5.81)
RDW: 14.5 % (ref 11.5–15.5)
Smear Review: NORMAL
WBC Count: 52.5 10*3/uL (ref 4.0–10.5)
nRBC: 0.2 % (ref 0.0–0.2)

## 2021-11-24 MED ORDER — DEXAMETHASONE 1 MG PO TABS: 1.0000 mg | ORAL_TABLET | Freq: Every day | ORAL | 1 refills | Status: DC

## 2021-11-24 NOTE — Progress Notes (Signed)
[facility] [place], [place] [address] [phone]   New Patient Evaluation  Date of Service: 11/24/21 Patient Name: Christopher Randall Patient MRN: [id number] Patient DOB: 06-Nov-1961 Provider: Ventura Sellers, MD  Identifying Statement:  Christopher Randall is a 60 y.o. male with Brain metastasis [facility]) who presents for initial consultation and evaluation regarding cancer associated neurologic deficits.    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  Christopher Randall presents for follow up with neurologic complaints.  He and his wife describe issues with attention, memory, language expression over past 6 weeks since brain metastases were first diagnosed.  He has been on fluctuating dose of dexamethasone, but has poorly tolerated the treatment, with excessive leg swelling, weight gain, skin breakdown with weeping.  Recently dosed first cycle of chemo with Dr. Irene Limbo on 11/20/21.  He has been 'very tired, fatigued' since chemo was administered.  Medications: Current Outpatient Medications on File Prior to Visit  Medication Sig Dispense Refill   dexamethasone (DECADRON) 4 MG tablet Take 1 tablet (4 mg total) by mouth 2 (two) times daily with a meal. TAPER PLAN: Take 1 tablet TID through Nov 11. Then take 1 tablet BID  through Nov 25. Then take 1 tablet daily through Dec 9. Then take 1/2 tablet daily through Dec 16. Then take 1/2 every other day through Dec 23. 48 tablet 0   dexamethasone (DECADRON) 4 MG tablet Take 1 tab every 12 hours the day before pemetrexed chemo, then take 2 tabs once a day for 3 days starting the day after cisplatin. 30 tablet 1   Diclofenac Sodium 1.5 % SOLN Place one application on to the skin 2x daily. (Patient taking differently: Place one application on to the skin twice a day as needed for muscle cramps or aches) 1.5 Bottle 1   fluconazole (DIFLUCAN) 100 MG tablet Take 2 tablets today, then 1 tablet daily x 20 more days. 35 tablet 0   folic acid (FOLVITE) 1 MG tablet Take 1 tablet (1 mg total) by mouth daily. Start 7 days before pemetrexed chemotherapy. Continue until 21 days after pemetrexed completed. 100 tablet 3   furosemide (LASIX) 20 MG tablet Take 1 tablet (20 mg total) by mouth daily. 30 tablet 0   lidocaine-prilocaine (EMLA) cream Apply to affected area once 30 g 3   magic mouthwash (lidocaine, diphenhydrAMINE, alum & mag hydroxide) suspension Swish and spit 5 mls by mouth every 4-6 hours as needed 240 mL 2   Multiple Vitamins-Minerals (ICAPS AREDS 2 PO) Take 1 capsule by mouth daily.     nicotine polacrilex (COMMIT) 4 MG lozenge Take 4 mg by mouth 2 (two) times daily as needed for smoking cessation.     omeprazole (PRILOSEC) 20 MG capsule Take daily while on steroids. 60 capsule 0   ondansetron (ZOFRAN) 8 MG tablet  Take 1 tablet (8 mg total) by mouth 2 (two) times daily as needed (Nausea or vomiting). Start if needed on the third day after cisplatin. 30 tablet 1   oseltamivir (TAMIFLU) 75 MG capsule Take 1 capsule (75 mg total) by mouth 2 (two) times daily. 10 capsule 0   OVER THE COUNTER MEDICATION Natural Calm Magnesium Powder: Dissolve 1 teaspoon into 8 oz beverage and drink daily     prochlorperazine (COMPAZINE) 10 MG tablet Take 1 tablet (10 mg total) by mouth every 6  (six) hours as needed (Nausea or vomiting). 30 tablet 1   No current facility-administered medications on file prior to visit.    Allergies:  Allergies  Allergen Reactions   Iopamidol Hives and Itching    Isovue break through with prep, Hives and Itching  Patient had PET?CT scan with 125 mls of Isovue-300. Noted to have two raised itchy hives which self-resolved. Patient had PET/CT scan with contrast on 05/27/2014 while on prednisone prep (3 doses of 50 mg-13 hour prep) and noted to have two hives which self -resolved without treatment   Iodinated Diagnostic Agents Rash   Iodine-131 Rash   Past Medical History:  Past Medical History:  Diagnosis Date   Abnormal TSH 09/28/2017   Anxiety 07/03/2012   Depression    Difficulty sleeping 09/28/2017   Examination of participant in clinical trial 09/11/2012   History of immunotherapy 02/23/2016   Melanoma of skin 10/26/2011   Nicotine dependence 07/17/2015   Smoker    Past Surgical History:  Past Surgical History:  Procedure Laterality Date   BRONCHIAL BIOPSY  10/09/2021   Procedure: BRONCHIAL BIOPSIES;  Surgeon: [name], MD;  Location: [facility];  Service: Endoscopy;;   BRONCHIAL BRUSHINGS  10/09/2021   Procedure: BRONCHIAL BRUSHINGS;  Surgeon: [name], MD;  Location: [facility];  Service: Endoscopy;;   ENDOBRONCHIAL ULTRASOUND N/A 10/09/2021   Procedure: ENDOBRONCHIAL ULTRASOUND;  Surgeon: [name], MD;  Location: [facility];  Service: Endoscopy;  Laterality: N/A;   FINE NEEDLE ASPIRATION  10/09/2021   Procedure: FINE NEEDLE ASPIRATION (FNA) LINEAR;  Surgeon: [name], MD;  Location: [facility];  Service: Endoscopy;;   LEFT POPLITEAL MASS RESECTION  08/27/2011   pigmented villonodular synovitis, negative for malignancy   LYMPH GLAND EXCISION  07/12/2011   right axilla, consistent with melanoma   LYMPH NODE DISSECTION  08/27/2011   right, 0/17 nodes positive   VIDEO BRONCHOSCOPY N/A  10/09/2021   Procedure: VIDEO BRONCHOSCOPY WITHOUT FLUORO;  Surgeon: [name], MD;  Location: [facility];  Service: Endoscopy;  Laterality: N/A;   Social History:  Social History   Socioeconomic History   Marital status: Married    Spouse name: Not on file   Number of children: 3   Years of education: Not on file   Highest education level: Not on file  Occupational History    Employer: [employer]  Tobacco Use   Smoking status: Former    Packs/day: 0.25    Types: Cigarettes   Smokeless tobacco: Never  Vaping Use   Vaping Use: Never used  Substance and Sexual Activity   Alcohol use: Yes    Alcohol/week: 6.0 standard drinks    Types: 6 Shots of liquor per week   Drug use: No   Sexual activity: Not on file  Other Topics Concern   Not on file  Social History Narrative   Not on file   Social Determinants of Health   Financial Resource  Strain: Not on file  Food Insecurity: No Food Insecurity   Worried About Charity fundraiser in the Last Year: Never true   Ran Out of Food in the Last Year: Never true  Transportation Needs: No Transportation Needs   Lack of Transportation (Medical): No   Lack of Transportation (Non-Medical): No  Physical Activity: Not on file  Stress: Not on file  Social Connections: Not on file  Intimate Partner Violence: Not on file   Family History:  Family History  Problem Relation Age of Onset   Hypertension Mother    CAD Mother    Lung cancer Father     Review of Systems: Constitutional: Doesn't report fevers, chills or abnormal weight loss Eyes: Doesn't report blurriness of vision Ears, nose, mouth, throat, and face: Doesn't report sore throat Respiratory: Doesn't report cough, dyspnea or wheezes Cardiovascular: Doesn't report palpitation, chest discomfort  Gastrointestinal:  Doesn't report nausea, constipation, diarrhea GU: Doesn't report incontinence Skin: Doesn't report skin rashes Neurological: Per  HPI Musculoskeletal: Doesn't report joint pain Behavioral/Psych: Doesn't report anxiety  Physical Exam: Vitals:   11/24/21 1446  BP: (!) 151/93  Pulse: (!) 115  Resp: 18  Temp: (!) 97.5 F (36.4 C)  SpO2: 94%   KPS: 70. General: Alert, cooperative, pleasant, in no acute distress Head: Normal EENT: No conjunctival injection or scleral icterus.  Lungs: Resp effort normal Cardiac: Regular rate Abdomen: Non-distended abdomen Skin: No rashes cyanosis or petechiae. Extremities: No clubbing or edema  Neurologic Exam: Mental Status: Awake, alert, attentive to examiner. Oriented to self and environment. Language is fluent with intact comprehension.  Age advanced psychomotor slowing, features of agnosia noted.  Poor insight into clinical limitations. Cranial Nerves: Visual acuity is grossly normal. Visual fields are full. Extra-ocular movements intact. No ptosis. Face is symmetric Motor: Tone and bulk are normal. Power is full in both arms and legs. Reflexes are symmetric, no pathologic reflexes present.  Sensory: Intact to light touch Gait: Normal.   Labs: I have reviewed the data as listed    Component Value Date/Time   NA 131 (L) 11/20/2021 0946   K 4.0 11/20/2021 0946   CL 95 (L) 11/20/2021 0946   CO2 23 11/20/2021 0946   GLUCOSE 120 (H) 11/20/2021 0946   BUN 15 11/20/2021 0946   CREATININE 0.72 11/20/2021 0946   CALCIUM 8.3 (L) 11/20/2021 0946   PROT 6.1 (L) 11/20/2021 0946   ALBUMIN 2.8 (L) 11/20/2021 0946   AST 28 11/20/2021 0946   ALT 53 (H) 11/20/2021 0946   ALKPHOS 74 11/20/2021 0946   BILITOT 0.5 11/20/2021 0946   GFRNONAA >60 11/20/2021 0946   Lab Results  Component Value Date   WBC 22.4 (H) 11/20/2021   NEUTROABS 18.7 (H) 11/20/2021   HGB 12.4 (L) 11/20/2021   HCT 35.8 (L) 11/20/2021   MCV 89.1 11/20/2021   PLT 62 (L) 11/20/2021     Assessment/Plan Brain metastasis (HCC)  Christopher Randall presents with 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  He has exhibited poor tolerance of corticosteroids.  We recommended obtaining an MRI brain ASAP given ongoing deficits, possible need for second line therapy with avastin.  Avastin could act as steroid sparing agent and as strong anti-inflammatory agent within CNS.  For now, he can reduce decadron to 1mg  daily.  He and his wife understand cognitive issues will likely persist in the short term, given under-treatment of CNS edema.   Once scan  is obtained, we will follow up with treatment plan recommendations, ideally in time for next scheduled infusion on 12/10/21.  We spent twenty additional minutes teaching regarding the natural history, biology, and historical experience in the treatment of neurologic complications of cancer.   We appreciate the opportunity to participate in the care of DERRIN CURREY.   All questions were answered. The patient knows to call the clinic with any problems, questions or concerns. No barriers to learning were detected.  The total time spent in the encounter was 40 minutes and more than 50% was on counseling and review of test results   Ventura Sellers, MD Medical Director of Neuro-Oncology [facility] 11/24/21 2:57 PM

## 2021-11-26 ENCOUNTER — Other Ambulatory Visit: Payer: Self-pay | Admitting: Radiation Therapy

## 2021-11-26 ENCOUNTER — Encounter: Payer: Self-pay | Admitting: Hematology

## 2021-11-26 ENCOUNTER — Telehealth: Payer: Self-pay

## 2021-11-26 NOTE — Progress Notes (Addendum)
[facility]  [phone] [phone]    Oncology Clinic Visit Note  Encounter date.11/20/2021    Referral MD: Dr. 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 He has completed his SRS treatment for his brain metastases and notes no new neurological symptoms. He has developed significant bilateral lower extremity swelling with weeping skin lesions on account of his high-dose steroids being managed by radiation oncology. We discussed and he has been started on low-dose Lasix.  He also has been recovering from recent COVID-19 and influenza about 3 to 4 weeks ago.  He notes no residual respiratory symptoms or fever extremities.  Labs done today were reviewed with him in details. His CBC showed hemoglobin of 12.4, WBC count of 22.4 likely from steroids and new thrombocytopenia of 62,000 in the context of 2 viral infections COVID-19 and influenza as well as antivirals for each of these conditions. We discussed that he should not be able to proceed with his full chemotherapy due to his thrombocytopenia and we decided to proceed with pembrolizumab and significantly dose reduce carboplatin at 2 AUC with the plan to follow-up his platelets weekly. He is agreeable with this plan. No other acute new focal symptoms at this time. No fevers no chills no night sweats. No new headaches.  No seizure activity.      Past Medical History:  Diagnosis Date   Abnormal TSH 09/28/2017   Anxiety 07/03/2012   Depression     Difficulty sleeping 09/28/2017   Examination  of participant in clinical trial 09/11/2012   History of immunotherapy 02/23/2016   Melanoma of skin 10/26/2011   Nicotine dependence 07/17/2015   Smoker    :         Past Surgical History:  Procedure Laterality Date   LEFT POPLITEAL MASS RESECTION   08/27/2011    pigmented villonodular synovitis, negative for malignancy   LYMPH GLAND EXCISION   07/12/2011    right axilla, consistent with melanoma   LYMPH NODE DISSECTION   08/27/2011    right, 0/17 nodes positive  :             Current Facility-Administered Medications  Medication Dose Route Frequency Provider Last Rate Last Admin   acetaminophen (TYLENOL) tablet 650 mg  650 mg Oral Q6H PRN Olalere, Adewale A, MD        Or   acetaminophen (TYLENOL) suppository 650 mg  650 mg Rectal Q6H PRN Olalere, Adewale A, MD       dexamethasone (DECADRON) injection 6 mg  6 mg Intravenous Q6H Olalere, Adewale A, MD   6 mg at 10/09/21 0450   insulin aspart (novoLOG) injection 0-9 Units  0-9 Units Subcutaneous TID WC Olalere, Adewale A, MD   1 Units at 10/08/21 0740   lactated ringers infusion   Intravenous Continuous Olalere, Adewale A, MD   New Bag at 10/09/21 1130   nicotine (NICODERM CQ - dosed in mg/24 hours) patch 21 mg  21 mg Transdermal Once Olalere, Adewale A, MD       pantoprazole (PROTONIX) EC tablet 40 mg  40 mg Oral Daily Olalere, Adewale A, MD  40 mg at 10/08/21 1856             Allergies  Allergen Reactions   Iopamidol Hives and Itching      Isovue break through with prep, Hives and Itching   Patient had PET?CT scan with 125 mls of Isovue-300. Noted to have two raised itchy hives which self-resolved. Patient had PET/CT scan with contrast on 05/27/2014 while on prednisone prep (3 doses of 50 mg-13 hour prep) and noted to have two hives which self -resolved without treatment   Iodinated Diagnostic Agents Rash   Iodine-131 Rash  :         Family History  Problem Relation Age of Onset   Hypertension Mother     CAD Mother      Lung cancer Father    :    Social History         Socioeconomic History   Marital status: Divorced      Spouse name: Not on file   Number of children: 3   Years of education: Not on file   Highest education level: Not on file  Occupational History      Employer: [employer]  Tobacco Use   Smoking status: Every Day      Packs/day: 0.25      Types: Cigarettes   Smokeless tobacco: Never  Vaping Use   Vaping Use: Never used  Substance and Sexual Activity   Alcohol use: Yes      Alcohol/week: 6.0 standard drinks      Types: 6 Shots of liquor per week   Drug use: No   Sexual activity: Not on file  Other Topics Concern   Not on file  Social History Narrative   Not on file    Social Determinants of Health    Financial Resource Strain: Not on file  Food Insecurity: Not on file  Transportation Needs: Not on file  Physical Activity: Not on file  Stress: Not on file  Social Connections: Not on file  Intimate Partner Violence: Not on file  :   Review of Systems:. .10 Point review of Systems was done is negative except as noted above.  Exam:. Vital signs reviewed . GENERAL:alert, in no acute distress and comfortable SKIN: no acute rashes, no significant lesions EYES: conjunctiva are pink and non-injected, sclera anicteric OROPHARYNX: MMM, no exudates, no oropharyngeal erythema or ulceration NECK: supple, no JVD LYMPH:  no palpable lymphadenopathy in the cervical, axillary or inguinal regions LUNGS: clear to auscultation b/l with normal respiratory effort HEART: regular rate & rhythm ABDOMEN:  normoactive bowel sounds , non tender, not distended. Extremity: Bilateral 2-3+ pitting pedal edema with weeping wounds PSYCH: alert & oriented x 3 with fluent speech NEURO: no focal motor/sensory deficits   LABS Component     Latest Ref Rng & Units 11/20/2021  WBC     4.0 - 10.5 K/uL 22.4 (H)  RBC     4.22 - 5.81 MIL/uL 4.02 (L)  Hemoglobin     13.0 - 17.0 g/dL 12.4  (L)  HCT     39.0 - 52.0 % 35.8 (L)  MCV     80.0 - 100.0 fL 89.1  MCH     26.0 - 34.0 pg 30.8  MCHC     30.0 - 36.0 g/dL 34.6  RDW     11.5 - 15.5 % 14.6  Platelets     150 - 400 K/uL 62 (L)  nRBC     0.0 -  0.2 % 0.0  Neutrophils     % 83  NEUT#     1.7 - 7.7 K/uL 18.7 (H)  Lymphocytes     % 6  Lymphocyte #     0.7 - 4.0 K/uL 1.3  Monocytes Relative     % 5  Monocyte #     0.1 - 1.0 K/uL 1.1 (H)  Eosinophil     % 1  Eosinophils Absolute     0.0 - 0.5 K/uL 0.2  Basophil     % 0  Basophils Absolute     0.0 - 0.1 K/uL 0.1  Immature Granulocytes     % 5  Abs Immature Granulocytes     0.00 - 0.07 K/uL 1.10 (H)  Sodium     135 - 145 mmol/L 131 (L)  Potassium     3.5 - 5.1 mmol/L 4.0  Chloride     98 - 111 mmol/L 95 (L)  CO2     22 - 32 mmol/L 23  Glucose     70 - 99 mg/dL 120 (H)  BUN     6 - 20 mg/dL 15  Creatinine     0.61 - 1.24 mg/dL 0.72  Calcium     8.9 - 10.3 mg/dL 8.3 (L)  Total Protein     6.5 - 8.1 g/dL 6.1 (L)  Albumin     3.5 - 5.0 g/dL 2.8 (L)  AST     15 - 41 U/L 28  ALT     0 - 44 U/L 53 (H)  Alkaline Phosphatase     38 - 126 U/L 74  Total Bilirubin     0.3 - 1.2 mg/dL 0.5  GFR, Est Non African American     >60 mL/min >60  Anion gap     5 - 15 13    SURGICAL PATHOLOGY   THIS IS AN ADDENDUM REPORT   CASE: [id number]  PATIENT: [place]  Surgical Pathology Report  Addendum    Reason for Addendum #1:  Immunohistochemistry results   Clinical History: RML mass, (adc)      FINAL MICROSCOPIC DIAGNOSIS:   A. LUNG, RML MASS, BIOPSY:  - Poorly differentiated non-small cell carcinoma.  - See comment.   COMMENT:  Immunohistochemistry will be performed and reported as an addendum.   Dr. Vic Ripper agrees    GROSS DESCRIPTION:   Specimen is received in formalin and consists of multiple pieces of  tan-red soft tissue, ranging from less than 0.1 to 0.2 cm in greatest  dimension.  The specimen is entirely submitted  in 1 cassette.  Craig Staggers  10/09/2021)   Final Diagnosis performed by Claudette Laws, MD.   Electronically signed  10/12/2021  Technical and / or Professional components performed at [facility], [place] [address] [place], [place] [address].   Immunohistochemistry Technical component (if applicable) was performed  at [facility]. [address], [place],  [place],  [address].   IMMUNOHISTOCHEMISTRY DISCLAIMER (if applicable):  Some of these immunohistochemical stains may have been developed and the  performance characteristics determine by [facility]. Some  may not have been cleared or approved by the U.S. Food and Drug  Administration. The FDA has determined that such clearance or approval  is not necessary. This test is used for clinical purposes. It should not  be regarded as investigational or for research. This laboratory is  certified under the [employer]  (CLIA-[id number])  as qualified to perform high complexity clinical laboratory  testing.  The controls stained appropriately.   ADDENDUM: The carcinoma is positive with TTF-1 and negative with  cytokeratin 5/6 and p63 consistent with poorly differentiated  adenocarcinoma.  There is likely sufficient tissue for limited molecular  testing.   Imaging Results  CT Head Wo Contrast   Result Date: 10/06/2021 CLINICAL DATA:  Neuro deficit, acute, stroke suspected. Left sided weakness. History of melanoma. EXAM: CT HEAD WITHOUT CONTRAST TECHNIQUE: Contiguous axial images were obtained from the base of the skull through the vertex without intravenous contrast. COMPARISON:  07/28/2011 head MRI report FINDINGS: Brain: There is extensive vasogenic edema in the right frontal lobe with milder edema anteriorly in the left frontal lobe. There is a suspected 2 cm mass in the parasagittal right frontal lobe. Edema results in mild mass effect on the frontal horn of the  right lateral ventricle. No intracranial hemorrhage, definite acute cortically based infarct, significant midline shift, or extra-axial fluid collection is identified. Vascular: Calcified atherosclerosis at the skull base. No hyperdense vessel. Skull: No fracture or suspicious osseous lesion. Sinuses/Orbits: Mild mucosal thickening in the paranasal sinuses. Clear mastoid air cells. Unremarkable orbits. Other: None. IMPRESSION: Vasogenic edema in the frontal lobes, extensive on the right. This is most concerning for metastatic disease, and a brain MRI without and with contrast is recommended for further evaluation. Electronically Signed   By: Logan Bores M.D.   On: 10/06/2021 18:59    CT CHEST W CONTRAST   Result Date: 10/07/2021 CLINICAL DATA:  Metastatic disease evaluation. EXAM: CT CHEST, ABDOMEN, AND PELVIS WITH CONTRAST TECHNIQUE: Multidetector CT imaging of the chest, abdomen and pelvis was performed following the standard protocol during bolus administration of intravenous contrast. CONTRAST:  40m OMNIPAQUE IOHEXOL 350 MG/ML SOLN COMPARISON:  No relevant priors available at time dictation. FINDINGS: CT CHEST FINDINGS Cardiovascular: Aortic and branch vessel atherosclerosis without aneurysmal dilation. No central pulmonary embolus on this nondedicated study. Normal size heart. No significant pericardial effusion/thickening. Mediastinum/Nodes: Supraclavicular adenopathy for instance a 2.2 cm left supraclavicular lymph node on image 4/2. No discrete thyroid nodule. Mediastinal and right hilar adenopathy. For reference 2.2 cm a low right paratracheal lymph node on image 22/2 and a 1.8 cm right hilar lymph node on image 28/2. No pathologically enlarged axillary lymph nodes. Surgical clips in the right axilla. Lungs/Pleura: Right hilar mass which narrows the right middle lobe pulmonary artery and bronchus with obstruction of the lateral segment bronchus of the right middle lobe. The mass is difficult to  accurately measure given the associated postobstructive atelectasis, but measures approximately 5.6 x 4.0 cm on image 36/2. There are multiple ground-glass nodular opacities some of which demonstrate a central solid component for instance a 1.8 cm ground-glass opacity on image 53/4, a 1.1 cm nodular opacity on image 45/4 and a 0.9 cm nodular opacity image 48/4. No suspicious left-sided pulmonary nodules or masses. No pleural effusion. No pneumothorax. Apical predominant paraseptal emphysematous change. Musculoskeletal: No aggressive lytic or blastic lesion bone. Multilevel degenerative changes spine. CT ABDOMEN PELVIS FINDINGS Hepatobiliary: No suspicious hepatic lesion. Gallbladder is unremarkable. No biliary ductal dilation. Pancreas: No pancreatic ductal dilation or evidence of acute inflammation. Spleen: Within normal limits. Adrenals/Urinary Tract: Bilateral adrenal glands are unremarkable. No hydronephrosis. Left cortical renal scarring. No solid enhancing renal lesion. Mild wall thickening of an incompletely distended urinary bladder. Stomach/Bowel: Stomach is unremarkable for degree of distension. No pathologic dilation of small or large bowel. The appendix and terminal ileum  appear normal. Colon is predominately decompressed limiting evaluation. Vascular/Lymphatic: Aortic and branch vessel atherosclerosis without abdominal aortic aneurysm. No pathologically enlarged abdominal or pelvic lymph nodes. Reproductive: Dystrophic prostatic calcifications. Other: No significant abdominopelvic ascites. Musculoskeletal: Multilevel degenerative changes spine. 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 IMPRESSION: 1. Right hilar mass which narrows the right middle lobe pulmonary artery and bronchus with obstruction of the lateral segment bronchus of the right middle lobe. The mass is difficult to accurately measure given the associated postobstructive  atelectasis, but measures approximately 5.6 x 4.0 cm. 2. Supraclavicular, mediastinal and right hilar adenopathy. 3. Multiple ground-glass nodule in opacities some of which demonstrate a central solid component. Findings which are nonspecific and may be infectious and inflammatory versus reflecting metastatic disease involvement. Suggest attention on short-term interval follow-up study in 3 months. 4. No evidence of metastatic disease within the abdomen or pelvis. 5. Mild wall thickening of an incompletely distended urinary bladder. Correlate with urinalysis to exclude cystitis. 6. 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 7. Aortic Atherosclerosis (ICD10-I70.0). Electronically Signed   By: Dahlia Bailiff M.D.   On: 10/07/2021 19:07    MR BRAIN W WO CONTRAST   Result Date: 10/06/2021 CLINICAL DATA:  Brain mass or lesion EXAM: MRI HEAD WITHOUT AND WITH CONTRAST TECHNIQUE: Multiplanar, multiecho pulse sequences of the brain and surrounding structures were obtained without and with intravenous contrast. CONTRAST:  8.80m GADAVIST GADOBUTROL 1 MMOL/ML IV SOLN COMPARISON:  No prior MRI, correlation is made with same day CT brain FINDINGS: Brain: Multiple peripherally enhancing intracranial lesions, the largest of which is a 2.2 x 1.5 x 2.3 cm (AP x TR x CC) mass along the right frontal falx and is favored to extra-axial (series 16, image 138 and series 19, image 16). Significant surrounding T2 hyperintense signal, likely edema, which extends into the anterior and posterior right frontal lobe. Additional peripherally enhancing intraparenchymal lesions are also noted in the anterior left frontal lobe and posterior left parietal lobe. The left frontal lobe lesion measures 1.1 x 0.7 x 0.9 cm (series 16, image 121 and series 19, image 10). The left parietal lesion measures 0.8 x 0.7 x 0.6 cm (series 16, image 124 and series 19, image 26). There is significant  edema associated with the left frontal but not the left parietal lesion. No acute infarct, hemorrhage, hydrocephalus, extra-axial collection, or midline shift. Vascular: Normal flow voids. Skull and upper cervical spine: Intrinsically T1 hyperintense lesion in the clivus (series 16, image 31), indeterminate for enhancement. Otherwise normal marrow signal. Sinuses/Orbits: Mucosal thickening in the ethmoid air cells. Otherwise negative. Other: The mastoids are well aerated. IMPRESSION: Multiple peripherally enhancing intracranial lesions, the largest of which appears to be dural based, along the right aspect of the falx, with 2 additional peripherally enhancing lesions in the left frontal and parietal lobes. These are overall concerning for metastatic disease. Electronically Signed   By: AMerilyn BabaM.D.   On: 10/06/2021 23:07    CT ABDOMEN PELVIS W CONTRAST   Result Date: 10/07/2021 CLINICAL DATA:  Metastatic disease evaluation. EXAM: CT CHEST, ABDOMEN, AND PELVIS WITH CONTRAST TECHNIQUE: Multidetector CT imaging of the chest, abdomen and pelvis was performed following the standard protocol during bolus administration of intravenous contrast. CONTRAST:  869mOMNIPAQUE IOHEXOL 350 MG/ML SOLN COMPARISON:  No relevant priors available at time dictation. FINDINGS: CT CHEST FINDINGS Cardiovascular: Aortic and branch vessel atherosclerosis  without aneurysmal dilation. No central pulmonary embolus on this nondedicated study. Normal size heart. No significant pericardial effusion/thickening. Mediastinum/Nodes: Supraclavicular adenopathy for instance a 2.2 cm left supraclavicular lymph node on image 4/2. No discrete thyroid nodule. Mediastinal and right hilar adenopathy. For reference 2.2 cm a low right paratracheal lymph node on image 22/2 and a 1.8 cm right hilar lymph node on image 28/2. No pathologically enlarged axillary lymph nodes. Surgical clips in the right axilla. Lungs/Pleura: Right hilar mass which narrows  the right middle lobe pulmonary artery and bronchus with obstruction of the lateral segment bronchus of the right middle lobe. The mass is difficult to accurately measure given the associated postobstructive atelectasis, but measures approximately 5.6 x 4.0 cm on image 36/2. There are multiple ground-glass nodular opacities some of which demonstrate a central solid component for instance a 1.8 cm ground-glass opacity on image 53/4, a 1.1 cm nodular opacity on image 45/4 and a 0.9 cm nodular opacity image 48/4. No suspicious left-sided pulmonary nodules or masses. No pleural effusion. No pneumothorax. Apical predominant paraseptal emphysematous change. Musculoskeletal: No aggressive lytic or blastic lesion bone. Multilevel degenerative changes spine. CT ABDOMEN PELVIS FINDINGS Hepatobiliary: No suspicious hepatic lesion. Gallbladder is unremarkable. No biliary ductal dilation. Pancreas: No pancreatic ductal dilation or evidence of acute inflammation. Spleen: Within normal limits. Adrenals/Urinary Tract: Bilateral adrenal glands are unremarkable. No hydronephrosis. Left cortical renal scarring. No solid enhancing renal lesion. Mild wall thickening of an incompletely distended urinary bladder. Stomach/Bowel: Stomach is unremarkable for degree of distension. No pathologic dilation of small or large bowel. The appendix and terminal ileum appear normal. Colon is predominately decompressed limiting evaluation. Vascular/Lymphatic: Aortic and branch vessel atherosclerosis without abdominal aortic aneurysm. No pathologically enlarged abdominal or pelvic lymph nodes. Reproductive: Dystrophic prostatic calcifications. Other: No significant abdominopelvic ascites. Musculoskeletal: Multilevel degenerative changes spine. 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 IMPRESSION: 1. Right hilar mass which narrows the right middle lobe pulmonary artery and bronchus with  obstruction of the lateral segment bronchus of the right middle lobe. The mass is difficult to accurately measure given the associated postobstructive atelectasis, but measures approximately 5.6 x 4.0 cm. 2. Supraclavicular, mediastinal and right hilar adenopathy. 3. Multiple ground-glass nodule in opacities some of which demonstrate a central solid component. Findings which are nonspecific and may be infectious and inflammatory versus reflecting metastatic disease involvement. Suggest attention on short-term interval follow-up study in 3 months. 4. No evidence of metastatic disease within the abdomen or pelvis. 5. Mild wall thickening of an incompletely distended urinary bladder. Correlate with urinalysis to exclude cystitis. 6. 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 7. Aortic Atherosclerosis (ICD10-I70.0). Electronically Signed   By: Dahlia Bailiff M.D.   On: 10/07/2021 19:07    US Venous Img Lower Unilateral Left   Result Date: 10/06/2021 CLINICAL DATA:  Pain. EXAM: Left LOWER EXTREMITY VENOUS DOPPLER ULTRASOUND TECHNIQUE: Gray-scale sonography with compression, as well as color and duplex ultrasound, were performed to evaluate the deep venous system(s) from the level of the common femoral vein through the popliteal and proximal calf veins. COMPARISON:  None. FINDINGS: VENOUS Normal compressibility of the common femoral, superficial femoral, and popliteal veins, as well as the visualized calf veins. Visualized portions of profunda femoral vein and great saphenous vein unremarkable. No filling defects to suggest DVT on grayscale or color Doppler imaging. Doppler waveforms show normal direction of venous flow, normal  respiratory plasticity and response to augmentation. Limited views of the contralateral common femoral vein are unremarkable. OTHER Slow flow noted within the left popliteal vein of uncertain etiology. Limitations: none IMPRESSION: No  evidence for deep venous thrombosis in the left lower extremity. Electronically Signed   By: Ronney Asters M.D.   On: 10/06/2021 20:09    DG Chest Portable 1 View   Result Date: 10/06/2021 CLINICAL DATA:  Pain EXAM: PORTABLE CHEST 1 VIEW COMPARISON:  08/11/2021 FINDINGS: Persistent right middle lobe opacity no pleural effusion. Stable cardiomediastinal silhouette. IMPRESSION: Persistent right middle lobe opacity, given lack of interval clearing, recommend CT chest for further evaluation. Electronically Signed   By: Donavan Foil M.D.   On: 10/06/2021 19:08        Imaging Results  CT Head Wo Contrast   Result Date: 10/06/2021 CLINICAL DATA:  Neuro deficit, acute, stroke suspected. Left sided weakness. History of melanoma. EXAM: CT HEAD WITHOUT CONTRAST TECHNIQUE: Contiguous axial images were obtained from the base of the skull through the vertex without intravenous contrast. COMPARISON:  07/28/2011 head MRI report FINDINGS: Brain: There is extensive vasogenic edema in the right frontal lobe with milder edema anteriorly in the left frontal lobe. There is a suspected 2 cm mass in the parasagittal right frontal lobe. Edema results in mild mass effect on the frontal horn of the right lateral ventricle. No intracranial hemorrhage, definite acute cortically based infarct, significant midline shift, or extra-axial fluid collection is identified. Vascular: Calcified atherosclerosis at the skull base. No hyperdense vessel. Skull: No fracture or suspicious osseous lesion. Sinuses/Orbits: Mild mucosal thickening in the paranasal sinuses. Clear mastoid air cells. Unremarkable orbits. Other: None. IMPRESSION: Vasogenic edema in the frontal lobes, extensive on the right. This is most concerning for metastatic disease, and a brain MRI without and with contrast is recommended for further evaluation. Electronically Signed   By: Logan Bores M.D.   On: 10/06/2021 18:59    CT CHEST W CONTRAST   Result Date:  10/07/2021 CLINICAL DATA:  Metastatic disease evaluation. EXAM: CT CHEST, ABDOMEN, AND PELVIS WITH CONTRAST TECHNIQUE: Multidetector CT imaging of the chest, abdomen and pelvis was performed following the standard protocol during bolus administration of intravenous contrast. CONTRAST:  62m OMNIPAQUE IOHEXOL 350 MG/ML SOLN COMPARISON:  No relevant priors available at time dictation. FINDINGS: CT CHEST FINDINGS Cardiovascular: Aortic and branch vessel atherosclerosis without aneurysmal dilation. No central pulmonary embolus on this nondedicated study. Normal size heart. No significant pericardial effusion/thickening. Mediastinum/Nodes: Supraclavicular adenopathy for instance a 2.2 cm left supraclavicular lymph node on image 4/2. No discrete thyroid nodule. Mediastinal and right hilar adenopathy. For reference 2.2 cm a low right paratracheal lymph node on image 22/2 and a 1.8 cm right hilar lymph node on image 28/2. No pathologically enlarged axillary lymph nodes. Surgical clips in the right axilla. Lungs/Pleura: Right hilar mass which narrows the right middle lobe pulmonary artery and bronchus with obstruction of the lateral segment bronchus of the right middle lobe. The mass is difficult to accurately measure given the associated postobstructive atelectasis, but measures approximately 5.6 x 4.0 cm on image 36/2. There are multiple ground-glass nodular opacities some of which demonstrate a central solid component for instance a 1.8 cm ground-glass opacity on image 53/4, a 1.1 cm nodular opacity on image 45/4 and a 0.9 cm nodular opacity image 48/4. No suspicious left-sided pulmonary nodules or masses. No pleural effusion. No pneumothorax. Apical predominant paraseptal emphysematous change. Musculoskeletal: No aggressive lytic or blastic lesion bone. Multilevel degenerative  changes spine. CT ABDOMEN PELVIS FINDINGS Hepatobiliary: No suspicious hepatic lesion. Gallbladder is unremarkable. No biliary ductal dilation.  Pancreas: No pancreatic ductal dilation or evidence of acute inflammation. Spleen: Within normal limits. Adrenals/Urinary Tract: Bilateral adrenal glands are unremarkable. No hydronephrosis. Left cortical renal scarring. No solid enhancing renal lesion. Mild wall thickening of an incompletely distended urinary bladder. Stomach/Bowel: Stomach is unremarkable for degree of distension. No pathologic dilation of small or large bowel. The appendix and terminal ileum appear normal. Colon is predominately decompressed limiting evaluation. Vascular/Lymphatic: Aortic and branch vessel atherosclerosis without abdominal aortic aneurysm. No pathologically enlarged abdominal or pelvic lymph nodes. Reproductive: Dystrophic prostatic calcifications. Other: No significant abdominopelvic ascites. Musculoskeletal: Multilevel degenerative changes spine. 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 IMPRESSION: 1. Right hilar mass which narrows the right middle lobe pulmonary artery and bronchus with obstruction of the lateral segment bronchus of the right middle lobe. The mass is difficult to accurately measure given the associated postobstructive atelectasis, but measures approximately 5.6 x 4.0 cm. 2. Supraclavicular, mediastinal and right hilar adenopathy. 3. Multiple ground-glass nodule in opacities some of which demonstrate a central solid component. Findings which are nonspecific and may be infectious and inflammatory versus reflecting metastatic disease involvement. Suggest attention on short-term interval follow-up study in 3 months. 4. No evidence of metastatic disease within the abdomen or pelvis. 5. Mild wall thickening of an incompletely distended urinary bladder. Correlate with urinalysis to exclude cystitis. 6. 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 7. Aortic Atherosclerosis (ICD10-I70.0). Electronically  Signed   By: Dahlia Bailiff M.D.   On: 10/07/2021 19:07    MR BRAIN W WO CONTRAST   Result Date: 10/06/2021 CLINICAL DATA:  Brain mass or lesion EXAM: MRI HEAD WITHOUT AND WITH CONTRAST TECHNIQUE: Multiplanar, multiecho pulse sequences of the brain and surrounding structures were obtained without and with intravenous contrast. CONTRAST:  8.99m GADAVIST GADOBUTROL 1 MMOL/ML IV SOLN COMPARISON:  No prior MRI, correlation is made with same day CT brain FINDINGS: Brain: Multiple peripherally enhancing intracranial lesions, the largest of which is a 2.2 x 1.5 x 2.3 cm (AP x TR x CC) mass along the right frontal falx and is favored to extra-axial (series 16, image 138 and series 19, image 16). Significant surrounding T2 hyperintense signal, likely edema, which extends into the anterior and posterior right frontal lobe. Additional peripherally enhancing intraparenchymal lesions are also noted in the anterior left frontal lobe and posterior left parietal lobe. The left frontal lobe lesion measures 1.1 x 0.7 x 0.9 cm (series 16, image 121 and series 19, image 10). The left parietal lesion measures 0.8 x 0.7 x 0.6 cm (series 16, image 124 and series 19, image 26). There is significant edema associated with the left frontal but not the left parietal lesion. No acute infarct, hemorrhage, hydrocephalus, extra-axial collection, or midline shift. Vascular: Normal flow voids. Skull and upper cervical spine: Intrinsically T1 hyperintense lesion in the clivus (series 16, image 31), indeterminate for enhancement. Otherwise normal marrow signal. Sinuses/Orbits: Mucosal thickening in the ethmoid air cells. Otherwise negative. Other: The mastoids are well aerated. IMPRESSION: Multiple peripherally enhancing intracranial lesions, the largest of which appears to be dural based, along the right aspect of the falx, with 2 additional peripherally enhancing lesions in the left frontal and parietal lobes. These are overall concerning for  metastatic disease. Electronically Signed   By: AFrancetta FoundD.  On: 10/06/2021 23:07    CT ABDOMEN PELVIS W CONTRAST   Result Date: 10/07/2021 CLINICAL DATA:  Metastatic disease evaluation. EXAM: CT CHEST, ABDOMEN, AND PELVIS WITH CONTRAST TECHNIQUE: Multidetector CT imaging of the chest, abdomen and pelvis was performed following the standard protocol during bolus administration of intravenous contrast. CONTRAST:  33m OMNIPAQUE IOHEXOL 350 MG/ML SOLN COMPARISON:  No relevant priors available at time dictation. FINDINGS: CT CHEST FINDINGS Cardiovascular: Aortic and branch vessel atherosclerosis without aneurysmal dilation. No central pulmonary embolus on this nondedicated study. Normal size heart. No significant pericardial effusion/thickening. Mediastinum/Nodes: Supraclavicular adenopathy for instance a 2.2 cm left supraclavicular lymph node on image 4/2. No discrete thyroid nodule. Mediastinal and right hilar adenopathy. For reference 2.2 cm a low right paratracheal lymph node on image 22/2 and a 1.8 cm right hilar lymph node on image 28/2. No pathologically enlarged axillary lymph nodes. Surgical clips in the right axilla. Lungs/Pleura: Right hilar mass which narrows the right middle lobe pulmonary artery and bronchus with obstruction of the lateral segment bronchus of the right middle lobe. The mass is difficult to accurately measure given the associated postobstructive atelectasis, but measures approximately 5.6 x 4.0 cm on image 36/2. There are multiple ground-glass nodular opacities some of which demonstrate a central solid component for instance a 1.8 cm ground-glass opacity on image 53/4, a 1.1 cm nodular opacity on image 45/4 and a 0.9 cm nodular opacity image 48/4. No suspicious left-sided pulmonary nodules or masses. No pleural effusion. No pneumothorax. Apical predominant paraseptal emphysematous change. Musculoskeletal: No aggressive lytic or blastic lesion bone. Multilevel degenerative  changes spine. CT ABDOMEN PELVIS FINDINGS Hepatobiliary: No suspicious hepatic lesion. Gallbladder is unremarkable. No biliary ductal dilation. Pancreas: No pancreatic ductal dilation or evidence of acute inflammation. Spleen: Within normal limits. Adrenals/Urinary Tract: Bilateral adrenal glands are unremarkable. No hydronephrosis. Left cortical renal scarring. No solid enhancing renal lesion. Mild wall thickening of an incompletely distended urinary bladder. Stomach/Bowel: Stomach is unremarkable for degree of distension. No pathologic dilation of small or large bowel. The appendix and terminal ileum appear normal. Colon is predominately decompressed limiting evaluation. Vascular/Lymphatic: Aortic and branch vessel atherosclerosis without abdominal aortic aneurysm. No pathologically enlarged abdominal or pelvic lymph nodes. Reproductive: Dystrophic prostatic calcifications. Other: No significant abdominopelvic ascites. Musculoskeletal: Multilevel degenerative changes spine. 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 IMPRESSION: 1. Right hilar mass which narrows the right middle lobe pulmonary artery and bronchus with obstruction of the lateral segment bronchus of the right middle lobe. The mass is difficult to accurately measure given the associated postobstructive atelectasis, but measures approximately 5.6 x 4.0 cm. 2. Supraclavicular, mediastinal and right hilar adenopathy. 3. Multiple ground-glass nodule in opacities some of which demonstrate a central solid component. Findings which are nonspecific and may be infectious and inflammatory versus reflecting metastatic disease involvement. Suggest attention on short-term interval follow-up study in 3 months. 4. No evidence of metastatic disease within the abdomen or pelvis. 5. Mild wall thickening of an incompletely distended urinary bladder. Correlate with urinalysis to exclude cystitis. 6. Chronic L5  pars defects with grade 1 L5 on S1 anterolisthesis and prominent L5-S1 discogenic disease. 7. Aortic Atherosclerosis (ICD10-I70.0). Electronically Signed   By: JDahlia BailiffM.D.   On: 10/07/2021 19:07    [place]ous Img Lower Unilateral Left   Result Date: 10/06/2021 CLINICAL DATA:  Pain. EXAM: Left LOWER EXTREMITY VENOUS DOPPLER ULTRASOUND TECHNIQUE: Gray-scale sonography with compression, as well as color and duplex  ultrasound, were performed to evaluate the deep venous system(s) from the level of the common femoral vein through the popliteal and proximal calf veins. COMPARISON:  None. FINDINGS: VENOUS Normal compressibility of the common femoral, superficial femoral, and popliteal veins, as well as the visualized calf veins. Visualized portions of profunda femoral vein and great saphenous vein unremarkable. No filling defects to suggest DVT on grayscale or color Doppler imaging. Doppler waveforms show normal direction of venous flow, normal respiratory plasticity and response to augmentation. Limited views of the contralateral common femoral vein are unremarkable. OTHER Slow flow noted within the left popliteal vein of uncertain etiology. Limitations: none IMPRESSION: No evidence for deep venous thrombosis in the left lower extremity. Electronically Signed   By: Ronney Asters M.D.   On: 10/06/2021 20:09    DG Chest Portable 1 View   Result Date: 10/06/2021 CLINICAL DATA:  Pain EXAM: PORTABLE CHEST 1 VIEW COMPARISON:  08/11/2021 FINDINGS: Persistent right middle lobe opacity no pleural effusion. Stable cardiomediastinal silhouette. IMPRESSION: Persistent right middle lobe opacity, given lack of interval clearing, recommend CT chest for further evaluation. Electronically Signed   By: Donavan Foil M.D.   On: 10/06/2021 19:08       Assessment and Plan:   This is a 60 year old male with   1) stage IV poorly differentiated lung adenocarcinoma No targetable mutations on guardant 360 MSI high not  detected PIK3CA mutation might have clinical trials available elsewhere to consider later in the course of treatment. -10/06/2021 the MRI of the brain with and without contrast- "Multiple peripherally enhancing intracranial lesions, the largest of which appears to be dural based, along the right aspect of the falx, with 2 additional peripherally enhancing lesions in the left frontal and parietal lobes. These are overall concerning for metastatic disease." -10/07/2021 CT chest/abdomen/pelvis with contrast- "1. Right hilar mass which narrows the right middle lobe pulmonary artery and bronchus with obstruction of the lateral segment bronchus of the right middle lobe. The mass is difficult to accurately measure given the associated postobstructive atelectasis, but measures approximately 5.6 x 4.0 cm. 2. Supraclavicular, mediastinal and right hilar adenopathy. 3. Multiple ground-glass nodule in opacities some of which demonstrate a central solid component. Findings which are nonspecific and may be infectious and inflammatory versus reflecting metastatic disease involvement. Suggest attention on short-term interval follow-up study in 3 months. 4. No evidence of metastatic disease within the abdomen or pelvis. 5. Mild wall thickening of an incompletely distended urinary bladder. Correlate with urinalysis to exclude cystitis. 6. 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 7. Aortic Atherosclerosis (ICD10-I70.0)."   2) history of melanoma, stage III -Diagnosed June 2012 -Right axillary excisional biopsy in July 2012 consistent with melanoma -Status post right axillary lymph node dissection, 0/17 nodes negative for malignancy -Received high-dose interferon in the ECOG 1609 clinical trial on 10/18/2011 -No evidence of recurrent or metastatic disease on imaging per notes from [place] with last scan performed on 10/23/2018   3) COVID-19 and influenza A  infection diagnosed 10/27/2021.  4) bilateral lower extremities significant swelling likely due to fluid retention from dexamethasone prescribed for his brain metastases by radiation oncology.  PLAN: -His labs were discussed in details with him as noted above. His CBC showed hemoglobin of 12.4, WBC count of 22.4 likely from steroids and new thrombocytopenia of 62,000 in the context of 2 viral infections COVID-19 and influenza as well as antivirals for each of these conditions. We discussed that  he should not be able to proceed with his full chemotherapy due to his thrombocytopenia and we decided to proceed with pembrolizumab and significantly dose reduce carboplatin at 2 AUC with the plan to follow-up his platelets weekly.  Holding Alimta completely today He is agreeable with this plan. -Started on Lasix 20 mg p.o. daily for his lower extremity swelling and recommended bilateral lower extremity leg elevation. -Bilateral lower extremity ultrasound to rule out DVT though this seems less likely -Radiation oncology is aware of his leg swelling and is tapering off of steroids by 11/29/2021. -Chemotherapy orders were adjusted reviewed and signed.  Follow-up ultrasound bilateral lower extremity venous as soon as possible to rule out DVT Port flush and labs weekly x 3 MD visit in 7-10 days for toxicity check Please schedule next cycle of chemotherapy in 3 weeks.  . The total time spent in the appointment was 32 minutes and more than 50% was on counseling and direct patient cares, ordering and management of chemotherapy.

## 2021-11-26 NOTE — Telephone Encounter (Signed)
Contacted wife regarding mychart message. Discussed leg elevation and also discouraged use of massage and compression socks at this time. Wife is using nonadherent dressings on pt's weeping areas on calves. Pt's wife states there is no yellow drainage, no bleeding or foul odor . Wife verbalized understanding of importance of leg elevation and what s/s of infection to look for.

## 2021-11-27 ENCOUNTER — Inpatient Hospital Stay (HOSPITAL_BASED_OUTPATIENT_CLINIC_OR_DEPARTMENT_OTHER): Payer: BC Managed Care – PPO | Admitting: Physician Assistant

## 2021-11-27 ENCOUNTER — Ambulatory Visit: Payer: BC Managed Care – PPO | Admitting: Hematology

## 2021-11-27 ENCOUNTER — Inpatient Hospital Stay: Payer: BC Managed Care – PPO

## 2021-11-27 ENCOUNTER — Observation Stay
Admission: AD | Admit: 2021-11-27 | Payer: BC Managed Care – PPO | Source: Ambulatory Visit | Admitting: Internal Medicine

## 2021-11-27 ENCOUNTER — Other Ambulatory Visit: Payer: Self-pay | Admitting: Hematology

## 2021-11-27 ENCOUNTER — Encounter (HOSPITAL_COMMUNITY): Payer: Self-pay | Admitting: Emergency Medicine

## 2021-11-27 ENCOUNTER — Other Ambulatory Visit: Payer: Self-pay

## 2021-11-27 ENCOUNTER — Other Ambulatory Visit: Payer: BC Managed Care – PPO

## 2021-11-27 ENCOUNTER — Encounter: Payer: Self-pay | Admitting: Hematology

## 2021-11-27 ENCOUNTER — Ambulatory Visit (HOSPITAL_COMMUNITY)
Admission: RE | Admit: 2021-11-27 | Discharge: 2021-11-27 | Disposition: A | Discharge status: Home / Self Care | Payer: BC Managed Care – PPO | Source: Ambulatory Visit | Attending: Internal Medicine | Admitting: Internal Medicine

## 2021-11-27 ENCOUNTER — Encounter (HOSPITAL_COMMUNITY): Payer: BC Managed Care – PPO

## 2021-11-27 ENCOUNTER — Inpatient Hospital Stay (HOSPITAL_COMMUNITY)
Admission: EM | Admit: 2021-11-27 | Discharge: 2021-12-04 | DRG: 064 | Disposition: A | Discharge status: Rehabilitation Facility | Payer: BC Managed Care – PPO | Attending: Internal Medicine | Admitting: Internal Medicine

## 2021-11-27 ENCOUNTER — Emergency Department (HOSPITAL_COMMUNITY): Payer: BC Managed Care – PPO

## 2021-11-27 VITALS — BP 126/87 | HR 107 | Temp 98.4°F | Resp 17 | Ht 69.0 in | Wt 203.8 lb

## 2021-11-27 DIAGNOSIS — C3490 Malignant neoplasm of unspecified part of unspecified bronchus or lung: Secondary | ICD-10-CM

## 2021-11-27 DIAGNOSIS — M71122 Other infective bursitis, left elbow: Secondary | ICD-10-CM | POA: Diagnosis not present

## 2021-11-27 DIAGNOSIS — T451X5A Adverse effect of antineoplastic and immunosuppressive drugs, initial encounter: Secondary | ICD-10-CM | POA: Diagnosis present

## 2021-11-27 DIAGNOSIS — I213 ST elevation (STEMI) myocardial infarction of unspecified site: Secondary | ICD-10-CM

## 2021-11-27 DIAGNOSIS — I82463 Acute embolism and thrombosis of calf muscular vein, bilateral: Secondary | ICD-10-CM | POA: Diagnosis present

## 2021-11-27 DIAGNOSIS — I2119 ST elevation (STEMI) myocardial infarction involving other coronary artery of inferior wall: Secondary | ICD-10-CM | POA: Diagnosis present

## 2021-11-27 DIAGNOSIS — J9601 Acute respiratory failure with hypoxia: Secondary | ICD-10-CM | POA: Diagnosis present

## 2021-11-27 DIAGNOSIS — L089 Local infection of the skin and subcutaneous tissue, unspecified: Secondary | ICD-10-CM | POA: Diagnosis not present

## 2021-11-27 DIAGNOSIS — C342 Malignant neoplasm of middle lobe, bronchus or lung: Secondary | ICD-10-CM | POA: Diagnosis not present

## 2021-11-27 DIAGNOSIS — E785 Hyperlipidemia, unspecified: Secondary | ICD-10-CM | POA: Diagnosis present

## 2021-11-27 DIAGNOSIS — D6959 Other secondary thrombocytopenia: Secondary | ICD-10-CM | POA: Diagnosis present

## 2021-11-27 DIAGNOSIS — C7931 Secondary malignant neoplasm of brain: Secondary | ICD-10-CM | POA: Diagnosis present

## 2021-11-27 DIAGNOSIS — L0390 Cellulitis, unspecified: Secondary | ICD-10-CM | POA: Diagnosis present

## 2021-11-27 DIAGNOSIS — A419 Sepsis, unspecified organism: Secondary | ICD-10-CM | POA: Diagnosis present

## 2021-11-27 DIAGNOSIS — C3491 Malignant neoplasm of unspecified part of right bronchus or lung: Secondary | ICD-10-CM | POA: Diagnosis present

## 2021-11-27 DIAGNOSIS — E222 Syndrome of inappropriate secretion of antidiuretic hormone: Secondary | ICD-10-CM | POA: Diagnosis not present

## 2021-11-27 DIAGNOSIS — I6611 Occlusion and stenosis of right anterior cerebral artery: Secondary | ICD-10-CM | POA: Diagnosis present

## 2021-11-27 DIAGNOSIS — L03115 Cellulitis of right lower limb: Secondary | ICD-10-CM | POA: Diagnosis present

## 2021-11-27 DIAGNOSIS — L03119 Cellulitis of unspecified part of limb: Secondary | ICD-10-CM | POA: Diagnosis not present

## 2021-11-27 DIAGNOSIS — Z7901 Long term (current) use of anticoagulants: Secondary | ICD-10-CM | POA: Diagnosis not present

## 2021-11-27 DIAGNOSIS — R410 Disorientation, unspecified: Secondary | ICD-10-CM

## 2021-11-27 DIAGNOSIS — J188 Other pneumonia, unspecified organism: Secondary | ICD-10-CM | POA: Diagnosis present

## 2021-11-27 DIAGNOSIS — F32A Depression, unspecified: Secondary | ICD-10-CM | POA: Diagnosis present

## 2021-11-27 DIAGNOSIS — D6481 Anemia due to antineoplastic chemotherapy: Secondary | ICD-10-CM | POA: Diagnosis present

## 2021-11-27 DIAGNOSIS — Q2112 Patent foramen ovale: Secondary | ICD-10-CM | POA: Diagnosis not present

## 2021-11-27 DIAGNOSIS — I63133 Cerebral infarction due to embolism of bilateral carotid arteries: Secondary | ICD-10-CM | POA: Diagnosis present

## 2021-11-27 DIAGNOSIS — D696 Thrombocytopenia, unspecified: Secondary | ICD-10-CM | POA: Diagnosis present

## 2021-11-27 DIAGNOSIS — E871 Hypo-osmolality and hyponatremia: Secondary | ICD-10-CM | POA: Diagnosis present

## 2021-11-27 DIAGNOSIS — M7022 Olecranon bursitis, left elbow: Secondary | ICD-10-CM | POA: Diagnosis not present

## 2021-11-27 DIAGNOSIS — Z20822 Contact with and (suspected) exposure to covid-19: Secondary | ICD-10-CM | POA: Diagnosis present

## 2021-11-27 DIAGNOSIS — I639 Cerebral infarction, unspecified: Secondary | ICD-10-CM | POA: Diagnosis not present

## 2021-11-27 DIAGNOSIS — R29700 NIHSS score 0: Secondary | ICD-10-CM | POA: Diagnosis present

## 2021-11-27 DIAGNOSIS — B3781 Candidal esophagitis: Secondary | ICD-10-CM | POA: Diagnosis present

## 2021-11-27 DIAGNOSIS — F17200 Nicotine dependence, unspecified, uncomplicated: Secondary | ICD-10-CM | POA: Diagnosis present

## 2021-11-27 DIAGNOSIS — Z8673 Personal history of transient ischemic attack (TIA), and cerebral infarction without residual deficits: Secondary | ICD-10-CM | POA: Diagnosis not present

## 2021-11-27 DIAGNOSIS — Z8616 Personal history of COVID-19: Secondary | ICD-10-CM

## 2021-11-27 DIAGNOSIS — R531 Weakness: Secondary | ICD-10-CM

## 2021-11-27 DIAGNOSIS — C779 Secondary and unspecified malignant neoplasm of lymph node, unspecified: Secondary | ICD-10-CM | POA: Diagnosis present

## 2021-11-27 DIAGNOSIS — J9600 Acute respiratory failure, unspecified whether with hypoxia or hypercapnia: Secondary | ICD-10-CM | POA: Diagnosis present

## 2021-11-27 DIAGNOSIS — D649 Anemia, unspecified: Secondary | ICD-10-CM

## 2021-11-27 DIAGNOSIS — D72823 Leukemoid reaction: Secondary | ICD-10-CM | POA: Diagnosis present

## 2021-11-27 DIAGNOSIS — J189 Pneumonia, unspecified organism: Secondary | ICD-10-CM | POA: Diagnosis present

## 2021-11-27 DIAGNOSIS — D72829 Elevated white blood cell count, unspecified: Secondary | ICD-10-CM | POA: Diagnosis not present

## 2021-11-27 DIAGNOSIS — R609 Edema, unspecified: Secondary | ICD-10-CM | POA: Diagnosis not present

## 2021-11-27 DIAGNOSIS — D6859 Other primary thrombophilia: Secondary | ICD-10-CM | POA: Diagnosis not present

## 2021-11-27 DIAGNOSIS — L03114 Cellulitis of left upper limb: Secondary | ICD-10-CM | POA: Diagnosis present

## 2021-11-27 DIAGNOSIS — J9811 Atelectasis: Secondary | ICD-10-CM | POA: Diagnosis not present

## 2021-11-27 DIAGNOSIS — M009 Pyogenic arthritis, unspecified: Secondary | ICD-10-CM | POA: Diagnosis present

## 2021-11-27 DIAGNOSIS — L03116 Cellulitis of left lower limb: Secondary | ICD-10-CM | POA: Diagnosis present

## 2021-11-27 DIAGNOSIS — Z87891 Personal history of nicotine dependence: Secondary | ICD-10-CM | POA: Diagnosis not present

## 2021-11-27 DIAGNOSIS — E860 Dehydration: Secondary | ICD-10-CM | POA: Diagnosis present

## 2021-11-27 DIAGNOSIS — Z79899 Other long term (current) drug therapy: Secondary | ICD-10-CM | POA: Diagnosis not present

## 2021-11-27 DIAGNOSIS — G479 Sleep disorder, unspecified: Secondary | ICD-10-CM | POA: Diagnosis present

## 2021-11-27 DIAGNOSIS — R9431 Abnormal electrocardiogram [ECG] [EKG]: Secondary | ICD-10-CM | POA: Diagnosis not present

## 2021-11-27 DIAGNOSIS — Z888 Allergy status to other drugs, medicaments and biological substances status: Secondary | ICD-10-CM

## 2021-11-27 DIAGNOSIS — I6389 Other cerebral infarction: Secondary | ICD-10-CM | POA: Diagnosis not present

## 2021-11-27 DIAGNOSIS — G459 Transient cerebral ischemic attack, unspecified: Secondary | ICD-10-CM

## 2021-11-27 DIAGNOSIS — Z801 Family history of malignant neoplasm of trachea, bronchus and lung: Secondary | ICD-10-CM

## 2021-11-27 DIAGNOSIS — M3211 Endocarditis in systemic lupus erythematosus: Secondary | ICD-10-CM | POA: Diagnosis present

## 2021-11-27 DIAGNOSIS — I6340 Cerebral infarction due to embolism of unspecified cerebral artery: Secondary | ICD-10-CM

## 2021-11-27 DIAGNOSIS — D6869 Other thrombophilia: Secondary | ICD-10-CM | POA: Diagnosis present

## 2021-11-27 DIAGNOSIS — R069 Unspecified abnormalities of breathing: Secondary | ICD-10-CM

## 2021-11-27 DIAGNOSIS — I824Y3 Acute embolism and thrombosis of unspecified deep veins of proximal lower extremity, bilateral: Secondary | ICD-10-CM | POA: Diagnosis present

## 2021-11-27 DIAGNOSIS — Z7902 Long term (current) use of antithrombotics/antiplatelets: Secondary | ICD-10-CM | POA: Diagnosis not present

## 2021-11-27 DIAGNOSIS — I743 Embolism and thrombosis of arteries of the lower extremities: Secondary | ICD-10-CM | POA: Diagnosis not present

## 2021-11-27 DIAGNOSIS — Z7189 Other specified counseling: Secondary | ICD-10-CM | POA: Diagnosis not present

## 2021-11-27 DIAGNOSIS — M7989 Other specified soft tissue disorders: Secondary | ICD-10-CM | POA: Diagnosis not present

## 2021-11-27 DIAGNOSIS — E876 Hypokalemia: Secondary | ICD-10-CM | POA: Diagnosis not present

## 2021-11-27 DIAGNOSIS — R569 Unspecified convulsions: Secondary | ICD-10-CM | POA: Diagnosis not present

## 2021-11-27 DIAGNOSIS — F419 Anxiety disorder, unspecified: Secondary | ICD-10-CM | POA: Diagnosis present

## 2021-11-27 DIAGNOSIS — R5381 Other malaise: Secondary | ICD-10-CM | POA: Diagnosis not present

## 2021-11-27 DIAGNOSIS — Z85820 Personal history of malignant melanoma of skin: Secondary | ICD-10-CM

## 2021-11-27 DIAGNOSIS — I252 Old myocardial infarction: Secondary | ICD-10-CM

## 2021-11-27 DIAGNOSIS — I509 Heart failure, unspecified: Secondary | ICD-10-CM

## 2021-11-27 DIAGNOSIS — I219 Acute myocardial infarction, unspecified: Secondary | ICD-10-CM

## 2021-11-27 DIAGNOSIS — E43 Unspecified severe protein-calorie malnutrition: Secondary | ICD-10-CM | POA: Diagnosis not present

## 2021-11-27 DIAGNOSIS — Z8249 Family history of ischemic heart disease and other diseases of the circulatory system: Secondary | ICD-10-CM

## 2021-11-27 DIAGNOSIS — R040 Epistaxis: Secondary | ICD-10-CM | POA: Diagnosis present

## 2021-11-27 DIAGNOSIS — I82493 Acute embolism and thrombosis of other specified deep vein of lower extremity, bilateral: Secondary | ICD-10-CM | POA: Diagnosis not present

## 2021-11-27 DIAGNOSIS — F329 Major depressive disorder, single episode, unspecified: Secondary | ICD-10-CM | POA: Diagnosis not present

## 2021-11-27 DIAGNOSIS — Z91041 Radiographic dye allergy status: Secondary | ICD-10-CM

## 2021-11-27 HISTORY — DX: Anemia, unspecified: D64.9

## 2021-11-27 HISTORY — DX: ST elevation (STEMI) myocardial infarction of unspecified site: I21.3

## 2021-11-27 LAB — CBC WITH DIFFERENTIAL (CANCER CENTER ONLY)
Abs Immature Granulocytes: 6.77 10*3/uL — ABNORMAL HIGH (ref 0.00–0.07)
Basophils Absolute: 0 10*3/uL (ref 0.0–0.1)
Basophils Relative: 0 %
Eosinophils Absolute: 0.1 10*3/uL (ref 0.0–0.5)
Eosinophils Relative: 0 %
HCT: 34 % — ABNORMAL LOW (ref 39.0–52.0)
Hemoglobin: 11.9 g/dL — ABNORMAL LOW (ref 13.0–17.0)
Immature Granulocytes: 10 %
Lymphocytes Relative: 3 %
Lymphs Abs: 2.1 10*3/uL (ref 0.7–4.0)
MCH: 31 pg (ref 26.0–34.0)
MCHC: 35 g/dL (ref 30.0–36.0)
MCV: 88.5 fL (ref 80.0–100.0)
Monocytes Absolute: 4.7 10*3/uL — ABNORMAL HIGH (ref 0.1–1.0)
Monocytes Relative: 7 %
Neutro Abs: 53.2 10*3/uL — ABNORMAL HIGH (ref 1.7–7.7)
Neutrophils Relative %: 80 %
Platelet Count: 29 10*3/uL — ABNORMAL LOW (ref 150–400)
RBC: 3.84 MIL/uL — ABNORMAL LOW (ref 4.22–5.81)
RDW: 14.7 % (ref 11.5–15.5)
WBC Count: 67 10*3/uL (ref 4.0–10.5)
nRBC: 0.2 % (ref 0.0–0.2)

## 2021-11-27 LAB — COMPREHENSIVE METABOLIC PANEL
ALT: 33 U/L (ref 0–44)
AST: 42 U/L — ABNORMAL HIGH (ref 15–41)
Albumin: 2.6 g/dL — ABNORMAL LOW (ref 3.5–5.0)
Alkaline Phosphatase: 146 U/L — ABNORMAL HIGH (ref 38–126)
Anion gap: 15 (ref 5–15)
BUN: 17 mg/dL (ref 6–20)
CO2: 23 mmol/L (ref 22–32)
Calcium: 8.1 mg/dL — ABNORMAL LOW (ref 8.9–10.3)
Chloride: 81 mmol/L — ABNORMAL LOW (ref 98–111)
Creatinine, Ser: 0.89 mg/dL (ref 0.61–1.24)
GFR, Estimated: >60 mL/min (ref >60)
Glucose, Bld: 93 mg/dL (ref 70–99)
Potassium: 4 mmol/L (ref 3.5–5.1)
Sodium: 119 mmol/L — CL (ref 135–145)
Total Bilirubin: 0.7 mg/dL (ref 0.3–1.2)
Total Protein: 5.4 g/dL — ABNORMAL LOW (ref 6.5–8.1)

## 2021-11-27 LAB — CBC WITH DIFFERENTIAL/PLATELET
Abs Immature Granulocytes: 0.6 10*3/uL — ABNORMAL HIGH (ref 0.00–0.07)
Band Neutrophils: 3 %
Basophils Absolute: 0 10*3/uL (ref 0.0–0.1)
Basophils Relative: 0 %
Eosinophils Absolute: 0.6 10*3/uL — ABNORMAL HIGH (ref 0.0–0.5)
Eosinophils Relative: 1 %
HCT: 34.6 % — ABNORMAL LOW (ref 39.0–52.0)
Hemoglobin: 12.2 g/dL — ABNORMAL LOW (ref 13.0–17.0)
Lymphocytes Relative: 2 %
Lymphs Abs: 1.2 10*3/uL (ref 0.7–4.0)
MCH: 31.5 pg (ref 26.0–34.0)
MCHC: 35.3 g/dL (ref 30.0–36.0)
MCV: 89.4 fL (ref 80.0–100.0)
Metamyelocytes Relative: 1 %
Monocytes Absolute: 3 10*3/uL — ABNORMAL HIGH (ref 0.1–1.0)
Monocytes Relative: 5 %
Neutro Abs: 54 10*3/uL — ABNORMAL HIGH (ref 1.7–7.7)
Neutrophils Relative %: 88 %
Platelets: 28 10*3/uL — CL (ref 150–400)
RBC: 3.87 MIL/uL — ABNORMAL LOW (ref 4.22–5.81)
RDW: 14.6 % (ref 11.5–15.5)
WBC: 59.3 10*3/uL (ref 4.0–10.5)
nRBC: 0.2 % (ref 0.0–0.2)

## 2021-11-27 LAB — BASIC METABOLIC PANEL
Anion gap: 14 (ref 5–15)
BUN: 16 mg/dL (ref 6–20)
CO2: 23 mmol/L (ref 22–32)
Calcium: 7.9 mg/dL — ABNORMAL LOW (ref 8.9–10.3)
Chloride: 83 mmol/L — ABNORMAL LOW (ref 98–111)
Creatinine, Ser: 0.88 mg/dL (ref 0.61–1.24)
GFR, Estimated: >60 mL/min (ref >60)
Glucose, Bld: 88 mg/dL (ref 70–99)
Potassium: 3.9 mmol/L (ref 3.5–5.1)
Sodium: 120 mmol/L — ABNORMAL LOW (ref 135–145)

## 2021-11-27 LAB — TROPONIN I (HIGH SENSITIVITY)
Troponin I (High Sensitivity): 2168 ng/L (ref <18)
Troponin I (High Sensitivity): 2425 ng/L (ref <18)

## 2021-11-27 LAB — CMP (CANCER CENTER ONLY)
ALT: 31 U/L (ref 0–44)
AST: 40 U/L (ref 15–41)
Albumin: 2.7 g/dL — ABNORMAL LOW (ref 3.5–5.0)
Alkaline Phosphatase: 161 U/L — ABNORMAL HIGH (ref 38–126)
Anion gap: 12 (ref 5–15)
BUN: 18 mg/dL (ref 6–20)
CO2: 25 mmol/L (ref 22–32)
Calcium: 8 mg/dL — ABNORMAL LOW (ref 8.9–10.3)
Chloride: 83 mmol/L — ABNORMAL LOW (ref 98–111)
Creatinine: 0.82 mg/dL (ref 0.61–1.24)
GFR, Estimated: >60 mL/min (ref >60)
Glucose, Bld: 93 mg/dL (ref 70–99)
Potassium: 4 mmol/L (ref 3.5–5.1)
Sodium: 120 mmol/L — ABNORMAL LOW (ref 135–145)
Total Bilirubin: 0.4 mg/dL (ref 0.3–1.2)
Total Protein: 5.6 g/dL — ABNORMAL LOW (ref 6.5–8.1)

## 2021-11-27 LAB — SEDIMENTATION RATE: Sed Rate: 1 mm/hr (ref 0–16)

## 2021-11-27 LAB — LACTIC ACID, PLASMA
Lactic Acid, Venous: 1.9 mmol/L (ref 0.5–1.9)
Lactic Acid, Venous: 1.7 mmol/L (ref 0.5–1.9)

## 2021-11-27 LAB — BRAIN NATRIURETIC PEPTIDE: B Natriuretic Peptide: 472.9 pg/mL — ABNORMAL HIGH (ref 0.0–100.0)

## 2021-11-27 LAB — PROCALCITONIN: Procalcitonin: 0.8 ng/mL

## 2021-11-27 LAB — PROTIME-INR
INR: 1.6 — ABNORMAL HIGH (ref 0.8–1.2)
Prothrombin Time: 19 seconds — ABNORMAL HIGH (ref 11.4–15.2)

## 2021-11-27 LAB — ABO/RH: ABO/RH(D): A POS

## 2021-11-27 LAB — C-REACTIVE PROTEIN: CRP: 9 mg/dL — ABNORMAL HIGH (ref <1.0)

## 2021-11-27 LAB — URIC ACID: Uric Acid, Serum: 7.6 mg/dL (ref 3.7–8.6)

## 2021-11-27 LAB — AMMONIA: Ammonia: 18 umol/L (ref 9–35)

## 2021-11-27 LAB — VITAMIN B12: Vitamin B-12: 3013 pg/mL — ABNORMAL HIGH (ref 180–914)

## 2021-11-27 LAB — TSH: TSH: 0.523 u[IU]/mL (ref 0.350–4.500)

## 2021-11-27 IMAGING — CR DG CHEST 2V
2 series · 2 of 2 positions shown · non-contrast
Comparison: [DATE]

CLINICAL DATA: Suspected sepsis

EXAM:
CHEST - 2 VIEW

[chest lat]
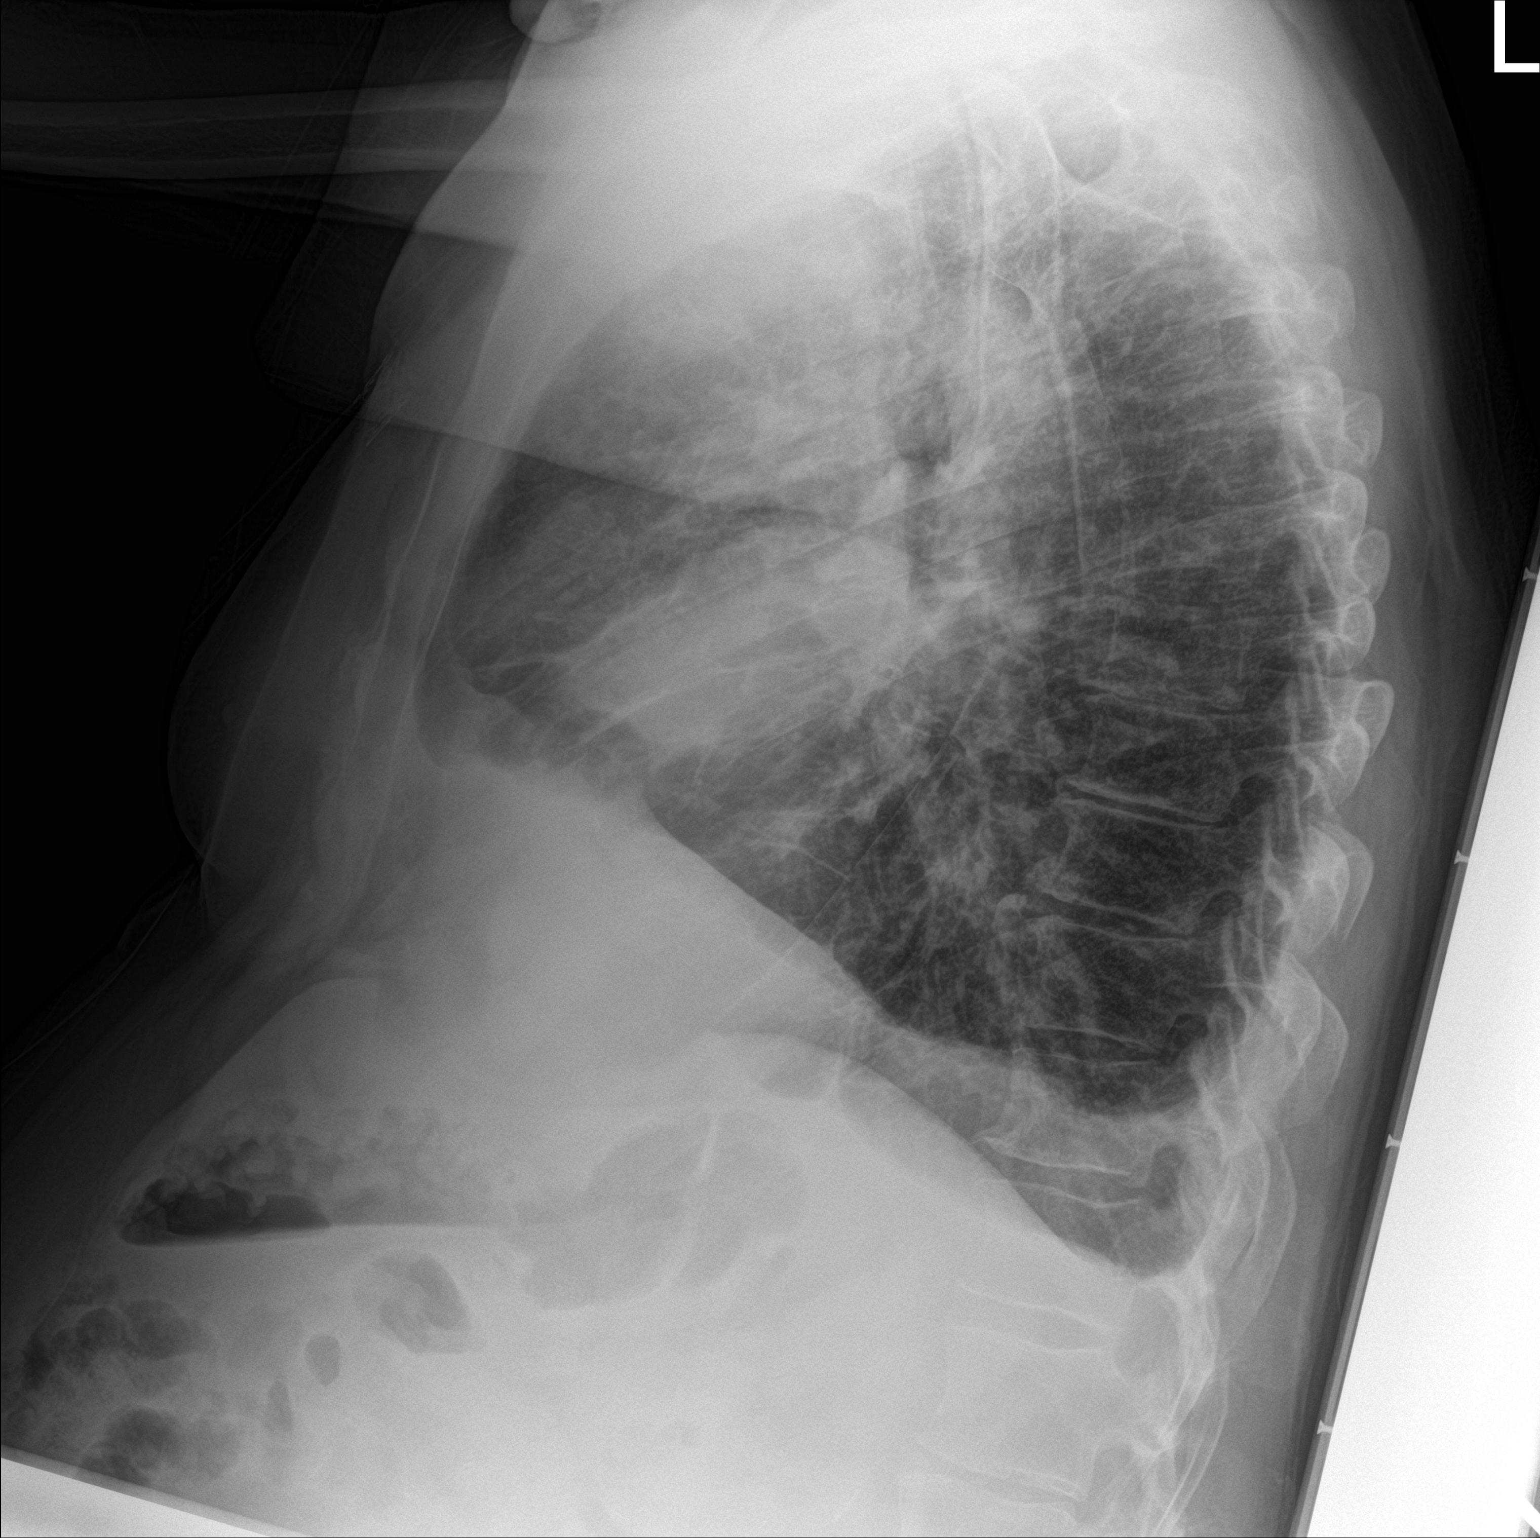

[chest ap]
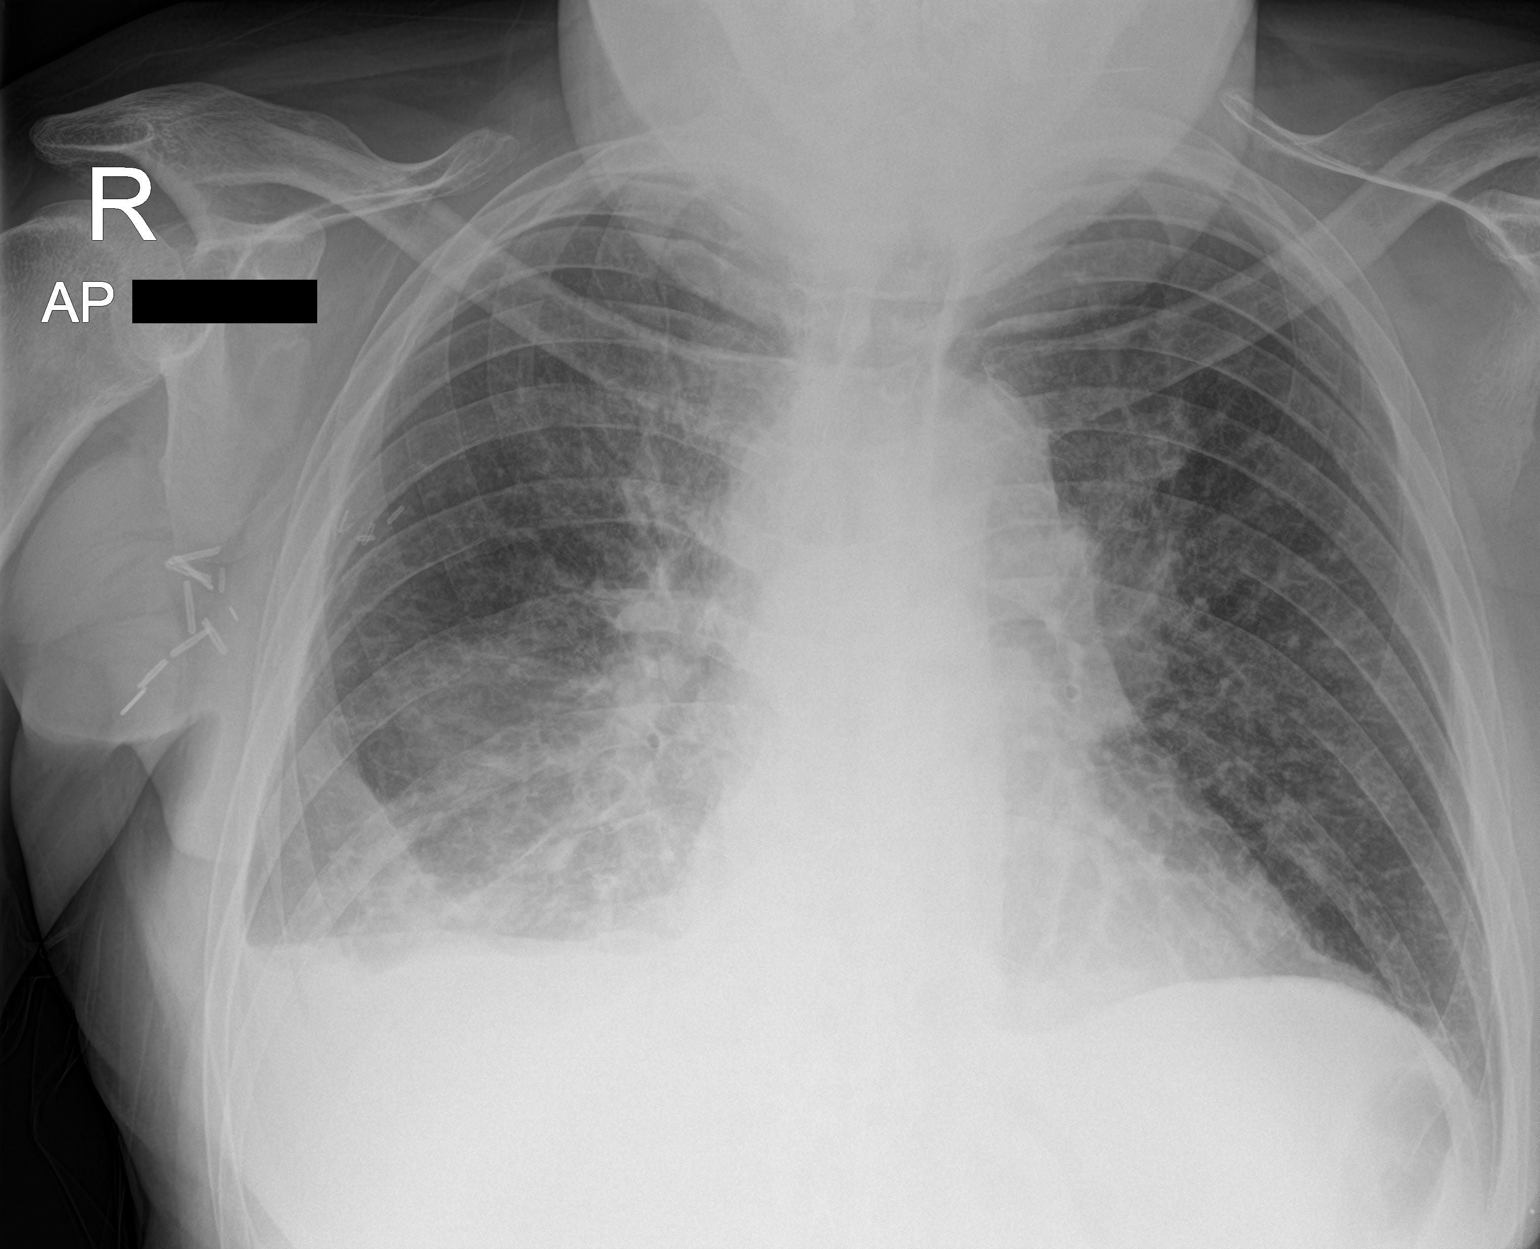

[2 of 2 positions shown; findings below may reference images not displayed]

FINDINGS: Heart is normal size. Bilateral airspace disease, most pronounced in
the right lower lobe and left upper lobe. Small right pleural
effusion. No acute bony abnormality.
IMPRESSION: Bilateral airspace disease with small right effusion. Findings are
concerning for multifocal pneumonia.

## 2021-11-27 MED ORDER — CEFAZOLIN SODIUM-DEXTROSE 2-4 GM/100ML-% IV SOLN: 2.0000 g | Freq: Once | INTRAVENOUS | Status: DC

## 2021-11-27 MED ORDER — ACETAMINOPHEN 325 MG PO TABS: 650.0000 mg | ORAL_TABLET | Freq: Four times a day (QID) | ORAL | Status: DC | PRN

## 2021-11-27 MED ORDER — POLYVINYL ALCOHOL 1.4 % OP SOLN
1.0000 [drp] | Freq: Four times a day (QID) | OPHTHALMIC | Status: DC | PRN
  Filled 2021-11-27: qty 15

## 2021-11-27 MED ORDER — ONDANSETRON HCL 4 MG/2ML IJ SOLN
4.0000 mg | Freq: Four times a day (QID) | INTRAMUSCULAR | Status: DC | PRN
  Administered 2021-12-01: 4 mg via INTRAVENOUS
  Filled 2021-11-27: qty 2

## 2021-11-27 MED ORDER — PANTOPRAZOLE SODIUM 40 MG PO TBEC
40.0000 mg | DELAYED_RELEASE_TABLET | Freq: Every day | ORAL | Status: DC
  Administered 2021-11-28 – 2021-12-04 (×7): 40 mg via ORAL
  Filled 2021-11-27 (×8): qty 1

## 2021-11-27 MED ORDER — FUROSEMIDE 10 MG/ML IJ SOLN
40.0000 mg | Freq: Once | INTRAMUSCULAR | Status: AC
  Administered 2021-11-27: 40 mg via INTRAVENOUS
  Filled 2021-11-27: qty 4

## 2021-11-27 MED ORDER — GADOBUTROL 1 MMOL/ML IV SOLN
9.0000 mL | Freq: Once | INTRAVENOUS | Status: AC | PRN
  Administered 2021-11-27: 9 mL via INTRAVENOUS

## 2021-11-27 MED ORDER — MAGIC MOUTHWASH W/LIDOCAINE
5.0000 mL | Freq: Three times a day (TID) | ORAL | Status: DC | PRN
  Administered 2021-11-30: 5 mL via ORAL
  Filled 2021-11-27 (×3): qty 5

## 2021-11-27 MED ORDER — SODIUM CHLORIDE 0.9% IV SOLUTION: Freq: Once | INTRAVENOUS | Status: DC

## 2021-11-27 MED ORDER — DEXAMETHASONE 2 MG PO TABS
1.0000 mg | ORAL_TABLET | Freq: Every day | ORAL | Status: DC
  Administered 2021-11-28 – 2021-12-04 (×7): 1 mg via ORAL
  Filled 2021-11-27: qty 2
  Filled 2021-11-27 (×2): qty 1
  Filled 2021-11-27 (×2): qty 2
  Filled 2021-11-27 (×2): qty 1
  Filled 2021-11-27 (×2): qty 2
  Filled 2021-11-27 (×2): qty 1

## 2021-11-27 MED ORDER — ASPIRIN 81 MG PO CHEW: 81.0000 mg | CHEWABLE_TABLET | Freq: Every day | ORAL | Status: DC

## 2021-11-27 MED ORDER — ACETAMINOPHEN 650 MG RE SUPP: 650.0000 mg | Freq: Four times a day (QID) | RECTAL | Status: DC | PRN

## 2021-11-27 MED ORDER — VANCOMYCIN HCL 1750 MG/350ML IV SOLN: 1750.0000 mg | INTRAVENOUS | Status: DC

## 2021-11-27 MED ORDER — ASPIRIN 325 MG PO TABS
325.0000 mg | ORAL_TABLET | Freq: Once | ORAL | Status: AC
  Administered 2021-11-28: 325 mg via ORAL
  Filled 2021-11-27: qty 1

## 2021-11-27 MED ORDER — LIDOCAINE VISCOUS HCL 2 % MT SOLN: 5.0000 mL | Freq: Every day | OROMUCOSAL | Status: DC | PRN

## 2021-11-27 MED ORDER — VITAMIN B-12 1000 MCG PO TABS
1000.0000 ug | ORAL_TABLET | Freq: Every day | ORAL | Status: DC
  Administered 2021-11-28 – 2021-12-04 (×7): 1000 ug via ORAL
  Filled 2021-11-27 (×8): qty 1

## 2021-11-27 MED ORDER — SODIUM CHLORIDE 0.9 % IV SOLN: INTRAVENOUS | Status: DC | PRN

## 2021-11-27 MED ORDER — CHLORHEXIDINE GLUCONATE CLOTH 2 % EX PADS
6.0000 | MEDICATED_PAD | Freq: Every day | CUTANEOUS | Status: DC
  Administered 2021-11-28 – 2021-12-02 (×4): 6 via TOPICAL

## 2021-11-27 MED ORDER — ONDANSETRON HCL 4 MG PO TABS: 4.0000 mg | ORAL_TABLET | Freq: Four times a day (QID) | ORAL | Status: DC | PRN

## 2021-11-27 MED ORDER — SODIUM CHLORIDE 0.9 % IV SOLN
2.0000 g | Freq: Three times a day (TID) | INTRAVENOUS | Status: DC
  Administered 2021-11-27 – 2021-11-30 (×8): 2 g via INTRAVENOUS
  Filled 2021-11-27 (×8): qty 2

## 2021-11-27 MED ORDER — FOLIC ACID 1 MG PO TABS
1.0000 mg | ORAL_TABLET | Freq: Every day | ORAL | Status: DC
  Administered 2021-11-28 – 2021-12-04 (×7): 1 mg via ORAL
  Filled 2021-11-27 (×7): qty 1

## 2021-11-27 MED ORDER — VANCOMYCIN HCL 2000 MG/400ML IV SOLN
2000.0000 mg | Freq: Once | INTRAVENOUS | Status: AC
  Administered 2021-11-28: 2000 mg via INTRAVENOUS
  Filled 2021-11-27 (×3): qty 400

## 2021-11-27 MED ORDER — CLONAZEPAM 0.5 MG PO TABS
0.5000 mg | ORAL_TABLET | Freq: Two times a day (BID) | ORAL | Status: DC | PRN
  Administered 2021-12-02 – 2021-12-03 (×2): 0.5 mg via ORAL
  Filled 2021-11-27 (×2): qty 1

## 2021-11-27 MED ORDER — HYDROCODONE-ACETAMINOPHEN 5-325 MG PO TABS
1.0000 | ORAL_TABLET | ORAL | Status: DC | PRN
  Administered 2021-11-28 – 2021-11-29 (×2): 2 via ORAL
  Administered 2021-11-30: 1 via ORAL
  Administered 2021-12-03 – 2021-12-04 (×2): 2 via ORAL
  Filled 2021-11-27: qty 2
  Filled 2021-11-27: qty 1
  Filled 2021-11-27 (×3): qty 2

## 2021-11-27 MED ORDER — FUROSEMIDE 10 MG/ML IJ SOLN
20.0000 mg | Freq: Three times a day (TID) | INTRAMUSCULAR | Status: DC
  Administered 2021-11-27 – 2021-11-28 (×2): 20 mg via INTRAVENOUS
  Filled 2021-11-27 (×2): qty 2

## 2021-11-27 MED ORDER — STROKE: EARLY STAGES OF RECOVERY BOOK
Freq: Once | Status: AC
  Filled 2021-11-27: qty 1

## 2021-11-27 NOTE — ED Notes (Signed)
RN attempted report x1. IP RN to call back in 10 minutes

## 2021-11-27 NOTE — H&P (Addendum)
History and Physical    Christopher Randall IWP:[id number] DOB: December 01, 1961 DOA: 11/27/2021  PCP: Fredirick Lathe, PA-C Consultants:  neuro-oncologist: Dr. Mickeal Skinner, oncology: Dr. Irene Limbo  Patient coming from:  Home - lives with wife and daughter   Chief Complaint: LE edema and embolic stroke   HPI: Christopher Randall is a 60 y.o. 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  They went to the cancer center today due to LE edema. His wife stated he started to have leg swelling on 10/20 with the decadron. Three days ago his legs started to weep. His wife states it is "cups of liquids." Last night it started to have yellow and blood mixed in. She went to the cancer center today and advised they come over to ER. He also had a MRI done which showed acute strokes and physician called again to make sure coming to ER.   His wife states he had a nose bleed towards the end of last week that lasted about 12 hours.   No fever at homes, but has been chilled. He has had pain over the past 2 weeks over left lower lung, he has had shortness of breath over the last 2 days and has started to cough over the last 2-3 days. It sounds wet. No chest pain or palpitations, no abdominal pain, N/V/,no dysuria or other urinary symptoms .   Wife also states he has been more confused since radiation and then 3-4 days ago he has been having increased confusion.   ED Course: vitals: afebrile, bp: 136/114, HR: 117, rr: 18, oxygen: 92% room air Pertinent labs: WBC: 67, hgb: 11.9, platelets: 29, sodium: 120, INR: 1.6,  CXR: bilateral airspace disease with small right pleural effusion, concerning for multifocal pneumonia.  MRI brain from neuro-oncology today: numerous small foci of acute to subacute infarcts involving multiple vascular territories suggestive  of a central embolic source.   Review of Systems: As per HPI; otherwise review of systems reviewed and negative.   Ambulatory Status:  Ambulates without assistance    Past Medical History:  Diagnosis Date   Abnormal TSH 09/28/2017   Anemia 11/27/2021   Anxiety 07/03/2012   Depression    Difficulty sleeping 09/28/2017   Examination of participant in clinical trial 09/11/2012   History of immunotherapy 02/23/2016   Melanoma of skin 10/26/2011   Nicotine dependence 07/17/2015   Smoker     Past Surgical History:  Procedure Laterality Date   BRONCHIAL BIOPSY  10/09/2021   Procedure: BRONCHIAL BIOPSIES;  Surgeon: [name], MD;  Location: [facility];  Service: Endoscopy;;   BRONCHIAL BRUSHINGS  10/09/2021   Procedure: BRONCHIAL BRUSHINGS;  Surgeon: [name], MD;  Location: [facility];  Service: Endoscopy;;   ENDOBRONCHIAL ULTRASOUND N/A 10/09/2021   Procedure: ENDOBRONCHIAL ULTRASOUND;  Surgeon: [name], MD;  Location: [facility];  Service: Endoscopy;  Laterality: N/A;   FINE NEEDLE ASPIRATION  10/09/2021   Procedure: FINE NEEDLE ASPIRATION (FNA) LINEAR;  Surgeon: [name], MD;  Location: [facility];  Service: Endoscopy;;   LEFT POPLITEAL MASS RESECTION  08/27/2011   pigmented villonodular synovitis, negative for malignancy   LYMPH GLAND EXCISION  07/12/2011   right axilla, consistent with melanoma   LYMPH NODE DISSECTION  08/27/2011   right, 0/17 nodes positive   VIDEO BRONCHOSCOPY N/A 10/09/2021  Procedure: VIDEO BRONCHOSCOPY WITHOUT FLUORO;  Surgeon: [name], MD;  Location: [facility];  Service: Endoscopy;  Laterality: N/A;    Social History   Socioeconomic History   Marital status: Married    Spouse name: Not on file   Number of children: 3   Years of education: Not on file   Highest education level: Not on file  Occupational History    Employer: [employer]  Tobacco Use   Smoking status: Former     Packs/day: 0.25    Types: Cigarettes   Smokeless tobacco: Never  Vaping Use   Vaping Use: Never used  Substance and Sexual Activity   Alcohol use: Yes    Alcohol/week: 6.0 standard drinks    Types: 6 Shots of liquor per week   Drug use: No   Sexual activity: Not on file  Other Topics Concern   Not on file  Social History Narrative   Not on file   Social Determinants of Health   Financial Resource Strain: Not on file  Food Insecurity: No Food Insecurity   Worried About Running Out of Food in the Last Year: Never true   Ran Out of Food in the Last Year: Never true  Transportation Needs: No Transportation Needs   Lack of Transportation (Medical): No   Lack of Transportation (Non-Medical): No  Physical Activity: Not on file  Stress: Not on file  Social Connections: Not on file  Intimate Partner Violence: Not on file    Allergies  Allergen Reactions   Iopamidol Hives and Itching    Isovue break through with prep, Hives and Itching  Patient had PET?CT scan with 125 mls of Isovue-300. Noted to have two raised itchy hives which self-resolved. Patient had PET/CT scan with contrast on 05/27/2014 while on prednisone prep (3 doses of 50 mg-13 hour prep) and noted to have two hives which self -resolved without treatment   Iodinated Diagnostic Agents Rash   Iodine-131 Rash    Family History  Problem Relation Age of Onset   Hypertension Mother    CAD Mother    Lung cancer Father     Prior to Admission medications   Medication Sig Start Date End Date Taking? Authorizing Provider  dexamethasone (DECADRON) 1 MG tablet Take 1 tablet (1 mg total) by mouth daily with breakfast. 11/24/21   Vaslow, Acey Lav, MD  Diclofenac Sodium 1.5 % SOLN Place one application on to the skin 2x daily. Patient taking differently: Place one application on to the skin twice a day as needed for muscle cramps or aches 01/15/19   Briscoe Deutscher, DO  fluconazole (DIFLUCAN) 100 MG tablet Take 2 tablets today,  then 1 tablet daily x 20 more days. 11/09/21   Eppie Gibson, MD  folic acid (FOLVITE) 1 MG tablet Take 1 tablet (1 mg total) by mouth daily. Start 7 days before pemetrexed chemotherapy. Continue until 21 days after pemetrexed completed. 11/17/21   Brunetta Genera, MD  furosemide (LASIX) 20 MG tablet Take 1 tablet (20 mg total) by mouth daily. 11/20/21   Brunetta Genera, MD  lidocaine-prilocaine (EMLA) cream Apply to affected area once 11/17/21   Brunetta Genera, MD  magic mouthwash (lidocaine, diphenhydrAMINE, alum & mag hydroxide) suspension Swish and spit 5 mls by mouth every 4-6 hours as needed 11/20/21   Brunetta Genera, MD  Multiple Vitamins-Minerals (ICAPS AREDS 2 PO) Take 1 capsule by mouth daily.    [provider]  nicotine polacrilex (COMMIT) 4 MG  lozenge Take 4 mg by mouth 2 (two) times daily as needed for smoking cessation.    [provider]  omeprazole (PRILOSEC) 20 MG capsule Take daily while on steroids. 10/16/21   Eppie Gibson, MD  ondansetron (ZOFRAN) 8 MG tablet Take 1 tablet (8 mg total) by mouth 2 (two) times daily as needed (Nausea or vomiting). Start if needed on the third day after cisplatin. 11/17/21   Brunetta Genera, MD  oseltamivir (TAMIFLU) 75 MG capsule Take 1 capsule (75 mg total) by mouth 2 (two) times daily. 10/27/21   Allwardt, Randa Evens, PA-C  OVER THE COUNTER MEDICATION Natural Calm Magnesium Powder: Dissolve 1 teaspoon into 8 oz beverage and drink daily    [provider]  prochlorperazine (COMPAZINE) 10 MG tablet Take 1 tablet (10 mg total) by mouth every 6 (six) hours as needed (Nausea or vomiting). 11/17/21   Brunetta Genera, MD    Physical Exam: Vitals:   11/27/21 1600  BP: (!) 136/114  Pulse: (!) 117  Resp: 18  Temp: 97.6 F (36.4 C)  SpO2: 92%     General:  Appears calm and comfortable and is in NAD Eyes:  PERRL, EOMI, normal lids, iris ENT:  grossly normal hearing, lips & tongue, mmm;  appropriate dentition Neck:  no LAD, masses or thyromegaly; no carotid bruits Cardiovascular:  sinus tachycardia, no m/r/g. Bilateral LE edema to above the foot. Pitting and weeping  Respiratory:   CTA bilaterally with no wheezes/rales/rhonchi.  Normal respiratory effort. Abdomen:  soft, NT, ND, NABS Back:   normal alignment, no CVAT Skin:  petechiae all over, large bruising over left arm and purpura of bilateral ankles/feet with edema and sero sanguineous weeping and peeling of skin     Musculoskeletal:  grossly normal tone BUE/BLE, good ROM, left olecranon bursa with erythema and edema Lower extremity:   Limited foot exam with no ulcerations.  2+ distal pulses. Psychiatric:  grossly normal mood and affect, speech fluent and appropriate, AOx3 Neurologic:  CN 2-12 grossly intact, moves all extremities in coordinated fashion, sensation intact. DTR 2+, gait deferred.     Radiological Exams on Admission: Independently reviewed - see discussion in A/P where applicable  DG Chest 2 View  Result Date: 11/27/2021 CLINICAL DATA:  Suspected sepsis EXAM: CHEST - 2 VIEW COMPARISON:  10/27/2021 FINDINGS: Heart is normal size. Bilateral airspace disease, most pronounced in the right lower lobe and left upper lobe. Small right pleural effusion. No acute bony abnormality. IMPRESSION: Bilateral airspace disease with small right effusion. Findings are concerning for multifocal pneumonia. Electronically Signed   By: Rolm Baptise M.D.   On: 11/27/2021 18:14   MR BRAIN W WO CONTRAST  Result Date: 11/27/2021 CLINICAL DATA:  Brain metastasis. Brain/central nervous system neoplasm; assess for treatment response. EXAM: MRI HEAD WITHOUT AND WITH CONTRAST TECHNIQUE: Multiplanar, multiecho pulse sequences of the brain and surrounding structures were obtained without and with intravenous contrast. CONTRAST:  37mL GADAVIST GADOBUTROL 1 MMOL/ML IV SOLN COMPARISON:  MR head 10/15/2021. FINDINGS: Motion artifact is  present. Brain: The largest, likely extra-axial lesion along the right falx has decreased in size measuring approximately 1.2 x 0.6 cm (previously 2.1 x 1.2 cm). Left parasagittal frontal lesion measures approximately 5 mm (series 17, image 102) is slightly smaller (previously 6 mm). The small left parietal lesion is not definitely seen. Extensive edema in the right frontal lobe has nearly resolved. Edema in the anterior left frontal lobe has resolved. No definite new mass or abnormal  enhancement. There are numerous foci of diffusion hyperintensity the cerebral hemispheres involving all but the temporal lobes as well as the cerebellar hemispheres and thalami. No hydrocephalus or extra-axial collection. Vascular: Major vessel flow voids at the skull base are preserved. Skull and upper cervical spine: Normal marrow signal is preserved. Sinuses/Orbits: Paranasal sinuses are aerated. Orbits are unremarkable. Other: Sella is unremarkable.  Mastoid air cells are clear. IMPRESSION: Motion degraded study. Positive treatment response as detailed above. No definite new metastasis. Numerous small foci of acute to subacute infarcts involving multiple vascular territories suggestive of a central embolic source. These results will be called to the ordering clinician or representative by the Radiologist Assistant, and communication documented in the PACS or [employer]. Electronically Signed   By: Macy Mis M.D.   On: 11/27/2021 13:05    EKG: pending, ordered in room   Labs on Admission: I have personally reviewed the available labs and imaging studies at the time of the admission.  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 until bedside swallow screen -a1c/lipid panel -PT/ OT/ SLP consult -DVT prophylaxis limited. No SCDs until DVT r/o, can not tolerate TED hose with skin open and weeping.   Active Problems:   Acute respiratory failure with multifocal pneumonia with sepsis criteria and small cell lung cancer  -patient satting around 87-88% on room air when I arrived. 2L [place] placed with sats back up to 95-96%. Complaining of shortness of breath, but no increased work of breathing -meets sepsis criteria with WBC to 67, tachycardia. Source of pneumonia vs. Possible cellulitis and endocarditis on differential as well.  -broad spectrum abx with vancomycin and cefepime for the above, tailor as indicated -procalcitonin pending -respiratory PCR panel pending. Recently had flu and covid in November.  -blood cultures pending -sputum culture ordered -urinary antigens -bnp pending  -pccm consult  -wean oxygen as tolerated     Hyponatremia Sepsis vs. SIADH from small cell lung cancer Is having hallucinations/confusion but could be multifactorial  Urine studies and TSH pending Repeat bmp at 22:00 PCCM has been consulted   Thrombocytopenia ([place]) Secondary to chemo? Vs. Infection?  No DVT or antiplatelet prophylaxis Hematology consulted  Trend and monitor   Cellulitis vs. Vasculitis vs. Drug side effect  -concern for purpura vs. Vasculitis vs. Cellulitis vs. Steroid side effect  -on broad spectrum abx and blood cultures pending  -inflammatory markers have been ordered -bilateral DVT studies ordered  -wound care  -received one dose of 40mg   lasix in ED. Will hold on further lasix and watch intake and output and adjust as needed.     Leukocytosis Multifactorial. Concern for sepsis Had pegfilgrastim on 11/23/21  and currently on dexamethasone.  Treatment per above, trend.  Hematology consulted.   Left Olecranon bursitis vs. Gout  Checking uric acid/inflammatory markers  Would think dexamethasone would help this  Hallucinations and confusion Again, multifactorial with recent strokes/cancer/hyponatremia/dexamethasone and now sepsis -delirium precautions -w/u for other issues per above -check metabolic labs    Lung cancer metastatic to brain [facility]) Hematology has been consulted  Patient is on Treatment Plan : LUNG CARBOplatin / Pemetrexed / Pembrolizumab q21d Induction x 4 cycles / Maintenance Pemetrexed + Pembrolizumab with first dose 11/20/21. Received pegfilgrastim-cbqv injection 11/23/21.  -continue dexamethasone, folic acid, diflucan    Anemia At baseline, monitor    Anxiety  He is anxious with everything going on, rightly so. Apparently on no medication, starting klonopin .5mg  BID prn. Monitor in setting of confusion.   ___> EKG came back with STEMI. STEMI doctor notified and me as well. Transferring to ICU with thrombocytopenia in setting of STEMI. stat Echo orderded as well.   There is no height or weight on file to calculate BMI.   Level of care: Progressive DVT prophylaxis:  no DVT ppx w/ platelets of 28 and r/o DVT. TED hose will not be tolerating with weeping skin.  Code Status:  Full - confirmed with patient Family Communication:  wife at bedside: Malena Catholic  Disposition Plan:  The patient is from: home  Anticipated d/c is to: per day team   Requires inpatient hospitalization and is at significant risk of neurological and medically worsening, requires constant monitoring, IV medication, assessment and MDM with specialists.    Patient is currently: acutely ill Consults called: oncology, neurology, pccm--> taking over care. And cardiology  Admission status:  inpatient    Orma Flaming MD [facility]   How to contact the [facility] Attending or Consulting provider  [place] or covering provider during after hours [place], for this patient?  Check the care team in [facility] and look for a) attending/consulting [facility] provider listed and b) the [facility] team listed Log into [email or web address] and use [place]'s universal password to access. If you do not have the password, please contact the hospital operator. Locate the [facility] provider you are looking for under [facility] and page to a number that you can be directly reached. If you still have difficulty reaching the provider, please page the [facility] (Director on Call) for the Hospitalists listed on amion for assistance.   11/27/2021, 8:14 PM

## 2021-11-27 NOTE — Progress Notes (Signed)
eLink Physician-Brief Progress Note Patient Name: Christopher Randall DOB: [birth year] MRN: [id number]   Date of Service  11/27/2021  HPI/Events of Note  60 yr old male with metastatic nonsmall cell lung ca with brain mets undergoing chemotherapy brought into hospital with multiple concerns.  Evaluation has revealed MRI with subacute and acute infarcts, increasing infiltrate/atx and effusion on cxr, concern for lower ext cellulitis, profound leukocytosis, acute EKG changes concerning for acute MI, thrombocytopenia, and hyponatremia.  He is now being admitted to the ICU for further evaluation and management.  Cardiology and neurology following.  Presently on room air oxygen.  eICU Interventions  Chart reviewed.   Abx have been given Echo ordered  Serial chemistries Supportive care     Intervention Category Evaluation Type: New Patient Evaluation  Mauri Brooklyn, P 11/27/2021, 10:49 PM

## 2021-11-27 NOTE — Discharge Instructions (Addendum)
Information on my medicine - ELIQUIS (apixaban)  This medication education was reviewed with me or my healthcare representative as part of my discharge preparation.  Why was Eliquis prescribed for you? Eliquis was prescribed to treat blood clots that may have been found in the veins of your legs (deep vein thrombosis) or in your lungs (pulmonary embolism) and to reduce the risk of them occurring again.  What do You need to know about Eliquis ? The dose is ONE 5 mg tablet taken TWICE daily.  Eliquis may be taken with or without food.   Try to take the dose about the same time in the morning and in the evening. If you have difficulty swallowing the tablet whole please discuss with your pharmacist how to take the medication safely.  Take Eliquis exactly as prescribed and DO NOT stop taking Eliquis without talking to the doctor who prescribed the medication.  Stopping may increase your risk of developing a new blood clot.  Refill your prescription before you run out.  After discharge, you should have regular check-up appointments with your healthcare provider that is prescribing your Eliquis.    What do you do if you miss a dose? If a dose of ELIQUIS is not taken at the scheduled time, take it as soon as possible on the same day and twice-daily administration should be resumed. The dose should not be doubled to make up for a missed dose.  Important Safety Information A possible side effect of Eliquis is bleeding. You should call your healthcare provider right away if you experience any of the following: Bleeding from an injury or your nose that does not stop. Unusual colored urine (red or dark brown) or unusual colored stools (red or black). Unusual bruising for unknown reasons. A serious fall or if you hit your head (even if there is no bleeding).  Some medicines may interact with Eliquis and might increase your risk of bleeding or clotting while on Eliquis. To help avoid this,  consult your healthcare provider or pharmacist prior to using any new prescription or non-prescription medications, including herbals, vitamins, non-steroidal anti-inflammatory drugs (NSAIDs) and supplements.  This website has more information on Eliquis (apixaban): http://www.eliquis.com/eliquis/home

## 2021-11-27 NOTE — Progress Notes (Addendum)
Symptom Management Consult note [place]    Patient Care Team: Allwardt, Nicholes Rough as PCP - General (Physician Assistant)    Name of the patient: Christopher Randall  [id number]  1961/12/01   Date of visit: 11/27/2021    Chief complaint/ Reason for visit- bilateral foot swelling and pain  Oncology History  Lung cancer metastatic to brain [facility])  10/09/2021 Initial Diagnosis   Lung cancer metastatic to brain ([place])   11/20/2021 -  Chemotherapy   Patient is on Treatment Plan : LUNG CARBOplatin / Pemetrexed / Pembrolizumab q21d Induction x 4 cycles / Maintenance Pemetrexed + Pembrolizumab       Current Therapy: chemotherapy carboplatin, pemetrexed, pembrolizuma with last treatment 11/20/21. Received pegfilgrastim-cbqv injection 11/23/21  Interval history- Christopher Randall. Christopher Randall is a 60 yo male with history of lung cancer metastatic to brain presenting to [facility] today accompanied by his spouse with chief complaint of bilateral foot swelling and pain x 1 week. Spouse reports symptoms started after patient started decadron taper. On 11/18/21 he was instructed to take half tablet daily until 12/3, then to state half tablet every other day and stop taking them completely 11/29/21. Over the last 3 days the swelling in his legs has significantly worsened, he is having trouble ambulating because of the pain, and noticed increased drainage for new leg wounds. He describes pain as aching and rates it 7/10 in severity. Pain is constant. Swelling has not improved with elevation per patient. Spouse noticed today the wounds had a sour odor to it, this is new. The drainage is yellow as well as bloody. He has not been taking antibiotics for the wounds. Patient informed oncologist of symptoms and was prescribed Lasix 20mg  daily. He reports compliance with medication. He has noticed increase urine output since starting lasix. Denies symptoms of UTI. Does reports decreased appetite since symptoms  started. Denies fever, chills, chest pain, shortness of breath,   ROS  All other systems are reviewed and are negative for acute change except as noted in the HPI.    Allergies  Allergen Reactions   Iopamidol Hives and Itching    Isovue break through with prep, Hives and Itching  Patient had PET?CT scan with 125 mls of Isovue-300. Noted to have two raised itchy hives which self-resolved. Patient had PET/CT scan with contrast on 05/27/2014 while on prednisone prep (3 doses of 50 mg-13 hour prep) and noted to have two hives which self -resolved without treatment   Iodinated Diagnostic Agents Rash   Iodine-131 Rash     Past Medical History:  Diagnosis Date   Abnormal TSH 09/28/2017   Anxiety 07/03/2012   Depression    Difficulty sleeping 09/28/2017   Examination of participant in clinical trial 09/11/2012   History of immunotherapy 02/23/2016   Melanoma of skin 10/26/2011   Nicotine dependence 07/17/2015   Smoker      Past Surgical History:  Procedure Laterality Date   BRONCHIAL BIOPSY  10/09/2021   Procedure: BRONCHIAL BIOPSIES;  Surgeon: [name], MD;  Location: [facility];  Service: Endoscopy;;   BRONCHIAL BRUSHINGS  10/09/2021   Procedure: BRONCHIAL BRUSHINGS;  Surgeon: [name], MD;  Location: [facility];  Service: Endoscopy;;   ENDOBRONCHIAL ULTRASOUND N/A 10/09/2021   Procedure: ENDOBRONCHIAL ULTRASOUND;  Surgeon: [name], MD;  Location: [facility];  Service: Endoscopy;  Laterality: N/A;   FINE NEEDLE ASPIRATION  10/09/2021   Procedure: FINE NEEDLE ASPIRATION (FNA) LINEAR;  Surgeon: Sherrilyn Rist  A, MD;  Location: [facility];  Service: Endoscopy;;   LEFT POPLITEAL MASS RESECTION  08/27/2011   pigmented villonodular synovitis, negative for malignancy   LYMPH GLAND EXCISION  07/12/2011   right axilla, consistent with melanoma   LYMPH NODE DISSECTION  08/27/2011   right, 0/17 nodes positive   VIDEO BRONCHOSCOPY N/A 10/09/2021    Procedure: VIDEO BRONCHOSCOPY WITHOUT FLUORO;  Surgeon: [name], MD;  Location: [facility];  Service: Endoscopy;  Laterality: N/A;    Social History   Socioeconomic History   Marital status: Married    Spouse name: Not on file   Number of children: 3   Years of education: Not on file   Highest education level: Not on file  Occupational History    Employer: [employer]  Tobacco Use   Smoking status: Former    Packs/day: 0.25    Types: Cigarettes   Smokeless tobacco: Never  Vaping Use   Vaping Use: Never used  Substance and Sexual Activity   Alcohol use: Yes    Alcohol/week: 6.0 standard drinks    Types: 6 Shots of liquor per week   Drug use: No   Sexual activity: Not on file  Other Topics Concern   Not on file  Social History Narrative   Not on file   Social Determinants of Health   Financial Resource Strain: Not on file  Food Insecurity: No Food Insecurity   Worried About Running Out of Food in the Last Year: Never true   Ran Out of Food in the Last Year: Never true  Transportation Needs: No Transportation Needs   Lack of Transportation (Medical): No   Lack of Transportation (Non-Medical): No  Physical Activity: Not on file  Stress: Not on file  Social Connections: Not on file  Intimate Partner Violence: Not on file    Family History  Problem Relation Age of Onset   Hypertension Mother    CAD Mother    Lung cancer Father      Current Outpatient Medications:    dexamethasone (DECADRON) 1 MG tablet, Take 1 tablet (1 mg total) by mouth daily with breakfast., Disp: 30 tablet, Rfl: 1   Diclofenac Sodium 1.5 % SOLN, Place one application on to the skin 2x daily. (Patient taking differently: Place one application on to the skin twice a day as needed for muscle cramps or aches), Disp: 1.5 Bottle, Rfl: 1   fluconazole (DIFLUCAN) 100 MG tablet, Take 2 tablets today, then 1 tablet daily x 20 more days., Disp: 35 tablet, Rfl: 0   folic acid (FOLVITE) 1 MG  tablet, Take 1 tablet (1 mg total) by mouth daily. Start 7 days before pemetrexed chemotherapy. Continue until 21 days after pemetrexed completed., Disp: 100 tablet, Rfl: 3   furosemide (LASIX) 20 MG tablet, Take 1 tablet (20 mg total) by mouth daily., Disp: 30 tablet, Rfl: 0   lidocaine-prilocaine (EMLA) cream, Apply to affected area once, Disp: 30 g, Rfl: 3   magic mouthwash (lidocaine, diphenhydrAMINE, alum & mag hydroxide) suspension, Swish and spit 5 mls by mouth every 4-6 hours as needed, Disp: 240 mL, Rfl: 2   Multiple Vitamins-Minerals (ICAPS AREDS 2 PO), Take 1 capsule by mouth daily., Disp: , Rfl:    nicotine polacrilex (COMMIT) 4 MG lozenge, Take 4 mg by mouth 2 (two) times daily as needed for smoking cessation., Disp: , Rfl:    omeprazole (PRILOSEC) 20 MG capsule, Take daily while on steroids., Disp: 60 capsule, Rfl: 0  ondansetron (ZOFRAN) 8 MG tablet, Take 1 tablet (8 mg total) by mouth 2 (two) times daily as needed (Nausea or vomiting). Start if needed on the third day after cisplatin., Disp: 30 tablet, Rfl: 1   oseltamivir (TAMIFLU) 75 MG capsule, Take 1 capsule (75 mg total) by mouth 2 (two) times daily., Disp: 10 capsule, Rfl: 0   OVER THE COUNTER MEDICATION, Natural Calm Magnesium Powder: Dissolve 1 teaspoon into 8 oz beverage and drink daily, Disp: , Rfl:    prochlorperazine (COMPAZINE) 10 MG tablet, Take 1 tablet (10 mg total) by mouth every 6 (six) hours as needed (Nausea or vomiting)., Disp: 30 tablet, Rfl: 1  PHYSICAL EXAM: ECOG FS:2 - Symptomatic, <50% confined to bed    Vitals:   11/27/21 1042  BP: 126/87  Pulse: (!) 107  Resp: 17  Temp: 98.4 F (36.9 C)  TempSrc: Oral  SpO2: 93%  Weight: 203 lb 12.8 oz (92.4 kg)  Height: 5\' 9"  (1.753 m)   Physical Exam Vitals and nursing note reviewed.  Constitutional:      Appearance: He is well-developed. He is not ill-appearing or toxic-appearing.  HENT:     Head: Normocephalic and atraumatic.     Nose: Nose normal.   Eyes:     General: No scleral icterus.       Right eye: No discharge.        Left eye: No discharge.     Conjunctiva/sclera: Conjunctivae normal.  Neck:     Vascular: No JVD.  Cardiovascular:     Rate and Rhythm: Regular rhythm. Tachycardia present.     Pulses: Normal pulses.     Heart sounds: Normal heart sounds.  Pulmonary:     Effort: Pulmonary effort is normal.     Breath sounds: Normal breath sounds.  Abdominal:     General: There is no distension.  Musculoskeletal:        General: Normal range of motion.     Cervical back: Normal range of motion.  Feet:     Comments: Please see media below.  Patient gave verbal permission to utilize photo for medical documentation only   Tibial and pedal bilateral 3+ pitting edema. Open wounds noted to bilateral shins with serosanguineous drainage with foul odor. Sheet under feet on wheelchair is saturated Skin:    General: Skin is warm and dry.     Capillary Refill: Capillary refill takes less than 2 seconds.     Comments: Equal tactile temperature in all extremities.  Bilateral lower extremities are erythematous. Skin is thickened and darkened on ankles and calves which is new per patient  Neurological:     Mental Status: He is oriented to person, place, and time.     GCS: GCS eye subscore is 4. GCS verbal subscore is 5. GCS motor subscore is 6.     Comments: Fluent speech, no facial droop.  Psychiatric:        Mood and Affect: Affect is flat.        Behavior: Behavior normal.                 LABORATORY DATA: I have reviewed the data as listed CBC Latest Ref Rng & Units 11/27/2021 11/24/2021 11/20/2021  WBC 4.0 - 10.5 K/uL 67.0(HH) 52.5(HH) 22.4(H)  Hemoglobin 13.0 - 17.0 g/dL 11.9(L) 12.2(L) 12.4(L)  Hematocrit 39.0 - 52.0 % 34.0(L) 35.1(L) 35.8(L)  Platelets 150 - 400 K/uL 29(L) 34(L) 62(L)     CMP Latest Ref Rng & Units 11/27/2021  11/24/2021 11/20/2021  Glucose 70 - 99 mg/dL 93 101(H) 120(H)  BUN 6 - 20 mg/dL 18  20 15   Creatinine 0.61 - 1.24 mg/dL 0.82 0.82 0.72  Sodium 135 - 145 mmol/L 120(L) 127(L) 131(L)  Potassium 3.5 - 5.1 mmol/L 4.0 4.3 4.0  Chloride 98 - 111 mmol/L 83(L) 89(L) 95(L)  CO2 22 - 32 mmol/L 25 23 23   Calcium 8.9 - 10.3 mg/dL 8.0(L) 8.2(L) 8.3(L)  Total Protein 6.5 - 8.1 g/dL 5.6(L) 5.8(L) 6.1(L)  Total Bilirubin 0.3 - 1.2 mg/dL 0.4 0.6 0.5  Alkaline Phos 38 - 126 U/L 161(H) 87 74  AST 15 - 41 U/L 40 38 28  ALT 0 - 44 U/L 31 38 53(H)       RADIOGRAPHIC STUDIES: I have personally reviewed the radiological images as listed and agreed with the findings in the report.    MR BRAIN W WO CONTRAST  Result Date: 11/27/2021 CLINICAL DATA:  Brain metastasis. Brain/central nervous system neoplasm; assess for treatment response. EXAM: MRI HEAD WITHOUT AND WITH CONTRAST TECHNIQUE: Multiplanar, multiecho pulse sequences of the brain and surrounding structures were obtained without and with intravenous contrast. CONTRAST:  76mL GADAVIST GADOBUTROL 1 MMOL/ML IV SOLN COMPARISON:  MR head 10/15/2021. FINDINGS: Motion artifact is present. Brain: The largest, likely extra-axial lesion along the right falx has decreased in size measuring approximately 1.2 x 0.6 cm (previously 2.1 x 1.2 cm). Left parasagittal frontal lesion measures approximately 5 mm (series 17, image 102) is slightly smaller (previously 6 mm). The small left parietal lesion is not definitely seen. Extensive edema in the right frontal lobe has nearly resolved. Edema in the anterior left frontal lobe has resolved. No definite new mass or abnormal enhancement. There are numerous foci of diffusion hyperintensity the cerebral hemispheres involving all but the temporal lobes as well as the cerebellar hemispheres and thalami. No hydrocephalus or extra-axial collection. Vascular: Major vessel flow voids at the skull base are preserved. Skull and upper cervical spine: Normal marrow signal is preserved. Sinuses/Orbits: Paranasal sinuses are  aerated. Orbits are unremarkable. Other: Sella is unremarkable.  Mastoid air cells are clear. IMPRESSION: Motion degraded study. Positive treatment response as detailed above. No definite new metastasis. Numerous small foci of acute to subacute infarcts involving multiple vascular territories suggestive of a central embolic source. These results will be called to the ordering clinician or representative by the Radiologist Assistant, and communication documented in the PACS or [employer]. Electronically Signed   By: Macy Mis M.D.   On: 11/27/2021 13:05     ASSESSMENT & PLAN: Patient is a 60 y.o. male with history of lung cancer with metastatic to brain followed by oncologist Dr. Irene Limbo.  #) Leg swelling- Exam with bilateral 3+ pitting pretibial edema. There are open wounds seen and active serosanguineous drainage. Patient has been taking PO lasix without improvement, concern for failure of PO diuresis. Patient would benefit from IV diuresis and antibiotics with concern for cellulitis.  #)Tachycardia-Heart rate 107 today. Normotensive. Patient was noted to be tachycardic at last visit as well. He is afebrile, non toxic appearing although looks to not feel well. He has no symptoms of chest pain or shortness of breath.  #) Leukocytosis- CBC today with WBC 67.0, patient did receive pegfilgrastim 11/23/21. CBC x 3 days ago also with leukocytosis 52.5. This is likely multifactorial with recent bone marrow stimulant, currently on dexamethasone and cellulitis seen on physical exam.  #) Thrombocytopenia- Platelet count today is 29, compared to labs x 3  days ago 34. Discussed this with oncologist and patient had recent viral infections which could be contributing to thrombocytopenia.  #) Electrolyte abnormalities- CMP with hyponatremia 120, looks to be trending down from labs drawn over the last week. Also has hypocalcemia 8.0 which has also been trending down on previous labs. No renal insufficiency  seen on labs.  Discussed patient with oncologist Dr. Irene Limbo who agrees with need for admission for IV diuresis, IV antibiotics, and r/o bilateral DVT. Patient direct admitted by hospitalist Dr. Marylyn Ishihara. Patient and spouse prefer to wait for bed at home as there are none currently available. Discussed ED precautions should patient develop new or worsening symptoms while waiting for admission.  Update as of 3:13 PM Patient had outpatient MR brain this AM. Imaging resulted after patient had left [facility] clinic. Imaging is concerning for bilateral multiple acute and subacute CVAs. I informed hospitalist Dr. Marylyn Ishihara who recommends patient present to [facility] ED for evaluation. Dr. Irene Limbo called and discussed results with patient and spouse and they will go to [facility] ED via EMS. Please see he progress note from today.    Visit Diagnosis: 1. Skin infection   2. Hyponatremia   3. Thrombocytopenia ([place])   4. Lung cancer metastatic to brain ([place])   5. Swelling of lower extremity   6. Cerebrovascular accident (CVA), unspecified mechanism ([place])      No orders of the defined types were placed in this encounter.  I have spent a total of 40 minutes minutes of face-to-face and non-face-to-face time, preparing to see the patient, obtaining and/or reviewing separately obtained history, performing a medically appropriate examination, counseling and educating the patient, ordering tests,  documenting clinical information in the electronic health record, and care coordination.     Thank you for allowing me to participate in the care of this patient.    Barrie Folk, PA-C Department of Hematology/Oncology [facility] Phone: [phone]  [phone]    11/27/2021 2:11 PM

## 2021-11-27 NOTE — ED Triage Notes (Signed)
Pt sent here by neurologist via [place] for changes in MRI (head) since last one a few months ago. Pt has hx of TIAs, not on a blood thinner. Pt has lung CA and is being seen by neuro for possible metastasis vs brain tumor.

## 2021-11-27 NOTE — Progress Notes (Signed)
Patient was seen today in our symptom management clinic for increasing bilateral leg swelling and weeping leg wounds with secondary cellulitis. He was set up for direct admit to the hospitalist service and case was discussed with Dr. Cherylann Ratel.  He had previously seen Dr. Mickeal Skinner for intermittent confusion and had an MRI of the brain with and without contrast ordered which she had done earlier today.  Results show treated metastases have improved but there is evidence of bilateral multiple acute and subacute CVA's. Hospitalist suggest retriaging the patient to the [facility] emergency room.  I called the patient's wife Sharyn Lull and recommended that instead of waiting for the direct admit she called 911 and take the patient to the [facility] emergency room for evaluation of multiple acute and subacute CVAs. Patient's wife noted the understanding of the situation and will follow through on it. Have messaged Dr. Mickeal Skinner to keep him in the loop as well.  Sullivan Lone MD MS

## 2021-11-27 NOTE — ED Notes (Signed)
Rn attempted IV, unsuccessful. IV team notified

## 2021-11-27 NOTE — ED Triage Notes (Signed)
Patient sent to [facility] for evaluation of hallucinations that PCP believed may be caused by TIA per wife as reported on a recent MRI. Wife also reports concern for weeping edema and cellulitis in bilateral lower extremities. Patient alert, disoriented, and ill-appearing.

## 2021-11-27 NOTE — Consult Note (Signed)
Cardiology Consultation:   Patient ID: Christopher Randall MRN: [id number]; DOB: 1961-07-13  Admit date: 11/27/2021 Date of Consult: 11/27/2021  PCP:  Fredirick Lathe, PA-C   [facility] Providers Cardiologist:  None        Patient Profile:   Christopher Randall is a 60 y.o. 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  History of Present Illness:   [name] has been developing increasing dyspnea and lower extremity over the past week but especially over the past day. Lower extremities would weep and actually sometimes have discoloration and blood. Also with increasing confusion and word finding challenges; obtained MRI brain which showed possible strokes. Asked to come in to the Ed for both CVA workup and edema/cellulitis. Workup in the ED notable for WBC of 59.3 (previous 24.9 in setting of filgastrim. PLT 28, newly low this month in the setting of infections and chemotherapy. Na 127. CRP 9. LA normal Ekg obtained with inferolateral STEMI pattern with q waves. Repeat with progression of STEMI and increased qing. First troponin 2168. Cardiology consulted   No chest pain; no prior MI. Dyspnea, orthopnea. Having prolonged episodes of epistaxis Neurology consulted, concern for embolic Cva from endocarditis vs from hypercoagulable state  Past Medical History:  Diagnosis Date   Abnormal TSH 09/28/2017   Anemia 11/27/2021   Anxiety 07/03/2012   Depression    Difficulty sleeping 09/28/2017   Examination of participant in clinical trial 09/11/2012   History of immunotherapy 02/23/2016   Melanoma of skin 10/26/2011   Nicotine dependence 07/17/2015   Smoker    STEMI (ST elevation myocardial infarction) ([place]) 11/27/2021    Past Surgical History:  Procedure Laterality Date   BRONCHIAL BIOPSY  10/09/2021   Procedure: BRONCHIAL  BIOPSIES;  Surgeon: [name], MD;  Location: [facility];  Service: Endoscopy;;   BRONCHIAL BRUSHINGS  10/09/2021   Procedure: BRONCHIAL BRUSHINGS;  Surgeon: [name], MD;  Location: [facility];  Service: Endoscopy;;   ENDOBRONCHIAL ULTRASOUND N/A 10/09/2021   Procedure: ENDOBRONCHIAL ULTRASOUND;  Surgeon: [name], MD;  Location: [facility];  Service: Endoscopy;  Laterality: N/A;   FINE NEEDLE ASPIRATION  10/09/2021   Procedure: FINE NEEDLE ASPIRATION (FNA) LINEAR;  Surgeon: [name], MD;  Location: [facility];  Service: Endoscopy;;   LEFT POPLITEAL MASS RESECTION  08/27/2011   pigmented villonodular synovitis, negative for malignancy   LYMPH GLAND EXCISION  07/12/2011   right axilla, consistent with melanoma   LYMPH NODE DISSECTION  08/27/2011   right, 0/17 nodes positive   VIDEO BRONCHOSCOPY N/A 10/09/2021   Procedure: VIDEO BRONCHOSCOPY WITHOUT FLUORO;  Surgeon: [name], MD;  Location: [facility];  Service: Endoscopy;  Laterality: N/A;       Inpatient Medications: Scheduled Meds:   stroke: mapping our early stages of recovery book   Does not apply Once   sodium chloride   Intravenous Once   [START ON 11/28/2021] Chlorhexidine Gluconate Cloth  6 each Topical Daily   [START ON 11/28/2021] dexamethasone  1 mg Oral Q breakfast   [START ON 01/60/1093] folic acid  1 mg Oral Daily   [START ON 11/28/2021] pantoprazole  40 mg Oral Daily   [START ON 11/28/2021] vitamin B-12  1,000 mcg Oral Daily   Continuous Infusions:  ceFEPime (MAXIPIME) IV     [START ON 11/28/2021]  vancomycin     vancomycin     PRN Meds: acetaminophen **OR** acetaminophen, clonazePAM, HYDROcodone-acetaminophen, magic mouthwash w/lidocaine, ondansetron **OR** ondansetron (ZOFRAN) IV, polyvinyl alcohol  Allergies:    Allergies  Allergen Reactions   Iopamidol Hives and Itching    Isovue break through with prep, Hives and Itching  Patient had PET?CT scan with  125 mls of Isovue-300. Noted to have two raised itchy hives which self-resolved. Patient had PET/CT scan with contrast on 05/27/2014 while on prednisone prep (3 doses of 50 mg-13 hour prep) and noted to have two hives which self -resolved without treatment   Iodinated Diagnostic Agents Rash   Iodine-131 Rash    Social History:   Social History   Socioeconomic History   Marital status: Married    Spouse name: Not on file   Number of children: 3   Years of education: Not on file   Highest education level: Not on file  Occupational History    Employer: [employer]  Tobacco Use   Smoking status: Former    Packs/day: 0.25    Types: Cigarettes   Smokeless tobacco: Never  Vaping Use   Vaping Use: Never used  Substance and Sexual Activity   Alcohol use: Yes    Alcohol/week: 6.0 standard drinks    Types: 6 Shots of liquor per week   Drug use: No   Sexual activity: Not on file  Other Topics Concern   Not on file  Social History Narrative   Not on file   Social Determinants of Health   Financial Resource Strain: Not on file  Food Insecurity: No Food Insecurity   Worried About Running Out of Food in the Last Year: Never true   Ran Out of Food in the Last Year: Never true  Transportation Needs: No Transportation Needs   Lack of Transportation (Medical): No   Lack of Transportation (Non-Medical): No  Physical Activity: Not on file  Stress: Not on file  Social Connections: Not on file  Intimate Partner Violence: Not on file    Family History:    Family History  Problem Relation Age of Onset   Hypertension Mother    CAD Mother    Lung cancer Father      ROS:  Please see the history of present illness.   All other ROS reviewed and negative.     Physical Exam/Data:   Vitals:   11/27/21 1600 11/27/21 2000  BP: (!) 136/114   Pulse: (!) 117   Resp: 18   Temp: 97.6 F (36.4 C) 98.5 F (36.9 C)  TempSrc:  Oral  SpO2: 92%    No intake or output data in the 24  hours ending 11/27/21 2302 Last 3 Weights 11/27/2021 11/24/2021 11/20/2021  Weight (lbs) 203 lb 12.8 oz 204 lb 12.8 oz 204 lb  Weight (kg) 92.443 kg 92.897 kg 92.534 kg     There is no height or weight on file to calculate BMI.  General:  Well nourished, well developed, in no acute distress.  HEENT: normal Neck: JVP to mandible at 90 degrees Vascular: No carotid bruits; Distal pulses 2+ bilaterally Cardiac:  tachycardic to 120s, S1, S2; RRR; no murmur Lungs:  clear to auscultation bilaterally, no wheezing, rhonchi or rales  Abd: soft, mildly distended Ext: 3+ edema Musculoskeletal:  No deformities, BUE and BLE strength normal and equal Skin: warm and dry  Neuro:  word findings difficulties Psych:  Normal affect   EKG:  The EKG was personally reviewed and  demonstrates:  inferolateral ST elevations Telemetry:  Telemetry was personally reviewed and demonstrates:  sinus tachycardia  Relevant CV Studies:   Laboratory Data:  High Sensitivity Troponin:   Recent Labs  Lab 11/27/21 1927  TROPONINIHS 2,168*     Chemistry Recent Labs  Lab 11/27/21 1142 11/27/21 1719 11/27/21 1955  NA 120* 119* 120*  K 4.0 4.0 3.9  CL 83* 81* 83*  CO2 25 23 23   GLUCOSE 93 93 88  BUN 18 17 16   CREATININE 0.82 0.89 0.88  CALCIUM 8.0* 8.1* 7.9*  GFRNONAA >60 >60 >60  ANIONGAP 12 15 14     Recent Labs  Lab 11/24/21 1535 11/27/21 1142 11/27/21 1719  PROT 5.8* 5.6* 5.4*  ALBUMIN 2.6* 2.7* 2.6*  AST 38 40 42*  ALT 38 31 33  ALKPHOS 87 161* 146*  BILITOT 0.6 0.4 0.7   Lipids No results for input(s): CHOL, TRIG, HDL, LABVLDL, LDLCALC, CHOLHDL in the last 168 hours.  Hematology Recent Labs  Lab 11/24/21 1535 11/27/21 1142 11/27/21 1719  WBC 52.5* 67.0* 59.3*  RBC 3.95* 3.84* 3.87*  HGB 12.2* 11.9* 12.2*  HCT 35.1* 34.0* 34.6*  MCV 88.9 88.5 89.4  MCH 30.9 31.0 31.5  MCHC 34.8 35.0 35.3  RDW 14.5 14.7 14.6  PLT 34* 29* 28*   Thyroid  Recent Labs  Lab 11/27/21 1930  TSH 0.523     BNP Recent Labs  Lab 11/27/21 2014  BNP 472.9*    DDimer No results for input(s): DDIMER in the last 168 hours.   Radiology/Studies:  DG Chest 2 View  Result Date: 11/27/2021 CLINICAL DATA:  Suspected sepsis EXAM: CHEST - 2 VIEW COMPARISON:  10/27/2021 FINDINGS: Heart is normal size. Bilateral airspace disease, most pronounced in the right lower lobe and left upper lobe. Small right pleural effusion. No acute bony abnormality. IMPRESSION: Bilateral airspace disease with small right effusion. Findings are concerning for multifocal pneumonia. Electronically Signed   By: Rolm Baptise M.D.   On: 11/27/2021 18:14   MR BRAIN W WO CONTRAST  Result Date: 11/27/2021 CLINICAL DATA:  Brain metastasis. Brain/central nervous system neoplasm; assess for treatment response. EXAM: MRI HEAD WITHOUT AND WITH CONTRAST TECHNIQUE: Multiplanar, multiecho pulse sequences of the brain and surrounding structures were obtained without and with intravenous contrast. CONTRAST:  36mL GADAVIST GADOBUTROL 1 MMOL/ML IV SOLN COMPARISON:  MR head 10/15/2021. FINDINGS: Motion artifact is present. Brain: The largest, likely extra-axial lesion along the right falx has decreased in size measuring approximately 1.2 x 0.6 cm (previously 2.1 x 1.2 cm). Left parasagittal frontal lesion measures approximately 5 mm (series 17, image 102) is slightly smaller (previously 6 mm). The small left parietal lesion is not definitely seen. Extensive edema in the right frontal lobe has nearly resolved. Edema in the anterior left frontal lobe has resolved. No definite new mass or abnormal enhancement. There are numerous foci of diffusion hyperintensity the cerebral hemispheres involving all but the temporal lobes as well as the cerebellar hemispheres and thalami. No hydrocephalus or extra-axial collection. Vascular: Major vessel flow voids at the skull base are preserved. Skull and upper cervical spine: Normal marrow signal is preserved. Sinuses/Orbits:  Paranasal sinuses are aerated. Orbits are unremarkable. Other: Sella is unremarkable.  Mastoid air cells are clear. IMPRESSION: Motion degraded study. Positive treatment response as detailed above. No definite new metastasis. Numerous small foci of acute to subacute infarcts involving multiple vascular territories suggestive of a central embolic source. These results will be called to the ordering clinician or  representative by the Radiologist Assistant, and communication documented in the PACS or [employer]. Electronically Signed   By: Macy Mis M.D.   On: 11/27/2021 13:05     Assessment and Plan:  Mr Overfield is a 32 YOM hx metastatic lung cancer to the brain on chemotherapy presenting with confusion, stroke, b/l leg swelling, and dyspnea; no chest pain. New CVAs on MRI and extreme leukocytosis; also thrombocytopenic  STEMI: unclear whether this is a T1 or T2 process. Mr Demaria is clearly very sick beyond his cardiac problems with possible systemic infection. However the dyspnea and heart failure symptoms may be late presentations of a developing or late T1 STEMI. The patient is not appropriate for emergent LHC given severe thrombocytopenia without clear reversible cause because PCI would necessitate prolonged DAPT which could be deadly in this situation. I think initiating a heparin gtt and giving 325 aspirin followed by 81 daily is reasonable in this situation to medically manage him and monitor him for bleeding. This does put the patient at risk of hemorrhagic stroke given his metastatic tumors which I discussed with the patient and his wife. Will defer final decision to primary team and patient and his wife. If tolerating AC, can discuss possible LHC with primary team and oncology (would likely need to hold chemotherapy for at least a month to prevent recurrent thrombocytopenia). Would transufse platelets to 50k in case of procedure. Please also start high intensity statin. Hold on BB  given sinus tachycardia which we should not suppress HF, volume overload: recommend Iv diuresis given volume overload. Obtain TTE Possible endocarditis: When patient better optimized from HF standpoint if suspicion of endocarditis is high TEE is appropriate for further workup   Risk Assessment/Risk Scores:     TIMI Risk Score for ST  Elevation MI:   The patient's TIMI risk score is 5, which indicates a 12.4% risk of all cause mortality at 30 days.   New York Heart Association (NYHA) Functional Class NYHA Class III        For questions or updates, please contact [facility] Please consult [email or web address] for contact info under    Signed, [place] Jahniyah Revere, MD  11/27/2021 11:02 PM

## 2021-11-27 NOTE — ED Provider Notes (Signed)
[facility] EMERGENCY DEPARTMENT Provider Note   CSN: [id number] Arrival date & time: 11/27/21  1552     History No chief complaint on file.   Christopher Randall is a 60 y.o. male.  The history is provided by the patient and medical records.  Altered Mental Status Presenting symptoms: behavior changes and confusion   Severity:  Moderate Most recent episode:  More than 2 days ago Episode history:  Single Duration:  7 days Timing:  Constant Progression:  Unchanged Chronicity:  New Context: alcohol use   Context comment:  Recently diagnosed metastatic lung ca, started on chemo 1 week ago Associated symptoms: weakness   Associated symptoms: no abdominal pain, no fever, no palpitations, no rash, no seizures and no vomiting       Past Medical History:  Diagnosis Date   Abnormal TSH 09/28/2017   Anemia 11/27/2021   Anxiety 07/03/2012   Depression    Difficulty sleeping 09/28/2017   Examination of participant in clinical trial 09/11/2012   History of immunotherapy 02/23/2016   Melanoma of skin 10/26/2011   Nicotine dependence 07/17/2015   Smoker    STEMI (ST elevation myocardial infarction) ([place]) 11/27/2021    Patient Active Problem List   Diagnosis Date Noted   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   Hyponatremia 11/27/2021   Thrombocytopenia ([place]) 11/27/2021   Leukocytosis 11/27/2021   Anemia 11/27/2021   Acute respiratory failure ([place]) 11/27/2021   Multifocal pneumonia 11/27/2021   Sepsis ([place]) 11/27/2021   Cellulitis 11/27/2021   Confusion 11/27/2021   Olecranon bursitis, left elbow 11/27/2021   STEMI (ST elevation myocardial infarction) ([place]) 11/27/2021   Goals of care, counseling/discussion 11/17/2021   Lung cancer metastatic to brain [facility])    Mass of middle lobe of right lung    Brain metastases ([place]) 10/07/2021   Left-sided weakness    Brain metastasis ([place]) 10/06/2021   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   Insomnia 09/28/2017   Right elbow pain  09/28/2017   Left knee pain 09/28/2017   History of immunotherapy 02/23/2016   Examination of participant in clinical trial 09/11/2012   Anxiety, takes Ativan prn, mostly at night 07/03/2012   Malignant melanoma ([place]) 10/26/2011    Past Surgical History:  Procedure Laterality Date   BRONCHIAL BIOPSY  10/09/2021   Procedure: BRONCHIAL BIOPSIES;  Surgeon: [name], MD;  Location: [facility];  Service: Endoscopy;;   BRONCHIAL BRUSHINGS  10/09/2021   Procedure: BRONCHIAL BRUSHINGS;  Surgeon: [name], MD;  Location: [facility];  Service: Endoscopy;;   ENDOBRONCHIAL ULTRASOUND N/A 10/09/2021   Procedure: ENDOBRONCHIAL ULTRASOUND;  Surgeon: [name], MD;  Location: [facility];  Service: Endoscopy;  Laterality: N/A;   FINE NEEDLE ASPIRATION  10/09/2021   Procedure: FINE NEEDLE ASPIRATION (FNA) LINEAR;  Surgeon: [name], MD;  Location: [facility];  Service: Endoscopy;;   LEFT POPLITEAL MASS RESECTION  08/27/2011   pigmented villonodular synovitis, negative for malignancy   LYMPH GLAND EXCISION  07/12/2011   right axilla, consistent with melanoma   LYMPH NODE DISSECTION  08/27/2011   right, 0/17 nodes positive   VIDEO BRONCHOSCOPY N/A 10/09/2021   Procedure: VIDEO BRONCHOSCOPY WITHOUT FLUORO;  Surgeon: [name], MD;  Location: [facility];  Service: Endoscopy;  Laterality: N/A;       Family History  Problem Relation Age of Onset   Hypertension Mother    CAD Mother    Lung cancer Father     Social History   Tobacco  Use   Smoking status: Former    Packs/day: 0.25    Types: Cigarettes   Smokeless tobacco: Never  Vaping Use   Vaping Use: Never used  Substance Use Topics   Alcohol use: Yes    Alcohol/week: 6.0 standard drinks    Types: 6 Shots of liquor per week   Drug use: No    Home Medications Prior to Admission medications   Medication Sig Start Date End Date Taking? Authorizing Provider  dexamethasone (DECADRON) 1  MG tablet Take 1 tablet (1 mg total) by mouth daily with breakfast. 11/24/21  Yes Vaslow, Acey Lav, MD  Diclofenac Sodium 1.5 % SOLN Place one application on to the skin 2x daily. Patient taking differently: Apply 1 each topically daily as needed (cramps & aches). 01/15/19  Yes Briscoe Deutscher, DO  fluconazole (DIFLUCAN) 100 MG tablet Take 2 tablets today, then 1 tablet daily x 20 more days. Patient taking differently: Take 100 mg by mouth daily. 11/09/21  Yes Eppie Gibson, MD  folic acid (FOLVITE) 1 MG tablet Take 1 tablet (1 mg total) by mouth daily. Start 7 days before pemetrexed chemotherapy. Continue until 21 days after pemetrexed completed. 11/17/21  Yes Brunetta Genera, MD  furosemide (LASIX) 20 MG tablet Take 1 tablet (20 mg total) by mouth daily. 11/20/21  Yes Brunetta Genera, MD  magic mouthwash (lidocaine, diphenhydrAMINE, alum & mag hydroxide) suspension Swish and spit 5 mls by mouth every 4-6 hours as needed Patient taking differently: Swish and spit 5 mLs daily as needed for mouth pain. 11/20/21  Yes Brunetta Genera, MD  Multiple Vitamins-Minerals (ICAPS AREDS 2 PO) Take 1 capsule by mouth daily.   Yes [provider]  naphazoline-pheniramine (VISINE) 0.025-0.3 % ophthalmic solution 1 drop 4 (four) times daily as needed for eye irritation.   Yes [provider]  nicotine polacrilex (COMMIT) 4 MG lozenge Take 4 mg by mouth 2 (two) times daily as needed for smoking cessation.   Yes [provider]  omeprazole (PRILOSEC) 20 MG capsule Take daily while on steroids. Patient taking differently: 20 mg daily. 10/16/21  Yes Eppie Gibson, MD  ondansetron (ZOFRAN) 8 MG tablet Take 1 tablet (8 mg total) by mouth 2 (two) times daily as needed (Nausea or vomiting). Start if needed on the third day after cisplatin. Patient taking differently: Take 8 mg by mouth 2 (two) times daily as needed for nausea or vomiting. 11/17/21  Yes Brunetta Genera, MD   prochlorperazine (COMPAZINE) 10 MG tablet Take 1 tablet (10 mg total) by mouth every 6 (six) hours as needed (Nausea or vomiting). Patient taking differently: Take 10 mg by mouth every 6 (six) hours as needed for nausea or vomiting. 11/17/21  Yes Brunetta Genera, MD  sodium chloride (OCEAN) 0.65 % SOLN nasal spray Place 1 spray into both nostrils daily as needed for congestion.   Yes [provider]  vitamin B-12 (CYANOCOBALAMIN) 1000 MCG tablet Take 1,000 mcg by mouth daily.   Yes [provider]  lidocaine-prilocaine (EMLA) cream Apply to affected area once Patient not taking: Reported on 11/27/2021 11/17/21   Brunetta Genera, MD  oseltamivir (TAMIFLU) 75 MG capsule Take 1 capsule (75 mg total) by mouth 2 (two) times daily. Patient not taking: Reported on 11/27/2021 10/27/21   Allwardt, Randa Evens, PA-C    Allergies    Iopamidol, Iodinated diagnostic agents, and Iodine-131  Review of Systems   Review of Systems  Constitutional:  Positive for fatigue. Negative  for chills and fever.  HENT:  Negative for ear pain and sore throat.   Eyes:  Negative for pain and visual disturbance.  Respiratory:  Negative for cough and shortness of breath.   Cardiovascular:  Positive for leg swelling. Negative for chest pain and palpitations.  Gastrointestinal:  Negative for abdominal pain and vomiting.  Genitourinary:  Negative for dysuria and hematuria.  Musculoskeletal:  Negative for arthralgias and back pain.  Skin:  Positive for wound. Negative for color change and rash.  Neurological:  Positive for weakness. Negative for seizures and syncope.  Psychiatric/Behavioral:  Positive for confusion.   All other systems reviewed and are negative.  Physical Exam Updated Vital Signs BP (!) 136/114 (BP Location: Left Arm)   Pulse (!) 117   Temp 98.5 F (36.9 C) (Oral)   Resp 18   SpO2 92%   Physical Exam Vitals and nursing note reviewed.  Constitutional:      General: He is not  in acute distress.    Appearance: He is well-developed.     Comments: Somnolent appearing, awakens to voice  HENT:     Head: Normocephalic and atraumatic.     Right Ear: External ear normal.     Left Ear: External ear normal.     Nose: Nose normal. No congestion.     Mouth/Throat:     Mouth: Mucous membranes are moist.     Pharynx: Oropharynx is clear. No posterior oropharyngeal erythema.  Eyes:     General:        Right eye: No discharge.     Extraocular Movements: Extraocular movements intact.     Conjunctiva/sclera: Conjunctivae normal.     Pupils: Pupils are equal, round, and reactive to light.  Cardiovascular:     Rate and Rhythm: Normal rate and regular rhythm.     Pulses: Normal pulses.     Heart sounds: No murmur heard. Pulmonary:     Effort: Pulmonary effort is normal. No respiratory distress.     Breath sounds: Normal breath sounds. No wheezing, rhonchi or rales.  Abdominal:     General: Abdomen is flat. Bowel sounds are normal.     Palpations: Abdomen is soft.     Tenderness: There is no abdominal tenderness. There is no guarding or rebound.  Musculoskeletal:        General: No swelling, tenderness or deformity. Normal range of motion.     Cervical back: Normal range of motion and neck supple. No rigidity.  Skin:    General: Skin is warm and dry.     Capillary Refill: Capillary refill takes less than 2 seconds.     Findings: Rash (Scattered pettichae to lower extremities) present.  Neurological:     General: No focal deficit present.     Mental Status: He is alert and oriented to person, place, and time.     Cranial Nerves: No cranial nerve deficit.     Sensory: No sensory deficit.     Motor: Weakness (Symmetric, generalized) present.     Coordination: Coordination normal.     Comments: Somnolent appearing, awakens to voice.  Follows commands, but slow to respond.  Psychiatric:        Mood and Affect: Mood normal.    ED Results / Procedures / Treatments    Labs (all labs ordered are listed, but only abnormal results are displayed) Labs Reviewed  COMPREHENSIVE METABOLIC PANEL - Abnormal; Notable for the following components:      Result Value  Sodium 119 (*)    Chloride 81 (*)    Calcium 8.1 (*)    Total Protein 5.4 (*)    Albumin 2.6 (*)    AST 42 (*)    Alkaline Phosphatase 146 (*)    All other components within normal limits  CBC WITH DIFFERENTIAL/PLATELET - Abnormal; Notable for the following components:   WBC 59.3 (*)    RBC 3.87 (*)    Hemoglobin 12.2 (*)    HCT 34.6 (*)    Platelets 28 (*)    Neutro Abs 54.0 (*)    Monocytes Absolute 3.0 (*)    Eosinophils Absolute 0.6 (*)    Abs Immature Granulocytes 0.60 (*)    All other components within normal limits  PROTIME-INR - Abnormal; Notable for the following components:   Prothrombin Time 19.0 (*)    INR 1.6 (*)    All other components within normal limits  BASIC METABOLIC PANEL - Abnormal; Notable for the following components:   Sodium 120 (*)    Chloride 83 (*)    Calcium 7.9 (*)    All other components within normal limits  BRAIN NATRIURETIC PEPTIDE - Abnormal; Notable for the following components:   B Natriuretic Peptide 472.9 (*)    All other components within normal limits  C-REACTIVE PROTEIN - Abnormal; Notable for the following components:   CRP 9.0 (*)    All other components within normal limits  VITAMIN B12 - Abnormal; Notable for the following components:   Vitamin B-12 3,013 (*)    All other components within normal limits  TROPONIN I (HIGH SENSITIVITY) - Abnormal; Notable for the following components:   Troponin I (High Sensitivity) 2,168 (*)    All other components within normal limits  CULTURE, BLOOD (ROUTINE X 2)  CULTURE, BLOOD (ROUTINE X 2)  EXPECTORATED SPUTUM ASSESSMENT W GRAM STAIN, RFLX TO RESP C  RESPIRATORY PANEL BY PCR  MRSA NEXT GEN BY PCR, NASAL  RESP PANEL BY RT-PCR (FLU A&B, COVID) ARPGX2  LACTIC ACID, PLASMA  LACTIC ACID, PLASMA   TSH  PROCALCITONIN  URIC ACID  URINALYSIS, ROUTINE W REFLEX MICROSCOPIC  SODIUM, URINE, RANDOM  OSMOLALITY, URINE  LIPID PANEL  HEMOGLOBIN A1C  PROCALCITONIN  STREP PNEUMONIAE URINARY ANTIGEN  PROTIME-INR  COMPREHENSIVE METABOLIC PANEL  CBC  SEDIMENTATION RATE  AMMONIA  TROPONIN I (HIGH SENSITIVITY)    EKG None  Radiology DG Chest 2 View  Result Date: 11/27/2021 CLINICAL DATA:  Suspected sepsis EXAM: CHEST - 2 VIEW COMPARISON:  10/27/2021 FINDINGS: Heart is normal size. Bilateral airspace disease, most pronounced in the right lower lobe and left upper lobe. Small right pleural effusion. No acute bony abnormality. IMPRESSION: Bilateral airspace disease with small right effusion. Findings are concerning for multifocal pneumonia. Electronically Signed   By: Rolm Baptise M.D.   On: 11/27/2021 18:14   MR BRAIN W WO CONTRAST  Result Date: 11/27/2021 CLINICAL DATA:  Brain metastasis. Brain/central nervous system neoplasm; assess for treatment response. EXAM: MRI HEAD WITHOUT AND WITH CONTRAST TECHNIQUE: Multiplanar, multiecho pulse sequences of the brain and surrounding structures were obtained without and with intravenous contrast. CONTRAST:  37mL GADAVIST GADOBUTROL 1 MMOL/ML IV SOLN COMPARISON:  MR head 10/15/2021. FINDINGS: Motion artifact is present. Brain: The largest, likely extra-axial lesion along the right falx has decreased in size measuring approximately 1.2 x 0.6 cm (previously 2.1 x 1.2 cm). Left parasagittal frontal lesion measures approximately 5 mm (series 17, image 102) is slightly smaller (previously 6 mm). The small  left parietal lesion is not definitely seen. Extensive edema in the right frontal lobe has nearly resolved. Edema in the anterior left frontal lobe has resolved. No definite new mass or abnormal enhancement. There are numerous foci of diffusion hyperintensity the cerebral hemispheres involving all but the temporal lobes as well as the cerebellar hemispheres and  thalami. No hydrocephalus or extra-axial collection. Vascular: Major vessel flow voids at the skull base are preserved. Skull and upper cervical spine: Normal marrow signal is preserved. Sinuses/Orbits: Paranasal sinuses are aerated. Orbits are unremarkable. Other: Sella is unremarkable.  Mastoid air cells are clear. IMPRESSION: Motion degraded study. Positive treatment response as detailed above. No definite new metastasis. Numerous small foci of acute to subacute infarcts involving multiple vascular territories suggestive of a central embolic source. These results will be called to the ordering clinician or representative by the Radiologist Assistant, and communication documented in the PACS or [employer]. Electronically Signed   By: Macy Mis M.D.   On: 11/27/2021 13:05    Procedures Procedures   Medications Ordered in ED Medications  acetaminophen (TYLENOL) tablet 650 mg (has no administration in time range)    Or  acetaminophen (TYLENOL) suppository 650 mg (has no administration in time range)  HYDROcodone-acetaminophen (NORCO/VICODIN) 5-325 MG per tablet 1-2 tablet (has no administration in time range)  ondansetron (ZOFRAN) tablet 4 mg (has no administration in time range)    Or  ondansetron (ZOFRAN) injection 4 mg (has no administration in time range)  vancomycin (VANCOREADY) IVPB 2000 mg/400 mL (has no administration in time range)  dexamethasone (DECADRON) tablet 1 mg (has no administration in time range)  pantoprazole (PROTONIX) EC tablet 40 mg (has no administration in time range)  folic acid (FOLVITE) tablet 1 mg (has no administration in time range)  vitamin B-12 (CYANOCOBALAMIN) tablet 1,000 mcg (has no administration in time range)  polyvinyl alcohol (LIQUIFILM TEARS) 1.4 % ophthalmic solution 1 drop (has no administration in time range)  ceFEPIme (MAXIPIME) 2 g in sodium chloride 0.9 % 100 mL IVPB (has no administration in time range)  magic mouthwash w/lidocaine (has  no administration in time range)  clonazePAM (KLONOPIN) tablet 0.5 mg (has no administration in time range)   stroke: mapping our early stages of recovery book (has no administration in time range)  vancomycin (VANCOREADY) IVPB 1750 mg/350 mL (has no administration in time range)  Chlorhexidine Gluconate Cloth 2 % PADS 6 each (has no administration in time range)  furosemide (LASIX) injection 40 mg (40 mg Intravenous Given 11/27/21 2051)    ED Course  I have reviewed the triage vital signs and the nursing notes.  Pertinent labs & imaging results that were available during my care of the patient were reviewed by me and considered in my medical decision making (see chart for details).    MDM Rules/Calculators/A&P                          60 year old male with history of metastatic non-small cell lung cancer presenting with mental status changes and lower extremity pain and swelling.  He was seen by his oncologist earlier today, who is concerned for progressive leg swelling despite outpatient diuretics.  He was referred to the hospital for further management with IV diuretics, antibiotics for empiric cellulitis coverage, and ultrasound to rule out DVT.  Exam as above.  Patient with 3+ pitting edema and diffuse erythema with associated warmth.  He was given 40 of IV Lasix and  started on IV Ancef.   Regarding the patient's recent mental status changes, the wife notes increased confusion and behavior changes for the past week.  He had an MRI this morning that showed diffuse multifocal acute versus subacute infarcts.  There are concerns for central source of thromboembolism.  He has no focal deficits on exam.  He has no meningeal signs. He is nontoxic appearing. Low concern for meningitis.  Discussed with neurology team.  Will come evaluate patient at bedside.  No further work-up indicated at this time.  Suspect that source of thromboembolism related to cancer.  May benefit from echo in  house.  Discussed with medicine team.  Will admit for further evaluation and management.    After admission, patient did have an EKG that showed inferior ST elevations that were concerning for acute STEMI.  Patient rapidly reassessed.  He is not having any chest pain, pressure, shortness of breath.  He has no history of prior MI.  Repeat EKG obtained.  Similar to prior.  Discussed with cardiology team.  Recommend aspirin and start heparin GTT.  He is not appropriate for Cath Lab at this time with his severe thrombocytopenia and other comorbidities.  Family updated on plan.  Critical care team consulted.  Will transition care to ICU for further monitoring.  Final Clinical Impression(s) / ED Diagnoses Final diagnoses:  Leg Swelling Weakness    Rx / DC Orders ED Discharge Orders     None        Idamae Lusher, MD 11/27/21 2237    Margette Fast, MD 11/28/21 1336

## 2021-11-27 NOTE — ED Notes (Signed)
Pt reporting increased anxiety from being in the hospital. Pt noted to be restless and slightly agitated. MD made aware.

## 2021-11-27 NOTE — Progress Notes (Signed)
Pharmacy Antibiotic Note  Christopher Randall is a 60 y.o. male admitted on 11/27/2021 with  endocarditis/pna .  Pharmacy has been consulted for cefepime and vancomycin dosing.   WBC 59.3, SCr wnl, AF  Plan: -Start Cefepime 2 gm IV Q 8 hours -Vancomycin 2 gm IV load followed by Vancomycin 1750 mg IV Q 24 hrs. Goal AUC 400-550. Expected AUC: 484 SCr used: 0.82 -Monitor CBC, renal fx, cultures and clinical progress -Vanc levels as indicated      Temp (24hrs), Avg:98.2 F (36.8 C), Min:97.6 F (36.4 C), Max:98.5 F (36.9 C)  Recent Labs  Lab 11/24/21 1535 11/27/21 1142 11/27/21 1719  WBC 52.5* 67.0* 59.3*  CREATININE 0.82 0.82 0.89  LATICACIDVEN  --   --  1.9    Estimated Creatinine Clearance: 100.4 mL/min (by C-G formula based on SCr of 0.89 mg/dL).    Allergies  Allergen Reactions   Iopamidol Hives and Itching    Isovue break through with prep, Hives and Itching  Patient had PET?CT scan with 125 mls of Isovue-300. Noted to have two raised itchy hives which self-resolved. Patient had PET/CT scan with contrast on 05/27/2014 while on prednisone prep (3 doses of 50 mg-13 hour prep) and noted to have two hives which self -resolved without treatment   Iodinated Diagnostic Agents Rash   Iodine-131 Rash    Antimicrobials this admission: Cefepime 12/9 >>  Vancomycin 12/9 >>   Dose adjustments this admission:   Microbiology results: 12/9 BCx:    Thank you for allowing pharmacy to be a part of this patient's care.  Albertina Parr, PharmD., BCPS, BCCCP Clinical Pharmacist Please refer to [facility] for unit-specific pharmacist

## 2021-11-27 NOTE — Consult Note (Signed)
NAME:  Christopher Randall, MRN:  [id number], DOB:  Jul 10, 1961, LOS: 0 ADMISSION DATE:  11/27/2021, CONSULTATION DATE:  56 REFERRING MD:  Dr Rogers Blocker, CHIEF COMPLAINT: Lower extremity edema  History of Present Illness:  [name]  He presented with increasing shortness of breath and lower extremity edema with skin breakdown and associated erythema.  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  He had undergone 1 session of chemotherapy as well as radiation.  Over the past week the patient has had increasing confusion and word searching in addition to a ecchymotic rash that started on both ankles.  The swelling in his ankles is also gotten significantly worse over the last week.  He was seen in the oncology clinic today.  Ultimately he was referred to the emergency room for further evaluation of the legs.  Patient was also being worked up for the confusion.  MRI was obtained today.  This demonstrates a embolic CVA.  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  Discussed the case with Dr. Eliberto Ivory and Dr. Gaynelle Arabian.  Patient will be transferred from the medical floor to the intensive care unit for further work-up.  Patient continues to deny any chest pain/chest pressure.  Objective   Blood pressure (!) 136/114, pulse (!) 117, temperature 98.5 F (36.9  C), temperature source Oral, resp. rate 18, SpO2 92 %.       No intake or output data in the 24 hours ending 11/27/21 2052 There were no vitals filed for this visit.  Examination: General: No acute distress but is anxious HENT: Atraumatic normocephalic    Lungs: Clear to ausculation  No wheezing rales or rhonchi    Cardiovascular: Regular Rate.  No murmur rub or gallop Abdomen: Soft nontender/ non distended + BS No rebound,rigidity or guarding Extremities: Lower extrem +3 edema, bilateral medial and lateral skin breakdown.  Echymotic rash around the ankle. Distal pulses intact x4 feet have increased pallor. Neuro: Awake and alert.  The pt is CAOx3 but has extensive trouble with word finding    Assessment & Plan:  Lower extrem cellulitis Volume overload   Cardiac vs noncardiac cause Small Cell lung CA with mets to the brain Thrombocytopenia Hyponatremia (hypervolemic hyponatremia) Leukocytosis CVA-embolic STEMI  Plan: EKG that was completed after initial exam shows infero lateral wall  STEMI.  Patient was transferred to the intensive care unit for further work-up. Discussed the case with Dr. Gaynelle Arabian from cardiology.  Due to the platelets he is not a Candidate at this time.  We discussed the need for heparin and aspirin.  Discussion was had about risks and benefits.  We will plan on transfusing patient 2 units of platelets now and then starting heparin infusion and aspirin. Serial BMP to monitor sodium levels.  This appears to be hypervolemic hyponatremia. Will add Lipitor now. Closely monitor I's/O's. Add Lasix 20 mg IV every 8 hours x4 doses and reevaluate. Continue broad-spectrum antibiotics   Came back  to the bedside and discussed with the patient patient's wife and patient's son about the findings of the STEMI and options of care.  We had a long detailed discussion about the risks and benefits of initiating heparin and aspirin after transfusing platelets.  We discussed  the risks of bleeding could occur in the brain but currently the benefits would outweigh the risk.  They were all agreeable to the plan of care.   Best Practice (right click and "Reselect all SmartList Selections" daily)   Diet/type: Regular consistency (see orders) DVT prophylaxis: not indicated GI prophylaxis: PPI Lines: N/A Foley:  N/A Code Status:  full code Last date of multidisciplinary goals of care discussion []   Labs   CBC: Recent Labs  Lab 11/24/21 1535 11/27/21 1142 11/27/21 1719  WBC 52.5* 67.0* 59.3*  NEUTROABS 39.9* 53.2* 54.0*  HGB 12.2* 11.9* 12.2*  HCT 35.1* 34.0* 34.6*  MCV 88.9 88.5 89.4  PLT 34* 29* 28*    Basic Metabolic Panel: Recent Labs  Lab 11/24/21 1535 11/27/21 1142 11/27/21 1719  NA 127* 120* 119*  K 4.3 4.0 4.0  CL 89* 83* 81*  CO2 23 25 23   GLUCOSE 101* 93 93  BUN 20 18 17   CREATININE 0.82 0.82 0.89  CALCIUM 8.2* 8.0* 8.1*   GFR: Estimated Creatinine Clearance: 100.4 mL/min (by C-G formula based on SCr of 0.89 mg/dL). Recent Labs  Lab 11/24/21 1535 11/27/21 1142 11/27/21 1719  WBC 52.5* 67.0* 59.3*  LATICACIDVEN  --   --  1.9    Liver Function Tests: Recent Labs  Lab 11/24/21 1535 11/27/21 1142 11/27/21 1719  AST 38 40 42*  ALT 38 31 33  ALKPHOS 87 161* 146*  BILITOT 0.6 0.4 0.7  PROT 5.8* 5.6* 5.4*  ALBUMIN 2.6* 2.7* 2.6*   No results for input(s): LIPASE, AMYLASE in the last 168 hours. No results for input(s): AMMONIA in the last 168 hours.  ABG No results found for: PHART, PCO2ART, PO2ART, HCO3, TCO2, ACIDBASEDEF, O2SAT   Coagulation Profile: Recent Labs  Lab 11/27/21 1719  INR 1.6*    Cardiac Enzymes: No results for input(s): CKTOTAL, CKMB, CKMBINDEX, TROPONINI in the last 168 hours.  HbA1C: Hgb A1c MFr Bld  Date/Time Value Ref Range Status  10/07/2021 04:25 AM 5.5 4.8 - 5.6 % Final    Comment:    (NOTE) Pre diabetes:          5.7%-6.4%  Diabetes:              >6.4%  Glycemic control for    <7.0% adults with diabetes     CBG: No results for input(s): GLUCAP in the last 168 hours.  Review of Systems:   See HPI  Past Medical History:  He,  has a past medical history of Abnormal TSH (09/28/2017), Anemia (11/27/2021), Anxiety (07/03/2012), Depression, Difficulty sleeping (09/28/2017), Examination of participant in clinical trial (09/11/2012), History of immunotherapy (02/23/2016), Melanoma of skin (10/26/2011), Nicotine dependence (07/17/2015), and Smoker.   Surgical History:   Past Surgical History:  Procedure Laterality Date   BRONCHIAL BIOPSY  10/09/2021   Procedure: BRONCHIAL BIOPSIES;  Surgeon: [name], MD;  Location: [facility];  Service: Endoscopy;;   BRONCHIAL BRUSHINGS  10/09/2021   Procedure: BRONCHIAL BRUSHINGS;  Surgeon: [name], MD;  Location: [facility];  Service: Endoscopy;;   ENDOBRONCHIAL ULTRASOUND N/A 10/09/2021   Procedure: ENDOBRONCHIAL ULTRASOUND;  Surgeon: [name], MD;  Location: [facility];  Service: Endoscopy;  Laterality: N/A;  FINE NEEDLE ASPIRATION  10/09/2021   Procedure: FINE NEEDLE ASPIRATION (FNA) LINEAR;  Surgeon: [name], MD;  Location: [facility];  Service: Endoscopy;;   LEFT POPLITEAL MASS RESECTION  08/27/2011   pigmented villonodular synovitis, negative for malignancy   LYMPH GLAND EXCISION  07/12/2011   right axilla, consistent with melanoma   LYMPH NODE DISSECTION  08/27/2011   right, 0/17 nodes positive   VIDEO BRONCHOSCOPY N/A 10/09/2021   Procedure: VIDEO BRONCHOSCOPY WITHOUT FLUORO;  Surgeon: [name], MD;  Location: [facility];  Service: Endoscopy;  Laterality: N/A;     Social History:   reports that he has quit smoking. His smoking use included cigarettes. He smoked an average of .25 packs per day. He has never used smokeless tobacco. He reports current alcohol use of about 6.0 standard drinks per week. He reports that he does not use drugs.   Family History:   His family history includes CAD in his mother; Hypertension in his mother; Lung cancer in his father.   Allergies Allergies  Allergen Reactions   Iopamidol Hives and Itching    Isovue break through with prep, Hives and Itching  Patient had PET?CT scan with 125 mls of Isovue-300. Noted to have two raised itchy hives which self-resolved. Patient had PET/CT scan with contrast on 05/27/2014 while on prednisone prep (3 doses of 50 mg-13 hour prep) and noted to have two hives which self -resolved without treatment   Iodinated Diagnostic Agents Rash   Iodine-131 Rash     Home Medications  Prior to Admission medications   Medication Sig Start Date End Date Taking? Authorizing Provider  dexamethasone (DECADRON) 1 MG tablet Take 1 tablet (1 mg total) by mouth daily with breakfast. 11/24/21  Yes Vaslow, Acey Lav, MD  Diclofenac Sodium 1.5 % SOLN Place one application on to the skin 2x daily. Patient taking differently: Apply 1 each topically daily as needed (cramps & aches). 01/15/19  Yes Briscoe Deutscher, DO  fluconazole (DIFLUCAN) 100 MG tablet Take 2 tablets today, then 1 tablet daily x 20 more days. Patient taking differently: Take 100 mg by mouth daily. 11/09/21  Yes Eppie Gibson, MD  folic acid (FOLVITE) 1 MG tablet Take 1 tablet (1 mg total) by mouth daily. Start 7 days before pemetrexed chemotherapy. Continue until 21 days after pemetrexed completed. 11/17/21  Yes Brunetta Genera, MD  furosemide (LASIX) 20 MG tablet Take 1 tablet (20 mg total) by mouth daily. 11/20/21  Yes Brunetta Genera, MD  magic mouthwash (lidocaine, diphenhydrAMINE, alum & mag hydroxide) suspension Swish and spit 5 mls by mouth every 4-6 hours as needed Patient taking differently: Swish and spit 5 mLs daily as needed for mouth pain. 11/20/21  Yes Brunetta Genera, MD  Multiple Vitamins-Minerals (ICAPS AREDS 2 PO) Take 1 capsule by mouth daily.   Yes [provider]  naphazoline-pheniramine (VISINE)  0.025-0.3 % ophthalmic solution 1 drop 4 (four) times daily as needed for eye irritation.   Yes [provider]  nicotine polacrilex (COMMIT) 4 MG lozenge Take 4 mg by mouth 2 (two) times daily as needed for smoking cessation.   Yes [provider]  omeprazole (PRILOSEC) 20 MG capsule Take daily while on steroids. Patient taking differently: 20 mg daily. 10/16/21  Yes Eppie Gibson, MD  ondansetron (ZOFRAN) 8 MG tablet Take 1 tablet (8 mg total) by mouth 2 (two) times daily as needed (Nausea or vomiting). Start if needed on the third day after cisplatin. Patient taking  differently: Take 8 mg by mouth 2 (two) times daily as needed for nausea or vomiting. 11/17/21  Yes Brunetta Genera, MD  prochlorperazine (COMPAZINE) 10 MG tablet Take 1 tablet (10 mg total) by mouth every 6 (six) hours as needed (Nausea or vomiting). Patient taking differently: Take 10 mg by mouth every 6 (six) hours as needed for nausea or vomiting. 11/17/21  Yes Brunetta Genera, MD  sodium chloride (OCEAN) 0.65 % SOLN nasal spray Place 1 spray into both nostrils daily as needed for congestion.   Yes [provider]  vitamin B-12 (CYANOCOBALAMIN) 1000 MCG tablet Take 1,000 mcg by mouth daily.   Yes [provider]  lidocaine-prilocaine (EMLA) cream Apply to affected area once Patient not taking: Reported on 11/27/2021 11/17/21   Brunetta Genera, MD  oseltamivir (TAMIFLU) 75 MG capsule Take 1 capsule (75 mg total) by mouth 2 (two) times daily. Patient not taking: Reported on 11/27/2021 10/27/21   Allwardt, Randa Evens, PA-C     Critical care time: 65 mins

## 2021-11-27 NOTE — Consult Note (Signed)
NEUROLOGY CONSULTATION NOTE   Date of service: November 27, 2021 Patient Name: Christopher Randall MRN:  [id number] DOB:  1961-11-28 Reason for consult: "strokes on MRI Brain" Requesting Provider: Margette Fast, MD _ _ _   _ __   _ __ _ _  __ __   _ __   __ _  History of Present Illness  Christopher Randall is a 61 y.o. male with PMH significant for depression, lung cancer with known brain mets with recent first cycle of chemo + immunotherapy on 12/2 and SRT of brain lesions on 11/18 who was seen by his oncologist with BL leg swelling with skin breakdown and weeping wounds and hallucinations. He had MRI Brain with an without contrast today which demonstrated BL embolic appearing strokes.  He was sent to the ED by his oncology team. Reports fatigue since chemo was given along with poor attention, memory and hallucinations. This has worsened over the last 3 days.  He quit smoking 8 weeks ago, no hx of DM2, Htn, maybe HLD. No prior strokes.   mRS: 0 tNKAse/thrombectomy: not offered, no deficit and platelet count low. LKW: unclear, no obvious focal deficits. NIHSS components Score: Comment  1a Level of Conscious 0[x]  1[]  2[]  3[]      1b LOC Questions 0[x]  1[]  2[]       1c LOC Commands 0[x]  1[]  2[]       2 Best Gaze 0[x]  1[]  2[]       3 Visual 0[x]  1[]  2[]  3[]      4 Facial Palsy 0[x]  1[]  2[]  3[]      5a Motor Arm - left 0[x]  1[]  2[]  3[]  4[]  UN[]    5b Motor Arm - Right 0[x]  1[]  2[]  3[]  4[]  UN[]    6a Motor Leg - Left 0[x]  1[]  2[]  3[]  4[]  UN[]    6b Motor Leg - Right 0[x]  1[]  2[]  3[]  4[]  UN[]    7 Limb Ataxia 0[x]  1[]  2[]  3[]  UN[]     8 Sensory 0[x]  1[]  2[]  UN[]      9 Best Language 0[x]  1[]  2[]  3[]      10 Dysarthria 0[x]  1[]  2[]  UN[]      11 Extinct. and Inattention 0[x]  1[]  2[]       TOTAL: 0      ROS   Constitutional endorses weight loss, fever and chills.   HEENT Denies changes in vision and hearing.   Respiratory Denies SOB and cough.   CV Denies palpitations and CP   GI Denies abdominal  pain, nausea, vomiting and diarrhea.   GU Denies dysuria and urinary frequency.   MSK Denies myalgia and joint pain.   Skin Denies rash and pruritus.   Neurological Denies headache and syncope.   Psychiatric Denies recent changes in mood. Denies anxiety and depression.   Past History   Past Medical History:  Diagnosis Date   Abnormal TSH 09/28/2017   Anxiety 07/03/2012   Depression    Difficulty sleeping 09/28/2017   Examination of participant in clinical trial 09/11/2012   History of immunotherapy 02/23/2016   Melanoma of skin 10/26/2011   Nicotine dependence 07/17/2015   Smoker    Past Surgical History:  Procedure Laterality Date   BRONCHIAL BIOPSY  10/09/2021   Procedure: BRONCHIAL BIOPSIES;  Surgeon: [name], MD;  Location: [facility];  Service: Endoscopy;;   BRONCHIAL BRUSHINGS  10/09/2021   Procedure: BRONCHIAL BRUSHINGS;  Surgeon: [name], MD;  Location: [facility];  Service: Endoscopy;;   ENDOBRONCHIAL ULTRASOUND N/A 10/09/2021   Procedure: ENDOBRONCHIAL ULTRASOUND;  Surgeon: [name], MD;  Location: [facility];  Service: Endoscopy;  Laterality: N/A;   FINE NEEDLE ASPIRATION  10/09/2021   Procedure: FINE NEEDLE ASPIRATION (FNA) LINEAR;  Surgeon: [name], MD;  Location: [facility];  Service: Endoscopy;;   LEFT POPLITEAL MASS RESECTION  08/27/2011   pigmented villonodular synovitis, negative for malignancy   LYMPH GLAND EXCISION  07/12/2011   right axilla, consistent with melanoma   LYMPH NODE DISSECTION  08/27/2011   right, 0/17 nodes positive   VIDEO BRONCHOSCOPY N/A 10/09/2021   Procedure: VIDEO BRONCHOSCOPY WITHOUT FLUORO;  Surgeon: [name], MD;  Location: [facility];  Service: Endoscopy;  Laterality: N/A;   Family History  Problem Relation Age of Onset   Hypertension Mother    CAD Mother    Lung cancer Father    Social History   Socioeconomic History   Marital status: Married    Spouse name: Not  on file   Number of children: 3   Years of education: Not on file   Highest education level: Not on file  Occupational History    Employer: [employer]  Tobacco Use   Smoking status: Former    Packs/day: 0.25    Types: Cigarettes   Smokeless tobacco: Never  Vaping Use   Vaping Use: Never used  Substance and Sexual Activity   Alcohol use: Yes    Alcohol/week: 6.0 standard drinks    Types: 6 Shots of liquor per week   Drug use: No   Sexual activity: Not on file  Other Topics Concern   Not on file  Social History Narrative   Not on file   Social Determinants of Health   Financial Resource Strain: Not on file  Food Insecurity: No Food Insecurity   Worried About Running Out of Food in the Last Year: Never true   Ran Out of Food in the Last Year: Never true  Transportation Needs: No Transportation Needs   Lack of Transportation (Medical): No   Lack of Transportation (Non-Medical): No  Physical Activity: Not on file  Stress: Not on file  Social Connections: Not on file   Allergies  Allergen Reactions   Iopamidol Hives and Itching    Isovue break through with prep, Hives and Itching  Patient had PET?CT scan with 125 mls of Isovue-300. Noted to have two raised itchy hives which self-resolved. Patient had PET/CT scan with contrast on 05/27/2014 while on prednisone prep (3 doses of 50 mg-13 hour prep) and noted to have two hives which self -resolved without treatment   Iodinated Diagnostic Agents Rash   Iodine-131 Rash    Medications  (Not in a hospital admission)    Vitals   Vitals:   11/27/21 1600  BP: (!) 136/114  Pulse: (!) 117  Resp: 18  Temp: 97.6 F (36.4 C)  SpO2: 92%     There is no height or weight on file to calculate BMI.  Physical Exam   General: Laying comfortably in bed; in no acute distress.  HENT: Normal oropharynx and mucosa. Normal external appearance of ears and nose.  Neck: Supple, no pain or tenderness  CV: No JVD. No peripheral  edema.  Pulmonary: Symmetric Chest rise. Normal respiratory effort.  Abdomen: Soft to touch, non-tender.  Ext: No cyanosis, BL lower ext edema with weeping wounds, no deformity  Skin: No rash. Normal palpation of skin.   Musculoskeletal: Normal digits and nails by inspection. No clubbing.   Neurologic Examination  Mental status/Cognition: somnolent  but opens eye to voice, oriented to self, place, month and year, good attention. Able to do calculations, serial 7s and 3/3 recall at 3 mins. Speech/language: Fluent, comprehension intact, object naming intact, repetition intact.  Cranial nerves:   CN II Pupils equal and reactive to light, no VF deficits    CN III,IV,VI EOM intact, no gaze preference or deviation, no nystagmus    CN V normal sensation in V1, V2, and V3 segments bilaterally    CN VII no asymmetry, no nasolabial fold flattening    CN VIII normal hearing to speech    CN IX & X normal palatal elevation, no uvular deviation    CN XI 5/5 head turn and 5/5 shoulder shrug bilaterally    CN XII midline tongue protrusion    Motor:  Muscle bulk: normal, tone normal, pronator drift none tremor none Mvmt Root Nerve  Muscle Right Left Comments  SA C5/6 Ax Deltoid 5 5   EF C5/6 Mc Biceps 5 5   EE C6/7/8 Rad Triceps 5 5   WF C6/7 Med FCR     WE C7/8 PIN ECU     F Ab C8/T1 U ADM/FDI 5 5   HF L1/2/3 Fem Illopsoas 5 5   KE L2/3/4 Fem Quad 5 5   DF L4/5 D Peron Tib Ant   Did not assess due to weeping wounds at ankles  PF S1/2 Tibial Grc/Sol      Reflexes:  Right Left Comments  Pectoralis      Biceps (C5/6) 2 2   Brachioradialis (C5/6) 2 2    Triceps (C6/7) 2 2    Patellar (L3/4) 2 2    Achilles (S1)   Did not assess due to weeping wounds at ankles   Hoffman      Plantar     Jaw jerk    Sensation:  Light touch Intact throughout   Pin prick    Temperature    Vibration   Proprioception    Coordination/Complex Motor:  - Finger to Nose intact BL - Heel to shin unable to do  due to weeping wounds and pain in feet. - Rapid alternating movement are normal - Gait: unsafe to asess given wounds on feet and somnolence.  Labs   CBC:  Recent Labs  Lab 11/24/21 1535 11/27/21 1142  WBC 52.5* 67.0*  NEUTROABS 39.9* 53.2*  HGB 12.2* 11.9*  HCT 35.1* 34.0*  MCV 88.9 88.5  PLT 34* 29*    Basic Metabolic Panel:  Lab Results  Component Value Date   NA 120 (L) 11/27/2021   K 4.0 11/27/2021   CO2 25 11/27/2021   GLUCOSE 93 11/27/2021   BUN 18 11/27/2021   CREATININE 0.82 11/27/2021   CALCIUM 8.0 (L) 11/27/2021   GFRNONAA >60 11/27/2021   Lipid Panel:  Lab Results  Component Value Date   LDLCALC 105 (H) 01/15/2019   HgbA1c:  Lab Results  Component Value Date   HGBA1C 5.5 10/07/2021   Urine Drug Screen: No results found for: LABOPIA, COCAINSCRNUR, LABBENZ, AMPHETMU, THCU, LABBARB  Alcohol Level No results found for: [facility]  MR Angio head without contrast and Carotid Duplex BL: pending  MRI Brain(Personally reviewed): Motion degraded study. Mets have shrunk in size. No definite new metastasis. Numerous small foci of acute to subacute infarcts involving multiple vascular territories suggestive of a central embolic source.  Impression   Christopher Randall is a 60 y.o. male with PMH significant for depression, lung cancer with known brain  mets with recent first cycle of chemo + immunotherapy on 12/2 and SRT of brain lesions on 11/18 who was seen by his oncologist with BL leg swelling with skin breakdown and weeping wounds and hallucinations. He had MRI Brain with an without contrast today which demonstrated BL embolic appearing strokes, mets have shrunken in size. His neurologic examination is notable for somnolence with no focal deficits.  Etiology of stroke: likely hypercoagulability related to chemo, BL leg swelling with weeping wounds makes me think about potential DVTs and if he has a pfo, he could have embolic strokes. Could also be endocarditis given he  has had 6 weeks of fatigue and decline along with significant leukocytosis with WBC count of 59.3 up from 24.9 a month ago. He is at risk given his immunosuppressed state.  Recommendations  Plan:  - Frequent Neuro checks per stroke unit protocol - Recommend Vascular imaging with MRA Angio Head without contrast and US Carotid doppler - Recommend obtaining TTE with bubble study. - Recommend obtaining Lipid panel with LDL - Please start statin if LDL > 70 - Recommend HbA1c - Antithrombotic - hold off on Antiplatelet agent given low platelet count of 28. - Recommend DVT ppx - SBP goal - gradual normotension. - Recommend Telemetry monitoring for arrythmia - Recommend bedside swallow screen prior to PO intake. - Stroke education booklet - Recommend PT/OT/SLP consult - Recommend BL lower extremity venous dopplers. ______________________________________________________________________    Thank you for the opportunity to take part in the care of this patient. If you have any further questions, please contact the neurology consultation attending.  Signed,  [place] Pager Number [phone] _ _ _   _ __   _ __ _ _  __ __   _ __   __ _

## 2021-11-28 ENCOUNTER — Other Ambulatory Visit (HOSPITAL_COMMUNITY): Payer: BC Managed Care – PPO

## 2021-11-28 ENCOUNTER — Inpatient Hospital Stay (HOSPITAL_COMMUNITY): Payer: BC Managed Care – PPO

## 2021-11-28 ENCOUNTER — Encounter: Payer: Self-pay | Admitting: Hematology

## 2021-11-28 DIAGNOSIS — R9431 Abnormal electrocardiogram [ECG] [EKG]: Secondary | ICD-10-CM

## 2021-11-28 DIAGNOSIS — D72829 Elevated white blood cell count, unspecified: Secondary | ICD-10-CM | POA: Diagnosis not present

## 2021-11-28 DIAGNOSIS — I6340 Cerebral infarction due to embolism of unspecified cerebral artery: Secondary | ICD-10-CM

## 2021-11-28 DIAGNOSIS — I6389 Other cerebral infarction: Secondary | ICD-10-CM

## 2021-11-28 DIAGNOSIS — C7931 Secondary malignant neoplasm of brain: Secondary | ICD-10-CM | POA: Diagnosis not present

## 2021-11-28 DIAGNOSIS — D6859 Other primary thrombophilia: Secondary | ICD-10-CM

## 2021-11-28 DIAGNOSIS — C342 Malignant neoplasm of middle lobe, bronchus or lung: Secondary | ICD-10-CM | POA: Diagnosis not present

## 2021-11-28 DIAGNOSIS — L0390 Cellulitis, unspecified: Secondary | ICD-10-CM

## 2021-11-28 DIAGNOSIS — M7989 Other specified soft tissue disorders: Secondary | ICD-10-CM

## 2021-11-28 DIAGNOSIS — C3490 Malignant neoplasm of unspecified part of unspecified bronchus or lung: Secondary | ICD-10-CM | POA: Diagnosis not present

## 2021-11-28 DIAGNOSIS — E860 Dehydration: Secondary | ICD-10-CM | POA: Diagnosis not present

## 2021-11-28 DIAGNOSIS — R609 Edema, unspecified: Secondary | ICD-10-CM

## 2021-11-28 DIAGNOSIS — I639 Cerebral infarction, unspecified: Secondary | ICD-10-CM

## 2021-11-28 LAB — CBC
HCT: 32 % — ABNORMAL LOW (ref 39.0–52.0)
HCT: 28.7 % — ABNORMAL LOW (ref 39.0–52.0)
Hemoglobin: 11.3 g/dL — ABNORMAL LOW (ref 13.0–17.0)
Hemoglobin: 10.2 g/dL — ABNORMAL LOW (ref 13.0–17.0)
MCH: 31.1 pg (ref 26.0–34.0)
MCH: 31.5 pg (ref 26.0–34.0)
MCHC: 35.3 g/dL (ref 30.0–36.0)
MCHC: 35.5 g/dL (ref 30.0–36.0)
MCV: 88.2 fL (ref 80.0–100.0)
MCV: 88.6 fL (ref 80.0–100.0)
Platelets: 31 10*3/uL — ABNORMAL LOW (ref 150–400)
Platelets: 79 10*3/uL — ABNORMAL LOW (ref 150–400)
RBC: 3.63 MIL/uL — ABNORMAL LOW (ref 4.22–5.81)
RBC: 3.24 MIL/uL — ABNORMAL LOW (ref 4.22–5.81)
RDW: 14.6 % (ref 11.5–15.5)
RDW: 14.8 % (ref 11.5–15.5)
WBC: 55.3 10*3/uL (ref 4.0–10.5)
WBC: 39.7 10*3/uL — ABNORMAL HIGH (ref 4.0–10.5)
nRBC: 0.3 % — ABNORMAL HIGH (ref 0.0–0.2)
nRBC: 0.2 % (ref 0.0–0.2)

## 2021-11-28 LAB — TYPE AND SCREEN
ABO/RH(D): A POS
Antibody Screen: NEGATIVE

## 2021-11-28 LAB — CBC WITH DIFFERENTIAL/PLATELET
Abs Immature Granulocytes: 5.28 10*3/uL — ABNORMAL HIGH (ref 0.00–0.07)
Basophils Absolute: 0 10*3/uL (ref 0.0–0.1)
Basophils Relative: 0 %
Eosinophils Absolute: 0 10*3/uL (ref 0.0–0.5)
Eosinophils Relative: 0 %
HCT: 30.2 % — ABNORMAL LOW (ref 39.0–52.0)
Hemoglobin: 10.8 g/dL — ABNORMAL LOW (ref 13.0–17.0)
Immature Granulocytes: 12 %
Lymphocytes Relative: 4 %
Lymphs Abs: 1.8 10*3/uL (ref 0.7–4.0)
MCH: 31.6 pg (ref 26.0–34.0)
MCHC: 35.8 g/dL (ref 30.0–36.0)
MCV: 88.3 fL (ref 80.0–100.0)
Monocytes Absolute: 3.1 10*3/uL — ABNORMAL HIGH (ref 0.1–1.0)
Monocytes Relative: 7 %
Neutro Abs: 33.7 10*3/uL — ABNORMAL HIGH (ref 1.7–7.7)
Neutrophils Relative %: 77 %
Platelets: 87 10*3/uL — ABNORMAL LOW (ref 150–400)
RBC: 3.42 MIL/uL — ABNORMAL LOW (ref 4.22–5.81)
RDW: 14.7 % (ref 11.5–15.5)
WBC: 43.9 10*3/uL — ABNORMAL HIGH (ref 4.0–10.5)
nRBC: 0.2 % (ref 0.0–0.2)

## 2021-11-28 LAB — HEMOGLOBIN A1C
Hgb A1c MFr Bld: 5.8 % — ABNORMAL HIGH (ref 4.8–5.6)
Mean Plasma Glucose: 119.76 mg/dL

## 2021-11-28 LAB — PROTIME-INR
INR: 1.5 — ABNORMAL HIGH (ref 0.8–1.2)
Prothrombin Time: 18.1 seconds — ABNORMAL HIGH (ref 11.4–15.2)

## 2021-11-28 LAB — ECHOCARDIOGRAM LIMITED
Height: 69 in
Weight: 3259.28 oz

## 2021-11-28 LAB — TROPONIN I (HIGH SENSITIVITY)
Troponin I (High Sensitivity): 6947 ng/L (ref <18)
Troponin I (High Sensitivity): 6802 ng/L (ref <18)

## 2021-11-28 LAB — MRSA NEXT GEN BY PCR, NASAL: MRSA by PCR Next Gen: NOT DETECTED

## 2021-11-28 LAB — COMPREHENSIVE METABOLIC PANEL
ALT: 31 U/L (ref 0–44)
AST: 39 U/L (ref 15–41)
Albumin: 2.4 g/dL — ABNORMAL LOW (ref 3.5–5.0)
Alkaline Phosphatase: 133 U/L — ABNORMAL HIGH (ref 38–126)
Anion gap: 18 — ABNORMAL HIGH (ref 5–15)
BUN: 17 mg/dL (ref 6–20)
CO2: 21 mmol/L — ABNORMAL LOW (ref 22–32)
Calcium: 7.7 mg/dL — ABNORMAL LOW (ref 8.9–10.3)
Chloride: 82 mmol/L — ABNORMAL LOW (ref 98–111)
Creatinine, Ser: 1.02 mg/dL (ref 0.61–1.24)
GFR, Estimated: >60 mL/min (ref >60)
Glucose, Bld: 90 mg/dL (ref 70–99)
Potassium: 3.7 mmol/L (ref 3.5–5.1)
Sodium: 121 mmol/L — ABNORMAL LOW (ref 135–145)
Total Bilirubin: 1 mg/dL (ref 0.3–1.2)
Total Protein: 4.9 g/dL — ABNORMAL LOW (ref 6.5–8.1)

## 2021-11-28 LAB — PROCALCITONIN: Procalcitonin: 1.05 ng/mL

## 2021-11-28 LAB — ECHOCARDIOGRAM COMPLETE BUBBLE STUDY: S' Lateral: 2.6 cm

## 2021-11-28 LAB — LIPID PANEL
Cholesterol: 173 mg/dL (ref 0–200)
HDL: 20 mg/dL — ABNORMAL LOW (ref >40)
LDL Cholesterol: 119 mg/dL — ABNORMAL HIGH (ref 0–99)
Total CHOL/HDL Ratio: 8.7 RATIO
Triglycerides: 172 mg/dL — ABNORMAL HIGH (ref <150)
VLDL: 34 mg/dL (ref 0–40)

## 2021-11-28 IMAGING — MR MR MRA HEAD W/O CM
2 series · 19 of 48 positions shown · non-contrast
Comparison: MRI head [DATE]

CLINICAL DATA: Metastatic disease to brain.  Acute stroke.

EXAM:
MRA HEAD WITHOUT CONTRAST
TECHNIQUE: Angiographic images of the Circle of Willis were acquired using MRA
technique without intravenous contrast.

[Series 2: ax (id) · axial · 1.0mm · 0.43mm/px · z∈[-79,+3]mm · 18 of 176 slices shown]
[im 1/176]
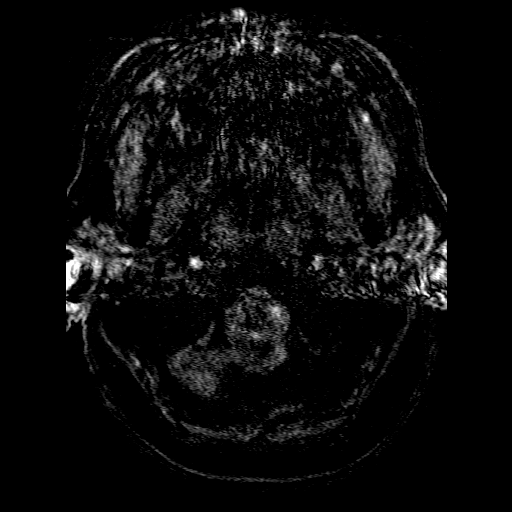
[im 4/176]
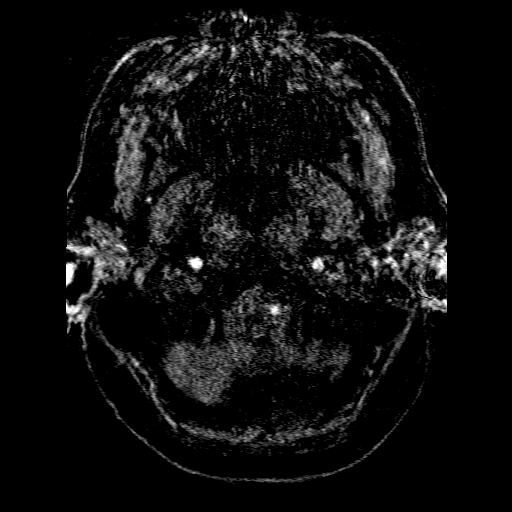
[im 8/176]
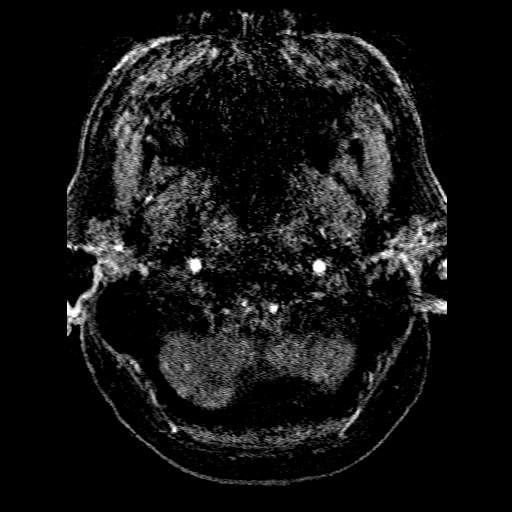
[im 12/176]
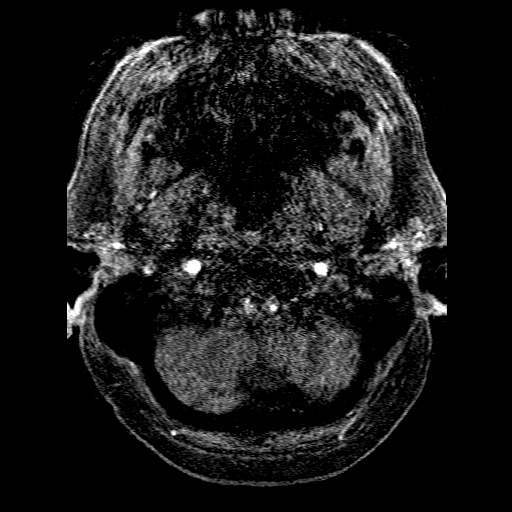
[im 16/176]
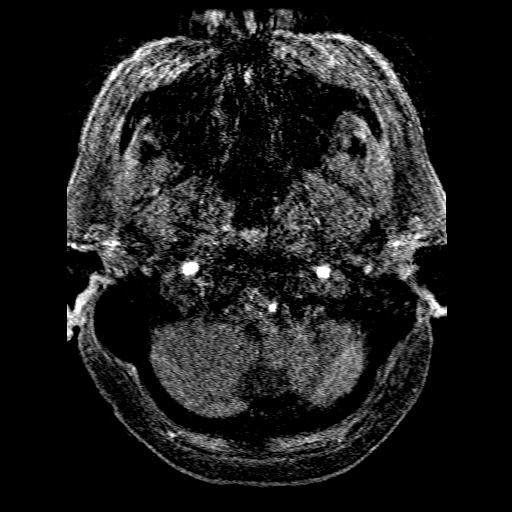
[im 20/176]
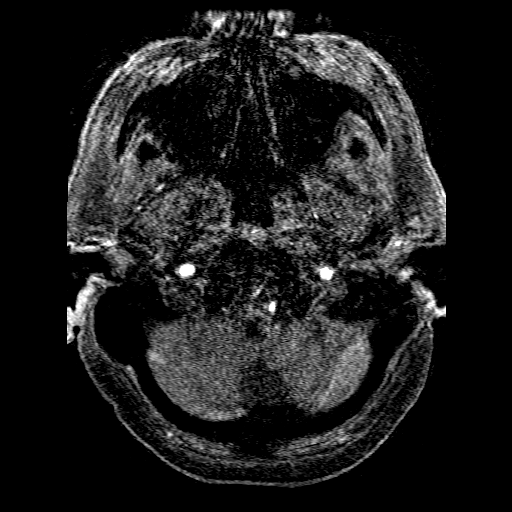
[im 23/176]
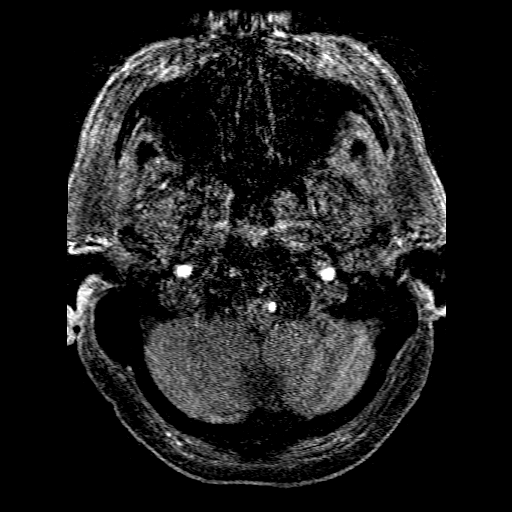
[im 27/176]
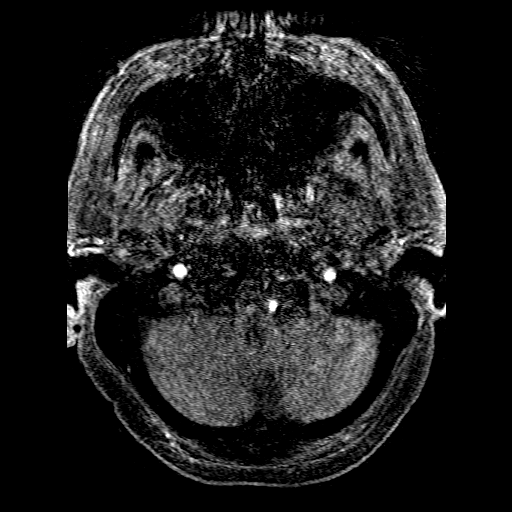
[im 31/176]
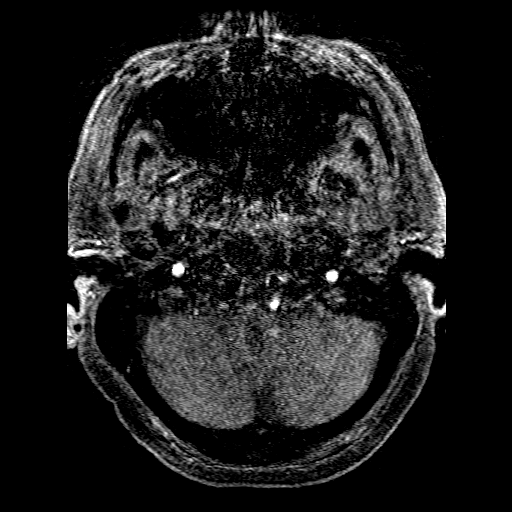
[im 35/176]
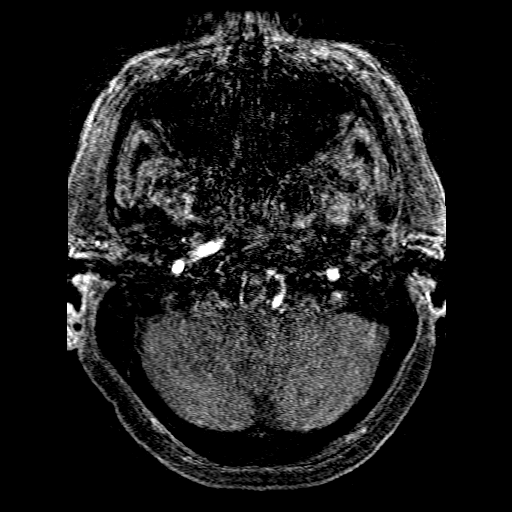
[im 54/176]
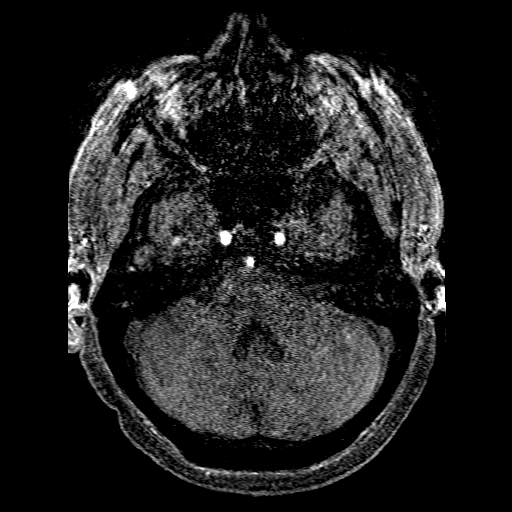
[im 77/176]
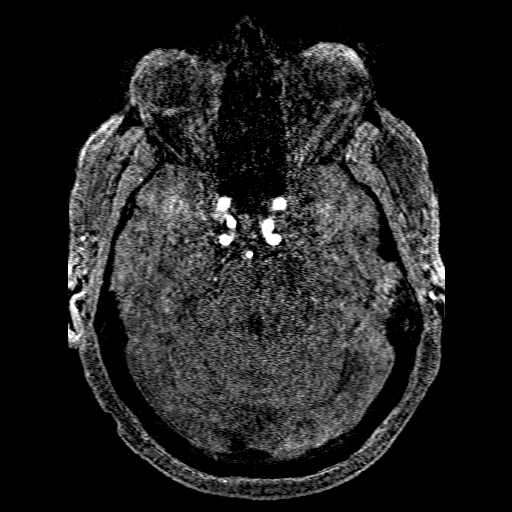
[im 88/176]
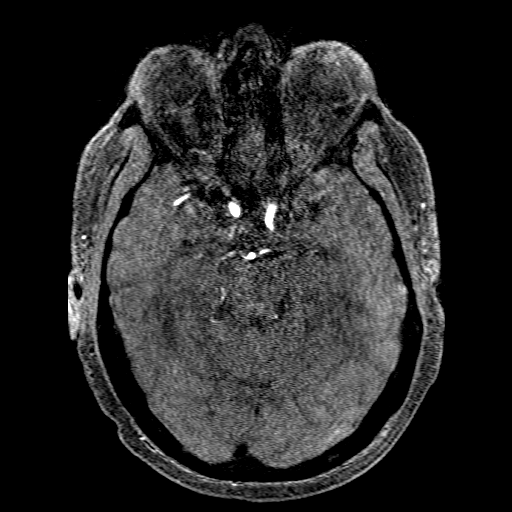
[im 99/176]
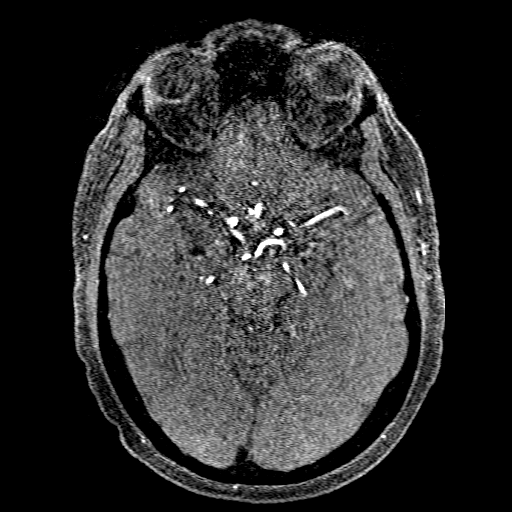
[im 122/176]
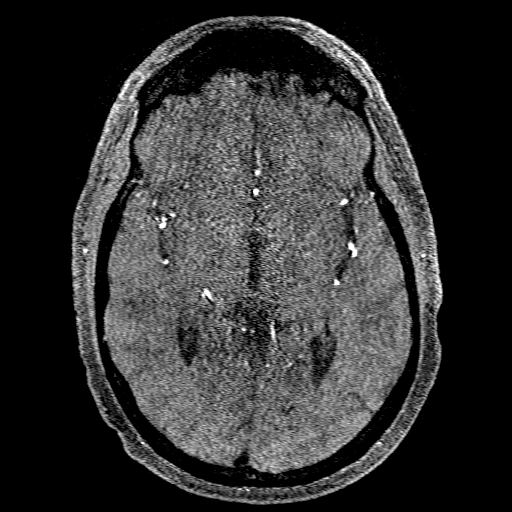
[im 145/176]
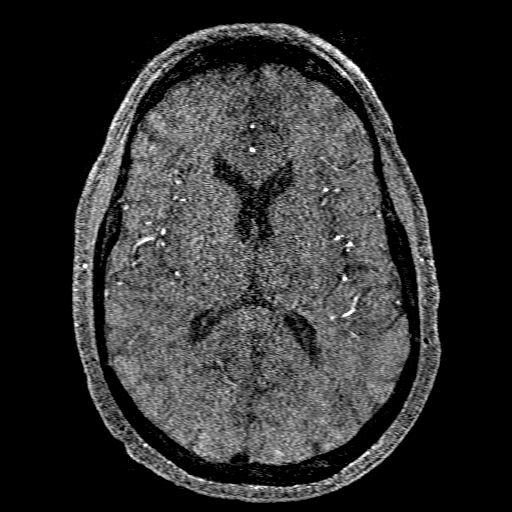
[im 149/176]
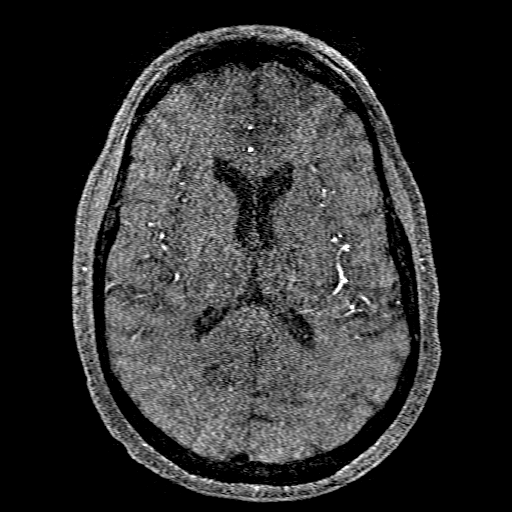
[im 168/176]
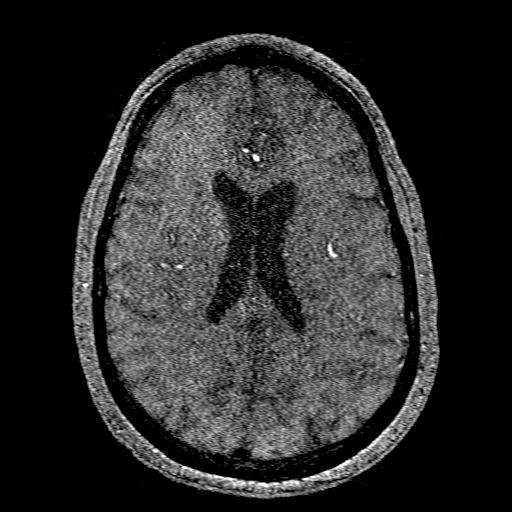

[Series 201: pjn:ax (id) · sagittal · 1.0mm · 0.43mm/px · 1 of 3 slices shown]
[im 1/3]
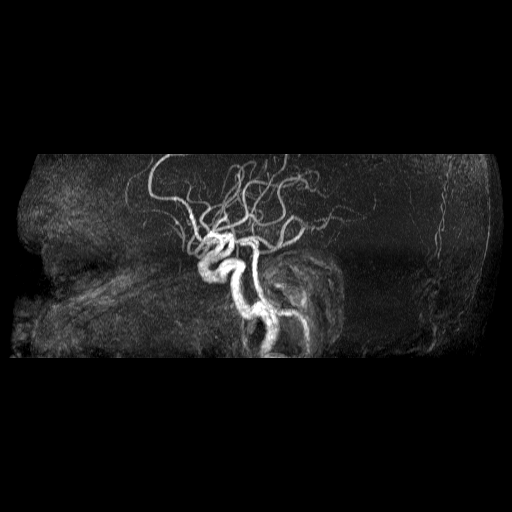

[19 of 48 positions shown; findings below may reference images not displayed]

FINDINGS: Anterior circulation: Internal carotid artery patent bilaterally.
Moderate stenosis right A2 segment. Left anterior cerebral artery
widely patent. Middle cerebral artery widely patent bilaterally.

Posterior circulation: Left vertebral artery is widely patent
supplies the basilar. Left PICA patent. Distal right vertebral
artery not visualized and may be occluded. This appears to be a
small vessel on the MRI. Basilar widely patent. Posterior cerebral
arteries patent without large vessel occlusion. Fetal origin right
posterior cerebral artery.

Anatomic variants: None

Other: Negative for cerebral aneurysm.
IMPRESSION: Moderate stenosis right anterior cerebral artery. Left anterior
cerebral artery widely patent. Both middle cerebral arteries patent

Loss of flow related signal distal right vertebral artery. This may
be occluded.

## 2021-11-28 IMAGING — CT CT ELBOW*L* W/CM
4 of 5 series · 15 of 33 positions shown, 17 images · IV contrast (omnipaque)
Comparison: None.

CLINICAL DATA: Soft tissue infection suspected, upper arm, xray
done

EXAM:
CT OF THE UPPER LEFT EXTREMITY WITH CONTRAST
TECHNIQUE: Multidetector CT imaging of the upper left extremity was performed
according to the standard protocol following intravenous contrast
administration.
CONTRAST:  150mL OMNIPAQUE IOHEXOL 300 MG/ML  SOLN

[Series 8: upper ext 1.5 st · axial · 0.50mm/px · z∈[-834,-689]mm · 5 of 147 slices shown, 7 images]
[im 25/147  soft-tissue]
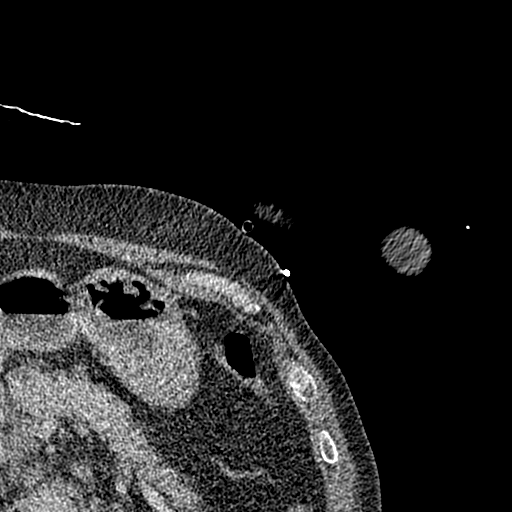
[im 25/147  bone]
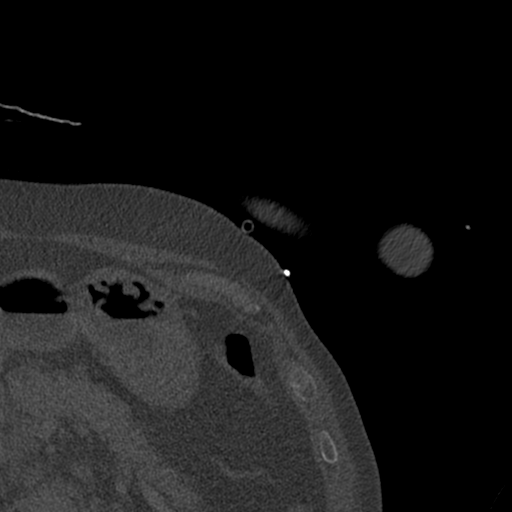
[im 49/147  bone]
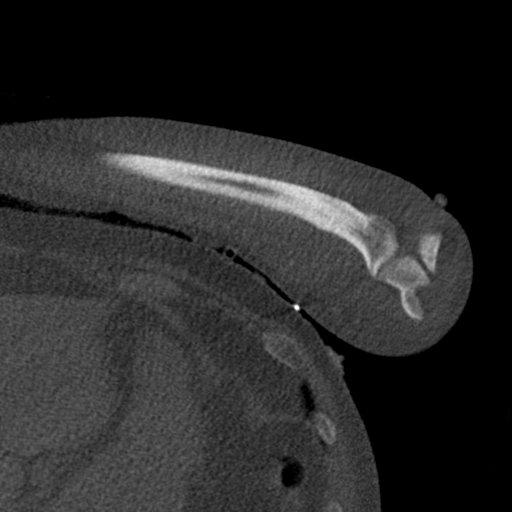
[im 74/147  bone]
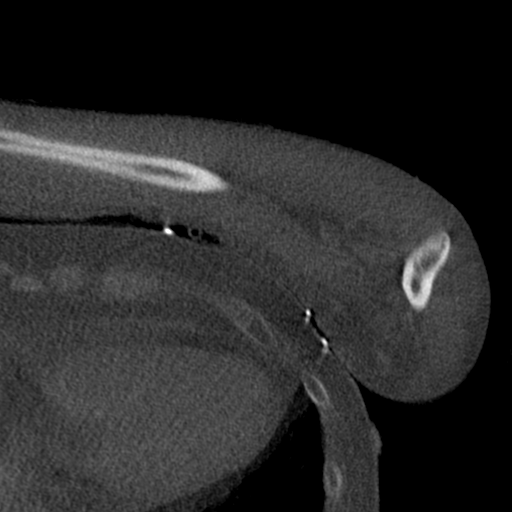
[im 98/147  bone]
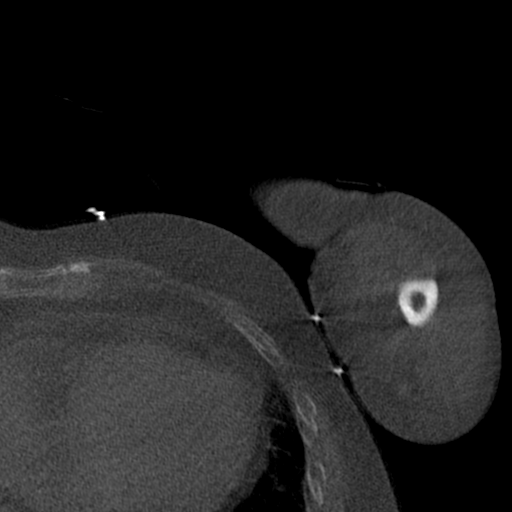
[im 122/147  soft-tissue]
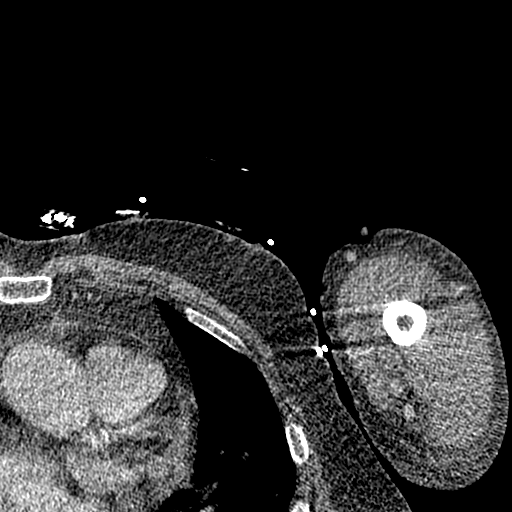
[im 122/147  bone]
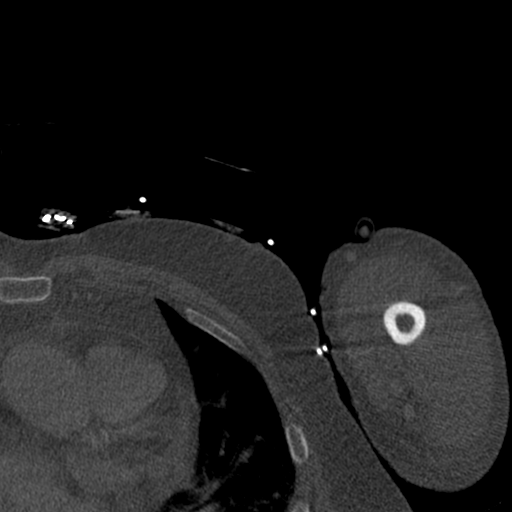

[Series 11: upper ext cor st · coronal · 0.44mm/px · 3 of 167 slices shown]
[im 34/167  bone]
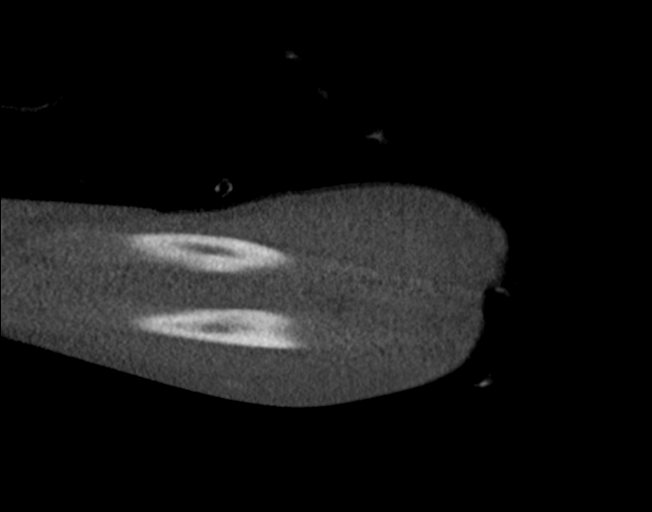
[im 67/167  bone]
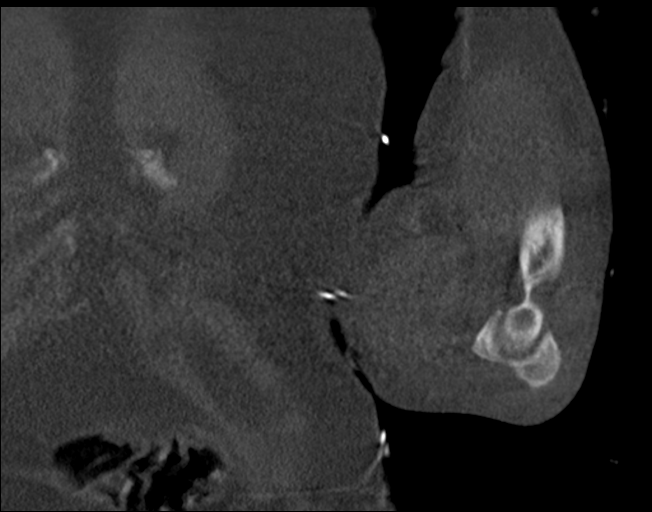
[im 100/167  bone]
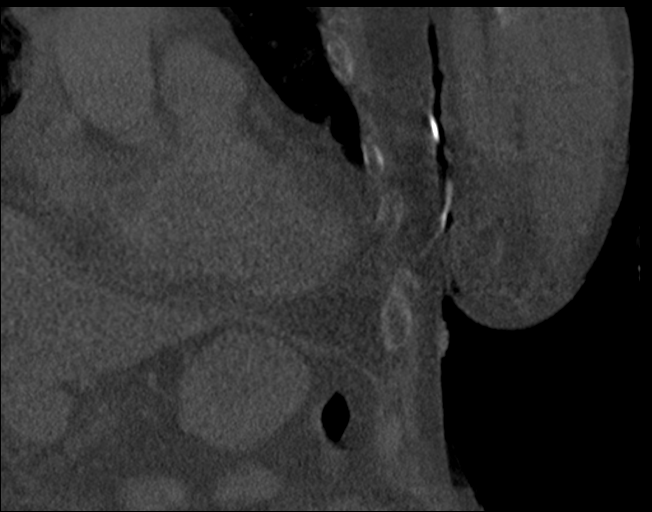

[Series 12: upper ext sag st · sagittal · 0.41mm/px · 5 of 171 slices shown]
[im 29/171  bone]
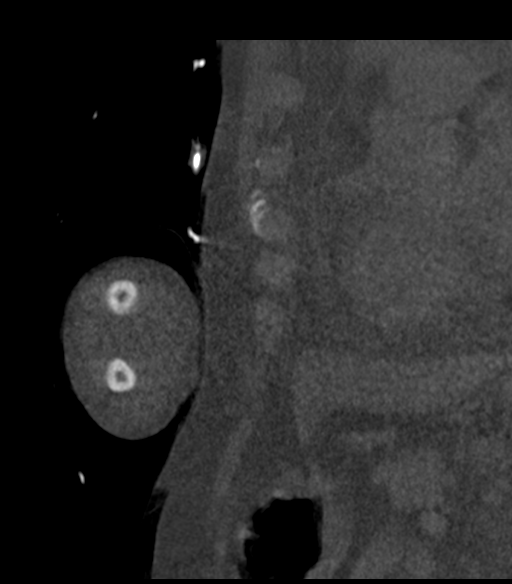
[im 57/171  bone]
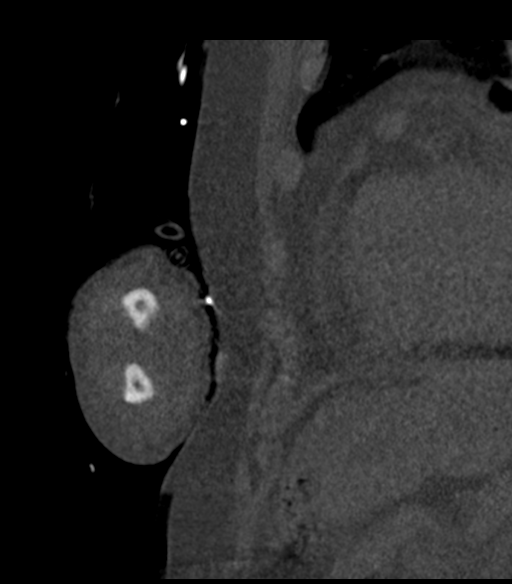
[im 86/171  bone]
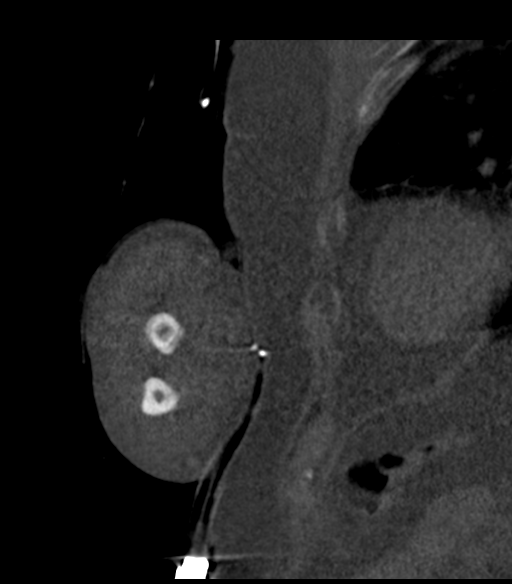
[im 114/171  bone]
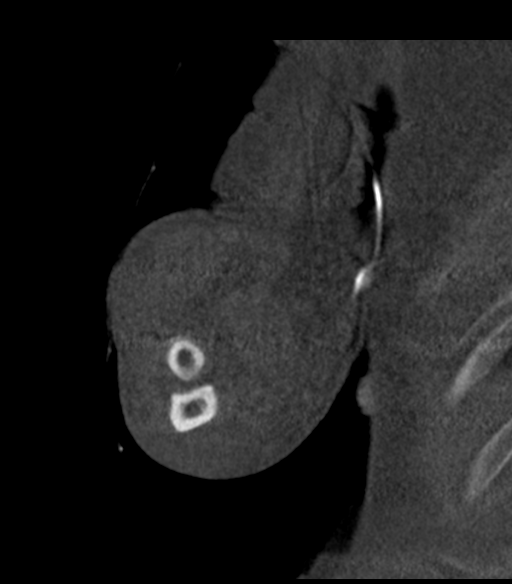
[im 142/171  bone]
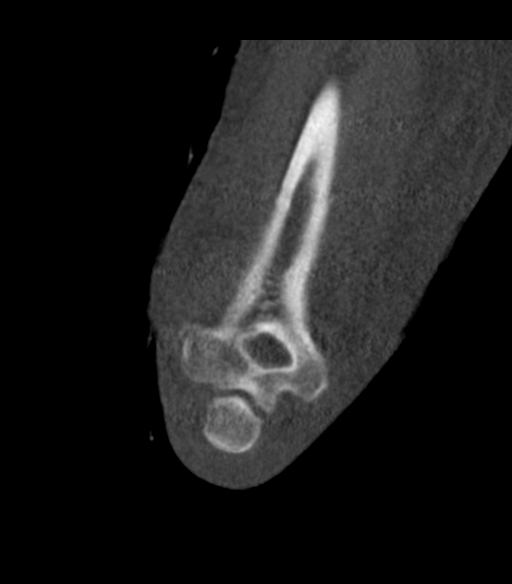

[Series 16: upper ext (id) bone · axial · 0.56mm/px · z∈[-832,-801]mm · 2 of 129 slices shown]
[im 22/129  bone]
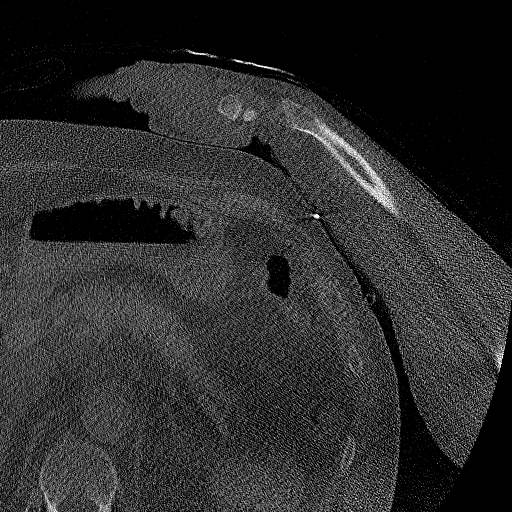
[im 43/129  bone]
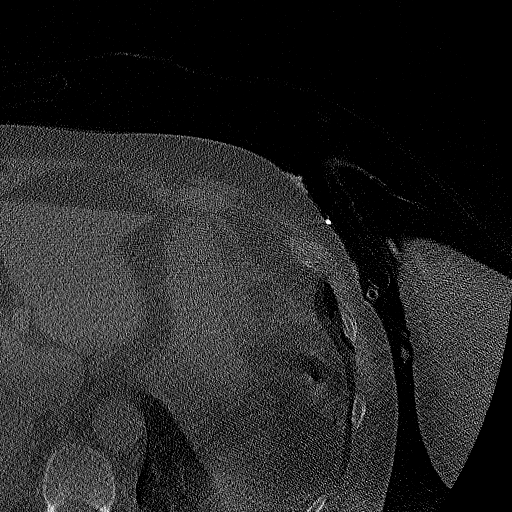

[15 of 33 positions shown; findings below may reference images not displayed]

FINDINGS: Technical note: Examination is limited by beam hardening artifact
related to patient's positioning of the elbow adjacent to the
abdominal wall. Elbow is in flexion resulting in nonstandard imaging
planes. There is also slight motion degradation affecting all
series.

Bones/Joint/Cartilage

No acute fracture. No dislocation. No significant elbow joint
effusion is seen. No bony destruction or periosteal elevation.

Ligaments

Suboptimally assessed by CT.

Muscles and Tendons

Musculotendinous structures are poorly evaluated. No obvious
intramuscular fluid collection.

Soft tissues

Fluid collection overlying the olecranon measuring approximately
x 1.2 x 3.0 cm with peripheral enhancement (series 8, image 119;
series 11, image [DATE] represent abscess or bursitis. There is
prominent soft tissue edema and ill-defined fluid at the elbow and
proximal to mid forearm. No soft tissue gas.
IMPRESSION: 1. Limited exam.
2. Fluid collection overlying the olecranon measuring approximately
3.3 x 1.2 x 3.0 cm with peripheral enhancement may represent abscess
or bursitis.
3. Prominent soft tissue edema and ill-defined fluid at the elbow
and proximal to mid forearm suggestive of cellulitis. No soft tissue
gas.
4. No acute osseous abnormality.
5. No appreciable elbow joint effusion to suggest septic arthritis.

## 2021-11-28 MED ORDER — HEPARIN (PORCINE) 25000 UT/250ML-% IV SOLN
900.0000 [IU]/h | INTRAVENOUS | Status: DC
  Administered 2021-11-28: 900 [IU]/h via INTRAVENOUS
  Filled 2021-11-28: qty 250

## 2021-11-28 MED ORDER — DIPHENHYDRAMINE HCL 50 MG/ML IJ SOLN: 25.0000 mg | Freq: Once | INTRAMUSCULAR | Status: DC

## 2021-11-28 MED ORDER — DIPHENHYDRAMINE HCL 50 MG/ML IJ SOLN
50.0000 mg | Freq: Once | INTRAMUSCULAR | Status: AC
  Administered 2021-11-28: 50 mg via INTRAVENOUS
  Filled 2021-11-28: qty 1

## 2021-11-28 MED ORDER — HYDROCORTISONE SOD SUC (PF) 250 MG IJ SOLR
200.0000 mg | Freq: Once | INTRAMUSCULAR | Status: AC
  Administered 2021-11-28: 200 mg via INTRAVENOUS
  Filled 2021-11-28: qty 200

## 2021-11-28 MED ORDER — ATORVASTATIN CALCIUM 80 MG PO TABS
80.0000 mg | ORAL_TABLET | Freq: Every day | ORAL | Status: DC
  Administered 2021-11-28: 80 mg via ORAL
  Filled 2021-11-28: qty 1

## 2021-11-28 MED ORDER — FLUCONAZOLE 100 MG PO TABS
100.0000 mg | ORAL_TABLET | Freq: Every day | ORAL | Status: DC
  Administered 2021-11-28 – 2021-12-04 (×7): 100 mg via ORAL
  Filled 2021-11-28 (×8): qty 1

## 2021-11-28 MED ORDER — VANCOMYCIN HCL 1500 MG/300ML IV SOLN
1500.0000 mg | INTRAVENOUS | Status: DC
  Administered 2021-11-29 – 2021-11-30 (×2): 1500 mg via INTRAVENOUS
  Filled 2021-11-28 (×2): qty 300

## 2021-11-28 MED ORDER — ATORVASTATIN CALCIUM 40 MG PO TABS
40.0000 mg | ORAL_TABLET | Freq: Every day | ORAL | Status: DC
  Administered 2021-11-29 – 2021-12-04 (×6): 40 mg via ORAL
  Filled 2021-11-28 (×7): qty 1

## 2021-11-28 MED ORDER — LORAZEPAM 1 MG PO TABS
2.0000 mg | ORAL_TABLET | Freq: Once | ORAL | Status: AC
  Administered 2021-11-28: 2 mg via ORAL
  Filled 2021-11-28: qty 2

## 2021-11-28 MED ORDER — DIPHENHYDRAMINE HCL 25 MG PO CAPS: 50.0000 mg | ORAL_CAPSULE | Freq: Once | ORAL | Status: AC

## 2021-11-28 MED ORDER — PERFLUTREN LIPID MICROSPHERE
1.0000 mL | INTRAVENOUS | Status: AC | PRN
  Administered 2021-11-28: 4.5 mL via INTRAVENOUS
  Filled 2021-11-28: qty 10

## 2021-11-28 MED ORDER — IOHEXOL 300 MG/ML  SOLN
150.0000 mL | Freq: Once | INTRAMUSCULAR | Status: AC | PRN
  Administered 2021-11-28: 150 mL via INTRAVENOUS

## 2021-11-28 NOTE — Evaluation (Signed)
Physical Therapy Evaluation Patient Details Name: Christopher Randall MRN: [id number] DOB: May 29, 1961 Today's Date: 11/28/2021  History of Present Illness  Pt is 60 yo male who presents with BLE swelling, confusion, and weakness. Pt with STEMI and MRI showed multiple embolic acute and subacute infarcts involving multiple vascular territories suggestive of central embolic source. Pt also with LE cellulitis.  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 Chemo x1 wk s/p radiosurgery of brain.  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 Pt also with B DVT. Other PMH: melanoma with brain mets and radiosurgery, anxiety/ depression.  Clinical Impression  Pt admitted with above diagnosis. Pt presents with mild confusion and restlessness as well as generalized weakness. Pt treated conservatively today due to medical complications. Pt stood foot of bed with RW and mod A +2. Prior to beginning of symptoms (last summer) pt was very active, mtn biking, walking 5 mi/day, and gym 5 days/wk. Recently retired 9/22. Generalized weakness noted today as well as pain BLE's with swelling, weeping, discoloration of ankles and feet. At this point recommending CIR consult for further therapy after the acute setting.  Pt currently with functional limitations due to the deficits listed below (see PT Problem List). Pt will benefit from skilled PT to increase their independence and safety with mobility to allow discharge to the venue listed below.          Recommendations for follow up therapy are one component of a multi-disciplinary discharge planning process, led by the attending physician.  Recommendations may be updated based on patient status, additional functional criteria and insurance authorization.  Follow Up Recommendations Acute inpatient rehab (3hours/day)    Assistance Recommended at Discharge Frequent or constant Supervision/Assistance  Functional Status Assessment Patient has had a  recent decline in their functional status and demonstrates the ability to make significant improvements in function in a reasonable and predictable amount of time.  Equipment Recommendations  Rolling walker (2 wheels)    Recommendations for Other Services Rehab consult;OT consult     Precautions / Restrictions Precautions Precautions: Fall Restrictions Weight Bearing Restrictions: No Other Position/Activity Restrictions: SBP <180, B DVT      Mobility  Bed Mobility Overal bed mobility: Modified Independent             General bed mobility comments: pt able to roll, bridge, and push self up in bed    Transfers Overall transfer level: Needs assistance Equipment used: Rolling walker (2 wheels);2 person hand held assist Transfers: Sit to/from Stand Sit to Stand: Mod assist;+2 physical assistance           General transfer comment: pt stood from foot of bed in egress position. First attempt with B HHA and pt unable to achieve full standing and surprised by how weak he felt. Second stand with RW and pt was able to come to full standing with mod A +2 and increased time    Ambulation/Gait               General Gait Details: did not progress mobility due to medical precautions today  Stairs            Wheelchair Mobility    Modified Rankin (Stroke Patients Only) Modified Rankin (Stroke Patients Only) Pre-Morbid Rankin Score: No symptoms Modified Rankin: Moderately severe disability     Balance Overall balance assessment: Needs assistance Sitting-balance support: Feet unsupported;No upper extremity supported Sitting balance-Leahy Scale: Good     Standing balance support: Bilateral upper extremity supported;Reliant on  assistive device for balance;During functional activity Standing balance-Leahy Scale: Poor Standing balance comment: required external assist to maintain standing                             Pertinent Vitals/Pain Pain  Assessment: Faces Faces Pain Scale: Hurts even more Pain Location: B feet and LE's Pain Descriptors / Indicators: Sore Pain Intervention(s): Limited activity within patient's tolerance;Monitored during session    Home Living Family/patient expects to be discharged to:: Private residence Living Arrangements: Spouse/significant other Available Help at Discharge: Family;Available 24 hours/day Type of Home: House Home Access: Level entry       Home Layout: Two level;Able to live on main level with bedroom/bathroom Home Equipment: Cane - quad;Shower seat Additional Comments: wife has taken FMLA to be with him. Children and other family live nearby as well    Prior Function Prior Level of Function : Independent/Modified Independent;Driving             Mobility Comments: independent until past couple of days. Just retired. Likes to mountain bike, walks 5 mi/ day, goes to gym 5 days/wk but is a smoker       Hand Dominance   Dominant Hand: Right    Extremity/Trunk Assessment   Upper Extremity Assessment Upper Extremity Assessment: Generalized weakness    Lower Extremity Assessment Lower Extremity Assessment: Generalized weakness;LLE deficits/detail LLE Deficits / Details: weeping, discoloration, swelling ankles and feet L>R LLE Sensation:  (hypersensitive) LLE Coordination: decreased gross motor    Cervical / Trunk Assessment Cervical / Trunk Assessment: Normal  Communication   Communication: Other (comment) (recent voice changes per wife)  Cognition Arousal/Alertness: Awake/alert Behavior During Therapy: Restless Overall Cognitive Status: Impaired/Different from baseline Area of Impairment: Attention;Memory;Following commands;Safety/judgement;Awareness;Problem solving                   Current Attention Level: Sustained Memory: Decreased short-term memory Following Commands: Follows one step commands inconsistently Safety/Judgement: Decreased awareness of  safety;Decreased awareness of deficits Awareness: Emergent Problem Solving: Difficulty sequencing;Requires verbal cues General Comments: pt with some confusion and is aware of these deficits and is frustrated/ anxious. See speech eval for further details        General Comments General comments (skin integrity, edema, etc.): HR sustained in 120-130's sinus tachy, SPO2 in 90's on RA, BP 116/73    Exercises     Assessment/Plan    PT Assessment Patient needs continued PT services  PT Problem List Decreased strength;Decreased activity tolerance;Decreased balance;Decreased mobility;Decreased cognition;Decreased knowledge of use of DME;Decreased safety awareness;Decreased knowledge of precautions;Pain;Decreased skin integrity;Cardiopulmonary status limiting activity       PT Treatment Interventions DME instruction;Gait training;Stair training;Functional mobility training;Therapeutic activities;Therapeutic exercise;Balance training;Neuromuscular re-education;Cognitive remediation;Patient/family education    PT Goals (Current goals can be found in the Care Plan section)  Acute Rehab PT Goals Patient Stated Goal: get home, get back to activity PT Goal Formulation: With patient/family Time For Goal Achievement: 12/12/21 Potential to Achieve Goals: Good    Frequency Min 4X/week   Barriers to discharge        Co-evaluation               AM-PAC PT "6 Clicks" Mobility  Outcome Measure Help needed turning from your back to your side while in a flat bed without using bedrails?: None Help needed moving from lying on your back to sitting on the side of a flat bed without using bedrails?: A Little Help needed  moving to and from a bed to a chair (including a wheelchair)?: Total Help needed standing up from a chair using your arms (e.g., wheelchair or bedside chair)?: Total Help needed to walk in hospital room?: Total Help needed climbing 3-5 steps with a railing? : Total 6 Click Score:  11    End of Session Equipment Utilized During Treatment: Oxygen Activity Tolerance: Patient tolerated treatment well Patient left: in bed;with call bell/phone within reach;with bed alarm set;with family/visitor present;with nursing/sitter in room Nurse Communication: Mobility status PT Visit Diagnosis: Unsteadiness on feet (R26.81);Muscle weakness (generalized) (M62.81);Pain;Difficulty in walking, not elsewhere classified (R26.2) Pain - part of body: Ankle and joints of foot    Time: 0930-1007 PT Time Calculation (min) (ACUTE ONLY): 37 min   Charges:   PT Evaluation $PT Eval Moderate Complexity: 1 Mod PT Treatments $Therapeutic Activity: 23-37 mins        Leighton Roach, [place]  Pager [phone] Office [place] 11/28/2021, 10:40 AM

## 2021-11-28 NOTE — Progress Notes (Addendum)
IV team consulted to place a second IV on a difficult IV stick. This RN requested IV team RN to please draw the type and screen ordered for patient. IV team RN drew type and screen. IV team RN asked this RN if she needed to send it down. This RN told IV team RN yes and thank you.This RN was informed by blood bank that type and screen needed to be redrawn. Another STAT type and screen ordered at 11/27/2021 at 2340. This RN called phlebotomy at 2351. This RN verified that the person I was speaking to was the phlebotomist for [facility]. This RN informed them patient had a type and screen that needed to be drawn. Phlebotomist acknowledged. This RN called phlebotomy again on 11/28/2021 at 0013 and again informed them that patient has an ordered STAT type and screen. This RN was told by phlebotomist "Alright I'm coming sir." Will continue to monitor.

## 2021-11-28 NOTE — Progress Notes (Signed)
Patient's son Christopher Randall) came to this nurse concerned that we did not have full background on the patient's current condition. Several things he noted:  Patient had a nosebleed "about a week ago that lasted 12-13 hours" and "spends most of his time in the garage drinking". "He falls asleep sitting upright in the chair out there" and "drinks a lot".   When I asked if he knew how many drinks per day, the son thought it was "aboutt 6-8 drinks of vodka".  If further information is needed, he left his number to be contacted at [phone]

## 2021-11-28 NOTE — Evaluation (Signed)
Clinical/Bedside Swallow Evaluation Patient Details  Name: Christopher Randall MRN: [id number] Date of Birth: 21-Dec-1960  Today's Date: 11/28/2021 Time: SLP Start Time (ACUTE ONLY): 1058 SLP Stop Time (ACUTE ONLY): 1108 SLP Time Calculation (min) (ACUTE ONLY): 10 min  Past Medical History:  Past Medical History:  Diagnosis Date   Abnormal TSH 09/28/2017   Anemia 11/27/2021   Anxiety 07/03/2012   Depression    Difficulty sleeping 09/28/2017   Examination of participant in clinical trial 09/11/2012   History of immunotherapy 02/23/2016   Melanoma of skin 10/26/2011   Nicotine dependence 07/17/2015   Smoker    STEMI (ST elevation myocardial infarction) ([place]) 11/27/2021   Past Surgical History:  Past Surgical History:  Procedure Laterality Date   BRONCHIAL BIOPSY  10/09/2021   Procedure: BRONCHIAL BIOPSIES;  Surgeon: [name], MD;  Location: [facility];  Service: Endoscopy;;   BRONCHIAL BRUSHINGS  10/09/2021   Procedure: BRONCHIAL BRUSHINGS;  Surgeon: [name], MD;  [facility];  Service: Endoscopy;;   ENDOBRONCHIAL ULTRASOUND N/A 10/09/2021   Procedure: ENDOBRONCHIAL ULTRASOUND;  Surgeon: [name], MD;  [facility];  Service: Endoscopy;  Laterality: N/A;   FINE NEEDLE ASPIRATION  10/09/2021   Procedure: FINE NEEDLE ASPIRATION (FNA) LINEAR;  Surgeon: [name], MD;  [facility];  Service: Endoscopy;;   LEFT POPLITEAL MASS RESECTION  08/27/2011   pigmented villonodular synovitis, negative for malignancy   LYMPH GLAND EXCISION  07/12/2011   right axilla, consistent with melanoma   LYMPH NODE DISSECTION  08/27/2011   right, 0/17 nodes positive   VIDEO BRONCHOSCOPY N/A 10/09/2021   Procedure: VIDEO BRONCHOSCOPY WITHOUT FLUORO;  Surgeon: [name], MD;  [facility];  Service: Endoscopy;  Laterality: N/A;   HPI:  60 y.o. male with PMH significant for depression, lung cancer with known brain mets  with recent first cycle of chemo + immunotherapy on 12/2 and SRT of brain lesions on 11/18 who was seen by his oncologist with BL leg swelling with skin breakdown and weeping wounds and hallucinations. He had MRI Brain with an without contrast which demonstrated BL embolic appearing strokes. Per notes, he reports poor attention, memory, hallucinations.  Some difficulty swallowing meds, so bedside swallow eval was ordered this am.   Assessment / Plan / Recommendation  Clinical Impression  Pt presents with functional oropharyngeal swallowing- confusion and sleepiness created some risk for spills and dropping materials, but mastication is thorough and WNL. Swallow response appears to be timely.  There are no s/sx of aspiration.  No dysphagia identified.  There were some reported difficulties swallowing meds whole. Recommend giving with puree/pudding; f/u with thin liquids to help wash through esophagus in case there is some pill hesitation there.  Continue regular solids, thin liquids. No SLP f/u for swallowing/ D/W RN. SLP Visit Diagnosis: Dysphagia, unspecified (R13.10)    Aspiration Risk  No limitations    Diet Recommendation   Continue regular solids, thin liquids  Medication Administration: Whole meds with puree    Other  Recommendations Oral Care Recommendations: Oral care BID    Recommendations for follow up therapy are one component of a multi-disciplinary discharge planning process, led by the attending physician.  Recommendations may be updated based on patient status, additional functional criteria and insurance authorization.  Follow up Recommendations No SLP follow up      Assistance Recommended at Discharge Frequent or constant Supervision/Assistance  Functional Status Assessment    Frequency and Duration min 2x/week  Prognosis        Swallow Study   General HPI: 60 y.o. male with PMH significant for depression, lung cancer with known brain mets with recent first  cycle of chemo + immunotherapy on 12/2 and SRT of brain lesions on 11/18 who was seen by his oncologist with BL leg swelling with skin breakdown and weeping wounds and hallucinations. He had MRI Brain with an without contrast which demonstrated BL embolic appearing strokes. Per notes, he reports poor attention, memory, hallucinations. Type of Study: Bedside Swallow Evaluation Diet Prior to this Study: Regular Temperature Spikes Noted: No Respiratory Status: Nasal cannula History of Recent Intubation: No Behavior/Cognition: Alert;Cooperative;Confused Oral Cavity Assessment: Within Functional Limits Oral Care Completed by SLP: No Oral Cavity - Dentition: Adequate natural dentition Vision: Functional for self-feeding Self-Feeding Abilities: Able to feed self Patient Positioning: Upright in bed Baseline Vocal Quality: Normal Volitional Cough: Strong    Oral/Motor/Sensory Function Overall Oral Motor/Sensory Function: Within functional limits   Ice Chips Ice chips: Within functional limits   Thin Liquid Thin Liquid: Within functional limits    Nectar Thick Nectar Thick Liquid: Not tested   Honey Thick Honey Thick Liquid: Not tested   Puree Puree: Within functional limits   Solid     Solid: Within functional limits      Christopher Randall 11/28/2021,11:13 AM  [name], [place] Office number [phone] Pager [phone]

## 2021-11-28 NOTE — Evaluation (Signed)
Speech Language Pathology Evaluation Patient Details Name: Christopher Randall MRN: [id number] DOB: 1961/08/04 Today's Date: 11/28/2021 Time: 0347-4259 SLP Time Calculation (min) (ACUTE ONLY): 17 min  Problem List:  Patient Active Problem List   Diagnosis Date Noted   Embolic stroke ([name]) 56/38/7564   Hyponatremia 11/27/2021   Thrombocytopenia ([name]) 11/27/2021   Leukocytosis 11/27/2021   Anemia 11/27/2021   Acute respiratory failure ([name]) 11/27/2021   Multifocal pneumonia 11/27/2021   Sepsis ([name]) 11/27/2021   Cellulitis 11/27/2021   Confusion 11/27/2021   Olecranon bursitis, left elbow 11/27/2021   STEMI (ST elevation myocardial infarction) ([place]) 11/27/2021   Goals of care, counseling/discussion 11/17/2021   Lung cancer metastatic to brain [facility])    Mass of middle lobe of right lung    Brain metastases ([name]) 10/07/2021   Left-sided weakness    Brain metastasis ([name]) 10/06/2021   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   Insomnia 09/28/2017   Right elbow pain 09/28/2017   Left knee pain 09/28/2017   History of immunotherapy 02/23/2016   Examination of participant in clinical trial 09/11/2012   Anxiety, takes Ativan prn, mostly at night 07/03/2012   Malignant melanoma ([name]) 10/26/2011   Past Medical History:  Past Medical History:  Diagnosis Date   Abnormal TSH 09/28/2017   Anemia 11/27/2021   Anxiety 07/03/2012   Depression    Difficulty sleeping 09/28/2017   Examination of participant in clinical trial 09/11/2012   History of immunotherapy 02/23/2016   Melanoma of skin 10/26/2011   Nicotine dependence 07/17/2015   Smoker    STEMI (ST elevation myocardial infarction) ([name]) 11/27/2021   Past Surgical History:  Past Surgical History:  Procedure Laterality Date   BRONCHIAL BIOPSY  10/09/2021   Procedure: BRONCHIAL BIOPSIES;  Surgeon: [name], MD;  [facility];  Service: Endoscopy;;   BRONCHIAL BRUSHINGS  10/09/2021   Procedure: BRONCHIAL BRUSHINGS;   Surgeon: [name], MD;  [facility];  Service: Endoscopy;;   ENDOBRONCHIAL ULTRASOUND N/A 10/09/2021   Procedure: ENDOBRONCHIAL ULTRASOUND;  Surgeon: [name], MD;  [facility];  Service: Endoscopy;  Laterality: N/A;   FINE NEEDLE ASPIRATION  10/09/2021   Procedure: FINE NEEDLE ASPIRATION (FNA) LINEAR;  Surgeon: [name], MD;  [facility];  Service: Endoscopy;;   LEFT POPLITEAL MASS RESECTION  08/27/2011   pigmented villonodular synovitis, negative for malignancy   LYMPH GLAND EXCISION  07/12/2011   right axilla, consistent with melanoma   LYMPH NODE DISSECTION  08/27/2011   right, 0/17 nodes positive   VIDEO BRONCHOSCOPY N/A 10/09/2021   Procedure: VIDEO BRONCHOSCOPY WITHOUT FLUORO;  Surgeon: [name], MD;  [facility];  Service: Endoscopy;  Laterality: N/A;   HPI:  60 y.o. male with PMH significant for depression, lung cancer with known brain mets with recent first cycle of chemo + immunotherapy on 12/2 and SRT of brain lesions on 11/18 who was seen by his oncologist with BL leg swelling with skin breakdown and weeping wounds and hallucinations. He had MRI Brain with an without contrast which demonstrated BL embolic appearing strokes. Per notes, he reports poor attention, memory, hallucinations.   Assessment / Plan / Recommendation Clinical Impression  Pt presents with cognitive-linguistic changes characterized by intermittent anomia during conversation, poor attention (inhibiting storage of new information), difficulty following multistep commands, impaired memory.  He scored a 15/30 per the [place] Status Exam.  Impairments in attention are really primary, impacting understanding, recall, direction-following.  [name] recognizes deficits,  works hard to Office manager (e.g, clock drawing - significant difficulty with number placement and time markers - persisted with attempts to correct errors). Recommend  SLP f/u while in acute care for education, compensatory strategies.    SLP Assessment  SLP Recommendation/Assessment: Patient needs continued Speech [place] [facility] SLP Visit Diagnosis: Cognitive communication deficit (R41.841)    Recommendations for follow up therapy are one component of a multi-disciplinary discharge planning process, led by the attending physician.  Recommendations may be updated based on patient status, additional functional criteria and insurance authorization.    Follow Up Recommendations  Other (comment) (tba)    Assistance Recommended at Discharge  Frequent or constant Supervision/Assistance  Functional Status Assessment    Frequency and Duration min 2x/week  2 weeks      SLP Evaluation Cognition  Overall Cognitive Status: Impaired/Different from baseline Arousal/Alertness: Awake/alert Orientation Level: Oriented to place;Oriented to person;Disoriented to time Attention: Sustained Sustained Attention: Impaired Sustained Attention Impairment: Verbal basic Memory: Impaired Memory Impairment: Storage deficit Awareness: Appears intact Executive Function: Decision Making Decision Making: Impaired       Comprehension  Auditory Comprehension Overall Auditory Comprehension: Impaired Yes/No Questions: Within Functional Limits Commands: Impaired Interfering Components: Attention EffectiveTechniques: Extra processing time;Repetition Visual Recognition/Discrimination Discrimination: Within Function Limits    Expression Expression Primary Mode of Expression: Verbal Verbal Expression Initiation: No impairment Level of Generative/Spontaneous Verbalization: Conversation Repetition: No impairment Naming: Impairment Written Expression Dominant Hand: Right   Oral / Motor  Oral Motor/Sensory Function Overall Oral Motor/Sensory Function: Within functional limits Motor Speech Overall Motor Speech: Appears within functional limits for tasks  assessed   GO                   Rielly Corlett L. Tivis Ringer, [place] Office number [phone] Pager [phone]  Assunta Curtis 11/28/2021, 8:37 AM

## 2021-11-28 NOTE — Progress Notes (Signed)
Pharmacy Antibiotic Note  Christopher Randall is a 60 y.o. male admitted on 11/27/2021 with  endocarditis/pna , LE cellulitis.  Pharmacy has been consulted for cefepime and vancomycin dosing. Also started on fluconazole per MD. SCr up a bit to 1.02.  Plan: Cefepime 2 gm IV Q 8 hours Fluconazole 100mg  PO daily per MD Adjust vancomycin to 1500 mg IV Q 24 hrs for renal function. Goal AUC 400-550. Expected AUC: 470 SCr used: 1.02 Monitor CBC, renal fx, cultures and clinical progress Vancomycin levels as indicated   Height: 5\' 9"  (175.3 cm) Weight: 92.4 kg (203 lb 11.3 oz) IBW/kg (Calculated) : 70.7  Temp (24hrs), Avg:98.2 F (36.8 C), Min:97.6 F (36.4 C), Max:98.6 F (37 C)  Recent Labs  Lab 11/24/21 1535 11/27/21 1142 11/27/21 1719 11/27/21 1955 11/27/21 2031 11/28/21 0031  WBC 52.5* 67.0* 59.3*  --   --  55.3*  CREATININE 0.82 0.82 0.89 0.88  --  1.02  LATICACIDVEN  --   --  1.9  --  1.7  --      Estimated Creatinine Clearance: 87.6 mL/min (by C-G formula based on SCr of 1.02 mg/dL).    Allergies  Allergen Reactions   Iopamidol Hives and Itching    Isovue break through with prep, Hives and Itching  Patient had PET?CT scan with 125 mls of Isovue-300. Noted to have two raised itchy hives which self-resolved. Patient had PET/CT scan with contrast on 05/27/2014 while on prednisone prep (3 doses of 50 mg-13 hour prep) and noted to have two hives which self -resolved without treatment   Iodinated Diagnostic Agents Rash   Iodine-131 Rash    Antimicrobials this admission: Cefepime 12/9 >>  Vancomycin 12/10 >> Fluconazole 12/10 >>   Microbiology results: 12/9 BCx: ngtd    Arturo Morton, PharmD, BCPS Please check AMION for all [facility] contact numbers Clinical Pharmacist 11/28/2021 10:06 AM

## 2021-11-28 NOTE — Progress Notes (Signed)
Patient stated he would prefer me not to go through his backpack. I told him taking an inventory of personal belongings upon admission is part of our normal assessment. Patient refused to allow me to go through backpack. Patient stated all that was in there is clothing, toothbrush, toothpaste, deodorant.

## 2021-11-28 NOTE — Progress Notes (Signed)
This encounter was created in error - please disregard.

## 2021-11-28 NOTE — Progress Notes (Signed)
Patient refusing nasal swab for respiratory panel.

## 2021-11-28 NOTE — Progress Notes (Signed)
E-Link notified of WBCs of 55.3.

## 2021-11-28 NOTE — Progress Notes (Addendum)
NAME:  Christopher Randall, MRN:  [id number], DOB:  1961/01/07, LOS: 1 ADMISSION DATE:  11/27/2021, CONSULTATION DATE:  70 REFERRING MD:  Dr Rogers Blocker, CHIEF COMPLAINT: Lower extremity edema  History of Present Illness:  [name]  He presented with increasing shortness of breath and lower extremity edema with skin breakdown and associated erythema.  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  He had undergone 1 session of chemotherapy as well as radiation.  Over the past week the patient has had increasing confusion and word searching in addition to a ecchymotic rash that started on both ankles.  The swelling in his ankles is also gotten significantly worse over the last week.  He was seen in the oncology clinic today.  Ultimately he was referred to the emergency room for further evaluation of the legs.  Patient was also being worked up for the confusion.  MRI was obtained today.  This demonstrates a embolic CVA.  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  Discussed the case with Dr. Eliberto Ivory and Dr. Gaynelle Arabian.  Patient will be transferred from the medical floor to the intensive care unit for further work-up.  Patient continues to deny any chest pain/chest pressure.  Objective   Blood pressure (!) 149/98, pulse  (!) 119, temperature 97.8 F (36.6 C), temperature source Axillary, resp. rate 16, height 5\' 9"  (1.753 m), weight 92.4 kg, SpO2 95 %.        Intake/Output Summary (Last 24 hours) at 11/28/2021 1017 Last data filed at 11/28/2021 0800 Gross per 24 hour  Intake 1203.19 ml  Output 1075 ml  Net 128.19 ml   Filed Weights   11/28/21 0500  Weight: 92.4 kg    General: Well-nourished well-developed male no acute distress HEENT: MM pink/moist no JVD is appreciated Neuro: Some difficulty finding words moves all extremities able to interact with appropriate conversation CV: Sinus tach PULM: Decreased breath sounds in the bases  GI: soft, bsx4 active  GU: Voids Extremities: warm/dry, positive edema , bilateral feet with excoriation               Assessment & Plan:  Lower extrem cellulitis Continue to address daily   Volume overload  Diuresis as tolerated Strict INO   Small Cell lung CA with mets to the brain He has had cranial radiation plus chemotherapy Oncology is following   Thrombocytopenia Despite transfusion of platelets platelets remain 28 Monitor placed Steroids  Hyponatremia (hypervolemic hyponatremia) Recent Labs  Lab 11/27/21 1719 11/27/21 1955 11/28/21 0031  NA 119* 120* 121*  Monitor Need for intervention at this time  Deep vein thrombosis of left gastrocnemius vein Currently on heparin drip   Leukocytosis Empirical antimicrobial therapy    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   STEMI Currently on heparin therapy Cardiology following     Best Practice (right click and "Reselect all SmartList Selections" daily)   Diet/type: Regular consistency (see orders)  DVT prophylaxis: systemic heparin GI prophylaxis: PPI Lines: N/A Foley:  N/A Code Status:  full code Last date of multidisciplinary goals of care discussion [tbd]  11/28/2021 patient and family updated at bedside Labs   CBC: Recent Labs  Lab 11/24/21 1535  11/27/21 1142 11/27/21 1719 11/28/21 0031  WBC 52.5* 67.0* 59.3* 55.3*  NEUTROABS 39.9* 53.2* 54.0*  --   HGB 12.2* 11.9* 12.2* 11.3*  HCT 35.1* 34.0* 34.6* 32.0*  MCV 88.9 88.5 89.4 88.2  PLT 34* 29* 28* 31*    Basic Metabolic Panel: Recent Labs  Lab 11/24/21 1535 11/27/21 1142 11/27/21 1719 11/27/21 1955 11/28/21 0031  NA 127* 120* 119* 120* 121*  K 4.3 4.0 4.0 3.9 3.7  CL 89* 83* 81* 83* 82*  CO2 23 25 23 23  21*  GLUCOSE 101* 93 93 88 90  BUN 20 18 17 16 17   CREATININE 0.82 0.82 0.89 0.88 1.02  CALCIUM 8.2* 8.0* 8.1* 7.9* 7.7*   GFR: Estimated Creatinine Clearance: 87.6 mL/min (by C-G formula based on SCr of 1.02 mg/dL). Recent Labs  Lab 11/24/21 1535 11/27/21 1142 11/27/21 1719 11/27/21 1955 11/27/21 2031 11/28/21 0031  PROCALCITON  --   --   --  0.80  --  1.05  WBC 52.5* 67.0* 59.3*  --   --  55.3*  LATICACIDVEN  --   --  1.9  --  1.7  --     Liver Function Tests: Recent Labs  Lab 11/24/21 1535 11/27/21 1142 11/27/21 1719 11/28/21 0031  AST 38 40 42* 39  ALT 38 31 33 31  ALKPHOS 87 161* 146* 133*  BILITOT 0.6 0.4 0.7 1.0  PROT 5.8* 5.6* 5.4* 4.9*  ALBUMIN 2.6* 2.7* 2.6* 2.4*   No results for input(s): LIPASE, AMYLASE in the last 168 hours. Recent Labs  Lab 11/27/21 2247  AMMONIA 18    ABG No results found for: PHART, PCO2ART, PO2ART, HCO3, TCO2, ACIDBASEDEF, O2SAT   Coagulation Profile: Recent Labs  Lab 11/27/21 1719 11/28/21 0031  INR 1.6* 1.5*    Cardiac Enzymes: No results for input(s): CKTOTAL, CKMB, CKMBINDEX, TROPONINI in the last 168 hours.  HbA1C: Hgb A1c MFr Bld  Date/Time Value Ref Range Status  11/28/2021 12:31 AM 5.8 (H) 4.8 - 5.6 % Final    Comment:    (NOTE) Pre diabetes:          5.7%-6.4%  Diabetes:              >6.4%  Glycemic control for   <7.0% adults with diabetes   10/07/2021 04:25 AM 5.5 4.8 - 5.6 % Final    Comment:    (NOTE) Pre diabetes:          5.7%-6.4%  Diabetes:               >6.4%  Glycemic control for   <7.0% adults with diabetes     CBG: No results for input(s): GLUCAP in the last 168 hours.    Critical care time: 45 min     Richardson Landry Minor ACNP Acute Care Nurse Practitioner [place] Please consult [place] 11/28/2021, 10:18 AM   Attending Note:  I have examined patient, reviewed labs, studies and notes.   60 year old man with a history of extensive stage small cell lung cancer with metastatic disease to brain.  He is receiving brain XRT, got his first chemotherapy and immunotherapy on 12/2.  Over the past week he has been having increased confusion and some difficulty word  finding.  He then developed bibasilar lower extremity edema and petechial rash.  Has been found to have embolic CVA on MRI brain 12/9.  Bilateral lower extremity Dopplers identifies bilateral DVT.  TTE with bubble study pending 12/10.  Platelets at presentation ~30k, 31k this morning.  He was started on heparin and aspirin, aspirin has been held.  Vitals:   11/28/21 0900 11/28/21 1000 11/28/21 1100 11/28/21 1200  BP: (!) 156/92 (!) 116/94 137/84   Pulse: (!) 125 (!) 125 (!) 119   Resp: 17 (!) 26 19   Temp:    (!) 96.6 F (35.9 C)  TempSrc:    Axillary  SpO2: 95% 90% 97% 91%  Weight:      Height:      Chronically ill-appearing gentleman sitting up in bed.  He intermittently falls asleep during conversation.  When he is awake he is oriented, answers questions appropriately.  He has some subtle word finding difficulty.  No dysarthria.  Good strength.  Lungs clear bilaterally.  Heart is regular, borderline tachycardic.  Abdomen nondistended with positive bowel sounds.  He has significant erythema on both feet with old ecchymotic rash present.  No active bleeding.  1+ edema feet and pretibial area.  Bilateral lower extremity cellulitis, covering currently with vancomycin and cefepime.  Bilateral lower extremity DVT.  Aspirin has been held, he remains on heparin  infusion.  I think we will need to hold heparin until his platelets rebound to >50k to mitigate bleeding risk especially in the setting of embolic CVA.  Would consider restarting it when platelets are improved.  This will become an issue when considering chronic anticoagulation which he will probably need for at least 3 months -his platelets are likely to drop in association with future chemotherapy treatments.  If he is unable to stay on anticoagulation then could consider an IVC filter.  Embolic CVAs.  Concerning for possible embolism from his DVTs.  He has TTE with bubble study planned for today.  ST elevation MI.  Appreciate cardiology input.  Again intervention will be complicated by the fact that he likely will not tolerate antiplatelet agents as long as he is requiring chemo.  Catheterization deferred.  Independent critical care time is 31 minutes.   Baltazar Apo, MD, PhD 11/28/2021, 1:20 PM [facility] [phone] or if no answer [phone]

## 2021-11-28 NOTE — Consult Note (Signed)
Referral MD  Reason for Referral: Metastatic non-small cell lung cancer; multiple CNS infarcts; possible cellulitis; hyponatremia  No chief complaint on file. : My legs were swollen.  HPI: Christopher Randall is a really nice 60 year old white male.  He is followed by Dr. Irene Limbo.  He was recently diagnosed with metastatic non-small cell lung cancer.  He has a remote history of melanoma.  He had brain metastasis.  He has had radiosurgery.  He has been on 1 cycle of treatment with chemotherapy with carboplatinum/Alimta/pembrolizumab.  He apparently has been having problem with his legs.  Is a lot of swelling in the legs.  He had a Doppler of the legs in the office which was negative.  Here apparently was seen in the office on Friday, 11/27/2021.  Again, there is some issues.  He was somewhat confused.  He was weak.  He had a lot of swelling in his legs.  He subsequently was sent to the emergency room.  He had MRI of the brain which showed multiple areas of embolic infarct.  Where he had the radiosurgery, these lesions were little bit smaller.  There is no further edema.  His labs show sodium 119.  Potassium 4.  BUN 17 creatinine 0.89.  Calcium 8.1 with an albumin of 2.6.  His white cell count is 59.  Hemoglobin 12.2.  Platelet count 28,000.  The white cell count elevation could still be from infection or could be from steroids.  He had a chest x-ray which showed possible multifocal pneumonia.  This morning, he seems better from what I can tell.  He was very talkative.  He may have had a little bit of memory difficulty.  I noted that his left arm was quite swollen and red.  He has an IV in that arm.  He says his elbow has been bothering him.  We probably need to get a CT of the extremity to see what might be going on.  He is not eating much.  From what his son says, he has been declining.  He has just been weaker.  He has had no obvious fever.  There is been no obvious bleeding.  He has had no  nausea or vomiting.  There is been no diarrhea.  He has had no bowel or bladder incontinence.  Currently, I would say performance status is probably ECOG 1.    Past Medical History:  Diagnosis Date   Abnormal TSH 09/28/2017   Anemia 11/27/2021   Anxiety 07/03/2012   Depression    Difficulty sleeping 09/28/2017   Examination of participant in clinical trial 09/11/2012   History of immunotherapy 02/23/2016   Melanoma of skin 10/26/2011   Nicotine dependence 07/17/2015   Smoker    STEMI (ST elevation myocardial infarction) ([place]) 11/27/2021  :   Past Surgical History:  Procedure Laterality Date   BRONCHIAL BIOPSY  10/09/2021   Procedure: BRONCHIAL BIOPSIES;  Surgeon: [name], MD;  Location: [facility];  Service: Endoscopy;;   BRONCHIAL BRUSHINGS  10/09/2021   Procedure: BRONCHIAL BRUSHINGS;  Surgeon: [name], MD;  Location: [facility];  Service: Endoscopy;;   ENDOBRONCHIAL ULTRASOUND N/A 10/09/2021   Procedure: ENDOBRONCHIAL ULTRASOUND;  Surgeon: [name], MD;  Location: [facility];  Service: Endoscopy;  Laterality: N/A;   FINE NEEDLE ASPIRATION  10/09/2021   Procedure: FINE NEEDLE ASPIRATION (FNA) LINEAR;  Surgeon: [name], MD;  Location: [facility];  Service: Endoscopy;;   LEFT POPLITEAL MASS RESECTION  08/27/2011  pigmented villonodular synovitis, negative for malignancy   LYMPH GLAND EXCISION  07/12/2011   right axilla, consistent with melanoma   LYMPH NODE DISSECTION  08/27/2011   right, 0/17 nodes positive   VIDEO BRONCHOSCOPY N/A 10/09/2021   Procedure: VIDEO BRONCHOSCOPY WITHOUT FLUORO;  Surgeon: [name], MD;  Location: [facility];  Service: Endoscopy;  Laterality: N/A;  :   Current Facility-Administered Medications:     stroke: mapping our early stages of recovery book, , Does not apply, Once, Orma Flaming, MD   0.9 %  sodium chloride infusion (Manually program via Guardrails IV Fluids), , Intravenous,  Once, Deland Pretty, MD   0.9 %  sodium chloride infusion, , Intravenous, PRN, Orma Flaming, MD, Last Rate: 10 mL/hr at 11/28/21 0400, Infusion Verify at 11/28/21 0400   acetaminophen (TYLENOL) tablet 650 mg, 650 mg, Oral, Q6H PRN **OR** acetaminophen (TYLENOL) suppository 650 mg, 650 mg, Rectal, Q6H PRN, Orma Flaming, MD   aspirin chewable tablet 81 mg, 81 mg, Oral, Daily, Deland Pretty, MD   ceFEPIme (MAXIPIME) 2 g in sodium chloride 0.9 % 100 mL IVPB, 2 g, Intravenous, Q8H, Mancheril, Darnell Level, RPH, Last Rate: 200 mL/hr at 11/28/21 0350, 2 g at 11/28/21 0938   Chlorhexidine Gluconate Cloth 2 % PADS 6 each, 6 each, Topical, Daily, Orma Flaming, MD   clonazePAM [facility]) tablet 0.5 mg, 0.5 mg, Oral, BID PRN, Orma Flaming, MD   dexamethasone (DECADRON) tablet 1 mg, 1 mg, Oral, Q breakfast, Orma Flaming, MD   folic acid (FOLVITE) tablet 1 mg, 1 mg, Oral, Daily, Orma Flaming, MD   furosemide (LASIX) injection 20 mg, 20 mg, Intravenous, Q8H, Deland Pretty, MD, 20 mg at 11/27/21 2353   heparin ADULT infusion 100 units/mL (25000 units/240mL), 900 Units/hr, Intravenous, Continuous, Franky Macho, RPH, Last Rate: 9 mL/hr at 11/28/21 0616, 900 Units/hr at 11/28/21 0616   HYDROcodone-acetaminophen (NORCO/VICODIN) 5-325 MG per tablet 1-2 tablet, 1-2 tablet, Oral, Q4H PRN, Orma Flaming, MD   magic mouthwash w/lidocaine, 5 mL, Oral, TID PRN, Orma Flaming, MD   ondansetron [facility]) tablet 4 mg, 4 mg, Oral, Q6H PRN **OR** ondansetron (ZOFRAN) injection 4 mg, 4 mg, Intravenous, Q6H PRN, Orma Flaming, MD   pantoprazole (PROTONIX) EC tablet 40 mg, 40 mg, Oral, Daily, Orma Flaming, MD   polyvinyl alcohol (LIQUIFILM TEARS) 1.4 % ophthalmic solution 1 drop, 1 drop, Both Eyes, QID PRN, Orma Flaming, MD   vancomycin (VANCOREADY) IVPB 1750 mg/350 mL, 1,750 mg, Intravenous, Q24H, Mancheril, Darnell Level, RPH   vitamin B-12 (CYANOCOBALAMIN) tablet 1,000 mcg, 1,000 mcg,  Oral, Daily, Orma Flaming, MD:    stroke: mapping our early stages of recovery book   Does not apply Once   sodium chloride   Intravenous Once   aspirin  81 mg Oral Daily   Chlorhexidine Gluconate Cloth  6 each Topical Daily   dexamethasone  1 mg Oral Q breakfast   folic acid  1 mg Oral Daily   furosemide  20 mg Intravenous Q8H   pantoprazole  40 mg Oral Daily   vitamin B-12  1,000 mcg Oral Daily  :   Allergies  Allergen Reactions   Iopamidol Hives and Itching    Isovue break through with prep, Hives and Itching  Patient had PET?CT scan with 125 mls of Isovue-300. Noted to have two raised itchy hives which self-resolved. Patient had PET/CT scan with contrast on 05/27/2014 while on prednisone prep (3 doses of 50 mg-13 hour prep) and noted to  have two hives which self -resolved without treatment   Iodinated Diagnostic Agents Rash   Iodine-131 Rash  :   Family History  Problem Relation Age of Onset   Hypertension Mother    CAD Mother    Lung cancer Father   :   Social History   Socioeconomic History   Marital status: Married    Spouse name: Not on file   Number of children: 3   Years of education: Not on file   Highest education level: Not on file  Occupational History    Employer: [employer]  Tobacco Use   Smoking status: Former    Packs/day: 0.25    Types: Cigarettes   Smokeless tobacco: Never  Vaping Use   Vaping Use: Never used  Substance and Sexual Activity   Alcohol use: Yes    Alcohol/week: 6.0 standard drinks    Types: 6 Shots of liquor per week   Drug use: No   Sexual activity: Not on file  Other Topics Concern   Not on file  Social History Narrative   Not on file   Social Determinants of Health   Financial Resource Strain: Not on file  Food Insecurity: No Food Insecurity   Worried About Running Out of Food in the Last Year: Never true   Ran Out of Food in the Last Year: Never true  Transportation Needs: No Transportation Needs   Lack of  Transportation (Medical): No   Lack of Transportation (Non-Medical): No  Physical Activity: Not on file  Stress: Not on file  Social Connections: Not on file  Intimate Partner Violence: Not on file  :  Pertinent items are noted in HPI.  Exam: Patient Vitals for the past 24 hrs:  BP Temp Temp src Pulse Resp SpO2 Height Weight  11/28/21 0620 (!) 143/88 97.8 F (36.6 C) Oral (!) 116 (!) 22 92 % -- --  11/28/21 0500 -- -- -- -- -- -- 5\' 9"  (1.753 m) 203 lb 11.3 oz (92.4 kg)  11/28/21 0424 (!) 158/120 98.4 F (36.9 C) Oral (!) 124 20 -- -- --  11/28/21 0400 (!) 139/96 -- Oral (!) 127 18 94 % -- --  11/28/21 0326 (!) 153/94 98.2 F (36.8 C) Oral (!) 128 (!) 24 94 % -- --  11/28/21 0300 (!) 141/81 -- -- (!) 122 (!) 21 96 % -- --  11/28/21 0200 (!) 154/135 -- -- (!) 118 15 96 % -- --  11/28/21 0141 (!) 142/102 98.6 F (37 C) Oral (!) 117 14 95 % -- --  11/28/21 0126 (!) 143/122 98.4 F (36.9 C) Oral (!) 122 18 94 % -- --  11/28/21 0100 (!) 132/96 -- -- (!) 123 16 94 % -- --  11/28/21 0000 (!) 122/102 -- -- (!) 128 (!) 24 95 % -- --  11/27/21 2300 133/90 -- -- (!) 124 14 96 % -- --  11/27/21 2200 (!) 145/112 -- -- (!) 124 (!) 30 94 % -- --  11/27/21 2100 -- -- -- (!) 123 (!) 25 95 % -- --  11/27/21 2000 -- 98.5 F (36.9 C) Oral -- -- -- -- --  11/27/21 2000 -- -- -- (!) 118 (!) 24 96 % -- --  11/27/21 1600 (!) 136/114 97.6 F (36.4 C) -- (!) 117 18 92 % -- --   This is a fairly well-developed well-nourished white male.  He is alert.  He is oriented.  His vital signs are temperature 97.8.  Pulse 116.  Blood pressure 143/88.  His head neck exam shows no scleral icterus.  There is no oral lesions.  He has no adenopathy in the neck.  Lungs show decent air movement bilaterally.  He has some wheezing over on the right side.  Cardiac exam is tachycardic but regular.  I do not hear any obvious murmurs.  Abdomen is soft.  Bowel sounds are present.  He has no obvious fluid wave.  There is no  palpable liver or spleen tip.  Extremities shows significant edema down the lower legs.  He has skin breakdown in the lower legs.  He has almost looks like some vasculitis type changes in the lower legs.  In the left arm, it is swollen and red.  There is erythema in the flexor aspect of the forearm.  Neurological exam shows no focal neurological deficits.   Recent Labs    11/27/21 1719 11/28/21 0031  WBC 59.3* 55.3*  HGB 12.2* 11.3*  HCT 34.6* 32.0*  PLT 28* 31*    Recent Labs    11/27/21 1955 11/28/21 0031  NA 120* 121*  K 3.9 3.7  CL 83* 82*  CO2 23 21*  GLUCOSE 88 90  BUN 16 17  CREATININE 0.88 1.02  CALCIUM 7.9* 7.7*    Blood smear review: None  Pathology: None    Assessment and Plan: Mr. Wendt is a really nice 60 year old male.  He has metastatic non-small cell lung cancer.  He has CNS metastasis.  These were treated with radiosurgery.  He has had systemic chemotherapy.  He is on had 1 cycle.  Again, I think the real issue is why he is having these embolic strokes.  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  He has adenocarcinoma of the lung so he would be at risk for marantic endocarditis.  He probably does need to have a TEE but his platelet count is too low for this.  I am sure his platelet count is low secondary to the chemotherapy.  I would have to believe that the elevated white cell count is from infection or steroids.  Could also be from the underlying malignancy.  I do not see he has had G-CSF after chemotherapy.  Is low sodium could certainly be from dehydration.  He has a low chloride which we typically see with dehydration.  His albumin is also somewhat on the low side.  He is on heparin now.  I think this is very reasonable.  I would not give him a bolus dose because of the CNS metastasis.  However, these have been treated.  Of note, I noted that his troponin I was quite high.  Again I am not sure what this means.  I do worry about infection.  Again his  left arm looks like there is cellulitis.  He has a skin breakdown in the lower legs which concerned to be cellulitis.  Also could be vasculitis.  There are a lot of issues ongoing.  Hopefully, antibiotics will help quite a bit.  I know that he will get fantastic care from all staff up on [address].  I do appreciate all their help.  We will follow along and try to help out any way that we can.  Lattie Haw, MD  Romans 8:28

## 2021-11-28 NOTE — Progress Notes (Addendum)
Inpatient Rehab Admissions Coordinator:   Per therapy recommendations,  patient was screened for CIR candidacy by Clemens Catholic, MS, CCC-SLP. At this time, Pt. Appears to demonstrate medical necessity, functional decline, and ability to tolerate intensity of CIR. Pt. is a potential candidate for CIR. I will request order for rehab consult per protocol for full assessment. Please contact me any with questions.Clemens Catholic, [place], [place] Admissions Coordinator  [phone] ([place]) [phone] (office)

## 2021-11-28 NOTE — Progress Notes (Signed)
ANTICOAGULATION CONSULT NOTE - Initial Consult  Pharmacy Consult for Heparin Indication: chest pain/ACS  Allergies  Allergen Reactions   Iopamidol Hives and Itching    Isovue break through with prep, Hives and Itching  Patient had PET?CT scan with 125 mls of Isovue-300. Noted to have two raised itchy hives which self-resolved. Patient had PET/CT scan with contrast on 05/27/2014 while on prednisone prep (3 doses of 50 mg-13 hour prep) and noted to have two hives which self -resolved without treatment   Iodinated Diagnostic Agents Rash   Iodine-131 Rash    Patient Measurements: Height: 5\' 9"  (175.3 cm) Weight: 92.4 kg (203 lb 11.3 oz) IBW/kg (Calculated) : 70.7 Heparin Dosing Weight: 90 kg  Vital Signs: Temp: 98.4 F (36.9 C) (12/10 0424) Temp Source: Oral (12/10 0424) BP: 158/120 (12/10 0424) Pulse Rate: 124 (12/10 0424)  Labs: Recent Labs    11/27/21 1142 11/27/21 1719 11/27/21 1927 11/27/21 1955 11/27/21 2247 11/28/21 0031  HGB 11.9* 12.2*  --   --   --  11.3*  HCT 34.0* 34.6*  --   --   --  32.0*  PLT 29* 28*  --   --   --  31*  LABPROT  --  19.0*  --   --   --  18.1*  INR  --  1.6*  --   --   --  1.5*  CREATININE 0.82 0.89  --  0.88  --  1.02  TROPONINIHS  --   --  2,168*  --  2,425*  --     Estimated Creatinine Clearance: 87.6 mL/min (by C-G formula based on SCr of 1.02 mg/dL).   Medical History: Past Medical History:  Diagnosis Date   Abnormal TSH 09/28/2017   Anemia 11/27/2021   Anxiety 07/03/2012   Depression    Difficulty sleeping 09/28/2017   Examination of participant in clinical trial 09/11/2012   History of immunotherapy 02/23/2016   Melanoma of skin 10/26/2011   Nicotine dependence 07/17/2015   Smoker    STEMI (ST elevation myocardial infarction) ([place]) 11/27/2021    Medications:  Scheduled:    stroke: mapping our early stages of recovery book   Does not apply Once   sodium chloride   Intravenous Once   aspirin  81 mg Oral Daily    aspirin  325 mg Oral Once   Chlorhexidine Gluconate Cloth  6 each Topical Daily   dexamethasone  1 mg Oral Q breakfast   folic acid  1 mg Oral Daily   furosemide  20 mg Intravenous Q8H   pantoprazole  40 mg Oral Daily   vitamin B-12  1,000 mcg Oral Daily   Infusions:   sodium chloride 10 mL/hr at 11/28/21 0400   ceFEPime (MAXIPIME) IV Stopped (11/28/21 0001)   vancomycin      Assessment: 60 y.o. M presents with dyspnea and lower extremity weeping. Pt with lung cancer and known brain mets. Pt with b/l embolic strokes on MRI - neurology following. Now found to have STEMI - unable to go to emergent [facility] given severe thrombocytopenia so plan to treat medically for time being with heparin and ASA. Pharmacy consulted for heparin dosing after platelets given. Plt 31 (prior to 2 platelet units infused). CCM, neurology, and cardiology aware of bleeding risks. H/H stable.  Goal of Therapy:  Heparin level 0.3-0.5 units/ml Monitor platelets by anticoagulation protocol: Yes   Plan:  Heparin gtt at 900 units/hr. No bolus with severe thrombocytopenia and recent CVA. Will f/u heparin  level and CBC in 8 hours Daily heparin level and CBC F/u platelets closely and for any s/s bleeding   Sherlon Handing, PharmD, BCPS Please see amion for complete clinical pharmacist phone list 11/28/2021,5:48 AM

## 2021-11-28 NOTE — Progress Notes (Signed)
  Echocardiogram 2D Echocardiogram has been performed.  Johny Chess 11/28/2021, 2:21 PM

## 2021-11-28 NOTE — Progress Notes (Signed)
STROKE TEAM PROGRESS NOTE   SUBJECTIVE (INTERVAL HISTORY) His son and wife are at the bedside.  Pt lying in bed, echo tech is working on him. Per wife and son, he had b/l LE swelling for a week and b/l foreleg weeping wound for several days. MRI showed multifocal punctate infarcts, consistent with embolic shower. LE venous doppler showed b/l distal DVTs. Cardiology on board for possible STEMI but felt more stress related cardiomyopathy. Still has thrombocytopenia, ASA discontinued. Still has leukocytosis, tachycardia, and possible cellulitis b/l LEs.    OBJECTIVE Temp:  [97.6 F (36.4 C)-98.6 F (37 C)] 97.8 F (36.6 C) (12/10 0800) Pulse Rate:  [116-128] 119 (12/10 1100) Cardiac Rhythm: Sinus tachycardia (12/10 0800) Resp:  [14-33] 19 (12/10 1100) BP: (104-158)/(81-135) 137/84 (12/10 1100) SpO2:  [90 %-97 %] 97 % (12/10 1100) Weight:  [92.4 kg] 92.4 kg (12/10 0500)  No results for input(s): GLUCAP in the last 168 hours. Recent Labs  Lab 11/24/21 1535 11/27/21 1142 11/27/21 1719 11/27/21 1955 11/28/21 0031  NA 127* 120* 119* 120* 121*  K 4.3 4.0 4.0 3.9 3.7  CL 89* 83* 81* 83* 82*  CO2 _0 21*  GLUCOSE 101* 93 93 88 90  BUN _1 CREATININE 0.82 0.82 0.89 0.88 1.02  CALCIUM 8.2* 8.0* 8.1* 7.9* 7.7*   Recent Labs  Lab 11/24/21 1535 11/27/21 1142 11/27/21 1719 11/28/21 0031  AST 38 40 42* 39  ALT 38 31 33 31  ALKPHOS 87 161* 146* 133*  BILITOT 0.6 0.4 0.7 1.0  PROT 5.8* 5.6* 5.4* 4.9*  ALBUMIN 2.6* 2.7* 2.6* 2.4*   Recent Labs  Lab 11/24/21 1535 11/27/21 1142 11/27/21 1719 11/28/21 0031 11/28/21 1016  WBC 52.5* 67.0* 59.3* 55.3* 43.9*  NEUTROABS 39.9* 53.2* 54.0*  --  PENDING  HGB 12.2* 11.9* 12.2* 11.3* 10.8*  HCT 35.1* 34.0* 34.6* 32.0* 30.2*  MCV 88.9 88.5 89.4 88.2 88.3  PLT 34* 29* 28* 31* PENDING   No results for input(s): CKTOTAL, CKMB, CKMBINDEX, TROPONINI in the last 168 hours. Recent Labs    11/27/21 1719 11/28/21 0031   LABPROT 19.0* 18.1*  INR 1.6* 1.5*   No results for input(s): COLORURINE, LABSPEC, PHURINE, GLUCOSEU, HGBUR, BILIRUBINUR, KETONESUR, PROTEINUR, UROBILINOGEN, NITRITE, LEUKOCYTESUR in the last 72 hours.  Invalid input(s): APPERANCEUR     Component Value Date/Time   CHOL 173 11/28/2021 0031   TRIG 172 (H) 11/28/2021 0031   HDL 20 (L) 11/28/2021 0031   CHOLHDL 8.7 11/28/2021 0031   VLDL 34 11/28/2021 0031   LDLCALC 119 (H) 11/28/2021 0031   Lab Results  Component Value Date   HGBA1C 5.8 (H) 11/28/2021   No results found for: LABOPIA, COCAINSCRNUR, LABBENZ, AMPHETMU, THCU, LABBARB  No results for input(s): ETH in the last 168 hours.  I have personally reviewed the radiological images below and agree with the radiology interpretations.  DG Chest 2 View  Result Date: 11/27/2021 CLINICAL DATA:  Suspected sepsis EXAM: CHEST - 2 VIEW COMPARISON:  10/27/2021 FINDINGS: Heart is normal size. Bilateral airspace disease, most pronounced in the right lower lobe and left upper lobe. Small right pleural effusion. No acute bony abnormality. IMPRESSION: Bilateral airspace disease with small right effusion. Findings are concerning for multifocal pneumonia. Electronically Signed   By: Rolm Baptise M.D.   On: 11/27/2021 18:14   MR BRAIN W WO CONTRAST  Result Date: 11/27/2021 CLINICAL DATA:  Brain metastasis. Brain/central nervous system neoplasm; assess for treatment response. EXAM:  MRI HEAD WITHOUT AND WITH CONTRAST TECHNIQUE: Multiplanar, multiecho pulse sequences of the brain and surrounding structures were obtained without and with intravenous contrast. CONTRAST:  62m GADAVIST GADOBUTROL 1 MMOL/ML IV SOLN COMPARISON:  MR head 10/15/2021. FINDINGS: Motion artifact is present. Brain: The largest, likely extra-axial lesion along the right falx has decreased in size measuring approximately 1.2 x 0.6 cm (previously 2.1 x 1.2 cm). Left parasagittal frontal lesion measures approximately 5 mm (series 17, image  102) is slightly smaller (previously 6 mm). The small left parietal lesion is not definitely seen. Extensive edema in the right frontal lobe has nearly resolved. Edema in the anterior left frontal lobe has resolved. No definite new mass or abnormal enhancement. There are numerous foci of diffusion hyperintensity the cerebral hemispheres involving all but the temporal lobes as well as the cerebellar hemispheres and thalami. No hydrocephalus or extra-axial collection. Vascular: Major vessel flow voids at the skull base are preserved. Skull and upper cervical spine: Normal marrow signal is preserved. Sinuses/Orbits: Paranasal sinuses are aerated. Orbits are unremarkable. Other: Sella is unremarkable.  Mastoid air cells are clear. IMPRESSION: Motion degraded study. Positive treatment response as detailed above. No definite new metastasis. Numerous small foci of acute to subacute infarcts involving multiple vascular territories suggestive of a central embolic source. These results will be called to the ordering clinician or representative by the Radiologist Assistant, and communication documented in the PACS or [employer] Electronically Signed   By: PMacy MisM.D.   On: 11/27/2021 13:05   VAS [place]  Result Date: 11/28/2021 Carotid Arterial Duplex Study Patient Name:  Christopher Randall Date of Exam:   11/28/2021 Medical Rec #: [id number]         Accession #:    [id number] of Birth: [birth year]         Patient Gender: M Patient Age:   89years [facility]      [facility] [place] Referring Phys: SAlferd PateeKHoward County G[facility] LLC--------------------------------------------------------------------------------  Indications:       CVA. Risk Factors:      Past history of smoking, prior MI. Comparison Study:  No prior study Performing Technologist: MMaudry MayhewMHA, RDMS, RVT, RDCS  Examination Guidelines: A complete evaluation includes B-mode imaging, spectral Doppler, color  Doppler, and power Doppler as needed of all accessible portions of each vessel. Bilateral testing is considered an integral part of a complete examination. Limited examinations for reoccurring indications may be performed as noted.  Right Carotid Findings: +----------+--------+-------+--------+----------------------+------------------+           PSV cm/sEDV    StenosisPlaque Description    Comments                             cm/s                                                    +----------+--------+-------+--------+----------------------+------------------+ CCA Prox  103     24                                   intimal thickening +----------+--------+-------+--------+----------------------+------------------+ CCA Distal57      13  intimal thickening +----------+--------+-------+--------+----------------------+------------------+ ICA Prox  37      17             smooth and                                                                heterogenous                             +----------+--------+-------+--------+----------------------+------------------+ ICA Distal66      30                                                      +----------+--------+-------+--------+----------------------+------------------+ ECA       125     12             smooth and                                                                heterogenous                             +----------+--------+-------+--------+----------------------+------------------+ +----------+--------+-------+----------------+-------------------+           PSV cm/sEDV cmsDescribe        Arm Pressure (mmHG) +----------+--------+-------+----------------+-------------------+ [id number]             Multiphasic, WNL                    +----------+--------+-------+----------------+-------------------+ +---------+--------+--+--------+-+---------+ VertebralPSV  cm/s20EDV cm/s6Antegrade +---------+--------+--+--------+-+---------+  Left Carotid Findings: +----------+--------+-------+--------+----------------------+------------------+           PSV cm/sEDV    StenosisPlaque Description    Comments                             cm/s                                                    +----------+--------+-------+--------+----------------------+------------------+ CCA Prox  117     23                                   intimal thickening +----------+--------+-------+--------+----------------------+------------------+ CCA Distal58      17                                                      +----------+--------+-------+--------+----------------------+------------------+ ICA Prox  34      11  smooth and                                                                heterogenous                             +----------+--------+-------+--------+----------------------+------------------+ ICA Distal75      34                                                      +----------+--------+-------+--------+----------------------+------------------+ ECA       73      10                                                      +----------+--------+-------+--------+----------------------+------------------+ +----------+--------+--------+----------------+-------------------+           PSV cm/sEDV cm/sDescribe        Arm Pressure (mmHG) +----------+--------+--------+----------------+-------------------+ [id number]             Multiphasic, WNL                    +----------+--------+--------+----------------+-------------------+ +---------+--------+--+--------+--+---------+ VertebralPSV cm/s63EDV cm/s32Antegrade +---------+--------+--+--------+--+---------+   Summary: Right Carotid: Velocities in the right ICA are consistent with a 1-39% stenosis. Left Carotid: Velocities in the left ICA are consistent with a  1-39% stenosis.               Heterogenous area of the left subclavian area noted; etiology               unknown. Vertebrals:  Bilateral vertebral arteries demonstrate antegrade flow. Subclavians: Normal flow hemodynamics were seen in bilateral subclavian              arteries. *See table(s) above for measurements and observations.     Preliminary    VAS [place] LOWER EXTREMITY VENOUS (DVT) (ONLY [facility] & [facility])  Result Date: 11/28/2021  Lower Venous DVT Study Patient Name:  Christopher Randall  Date of Exam:   11/28/2021 Medical Rec #: [id number]          Accession #:    [id number] Date of Birth: June 25, 1961          Patient Gender: M Patient Age:   60 years Exam Location[facility]  [facility] Procedure:      VAS [place] LOWER EXTREMITY VENOUS (DVT) Referring Phys: JOSHUA LONG --------------------------------------------------------------------------------  Indications: Edema, and ulceration.  Risk Factors: Cancer Metastatic non-small cell lung cancer. Comparison Study: No prior study Performing Technologist: Maudry Mayhew MHA, RDMS, RVT, RDCS  Examination Guidelines: A complete evaluation includes B-mode imaging, spectral Doppler, color Doppler, and power Doppler as needed of all accessible portions of each vessel. Bilateral testing is considered an integral part of a complete examination. Limited examinations for reoccurring indications may be performed as noted. The reflux portion of the exam is performed with the patient in reverse Trendelenburg.  +---------+---------------+---------+-----------+----------+--------------+ RIGHT    CompressibilityPhasicitySpontaneityPropertiesThrombus Aging +---------+---------------+---------+-----------+----------+--------------+ CFV  Full           Yes      Yes                                 +---------+---------------+---------+-----------+----------+--------------+ SFJ      Full                                                         +---------+---------------+---------+-----------+----------+--------------+ FV Prox  Full                                                        +---------+---------------+---------+-----------+----------+--------------+ FV Mid   Full                                                        +---------+---------------+---------+-----------+----------+--------------+ FV DistalFull                                                        +---------+---------------+---------+-----------+----------+--------------+ PFV      Full                                                        +---------+---------------+---------+-----------+----------+--------------+ POP      Full           Yes      Yes                                 +---------+---------------+---------+-----------+----------+--------------+ PTV      Full                                                        +---------+---------------+---------+-----------+----------+--------------+ PERO     Full                                                        +---------+---------------+---------+-----------+----------+--------------+ Gastroc  None                    No                   Acute          +---------+---------------+---------+-----------+----------+--------------+   +---------+---------------+---------+-----------+----------+--------------+ LEFT     CompressibilityPhasicitySpontaneityPropertiesThrombus  Aging +---------+---------------+---------+-----------+----------+--------------+ CFV      Full           Yes      Yes                                 +---------+---------------+---------+-----------+----------+--------------+ SFJ      Full                                                        +---------+---------------+---------+-----------+----------+--------------+ FV Prox  Full                                                         +---------+---------------+---------+-----------+----------+--------------+ FV Mid   Full                                                        +---------+---------------+---------+-----------+----------+--------------+ FV DistalFull                                                        +---------+---------------+---------+-----------+----------+--------------+ PFV      Full                                                        +---------+---------------+---------+-----------+----------+--------------+ POP      Full           Yes      Yes                                 +---------+---------------+---------+-----------+----------+--------------+ PTV      Full                                                        +---------+---------------+---------+-----------+----------+--------------+ PERO     Full                                                        +---------+---------------+---------+-----------+----------+--------------+ Gastroc  None                    No                   Acute          +---------+---------------+---------+-----------+----------+--------------+  Summary: RIGHT: - Findings consistent with acute deep vein thrombosis involving the right gastrocnemius veins. - No cystic structure found in the popliteal fossa.  LEFT: - Findings consistent with acute deep vein thrombosis involving the left gastrocnemius veins. - No cystic structure found in the popliteal fossa.  *See table(s) above for measurements and observations.    Preliminary      PHYSICAL EXAM  Temp:  [97.6 F (36.4 C)-98.6 F (37 C)] 97.8 F (36.6 C) (12/10 0800) Pulse Rate:  [116-128] 119 (12/10 1100) Resp:  [14-33] 19 (12/10 1100) BP: (104-158)/(81-135) 137/84 (12/10 1100) SpO2:  [90 %-97 %] 97 % (12/10 1100) Weight:  [92.4 kg] 92.4 kg (12/10 0500)  General - Well nourished, well developed, in no apparent distress.  Ophthalmologic - fundi not visualized due to  noncooperation.  Cardiovascular - Regular rhythm and tachycardia.  Mental Status -  Level of arousal and orientation to time, place, and person were intact. Language including expression, naming, repetition, comprehension was assessed and found intact. Fund of Knowledge was assessed and was intact.  Cranial Nerves II - XII - II - Visual field intact OU. III, IV, VI - Extraocular movements intact. V - Facial sensation intact bilaterally. VII - Facial movement intact bilaterally. VIII - Hearing & vestibular intact bilaterally. X - Palate elevates symmetrically. XI - Chin turning & shoulder shrug intact bilaterally. XII - Tongue protrusion intact.  Motor Strength - The patient's strength was normal in all extremities and pronator drift was absent.  Bulk was normal and fasciculations were absent.   Motor Tone - Muscle tone was assessed at the neck and appendages and was normal.  Reflexes - The patient's reflexes were symmetrical in all extremities and he had no pathological reflexes.  Sensory - Light touch, temperature/pinprick were assessed and were symmetrical.    Coordination - The patient had normal movements in the hands with no ataxia or dysmetria.  Tremor was absent.  Gait and Station - deferred.   ASSESSMENT/PLAN Christopher Randall is a 60 y.o. 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 No tPA given due to outside window, thrombocytopenia.    Stroke:  bilateral anterior and posterior punctate/small infarcts consistent with embolic shower embolic secondary to hypercoagulable state from advanced malignancy, libman-sacks endocarditis, or DVT in the setting of PFO MRI  b/l anterior and posterior punctate/small infarcts consistent with embolic shower MRA  pending Carotid Doppler  unremarkable 2D Echo  pending TEE can be considered once stable but not going to change  management.  LE venous doppler b/l distal DVT LDL 119 HgbA1c 5.8 SCDs for VTE prophylaxis No antithrombotic prior to admission, now on No antithrombotic. OK with heparin IV from neuro standpoint.  Ongoing aggressive stroke risk factor management Therapy recommendations:  pending Disposition:  pending, recommend palliative care on board for [place] discussion.   B/l DVT LE venous doppler showed b/l distal DVT Likely the cause of b/l LE swelling, weeping wound at forelegs  Likely due to hypercoagulable state from advance malignancy OK with heparin IV from neuro standpoint.   Elevated troponin Cardiology on board Concerning for stress related cardiomyopathy Not candidate for ischemic work up at this time ASA d/c'ed due to thrombocytopenia  Thrombocytopenia  Could be due to chemo therapy Platelet 29->28->31 ASA discontinued Close montioring  ? Cellulitis/sepsis Leukocytosis WBC 52.5->67->55.3 B/l foreleg swelling, redness with weeping wound Wound consult placed Tachycardia  CCM  on board On vanco/cefepime   Lung cancer with brain met Oncology following Undergoing chemo SRT for brain mets (b/l frontal lesions) On decadron   Hyperlipidemia Home meds:  none  LDL 119, goal < 70 Now on lipitor 40 Continue statin at discharge  Other Stroke Risk Factors Former Cigarette smoker, quit 8 weeks ago  Other Active Problems Tachycardia  Hyponatremia - monitoring  Hospital day # 1  This patient is critically ill due to embolic shower, hypercoagulable state from advanced malignancy, b/l DVT, leg wounds, cardiomyopathy, leukocytosis, thrombocytopenia and at significant risk of neurological worsening, death form recurrent stroke, PE, sepsis, heart failure, respiratory failure, bleeding from low platelet. This patient's care requires constant monitoring of vital signs, hemodynamics, respiratory and cardiac monitoring, review of multiple databases, neurological assessment, discussion  with family, other specialists and medical decision making of high complexity. I spent 45 minutes of neurocritical care time in the care of this patient. I discussed with Dr. Lamonte Sakai.    Rosalin Hawking, MD PhD Stroke Neurology 11/28/2021 11:59 AM    To contact Stroke Continuity provider, please refer to [email or web address] After hours, contact General Neurology

## 2021-11-28 NOTE — Progress Notes (Signed)
  Echocardiogram 2D Echocardiogram has been performed.  Christopher Randall 11/28/2021, 12:08 PM

## 2021-11-28 NOTE — Progress Notes (Addendum)
Cardiology Progress Note  Patient ID: Christopher Randall MRN: [id number] DOB: December 19, 1961 Date of Encounter: 11/28/2021  Primary Cardiologist: None  Subjective   Chief Complaint: None.   HPI: Admitted with confusion and LE cellulitis.  Found to have concerns for inferolateral STEMI on EKG.  Platelets severely decreased and not candidate for invasive angiography.  He was given platelets and started on aspirin and heparin.  MRI concerning for strokes.  ROS:  All other ROS reviewed and negative. Pertinent positives noted in the HPI.     Inpatient Medications  Scheduled Meds:   stroke: mapping our early stages of recovery book   Does not apply Once   sodium chloride   Intravenous Once   Chlorhexidine Gluconate Cloth  6 each Topical Daily   dexamethasone  1 mg Oral Q breakfast   fluconazole  100 mg Oral Daily   folic acid  1 mg Oral Daily   furosemide  20 mg Intravenous Q8H   pantoprazole  40 mg Oral Daily   vitamin B-12  1,000 mcg Oral Daily   Continuous Infusions:  sodium chloride Stopped (11/28/21 0716)   ceFEPime (MAXIPIME) IV Stopped (11/28/21 0703)   heparin 900 Units/hr (11/28/21 0800)   [START ON 11/29/2021] vancomycin     PRN Meds: sodium chloride, acetaminophen **OR** acetaminophen, clonazePAM, HYDROcodone-acetaminophen, magic mouthwash w/lidocaine, ondansetron **OR** ondansetron (ZOFRAN) IV, polyvinyl alcohol   Vital Signs   Vitals:   11/28/21 0600 11/28/21 0620 11/28/21 0700 11/28/21 0800  BP: 104/86 (!) 143/88 (!) 135/97 (!) 149/98  Pulse: (!) 123 (!) 116 (!) 118 (!) 119  Resp: (!) 33 (!) 22 16 16   Temp:  97.8 F (36.6 C)  97.8 F (36.6 C)  TempSrc:  Oral  Axillary  SpO2: 92% 92% 93% 95%  Weight:      Height:        Intake/Output Summary (Last 24 hours) at 11/28/2021 1017 Last data filed at 11/28/2021 0800 Gross per 24 hour  Intake 1203.19 ml  Output 1075 ml  Net 128.19 ml   Last 3 Weights 11/28/2021 11/27/2021 11/24/2021  Weight (lbs) 203 lb 11.3 oz  203 lb 12.8 oz 204 lb 12.8 oz  Weight (kg) 92.4 kg 92.443 kg 92.897 kg      Telemetry  Overnight telemetry shows sinus tachycardia heart rate 120s, which I personally reviewed.   ECG  The most recent ECG shows sinus tachycardia, inferolateral ST elevation noted, no reciprocal changes, which I personally reviewed.   Physical Exam   Vitals:   11/28/21 0600 11/28/21 0620 11/28/21 0700 11/28/21 0800  BP: 104/86 (!) 143/88 (!) 135/97 (!) 149/98  Pulse: (!) 123 (!) 116 (!) 118 (!) 119  Resp: (!) 33 (!) 22 16 16   Temp:  97.8 F (36.6 C)  97.8 F (36.6 C)  TempSrc:  Oral  Axillary  SpO2: 92% 92% 93% 95%  Weight:      Height:        Intake/Output Summary (Last 24 hours) at 11/28/2021 1017 Last data filed at 11/28/2021 0800 Gross per 24 hour  Intake 1203.19 ml  Output 1075 ml  Net 128.19 ml    Last 3 Weights 11/28/2021 11/27/2021 11/24/2021  Weight (lbs) 203 lb 11.3 oz 203 lb 12.8 oz 204 lb 12.8 oz  Weight (kg) 92.4 kg 92.443 kg 92.897 kg    Body mass index is 30.08 kg/m.  General: Ill-appearing Head: Atraumatic, normal size  Eyes: PEERLA, EOMI  Neck: Supple, no JVD Endocrine: No thryomegaly Cardiac: Normal S1,  S2; tachycardia, no murmurs Lungs: Diminished breath sounds bilaterally Abd: Soft, nontender, no hepatomegaly  Ext: Edema noted in the ankles, erythematous and swollen, hot to touch Musculoskeletal: No deformities Skin: Changes in lower extremities consistent with likely cellulitis Neuro: Awake, drowsy will follow commands  Labs  High Sensitivity Troponin:   Recent Labs  Lab 11/27/21 1927 11/27/21 2247  TROPONINIHS 2,168* 2,425*     Cardiac EnzymesNo results for input(s): TROPONINI in the last 168 hours. No results for input(s): TROPIPOC in the last 168 hours.  Chemistry Recent Labs  Lab 11/27/21 1142 11/27/21 1719 11/27/21 1955 11/28/21 0031  NA 120* 119* 120* 121*  K 4.0 4.0 3.9 3.7  CL 83* 81* 83* 82*  CO2 25 23 23  21*  GLUCOSE 93 93 88 90  BUN 18  17 16 17   CREATININE 0.82 0.89 0.88 1.02  CALCIUM 8.0* 8.1* 7.9* 7.7*  PROT 5.6* 5.4*  --  4.9*  ALBUMIN 2.7* 2.6*  --  2.4*  AST 40 42*  --  39  ALT 31 33  --  31  ALKPHOS 161* 146*  --  133*  BILITOT 0.4 0.7  --  1.0  GFRNONAA >60 >60 >60 >60  ANIONGAP 12 15 14  18*    Hematology Recent Labs  Lab 11/27/21 1142 11/27/21 1719 11/28/21 0031  WBC 67.0* 59.3* 55.3*  RBC 3.84* 3.87* 3.63*  HGB 11.9* 12.2* 11.3*  HCT 34.0* 34.6* 32.0*  MCV 88.5 89.4 88.2  MCH 31.0 31.5 31.1  MCHC 35.0 35.3 35.3  RDW 14.7 14.6 14.6  PLT 29* 28* 31*   BNP Recent Labs  Lab 11/27/21 2014  BNP 472.9*    DDimer No results for input(s): DDIMER in the last 168 hours.   Radiology  DG Chest 2 View  Result Date: 11/27/2021 CLINICAL DATA:  Suspected sepsis EXAM: CHEST - 2 VIEW COMPARISON:  10/27/2021 FINDINGS: Heart is normal size. Bilateral airspace disease, most pronounced in the right lower lobe and left upper lobe. Small right pleural effusion. No acute bony abnormality. IMPRESSION: Bilateral airspace disease with small right effusion. Findings are concerning for multifocal pneumonia. Electronically Signed   By: Rolm Baptise M.D.   On: 11/27/2021 18:14   MR BRAIN W WO CONTRAST  Result Date: 11/27/2021 CLINICAL DATA:  Brain metastasis. Brain/central nervous system neoplasm; assess for treatment response. EXAM: MRI HEAD WITHOUT AND WITH CONTRAST TECHNIQUE: Multiplanar, multiecho pulse sequences of the brain and surrounding structures were obtained without and with intravenous contrast. CONTRAST:  20mL GADAVIST GADOBUTROL 1 MMOL/ML IV SOLN COMPARISON:  MR head 10/15/2021. FINDINGS: Motion artifact is present. Brain: The largest, likely extra-axial lesion along the right falx has decreased in size measuring approximately 1.2 x 0.6 cm (previously 2.1 x 1.2 cm). Left parasagittal frontal lesion measures approximately 5 mm (series 17, image 102) is slightly smaller (previously 6 mm). The small left parietal  lesion is not definitely seen. Extensive edema in the right frontal lobe has nearly resolved. Edema in the anterior left frontal lobe has resolved. No definite new mass or abnormal enhancement. There are numerous foci of diffusion hyperintensity the cerebral hemispheres involving all but the temporal lobes as well as the cerebellar hemispheres and thalami. No hydrocephalus or extra-axial collection. Vascular: Major vessel flow voids at the skull base are preserved. Skull and upper cervical spine: Normal marrow signal is preserved. Sinuses/Orbits: Paranasal sinuses are aerated. Orbits are unremarkable. Other: Sella is unremarkable.  Mastoid air cells are clear. IMPRESSION: Motion degraded study. Positive treatment response  as detailed above. No definite new metastasis. Numerous small foci of acute to subacute infarcts involving multiple vascular territories suggestive of a central embolic source. These results will be called to the ordering clinician or representative by the Radiologist Assistant, and communication documented in the PACS or [employer]. Electronically Signed   By: Macy Mis M.D.   On: 11/27/2021 13:05   VAS US CAROTID  Result Date: 11/28/2021 Carotid Arterial Duplex Study Patient Name:  JAIMESON GOPAL  Date of Exam:   11/28/2021 Medical Rec #: [id number]          Accession #:    [id number] Date of Birth: Apr 08, 1961          Patient Gender: M Patient Age:   81 years Exam Location:  [facility] Procedure:      VAS US CAROTID Referring Phys: Alferd Patee [facility] --------------------------------------------------------------------------------  Indications:       CVA. Risk Factors:      Past history of smoking, prior MI. Comparison Study:  No prior study Performing Technologist: Maudry Mayhew MHA, RDMS, RVT, RDCS  Examination Guidelines: A complete evaluation includes B-mode imaging, spectral Doppler, color Doppler, and power Doppler as needed of all accessible portions of each  vessel. Bilateral testing is considered an integral part of a complete examination. Limited examinations for reoccurring indications may be performed as noted.  Right Carotid Findings: +----------+--------+-------+--------+----------------------+------------------+           PSV cm/sEDV    StenosisPlaque Description    Comments                             cm/s                                                    +----------+--------+-------+--------+----------------------+------------------+ CCA Prox  103     24                                   intimal thickening +----------+--------+-------+--------+----------------------+------------------+ CCA Distal57      13                                   intimal thickening +----------+--------+-------+--------+----------------------+------------------+ ICA Prox  37      17             smooth and                                                                heterogenous                             +----------+--------+-------+--------+----------------------+------------------+ ICA Distal66      30                                                      +----------+--------+-------+--------+----------------------+------------------+  ECA       125     12             smooth and                                                                heterogenous                             +----------+--------+-------+--------+----------------------+------------------+ +----------+--------+-------+----------------+-------------------+           PSV cm/sEDV cmsDescribe        Arm Pressure (mmHG) +----------+--------+-------+----------------+-------------------+ [id number]             Multiphasic, WNL                    +----------+--------+-------+----------------+-------------------+ +---------+--------+--+--------+-+---------+ VertebralPSV cm/s20EDV cm/s6Antegrade  +---------+--------+--+--------+-+---------+  Left Carotid Findings: +----------+--------+-------+--------+----------------------+------------------+           PSV cm/sEDV    StenosisPlaque Description    Comments                             cm/s                                                    +----------+--------+-------+--------+----------------------+------------------+ CCA Prox  117     23                                   intimal thickening +----------+--------+-------+--------+----------------------+------------------+ CCA Distal58      17                                                      +----------+--------+-------+--------+----------------------+------------------+ ICA Prox  34      11             smooth and                                                                heterogenous                             +----------+--------+-------+--------+----------------------+------------------+ ICA Distal75      34                                                      +----------+--------+-------+--------+----------------------+------------------+ ECA       73      10                                                      +----------+--------+-------+--------+----------------------+------------------+ +----------+--------+--------+----------------+-------------------+  PSV cm/sEDV cm/sDescribe        Arm Pressure (mmHG) +----------+--------+--------+----------------+-------------------+ [id number]             Multiphasic, WNL                    +----------+--------+--------+----------------+-------------------+ +---------+--------+--+--------+--+---------+ VertebralPSV cm/s63EDV cm/s32Antegrade +---------+--------+--+--------+--+---------+   Summary: Right Carotid: Velocities in the right ICA are consistent with a 1-39% stenosis. Left Carotid: Velocities in the left ICA are consistent with a 1-39% stenosis.                Heterogenous area of the left subclavian area noted; etiology               unknown. Vertebrals:  Bilateral vertebral arteries demonstrate antegrade flow. Subclavians: Normal flow hemodynamics were seen in bilateral subclavian              arteries. *See table(s) above for measurements and observations.     Preliminary    VAS [place] LOWER EXTREMITY VENOUS (DVT) (ONLY [facility] & [facility])  Result Date: 11/28/2021  Lower Venous DVT Study Patient Name:  JASMINE MACEACHERN  Date of Exam:   11/28/2021 Medical Rec #: [id number]          Accession #:    [id number] Date of Birth: December 19, 1961          Patient Gender: M Patient Age:   16 years Exam Location:  [facility] Procedure:      VAS [place] LOWER EXTREMITY VENOUS (DVT) Referring Phys: JOSHUA LONG --------------------------------------------------------------------------------  Indications: Edema, and ulceration.  Risk Factors: Cancer Metastatic non-small cell lung cancer. Comparison Study: No prior study Performing Technologist: Maudry Mayhew MHA, RDMS, RVT, RDCS  Examination Guidelines: A complete evaluation includes B-mode imaging, spectral Doppler, color Doppler, and power Doppler as needed of all accessible portions of each vessel. Bilateral testing is considered an integral part of a complete examination. Limited examinations for reoccurring indications may be performed as noted. The reflux portion of the exam is performed with the patient in reverse Trendelenburg.  +---------+---------------+---------+-----------+----------+--------------+ RIGHT    CompressibilityPhasicitySpontaneityPropertiesThrombus Aging +---------+---------------+---------+-----------+----------+--------------+ CFV      Full           Yes      Yes                                 +---------+---------------+---------+-----------+----------+--------------+ SFJ      Full                                                         +---------+---------------+---------+-----------+----------+--------------+ FV Prox  Full                                                        +---------+---------------+---------+-----------+----------+--------------+ FV Mid   Full                                                        +---------+---------------+---------+-----------+----------+--------------+  FV DistalFull                                                        +---------+---------------+---------+-----------+----------+--------------+ PFV      Full                                                        +---------+---------------+---------+-----------+----------+--------------+ POP      Full           Yes      Yes                                 +---------+---------------+---------+-----------+----------+--------------+ PTV      Full                                                        +---------+---------------+---------+-----------+----------+--------------+ PERO     Full                                                        +---------+---------------+---------+-----------+----------+--------------+ Gastroc  None                    No                   Acute          +---------+---------------+---------+-----------+----------+--------------+   +---------+---------------+---------+-----------+----------+--------------+ LEFT     CompressibilityPhasicitySpontaneityPropertiesThrombus Aging +---------+---------------+---------+-----------+----------+--------------+ CFV      Full           Yes      Yes                                 +---------+---------------+---------+-----------+----------+--------------+ SFJ      Full                                                        +---------+---------------+---------+-----------+----------+--------------+ FV Prox  Full                                                         +---------+---------------+---------+-----------+----------+--------------+ FV Mid   Full                                                        +---------+---------------+---------+-----------+----------+--------------+  FV DistalFull                                                        +---------+---------------+---------+-----------+----------+--------------+ PFV      Full                                                        +---------+---------------+---------+-----------+----------+--------------+ POP      Full           Yes      Yes                                 +---------+---------------+---------+-----------+----------+--------------+ PTV      Full                                                        +---------+---------------+---------+-----------+----------+--------------+ PERO     Full                                                        +---------+---------------+---------+-----------+----------+--------------+ Gastroc  None                    No                   Acute          +---------+---------------+---------+-----------+----------+--------------+    Summary: RIGHT: - Findings consistent with acute deep vein thrombosis involving the right gastrocnemius veins. - No cystic structure found in the popliteal fossa.  LEFT: - Findings consistent with acute deep vein thrombosis involving the left gastrocnemius veins. - No cystic structure found in the popliteal fossa.  *See table(s) above for measurements and observations.    Preliminary     Cardiac Studies  Echo pending   Patient Profile  CLEV[place] CORK is a 60 y.o. 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  MRI shows concerns for new stroke.  EKG consistent with inferolateral ST elevation.  Cardiology was consulted for his abnormal EKG.  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  He is also had more confusion per her report.  An MRI was obtained by his oncologist which shows concerns for new strokes.  Chest x-ray is consistent with multifocal pneumonia.  His EKG shows inferior lateral ST elevations.  There are no reciprocal changes.  I did perform a bedside echocardiogram but this was quite difficult due to poor windows.  To me it appears that his apex is hypokinetic, but poorly visualized.  Globally his EF is preserved around 55-60. Possibly this  is a stress-induced cardiomyopathy. -His troponins are minimally elevated for what would be expected for STEMI.  I would like to recheck these. -I think the more likely diagnosis would be stress-induced cardiomyopathy in the setting of multifocal pneumonia and active malignancy.  He also possibly has cellulitis. -I agree marantic endocarditis could be an issue here.  I saw no vegetations on my quick examination.  We will get a formal echo. -He is not a candidate for invasive angiography due to thrombocytopenia.  He was placed on aspirin and heparin overnight.  I think we should carefully reconsider this decision.  From my standpoint I would hold antiplatelet agents and anticoagulation.  I believe he is very high risk for bleeding intracranially.  I discussed this with the critical care medicine team.  If next round of troponins are similar to prior values I would recommend to stop this. -I really do not believe he will be a good candidate for invasive angiography even if he gets through his critical illness.  He does have lung cancer with metastatic disease.  He will require ongoing chemotherapy.  Giving that he will undoubtedly have further drops in platelets along the way he is not a great candidate for invasive angiography and possible percutaneous coronary  intervention.  Any PCI would require uninterrupted dual antiplatelet agents and this would be rather difficult given his ongoing need for chemo.  I did discuss this with the wife. -His IVC is collapsing.  I have stopped Lasix.  If anything this is likely sepsis and he possibly needs fluids. -Withhold beta-blockers given critical illness.  I have ordered Lipitor 80 mg daily.  This may be the best we can do for him. -Cardiology will follow along.  However he is not a great candidate for invasive angiography.  This was discussed with the wife and the critical care medicine team.  #Multifocal pneumonia #Sepsis #Collapsing IVC -Clinically there is no evidence of congestive heart failure.  BNP is likely elevated in the setting of what I suspect is a stress-induced cardiomyopathy.  See discussion above. -On my bedside echo his IVC is collapsing.  I would hold Lasix.  I have discontinued this. -Would recommend with antibiotics and work-up of pneumonia.  I will defer this to critical care medicine.  Procalcitonin is elevated.  He is very high risk for pneumonia.  This possibly explains his overall presentation and his cardiac issues are secondary in the setting of a stress-induced cardiomyopathy.  #CVA -Concerns for embolic strokes. -We will continue antibiotics and obtain blood cultures. -Formal echo is pending. -Not a candidate for TEE with platelets less than 50,000. -This can be readdressed as things move forward.  CRITICAL CARE Performed by: Lake Bells T O'Neal  Total critical care time: 45 minutes. Critical care time was exclusive of separately billable procedures and treating other patients. Critical care was necessary to treat or prevent imminent or life-threatening deterioration. Critical care was time spent personally by me on the following activities: development of treatment plan with patient and/or surrogate as well as nursing, discussions with consultants, evaluation of patient's response to  treatment, examination of patient, obtaining history from patient or surrogate, ordering and performing treatments and interventions, ordering and review of laboratory studies, ordering and review of radiographic studies, pulse oximetry and re-evaluation of patient's condition.    Signed, Addison Naegeli. Audie Box, MD, [place]  11/28/2021 10:18 AM

## 2021-11-28 NOTE — Progress Notes (Signed)
Carotid artery duplex and bilateral lower extremity venous duplex completed. Refer to "CV Proc" under chart review to view preliminary results.  11/28/2021 9:12 AM Kelby Aline., MHA, RVT, RDCS, RDMS

## 2021-11-29 DIAGNOSIS — D696 Thrombocytopenia, unspecified: Secondary | ICD-10-CM

## 2021-11-29 DIAGNOSIS — C3490 Malignant neoplasm of unspecified part of unspecified bronchus or lung: Secondary | ICD-10-CM | POA: Diagnosis not present

## 2021-11-29 DIAGNOSIS — C7931 Secondary malignant neoplasm of brain: Secondary | ICD-10-CM | POA: Diagnosis not present

## 2021-11-29 LAB — CBC
HCT: 29.3 % — ABNORMAL LOW (ref 39.0–52.0)
Hemoglobin: 10.3 g/dL — ABNORMAL LOW (ref 13.0–17.0)
MCH: 31.4 pg (ref 26.0–34.0)
MCHC: 35.2 g/dL (ref 30.0–36.0)
MCV: 89.3 fL (ref 80.0–100.0)
Platelets: 67 10*3/uL — ABNORMAL LOW (ref 150–400)
RBC: 3.28 MIL/uL — ABNORMAL LOW (ref 4.22–5.81)
RDW: 15.1 % (ref 11.5–15.5)
WBC: 42.4 10*3/uL — ABNORMAL HIGH (ref 4.0–10.5)
nRBC: 0.1 % (ref 0.0–0.2)

## 2021-11-29 LAB — BPAM PLATELET PHERESIS
Blood Product Expiration Date: 202212102359
Blood Product Expiration Date: 202212112359
ISSUE DATE / TIME: 202212100115
ISSUE DATE / TIME: 202212100353
Unit Type and Rh: 5100
Unit Type and Rh: 6200

## 2021-11-29 LAB — PREPARE PLATELET PHERESIS
Unit division: 0
Unit division: 0

## 2021-11-29 LAB — CALCIUM, IONIZED: Calcium, Ionized, Serum: 4.4 mg/dL — ABNORMAL LOW (ref 4.5–5.6)

## 2021-11-29 LAB — HEPARIN LEVEL (UNFRACTIONATED): Heparin Unfractionated: <0.1 IU/mL — ABNORMAL LOW (ref 0.30–0.70)

## 2021-11-29 LAB — PROCALCITONIN: Procalcitonin: 1.13 ng/mL

## 2021-11-29 MED ORDER — NICOTINE 7 MG/24HR TD PT24
7.0000 mg | MEDICATED_PATCH | Freq: Every day | TRANSDERMAL | Status: DC
  Administered 2021-11-29 – 2021-12-04 (×6): 7 mg via TRANSDERMAL
  Filled 2021-11-29 (×6): qty 1

## 2021-11-29 MED ORDER — METOPROLOL TARTRATE 12.5 MG HALF TABLET
12.5000 mg | ORAL_TABLET | Freq: Two times a day (BID) | ORAL | Status: DC
  Administered 2021-11-29 – 2021-12-04 (×11): 12.5 mg via ORAL
  Filled 2021-11-29 (×12): qty 1

## 2021-11-29 MED ORDER — SILVER SULFADIAZINE 1 % EX CREA
TOPICAL_CREAM | Freq: Two times a day (BID) | CUTANEOUS | Status: DC
  Filled 2021-11-29 (×2): qty 85

## 2021-11-29 MED ORDER — HEPARIN (PORCINE) 25000 UT/250ML-% IV SOLN
1650.0000 [IU]/h | INTRAVENOUS | Status: AC
  Administered 2021-11-29: 900 [IU]/h via INTRAVENOUS
  Administered 2021-12-01: 12:00:00 1550 [IU]/h via INTRAVENOUS
  Administered 2021-12-02 – 2021-12-03 (×3): 1650 [IU]/h via INTRAVENOUS
  Filled 2021-11-29 (×6): qty 250

## 2021-11-29 NOTE — Progress Notes (Signed)
NAME:  Christopher Randall, MRN:  [id number], DOB:  27-Jun-1961, LOS: 2 ADMISSION DATE:  11/27/2021, CONSULTATION DATE:  94 REFERRING MD:  Dr Rogers Blocker, CHIEF COMPLAINT: Lower extremity edema  History of Present Illness:  Christopher Randall that presented to the emergency room from home today.  He presented with increasing shortness of breath and lower extremity edema with skin breakdown and associated erythema.  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  He had undergone 1 session of chemotherapy as well as radiation.  Over the past week the patient has had increasing confusion and word searching in addition to a ecchymotic rash that started on both ankles.  The swelling in his ankles is also gotten significantly worse over the last week.  He was seen in the oncology clinic today.  Ultimately he was referred to the emergency room for further evaluation of the legs.  Patient was also being worked up for the confusion.  MRI  Christopher demonstrates a embolic CVA.  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  Discussed the case with Dr. Eliberto Ivory and Dr. Gaynelle Arabian.  Patient will be transferred from the medical floor to the intensive care unit for further work-up.  Patient continues to deny any chest pain/chest pressure.  Objective   Blood pressure (!) 136/101, pulse (!) 122,  temperature 98.3 F (36.8 C), temperature source Oral, resp. rate 20, height 5\' 9"  (1.753 m), weight 90.9 kg, SpO2 90 %.        Intake/Output Summary (Last 24 hours) at 11/29/2021 1031 Last data filed at 11/29/2021 0845 Gross per 24 hour  Intake 967.07 ml  Output 1625 ml  Net -657.93 ml   Filed Weights   11/28/21 0500 11/29/21 0500  Weight: 92.4 kg 90.9 kg    General: 60 year old Randall who appears older than stated age 82: No JVD or lymphadenopathy is appreciated Neuro: Still having problems with mentation CV: Sounds are distant PULM: Decreased breath sounds in the bases GI: soft, bsx4 active  GU: Extremities: warm/dry, 2= edema  Skin: no rashes or lesions See below                Assessment & Plan:  Lower extrem cellulitis Wound care is following   Volume overload   Intake/Output Summary (Last 24 hours) at 11/29/2021 1035 Last data filed at 11/29/2021 0845 Gross per 24 hour  Intake 967.07 ml  Output 1625 ml  Net -657.93 ml   Continue gentle diuresis   Small Cell lung CA with mets to the brain Is being followed by oncology   Thrombocytopenia Despite transfusion of platelets platelets remain 28 11/29/2021 platelets are now 78 Continue to monitor platelets  Hyponatremia (hypervolemic hyponatremia) Recent Labs  Lab 11/27/21 1719 11/27/21 1955 11/28/21 0031  NA 119* 120* 121*  Monitor No need for intervention at Christopher time  Deep vein thrombosis of left gastrocnemius vein Currently on heparin drip   Leukocytosis Continue antimicrobial therapy currently on heparin drip continue to  monitor no interventions needed    CVA-embolic neurology is following   STEMI Heparin therapy restarted continue anticoagulation as long as platelets are greater than 50 Questionable restarting aspirin     Best Practice (right click and "Reselect all SmartList Selections" daily)   Diet/type: Regular consistency (see orders) DVT prophylaxis:  systemic heparin GI prophylaxis: PPI Lines: N/A Foley:  N/A Code Status:  full code Last date of multidisciplinary goals of care discussion [tbd]  11/28/2021 patient and family updated at bedside 11/29/2021 patient and family updated  Labs   CBC: Recent Labs  Lab 11/24/21 1535 11/27/21 1142 11/27/21 1719 11/28/21 0031 11/28/21 1016 11/28/21 1447 11/29/21 0307  WBC 52.5* 67.0* 59.3* 55.3* 43.9* 39.7* 42.4*  NEUTROABS 39.9* 53.2* 54.0*  --  33.7*  --   --   HGB 12.2* 11.9* 12.2* 11.3* 10.8* 10.2* 10.3*  HCT 35.1* 34.0* 34.6* 32.0* 30.2* 28.7* 29.3*  MCV 88.9 88.5 89.4 88.2 88.3 88.6 89.3  PLT 34* 29* 28* 31* 87* 79* 67*    Basic Metabolic Panel: Recent Labs  Lab 11/24/21 1535 11/27/21 1142 11/27/21 1719 11/27/21 1955 11/28/21 0031  NA 127* 120* 119* 120* 121*  K 4.3 4.0 4.0 3.9 3.7  CL 89* 83* 81* 83* 82*  CO2 23 25 23 23  21*  GLUCOSE 101* 93 93 88 90  BUN 20 18 17 16 17   CREATININE 0.82 0.82 0.89 0.88 1.02  CALCIUM 8.2* 8.0* 8.1* 7.9* 7.7*   GFR: Estimated Creatinine Clearance: 86.9 mL/min (by C-G formula based on SCr of 1.02 mg/dL). Recent Labs  Lab 11/27/21 1719 11/27/21 1955 11/27/21 2031 11/28/21 0031 11/28/21 1016 11/28/21 1447 11/29/21 0307  PROCALCITON  --  0.80  --  1.05  --   --  1.13  WBC 59.3*  --   --  55.3* 43.9* 39.7* 42.4*  LATICACIDVEN 1.9  --  1.7  --   --   --   --     Liver Function Tests: Recent Labs  Lab 11/24/21 1535 11/27/21 1142 11/27/21 1719 11/28/21 0031  AST 38 40 42* 39  ALT 38 31 33 31  ALKPHOS 87 161* 146* 133*  BILITOT 0.6 0.4 0.7 1.0  PROT 5.8* 5.6* 5.4* 4.9*  ALBUMIN 2.6* 2.7* 2.6* 2.4*   No results for input(s): LIPASE, AMYLASE in the last 168 hours. Recent Labs  Lab 11/27/21 2247  AMMONIA 18    ABG No results found for: PHART, PCO2ART, PO2ART, HCO3, TCO2, ACIDBASEDEF, O2SAT   Coagulation Profile: Recent Labs  Lab 11/27/21 1719 11/28/21 0031  INR 1.6* 1.5*    Cardiac Enzymes: No results for  input(s): CKTOTAL, CKMB, CKMBINDEX, TROPONINI in the last 168 hours.  HbA1C: Hgb A1c MFr Bld  Date/Time Value Ref Range Status  11/28/2021 12:31 AM 5.8 (H) 4.8 - 5.6 % Final    Comment:    (NOTE) Pre diabetes:          5.7%-6.4%  Diabetes:              >6.4%  Glycemic control for   <7.0% adults with diabetes   10/07/2021 04:25 AM 5.5 4.8 - 5.6 % Final    Comment:    (NOTE) Pre diabetes:          5.7%-6.4%  Diabetes:              >6.4%  Glycemic control for   <7.0% adults with diabetes     CBG: No results for input(s): GLUCAP in the last 168  hours.    Critical care time: 45 min     Richardson Landry Yarissa Reining ACNP Acute Care Nurse Practitioner [place] Please consult Amion 11/29/2021, 10:31 AM   11/29/2021, 10:31 AM

## 2021-11-29 NOTE — Progress Notes (Signed)
ANTICOAGULATION CONSULT NOTE  Pharmacy Consult for Heparin Indication: VTE treatment  Allergies  Allergen Reactions   Iopamidol Hives and Itching    Isovue break through with prep, Hives and Itching  Patient had PET?CT scan with 125 mls of Isovue-300. Noted to have two raised itchy hives which self-resolved. Patient had PET/CT scan with contrast on 05/27/2014 while on prednisone prep (3 doses of 50 mg-13 hour prep) and noted to have two hives which self -resolved without treatment   Iodinated Diagnostic Agents Rash   Iodine-131 Rash    Patient Measurements: Height: 5\' 9"  (175.3 cm) Weight: 90.9 kg (200 lb 6.4 oz) IBW/kg (Calculated) : 70.7 Heparin Dosing Weight: 90 kg  Vital Signs: Temp: 98.3 F (36.8 C) (12/11 0800) Temp Source: Oral (12/11 0800) BP: 145/91 (12/11 0800) Pulse Rate: 118 (12/11 0800)  Labs: Recent Labs    11/27/21 1719 11/27/21 1927 11/27/21 1955 11/27/21 2247 11/28/21 0031 11/28/21 1016 11/28/21 1100 11/28/21 1447 11/29/21 0307  HGB 12.2*  --   --   --  11.3* 10.8*  --  10.2* 10.3*  HCT 34.6*  --   --   --  32.0* 30.2*  --  28.7* 29.3*  PLT 28*  --   --   --  31* 87*  --  79* 67*  LABPROT 19.0*  --   --   --  18.1*  --   --   --   --   INR 1.6*  --   --   --  1.5*  --   --   --   --   CREATININE 0.89  --  0.88  --  1.02  --   --   --   --   TROPONINIHS  --    < >  --  2,425*  --   --  3,474* 6,802*  --    < > = values in this interval not displayed.     Estimated Creatinine Clearance: 86.9 mL/min (by C-G formula based on SCr of 1.02 mg/dL).   Medical History: Past Medical History:  Diagnosis Date   Abnormal TSH 09/28/2017   Anemia 11/27/2021   Anxiety 07/03/2012   Depression    Difficulty sleeping 09/28/2017   Examination of participant in clinical trial 09/11/2012   History of immunotherapy 02/23/2016   Melanoma of skin 10/26/2011   Nicotine dependence 07/17/2015   Smoker    STEMI (ST elevation myocardial infarction) ([place]) 11/27/2021     Medications:  Scheduled:    stroke: mapping our early stages of recovery book   Does not apply Once   sodium chloride   Intravenous Once   atorvastatin  40 mg Oral Daily   Chlorhexidine Gluconate Cloth  6 each Topical Daily   dexamethasone  1 mg Oral Q breakfast   fluconazole  100 mg Oral Daily   folic acid  1 mg Oral Daily   pantoprazole  40 mg Oral Daily   vitamin B-12  1,000 mcg Oral Daily   Infusions:   sodium chloride Stopped (11/29/21 0644)   ceFEPime (MAXIPIME) IV Stopped (11/29/21 2595)   vancomycin Stopped (11/29/21 0542)    Assessment: 60 y.o. M presents with dyspnea and lower extremity weeping. Pt with lung cancer and known brain mets. Pt with b/l embolic strokes on MRI - neurology following. Also with possible STEMI (but Cardiology feels may be more stress-related cardiomyopathy) - unable to go to emergent [facility] given severe thrombocytopenia so plan to treat medically for time being  with heparin and ASA. LE dopplers also resulted positive for bilateral acute DVT. Pharmacy consulted for heparin dosing after platelets given 12/10. CCM, neurology, and cardiology aware of bleeding risks. Both heparin drip and asa were held 12/10 with plt drop to 28 despite transfusion.  Heparin drip to be resumed this AM per CCM for VTE treatment. Plt improved to 67 this morning s/p transfusion (peak 87 yesterday). Hg low but stable. No bleed issues reported.  Goal of Therapy:  Heparin level 0.3-0.5 units/ml Monitor platelets by anticoagulation protocol: Yes   Plan:  Resume heparin drip at previous rate 900 units/hr. No bolus with severe thrombocytopenia and recent CVA. Will target lower goal F/u heparin level in 6 hours Monitor daily CBC, f/u platelets closely and for any s/s bleeding   Arturo Morton, PharmD, BCPS Please check AMION for all [place] contact numbers Clinical Pharmacist 11/29/2021 9:55 AM

## 2021-11-29 NOTE — Evaluation (Signed)
Occupational Therapy Evaluation Patient Details Name: Christopher Randall MRN: [id number] DOB: Jan 27, 1961 Today's Date: 11/29/2021   History of Present Illness Pt is 60 yo male who presents with BLE swelling, confusion, and weakness. Pt with STEMI and MRI showed multiple embolic acute and subacute infarcts involving multiple vascular territories suggestive of central embolic source. Pt also with LE cellulitis.  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 Chemo x1 wk s/p radiosurgery of brain.  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 Pt also with B DVT. Other PMH: melanoma with brain mets and radiosurgery, anxiety/ depression.   Clinical Impression   Pt PTA: Pt independent with ADL and mobility. Pt currently, set-upA to maxA for ADL tasks. Pt limited by decreased cognition, decreased ability to care for self and decreased activity tolerance. Pt's spouse in room to assist and very supportive. Pt supervisionA for bed mobility, minA for sit to stand +2 assist incase for safety. BLEs weeping and in severe pain with mobility. Careful not to touch the bottom and posterior side of BLEs as they are extremely sensitive.  Pt would greatly benefit from continued OT skilled services. OT following acutely.     Recommendations for follow up therapy are one component of a multi-disciplinary discharge planning process, led by the attending physician.  Recommendations may be updated based on patient status, additional functional criteria and insurance authorization.   Follow Up Recommendations  Acute inpatient rehab (3hours/day)    Assistance Recommended at Discharge Frequent or constant Supervision/Assistance  Functional Status Assessment  Patient has had a recent decline in their functional status and demonstrates the ability to make significant improvements in function in a reasonable and predictable amount of time.  Equipment Recommendations  BSC/3in1;Tub/shower seat     Recommendations for Other Services Rehab consult     Precautions / Restrictions Precautions Precautions: Fall Restrictions Weight Bearing Restrictions: No Other Position/Activity Restrictions: SBP <180, B DVT      Mobility Bed Mobility Overal bed mobility: Modified Independent             General bed mobility comments: pt able to roll, bridge, and push self up in bed    Transfers Overall transfer level: Needs assistance Equipment used: Rolling walker (2 wheels);2 person hand held assist Transfers: Sit to/from Stand Sit to Stand: Min assist;+2 physical assistance;+2 safety/equipment;From elevated surface           General transfer comment: Bed brought up 1-2 inches. Pt minA for power up and stabilizing with RW in front; pt taking steps to recliner with  minA overall.      Balance Overall balance assessment: Needs assistance Sitting-balance support: Feet unsupported;No upper extremity supported Sitting balance-Leahy Scale: Good     Standing balance support: Bilateral upper extremity supported;Reliant on assistive device for balance;During functional activity Standing balance-Leahy Scale: Poor Standing balance comment: required external assist to maintain standing                           ADL either performed or assessed with clinical judgement   ADL Overall ADL's : Needs assistance/impaired Eating/Feeding: Set up;Sitting   Grooming: Minimal assistance;Sitting   Upper Body Bathing: Minimal assistance;Sitting   Lower Body Bathing: Maximal assistance;Cueing for safety;Sitting/lateral leans   Upper Body Dressing : Minimal assistance;Sitting   Lower Body Dressing: Maximal assistance;Sitting/lateral leans;Sit to/from stand   Toilet Transfer: Minimal assistance;Cueing for safety;Stand-pivot;Rolling walker (2 wheels)   Toileting- Clothing Manipulation and Hygiene: Maximal assistance;Sitting/lateral lean;Sit to/from  stand;Cueing for safety;Cueing for  sequencing       Functional mobility during ADLs: Minimal assistance;Rolling walker (2 wheels);+2 for physical assistance;+2 for safety/equipment;Cueing for safety;Cueing for sequencing General ADL Comments: Pt limited by decreased cognition, decreased ability to care for self and decreased activity tolerance. Pt's spouse in room to assist.     Vision Baseline Vision/History: 0 No visual deficits Ability to See in Adequate Light: 0 Adequate Patient Visual Report: No change from baseline Vision Assessment?: No apparent visual deficits     Perception     Praxis      Pertinent Vitals/Pain Pain Assessment: 0-10 Pain Score: 5  Facial Expression: Tense Body Movements: Protection Muscle Tension: Tense, rigid Compliance with ventilator (intubated pts.): N/A Vocalization (extubated pts.): N/A CPOT Total: 3 Pain Location: B feet and LE's Pain Descriptors / Indicators: Sore Pain Intervention(s): Monitored during session;Limited activity within patient's tolerance;Repositioned;Premedicated before session     Hand Dominance Right   Extremity/Trunk Assessment Upper Extremity Assessment Upper Extremity Assessment: Generalized weakness   Lower Extremity Assessment Lower Extremity Assessment: Generalized weakness;Defer to PT evaluation LLE Deficits / Details: weeping, discoloration, swelling ankles and feet L>R LLE Sensation:  (hypersensitive) LLE Coordination: decreased gross motor   Cervical / Trunk Assessment Cervical / Trunk Assessment: Normal   Communication Communication Communication: Other (comment) (recent voice changes per wife)   Cognition Arousal/Alertness: Awake/alert Behavior During Therapy: Restless Overall Cognitive Status: Impaired/Different from baseline Area of Impairment: Attention;Memory;Following commands;Safety/judgement;Awareness;Problem solving                   Current Attention Level: Sustained Memory: Decreased short-term memory Following  Commands: Follows one step commands inconsistently Safety/Judgement: Decreased awareness of safety;Decreased awareness of deficits Awareness: Emergent Problem Solving: Difficulty sequencing;Requires verbal cues General Comments: Pt wanting clarification of sequence of steps for sit to stand and transfer to recliner. Pt asked OT to slow down and repeat directions. Pt not always comprehending simple commands.     General Comments  O2>90%; HR <120 BPM with exertion; BP: 180/77 after exertion.    Exercises     Shoulder Instructions      Home Living Family/patient expects to be discharged to:: Private residence Living Arrangements: Spouse/significant other Available Help at Discharge: Family;Available 24 hours/day Type of Home: House Home Access: Level entry     Home Layout: Two level;Able to live on main level with bedroom/bathroom     Bathroom Shower/Tub: Occupational psychologist: Handicapped height Bathroom Accessibility: Yes   Home Equipment: [place] - quad;Shower seat   Additional Comments: wife has taken FMLA to be with him. Children and other family live nearby as well      Prior Functioning/Environment Prior Level of Function : Independent/Modified Independent;Driving             Mobility Comments: independent until past couple of days. Just retired. Likes to mountain bike, walks 5 mi/ day, goes to gym 5 days/wk but is a smoker ADLs Comments: independent        OT Problem List: Decreased strength;Decreased activity tolerance;Impaired balance (sitting and/or standing);Decreased coordination;Decreased cognition;Decreased safety awareness;Pain;Impaired UE functional use;Increased edema      OT Treatment/Interventions: Self-care/ADL training;Therapeutic exercise;Energy conservation;Therapeutic activities;Cognitive remediation/compensation;Patient/family education;Balance training    OT Goals(Current goals can be found in the care plan section) Acute Rehab OT  Goals Patient Stated Goal: to go to rehab for strengthening OT Goal Formulation: With patient/family Time For Goal Achievement: 12/13/21 Potential to Achieve Goals: Good ADL Goals Pt Will Transfer to  Toilet: with min guard assist;bedside commode Additional ADL Goal #1: Pt will perform OOB ADL tasks x5 mins with modA overall with intermittent seated rest breaks. Additional ADL Goal #2: Pt will increase to x2 mins of standing with minguardA for OOB ADL tasks and pain <3/10.  OT Frequency: Min 2X/week   Barriers to D/C:            Co-evaluation              AM-PAC OT "6 Clicks" Daily Activity     Outcome Measure Help from another person eating meals?: A Little Help from another person taking care of personal grooming?: A Little Help from another person toileting, which includes using toliet, bedpan, or urinal?: A Lot Help from another person bathing (including washing, rinsing, drying)?: Total Help from another person to put on and taking off regular upper body clothing?: A Little Help from another person to put on and taking off regular lower body clothing?: A Lot 6 Click Score: 14   End of Session Equipment Utilized During Treatment: Gait belt;Rolling walker (2 wheels) Nurse Communication: Mobility status;Other (comment) (posey belt)  Activity Tolerance: Patient limited by pain Patient left: in chair;with call bell/phone within reach;with family/visitor present;with chair alarm set;Other (comment) (posey belt placed (no chair alarms available))  OT Visit Diagnosis: Unsteadiness on feet (R26.81);Muscle weakness (generalized) (M62.81);Pain;Other symptoms and signs involving cognitive function                Time: 1135-1200 OT Time Calculation (min): 25 min Charges:  OT General Charges $OT Visit: 1 Visit OT Evaluation $OT Eval Moderate Complexity: 1 Mod OT Treatments $Neuromuscular Re-education: 8-22 mins  Jefferey Pica, OTR/L Acute Rehabilitation Services Pager:  [phone] Office: [place] 11/29/2021, 2:29 PM

## 2021-11-29 NOTE — Progress Notes (Signed)
STROKE TEAM PROGRESS NOTE   SUBJECTIVE (INTERVAL HISTORY) His wife is at the bedside.  Pt lying in bed, sleepiness but cooperative with exam and no focal deficit. Platelet improved with platelet transfusion. Will resume heparin IV per CCM. Cardiology also recommend plavix since platelet has improved, which is fine with neuro.    OBJECTIVE Temp:  [97.9 F (36.6 C)-98.4 F (36.9 C)] 98.4 F (36.9 C) (12/11 1200) Pulse Rate:  [107-126] 116 (12/11 1400) Cardiac Rhythm: Sinus tachycardia (12/11 0400) Resp:  [13-31] 21 (12/11 1400) BP: (115-164)/(61-110) 125/90 (12/11 1400) SpO2:  [87 %-96 %] 94 % (12/11 1400) Weight:  [90.9 kg] 90.9 kg (12/11 0500)  No results for input(s): GLUCAP in the last 168 hours. Recent Labs  Lab 11/24/21 1535 11/27/21 1142 11/27/21 1719 11/27/21 1955 11/28/21 0031  NA 127* 120* 119* 120* 121*  K 4.3 4.0 4.0 3.9 3.7  CL 89* 83* 81* 83* 82*  CO2 _0 21*  GLUCOSE 101* 93 93 88 90  BUN _1 CREATININE 0.82 0.82 0.89 0.88 1.02  CALCIUM 8.2* 8.0* 8.1* 7.9* 7.7*   Recent Labs  Lab 11/24/21 1535 11/27/21 1142 11/27/21 1719 11/28/21 0031  AST 38 40 42* 39  ALT 38 31 33 31  ALKPHOS 87 161* 146* 133*  BILITOT 0.6 0.4 0.7 1.0  PROT 5.8* 5.6* 5.4* 4.9*  ALBUMIN 2.6* 2.7* 2.6* 2.4*   Recent Labs  Lab 11/24/21 1535 11/27/21 1142 11/27/21 1719 11/28/21 0031 11/28/21 1016 11/28/21 1447 11/29/21 0307  WBC 52.5* 67.0* 59.3* 55.3* 43.9* 39.7* 42.4*  NEUTROABS 39.9* 53.2* 54.0*  --  33.7*  --   --   HGB 12.2* 11.9* 12.2* 11.3* 10.8* 10.2* 10.3*  HCT 35.1* 34.0* 34.6* 32.0* 30.2* 28.7* 29.3*  MCV 88.9 88.5 89.4 88.2 88.3 88.6 89.3  PLT 34* 29* 28* 31* 87* 79* 67*   No results for input(s): CKTOTAL, CKMB, CKMBINDEX, TROPONINI in the last 168 hours. Recent Labs    11/27/21 1719 11/28/21 0031  LABPROT 19.0* 18.1*  INR 1.6* 1.5*   No results for input(s): COLORURINE, LABSPEC, PHURINE, GLUCOSEU, HGBUR, BILIRUBINUR, KETONESUR,  PROTEINUR, UROBILINOGEN, NITRITE, LEUKOCYTESUR in the last 72 hours.  Invalid input(s): APPERANCEUR     Component Value Date/Time   CHOL 173 11/28/2021 0031   TRIG 172 (H) 11/28/2021 0031   HDL 20 (L) 11/28/2021 0031   CHOLHDL 8.7 11/28/2021 0031   VLDL 34 11/28/2021 0031   LDLCALC 119 (H) 11/28/2021 0031   Lab Results  Component Value Date   HGBA1C 5.8 (H) 11/28/2021   No results found for: LABOPIA, COCAINSCRNUR, LABBENZ, AMPHETMU, THCU, LABBARB  No results for input(s): ETH in the last 168 hours.  I have personally reviewed the radiological images below and agree with the radiology interpretations.  DG Chest 2 View  Result Date: 11/27/2021 CLINICAL DATA:  Suspected sepsis EXAM: CHEST - 2 VIEW COMPARISON:  10/27/2021 FINDINGS: Heart is normal size. Bilateral airspace disease, most pronounced in the right lower lobe and left upper lobe. Small right pleural effusion. No acute bony abnormality. IMPRESSION: Bilateral airspace disease with small right effusion. Findings are concerning for multifocal pneumonia. Electronically Signed   By: Rolm Baptise M.D.   On: 11/27/2021 18:14   MR ANGIO HEAD WO CONTRAST  Result Date: 11/28/2021 CLINICAL DATA:  Metastatic disease to brain.  Acute stroke. EXAM: MRA HEAD WITHOUT CONTRAST TECHNIQUE: Angiographic images of the Circle of Willis were acquired using MRA technique without intravenous contrast.  COMPARISON:  MRI head 11/27/2021 FINDINGS: Anterior circulation: Internal carotid artery patent bilaterally. Moderate stenosis right A2 segment. Left anterior cerebral artery widely patent. Middle cerebral artery widely patent bilaterally. Posterior circulation: Left vertebral artery is widely patent supplies the basilar. Left PICA patent. Distal right vertebral artery not visualized and may be occluded. This appears to be a small vessel on the MRI. Basilar widely patent. Posterior cerebral arteries patent without large vessel occlusion. Fetal origin right  posterior cerebral artery. Anatomic variants: None Other: Negative for cerebral aneurysm. IMPRESSION: Moderate stenosis right anterior cerebral artery. Left anterior cerebral artery widely patent. Both middle cerebral arteries patent Loss of flow related signal distal right vertebral artery. This may be occluded. Electronically Signed   By: Franchot Gallo M.D.   On: 11/28/2021 13:35   MR BRAIN W WO CONTRAST  Result Date: 11/27/2021 CLINICAL DATA:  Brain metastasis. Brain/central nervous system neoplasm; assess for treatment response. EXAM: MRI HEAD WITHOUT AND WITH CONTRAST TECHNIQUE: Multiplanar, multiecho pulse sequences of the brain and surrounding structures were obtained without and with intravenous contrast. CONTRAST:  77m GADAVIST GADOBUTROL 1 MMOL/ML IV SOLN COMPARISON:  MR head 10/15/2021. FINDINGS: Motion artifact is present. Brain: The largest, likely extra-axial lesion along the right falx has decreased in size measuring approximately 1.2 x 0.6 cm (previously 2.1 x 1.2 cm). Left parasagittal frontal lesion measures approximately 5 mm (series 17, image 102) is slightly smaller (previously 6 mm). The small left parietal lesion is not definitely seen. Extensive edema in the right frontal lobe has nearly resolved. Edema in the anterior left frontal lobe has resolved. No definite new mass or abnormal enhancement. There are numerous foci of diffusion hyperintensity the cerebral hemispheres involving all but the temporal lobes as well as the cerebellar hemispheres and thalami. No hydrocephalus or extra-axial collection. Vascular: Major vessel flow voids at the skull base are preserved. Skull and upper cervical spine: Normal marrow signal is preserved. Sinuses/Orbits: Paranasal sinuses are aerated. Orbits are unremarkable. Other: Sella is unremarkable.  Mastoid air cells are clear. IMPRESSION: Motion degraded study. Positive treatment response as detailed above. No definite new metastasis. Numerous small  foci of acute to subacute infarcts involving multiple vascular territories suggestive of a central embolic source. These results will be called to the ordering clinician or representative by the Radiologist Assistant, and communication documented in the PACS or [employer] Electronically Signed   By: PMacy MisM.D.   On: 11/27/2021 13:05   CT ELBOW LEFT W CONTRAST  Result Date: 11/28/2021 CLINICAL DATA:  Soft tissue infection suspected, upper arm, xray done EXAM: CT OF THE UPPER LEFT EXTREMITY WITH CONTRAST TECHNIQUE: Multidetector CT imaging of the upper left extremity was performed according to the standard protocol following intravenous contrast administration. CONTRAST:  1574mOMNIPAQUE IOHEXOL 300 MG/ML  SOLN COMPARISON:  None. FINDINGS: Technical note: Examination is limited by beam hardening artifact related to patient's positioning of the elbow adjacent to the abdominal wall. Elbow is in flexion resulting in nonstandard imaging planes. There is also slight motion degradation affecting all series. Bones/Joint/Cartilage No acute fracture. No dislocation. No significant elbow joint effusion is seen. No bony destruction or periosteal elevation. Ligaments Suboptimally assessed by CT. Muscles and Tendons Musculotendinous structures are poorly evaluated. No obvious intramuscular fluid collection. Soft tissues Fluid collection overlying the olecranon measuring approximately 3.3 x 1.2 x 3.0 cm with peripheral enhancement (series 8, image 119; series 11, image 71) may represent abscess or bursitis. There is prominent soft tissue edema and ill-defined fluid  at the elbow and proximal to mid forearm. No soft tissue gas. IMPRESSION: 1. Limited exam. 2. Fluid collection overlying the olecranon measuring approximately 3.3 x 1.2 x 3.0 cm with peripheral enhancement may represent abscess or bursitis. 3. Prominent soft tissue edema and ill-defined fluid at the elbow and proximal to mid forearm suggestive of  cellulitis. No soft tissue gas. 4. No acute osseous abnormality. 5. No appreciable elbow joint effusion to suggest septic arthritis. Electronically Signed   By: Davina Poke D.O.   On: 11/28/2021 19:46   ECHOCARDIOGRAM COMPLETE BUBBLE STUDY  Result Date: 11/28/2021    ECHOCARDIOGRAM REPORT   Patient Name:   Christopher Randall Date of Exam: 11/28/2021 Medical Rec #:  [id number]         Height:       69.0 in Accession #:    [id number]        Weight:       203.7 lb Date of Birth:  07-27-1961         BSA:          2.082 m Patient Age:    82 years          BP:           137/84 mmHg Patient Gender: M                 HR:           114 bpm. Exam Location:  Inpatient Procedure: 2D Echo and Saline Contrast Bubble Study Indications:     stroke  History:         Patient has no prior history of Echocardiogram examinations.                  Lung cancer. sepsis.  Sonographer:     Johny Chess RDCS Referring Phys:  [id number] [place] Diagnosing Phys: Jenkins Rouge MD  Sonographer Comments: Image acquisition challenging due to respiratory motion. IMPRESSIONS  1. Patient to return for more images to assess RWMAls and apex with definity . Left ventricular ejection fraction, by estimation, is 55%. The left ventricle has normal function. The left ventricle has no regional wall motion abnormalities. Left ventricular diastolic parameters were normal.  2. Right ventricular systolic function is normal. The right ventricular size is normal.  3. No evidence of tamponade . Small to moderate. The pericardial effusion is posterior to the left ventricle and anterior to the right ventricle. There is no evidence of cardiac tamponade.  4. The mitral valve is grossly normal. No evidence of mitral valve regurgitation. No evidence of mitral stenosis.  5. The aortic valve is tricuspid. Aortic valve regurgitation is not visualized. No aortic stenosis is present.  6. The inferior vena cava is normal in size with greater than 50%  respiratory variability, suggesting right atrial pressure of 3 mmHg.  7. Agitated saline contrast bubble study was negative, with no evidence of any interatrial shunt. FINDINGS  Left Ventricle: Patient to return for more images to assess RWMAls and apex with definity. Left ventricular ejection fraction, by estimation, is 55%. The left ventricle has normal function. The left ventricle has no regional wall motion abnormalities. The left ventricular internal cavity size was normal in size. There is no left ventricular hypertrophy. Left ventricular diastolic parameters were normal. Right Ventricle: The right ventricular size is normal. No increase in right ventricular wall thickness. Right ventricular systolic function is normal. Left Atrium: Left atrial size was normal in size. Right Atrium: Right atrial  size was normal in size. Pericardium: No evidence of tamponade. Small to moderate. The pericardial effusion is posterior to the left ventricle and anterior to the right ventricle. There is no evidence of cardiac tamponade. Presence of epicardial fat layer. Mitral Valve: The mitral valve is grossly normal. No evidence of mitral valve regurgitation. No evidence of mitral valve stenosis. Tricuspid Valve: The tricuspid valve is grossly normal. Tricuspid valve regurgitation is mild . No evidence of tricuspid stenosis. Aortic Valve: The aortic valve is tricuspid. Aortic valve regurgitation is not visualized. No aortic stenosis is present. Pulmonic Valve: The pulmonic valve was grossly normal. Pulmonic valve regurgitation is not visualized. No evidence of pulmonic stenosis. Aorta: The aortic root and ascending aorta are structurally normal, with no evidence of dilitation. Venous: The inferior vena cava is normal in size with greater than 50% respiratory variability, suggesting right atrial pressure of 3 mmHg. IAS/Shunts: The atrial septum is grossly normal. Agitated saline contrast was given intravenously to evaluate for  intracardiac shunting. Agitated saline contrast bubble study was negative, with no evidence of any interatrial shunt.  LEFT VENTRICLE PLAX 2D LVIDd:         4.10 cm LVIDs:         2.60 cm LV PW:         1.10 cm LV IVS:        1.00 cm LVOT diam:     2.10 cm LVOT Area:     3.46 cm  RIGHT VENTRICLE         IVC TAPSE (M-mode): 2.7 cm  IVC diam: 2.00 cm LEFT ATRIUM             Index        RIGHT ATRIUM           Index LA diam:        3.40 cm 1.63 cm/m   RA Area:     16.90 cm LA Vol (A2C):   26.3 ml 12.63 ml/m  RA Volume:   39.50 ml  18.97 ml/m LA Vol (A4C):   32.4 ml 15.58 ml/m LA Biplane Vol: 29.2 ml 14.02 ml/m   AORTA Ao Root diam: 3.70 cm Ao Asc diam:  3.60 cm  SHUNTS Systemic Diam: 2.10 cm Jenkins Rouge MD Electronically signed by Jenkins Rouge MD Signature Date/Time: 11/28/2021/1:26:38 PM    Final (Updated)    VAS US CAROTID  Result Date: 11/28/2021 Carotid Arterial Duplex Study Patient Name:  Christopher Randall  Date of Exam:   11/28/2021 Medical Rec #: [id number]          Accession #:    [id number] Date of Birth: [birth year]          Patient Gender: M Patient Age:   72 years Exam Location:  [facility] [place] Procedure:      VAS US CAROTID Referring Phys: Alferd Patee [facility] --------------------------------------------------------------------------------  Indications:       CVA. Risk Factors:      Past history of smoking, prior MI. Comparison Study:  No prior study Performing Technologist: Maudry Mayhew MHA, RDMS, RVT, RDCS  Examination Guidelines: A complete evaluation includes B-mode imaging, spectral Doppler, color Doppler, and power Doppler as needed of all accessible portions of each vessel. Bilateral testing is considered an integral part of a complete examination. Limited examinations for reoccurring indications may be performed as noted.  Right Carotid Findings: +----------+--------+-------+--------+----------------------+------------------+           PSV cm/sEDV    StenosisPlaque  Description  Comments                             cm/s                                                    +----------+--------+-------+--------+----------------------+------------------+ CCA Prox  103     24                                   intimal thickening +----------+--------+-------+--------+----------------------+------------------+ CCA Distal57      13                                   intimal thickening +----------+--------+-------+--------+----------------------+------------------+ ICA Prox  37      17             smooth and                                                                heterogenous                             +----------+--------+-------+--------+----------------------+------------------+ ICA Distal66      30                                                      +----------+--------+-------+--------+----------------------+------------------+ ECA       125     12             smooth and                                                                heterogenous                             +----------+--------+-------+--------+----------------------+------------------+ +----------+--------+-------+----------------+-------------------+           PSV cm/sEDV cmsDescribe        Arm Pressure (mmHG) +----------+--------+-------+----------------+-------------------+ [id number]             Multiphasic, WNL                    +----------+--------+-------+----------------+-------------------+ +---------+--------+--+--------+-+---------+ VertebralPSV cm/s20EDV cm/s6Antegrade +---------+--------+--+--------+-+---------+  Left Carotid Findings: +----------+--------+-------+--------+----------------------+------------------+           PSV cm/sEDV    StenosisPlaque Description    Comments                             cm/s                                                     +----------+--------+-------+--------+----------------------+------------------+  CCA Prox  117     23                                   intimal thickening +----------+--------+-------+--------+----------------------+------------------+ CCA Distal58      17                                                      +----------+--------+-------+--------+----------------------+------------------+ ICA Prox  34      11             smooth and                                                                heterogenous                             +----------+--------+-------+--------+----------------------+------------------+ ICA Distal75      34                                                      +----------+--------+-------+--------+----------------------+------------------+ ECA       73      10                                                      +----------+--------+-------+--------+----------------------+------------------+ +----------+--------+--------+----------------+-------------------+           PSV cm/sEDV cm/sDescribe        Arm Pressure (mmHG) +----------+--------+--------+----------------+-------------------+ [id number]             Multiphasic, WNL                    +----------+--------+--------+----------------+-------------------+ +---------+--------+--+--------+--+---------+ VertebralPSV cm/s63EDV cm/s32Antegrade +---------+--------+--+--------+--+---------+   Summary: Right Carotid: Velocities in the right ICA are consistent with a 1-39% stenosis. Left Carotid: Velocities in the left ICA are consistent with a 1-39% stenosis.               Heterogenous area of the left subclavian area noted; etiology               unknown. Vertebrals:  Bilateral vertebral arteries demonstrate antegrade flow. Subclavians: Normal flow hemodynamics were seen in bilateral subclavian              arteries. *See table(s) above for measurements and observations.      Preliminary    VAS [place] LOWER EXTREMITY VENOUS (DVT) (ONLY [facility] & [facility])  Result Date: 11/28/2021  Lower Venous DVT Study Patient Name:  Christopher Randall  Date of Exam:   11/28/2021 Medical Rec #: [id number]          Accession #:    [id number] Date of Birth: 02-Oct-1961          Patient Gender:  M Patient Age:   49 years Exam Location:  [facility] Procedure:      VAS [place] LOWER EXTREMITY VENOUS (DVT) Referring Phys: JOSHUA LONG --------------------------------------------------------------------------------  Indications: Edema, and ulceration.  Risk Factors: Cancer Metastatic non-small cell lung cancer. Comparison Study: No prior study Performing Technologist: Maudry Mayhew MHA, RDMS, RVT, RDCS  Examination Guidelines: A complete evaluation includes B-mode imaging, spectral Doppler, color Doppler, and power Doppler as needed of all accessible portions of each vessel. Bilateral testing is considered an integral part of a complete examination. Limited examinations for reoccurring indications may be performed as noted. The reflux portion of the exam is performed with the patient in reverse Trendelenburg.  +---------+---------------+---------+-----------+----------+--------------+ RIGHT    CompressibilityPhasicitySpontaneityPropertiesThrombus Aging +---------+---------------+---------+-----------+----------+--------------+ CFV      Full           Yes      Yes                                 +---------+---------------+---------+-----------+----------+--------------+ SFJ      Full                                                        +---------+---------------+---------+-----------+----------+--------------+ FV Prox  Full                                                        +---------+---------------+---------+-----------+----------+--------------+ FV Mid   Full                                                         +---------+---------------+---------+-----------+----------+--------------+ FV DistalFull                                                        +---------+---------------+---------+-----------+----------+--------------+ PFV      Full                                                        +---------+---------------+---------+-----------+----------+--------------+ POP      Full           Yes      Yes                                 +---------+---------------+---------+-----------+----------+--------------+ PTV      Full                                                        +---------+---------------+---------+-----------+----------+--------------+  PERO     Full                                                        +---------+---------------+---------+-----------+----------+--------------+ Gastroc  None                    No                   Acute          +---------+---------------+---------+-----------+----------+--------------+   +---------+---------------+---------+-----------+----------+--------------+ LEFT     CompressibilityPhasicitySpontaneityPropertiesThrombus Aging +---------+---------------+---------+-----------+----------+--------------+ CFV      Full           Yes      Yes                                 +---------+---------------+---------+-----------+----------+--------------+ SFJ      Full                                                        +---------+---------------+---------+-----------+----------+--------------+ FV Prox  Full                                                        +---------+---------------+---------+-----------+----------+--------------+ FV Mid   Full                                                        +---------+---------------+---------+-----------+----------+--------------+ FV DistalFull                                                         +---------+---------------+---------+-----------+----------+--------------+ PFV      Full                                                        +---------+---------------+---------+-----------+----------+--------------+ POP      Full           Yes      Yes                                 +---------+---------------+---------+-----------+----------+--------------+ PTV      Full                                                        +---------+---------------+---------+-----------+----------+--------------+  PERO     Full                                                        +---------+---------------+---------+-----------+----------+--------------+ Gastroc  None                    No                   Acute          +---------+---------------+---------+-----------+----------+--------------+    Summary: RIGHT: - Findings consistent with acute deep vein thrombosis involving the right gastrocnemius veins. - No cystic structure found in the popliteal fossa.  LEFT: - Findings consistent with acute deep vein thrombosis involving the left gastrocnemius veins. - No cystic structure found in the popliteal fossa.  *See table(s) above for measurements and observations.    Preliminary    ECHOCARDIOGRAM LIMITED  Result Date: 11/28/2021    ECHOCARDIOGRAM LIMITED REPORT   Patient Name:   Christopher Randall Date of Exam: 11/28/2021 Medical Rec #:  [id number]         Height:       69.0 in Accession #:    [id number]        Weight:       203.7 lb Date of Birth:  23-Dec-1960         BSA:          2.082 m Patient Age:    22 years          BP:           100/70 mmHg Patient Gender: M                 HR:           115 bpm. Exam Location:  Inpatient Procedure: Limited Echo Indications:    TIA Stroke Abnormal ECG  History:        Patient has prior history of Echocardiogram examinations.                 Stroke; Signs/Symptoms:Altered Mental Status.  Sonographer:    Johny Chess RDCS Referring Phys:  [id number] [place]  1. Limited echo: With definity Overall EF preserved distal septal apical and inferior apical hypokinesis No mural apical thormbus seen. FINDINGS  Additional Comments: Limited echo: With definity Overall EF preserved distal septal apical and inferior apical hypokinesis No mural apical thormbus seen. Jenkins Rouge MD Electronically signed by Jenkins Rouge MD Signature Date/Time: 11/28/2021/2:21:40 PM    Final      PHYSICAL EXAM  Temp:  [97.9 F (36.6 C)-98.4 F (36.9 C)] 98.4 F (36.9 C) (12/11 1200) Pulse Rate:  [107-126] 116 (12/11 1400) Resp:  [13-31] 21 (12/11 1400) BP: (115-164)/(61-110) 125/90 (12/11 1400) SpO2:  [87 %-96 %] 94 % (12/11 1400) Weight:  [90.9 kg] 90.9 kg (12/11 0500)  General - Well nourished, well developed, in no apparent distress.  Ophthalmologic - fundi not visualized due to noncooperation.  Cardiovascular - Regular rhythm and tachycardia.  Mental Status -  Level of arousal and orientation to time, place, and person were intact. Language including expression, naming, repetition, comprehension was assessed and found intact. Fund of Knowledge was assessed and was intact.  Cranial Nerves II - XII - II - Visual field intact OU. III, IV, VI -  Extraocular movements intact. V - Facial sensation intact bilaterally. VII - Facial movement intact bilaterally. VIII - Hearing & vestibular intact bilaterally. X - Palate elevates symmetrically. XI - Chin turning & shoulder shrug intact bilaterally. XII - Tongue protrusion intact.  Motor Strength - The patient's strength was normal in all extremities and pronator drift was absent.  Bulk was normal and fasciculations were absent.   Motor Tone - Muscle tone was assessed at the neck and appendages and was normal.  Reflexes - The patient's reflexes were symmetrical in all extremities and he had no pathological reflexes.  Sensory - Light touch, temperature/pinprick were assessed and were  symmetrical.    Coordination - The patient had normal movements in the hands with no ataxia or dysmetria.  Tremor was absent.  Gait and Station - deferred.   ASSESSMENT/PLAN Christopher Randall is a 60 y.o. 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 No tPA given due to outside window, thrombocytopenia.    Stroke:  bilateral anterior and posterior punctate/small infarcts consistent with embolic shower embolic secondary to hypercoagulable state from advanced malignancy, libman-sacks endocarditis, or DVT in the setting of PFO MRI  b/l anterior and posterior punctate/small infarcts consistent with embolic shower MRA distal R VA occlusion, right ACA stenosis. Carotid Doppler  unremarkable 2D Echo EF 55%, no LV thrombus, no PFO TEE can be considered once stable but not going to change management.  LE venous doppler b/l distal DVT LDL 119 HgbA1c 5.8 SCDs for VTE prophylaxis No antithrombotic prior to admission, now on No antithrombotic. OK with heparin IV and plavix from neuro standpoint.  Ongoing aggressive stroke risk factor management Therapy recommendations:  pending Disposition:  pending  B/l DVT LE venous doppler showed b/l distal DVT Likely the cause of b/l LE swelling, weeping wound at forelegs  Likely due to hypercoagulable state from advance malignancy OK with heparin IV from neuro standpoint and then transition to po DOAC   Elevated troponin Cardiology on board EF 55%, no LV thrombus or PFO OK to start plavix per cardiology as long as platelet stable > 50.   Thrombocytopenia  Could be due to chemo therapy Platelet 29->28->31->79->67 OK to start plavix per cardiology as long as platelet stable > 50.  Close montioring  ? Cellulitis/sepsis Leukocytosis WBC 52.5->67->55.3->42.4 B/l foreleg swelling, redness with weeping wound, improving Wound consult  placed Tachycardia  CCM on board On vanco/cefepime   Lung cancer with brain met Oncology following Undergoing chemo SRT for brain mets (b/l frontal lesions) On decadron   Hyperlipidemia Home meds:  none  LDL 119, goal < 70 Now on lipitor 40 Continue statin at discharge  Other Stroke Risk Factors Former Cigarette smoker, quit 8 weeks ago  Other Active Problems Tachycardia  Hyponatremia - monitoring  Hospital day # 2  This patient is critically ill due to embolic shower, hypercoagulable state, advanced malignancy, bilateral DVT, cardiomyopathy, leukocytosis, thrombocytopenia and at significant risk of neurological worsening, death form recurrent stroke, sepsis, PE, heart failure. This patient's care requires constant monitoring of vital signs, hemodynamics, respiratory and cardiac monitoring, review of multiple databases, neurological assessment, discussion with family, other specialists and medical decision making of high complexity. I spent 30 minutes of neurocritical care time in the care of this patient. I discussed with Dr. Lamonte Sakai.    Rosalin Hawking, MD PhD Stroke Neurology 11/29/2021 2:41 PM    To contact Stroke  Continuity provider, please refer to [email or web address] After hours, contact General Neurology

## 2021-11-29 NOTE — Progress Notes (Addendum)
Patient with limited locations to take a BP d/t BL LE DVTs, R arm restricted, L arm with 2 PIV (upper and lower) and swollen elbow. Taking BP approx q4h over L upper arm PIV d/t limited availability. Patient is alert and mentation is appropriate. CCM aware. RN will continue to monitor.   Sharlet Salina, RN

## 2021-11-29 NOTE — Consult Note (Signed)
WOC Nurse Consult Note: Patient receiving care in [facility] 4 N26. Consult completed remotely after review of record. Reason for Consult: BLE weeping and wounds; see photos. Patient is being worked up for possible vasculitis among other conditions. Wound type: unclear at this time. Pressure Injury POA: Yes/No/NA Measurement: Wound bed: see photos Drainage (amount, consistency, odor)  Periwound: Dressing procedure/placement/frequency: Apply to bilateral lower legs and feet wounds AFTER washing with soap and water. Then cover with ABD pads, secure with kerlix. Perform BID. Monitor the wound area(s) for worsening of condition such as: Signs/symptoms of infection,  Increase in size,  Development of or worsening of odor, Development of pain, or increased pain at the affected locations.  Notify the medical team if any of these develop.  Thank you for the consult. [place] nurse will not follow at this time.  Please re-consult the [place] team if needed.  Val Riles, RN, MSN, CWOCN, CNS-BC, pager [phone]

## 2021-11-29 NOTE — Progress Notes (Signed)
ANTICOAGULATION CONSULT NOTE  Pharmacy Consult for Heparin Indication: VTE treatment  Allergies  Allergen Reactions   Iopamidol Hives and Itching    Isovue break through with prep, Hives and Itching  Patient had PET?CT scan with 125 mls of Isovue-300. Noted to have two raised itchy hives which self-resolved. Patient had PET/CT scan with contrast on 05/27/2014 while on prednisone prep (3 doses of 50 mg-13 hour prep) and noted to have two hives which self -resolved without treatment   Iodinated Diagnostic Agents Rash   Iodine-131 Rash    Patient Measurements: Height: 5\' 9"  (175.3 cm) Weight: 90.9 kg (200 lb 6.4 oz) IBW/kg (Calculated) : 70.7 Heparin Dosing Weight: 90 kg  Vital Signs: Temp: 97.8 F (36.6 C) (12/11 1600) Temp Source: Oral (12/11 1600) BP: 117/70 (12/11 1600) Pulse Rate: 114 (12/11 1600)  Labs: Recent Labs    11/27/21 1719 11/27/21 1927 11/27/21 1955 11/27/21 2247 11/28/21 0031 11/28/21 1016 11/28/21 1100 11/28/21 1447 11/29/21 0307 11/29/21 1637  HGB 12.2*  --   --   --  11.3* 10.8*  --  10.2* 10.3*  --   HCT 34.6*  --   --   --  32.0* 30.2*  --  28.7* 29.3*  --   PLT 28*  --   --   --  31* 87*  --  79* 67*  --   LABPROT 19.0*  --   --   --  18.1*  --   --   --   --   --   INR 1.6*  --   --   --  1.5*  --   --   --   --   --   HEPARINUNFRC  --   --   --   --   --   --   --   --   --  <0.10*  CREATININE 0.89  --  0.88  --  1.02  --   --   --   --   --   TROPONINIHS  --    < >  --  2,425*  --   --  7,544* 6,802*  --   --    < > = values in this interval not displayed.     Estimated Creatinine Clearance: 86.9 mL/min (by C-G formula based on SCr of 1.02 mg/dL).   Medical History: Past Medical History:  Diagnosis Date   Abnormal TSH 09/28/2017   Anemia 11/27/2021   Anxiety 07/03/2012   Depression    Difficulty sleeping 09/28/2017   Examination of participant in clinical trial 09/11/2012   History of immunotherapy 02/23/2016   Melanoma of skin  10/26/2011   Nicotine dependence 07/17/2015   Smoker    STEMI (ST elevation myocardial infarction) ([place]) 11/27/2021    Medications:  Scheduled:    stroke: mapping our early stages of recovery book   Does not apply Once   sodium chloride   Intravenous Once   atorvastatin  40 mg Oral Daily   Chlorhexidine Gluconate Cloth  6 each Topical Daily   dexamethasone  1 mg Oral Q breakfast   fluconazole  100 mg Oral Daily   folic acid  1 mg Oral Daily   metoprolol tartrate  12.5 mg Oral BID   nicotine  7 mg Transdermal Daily   pantoprazole  40 mg Oral Daily   silver sulfADIAZINE   Topical BID   vitamin B-12  1,000 mcg Oral Daily   Infusions:   sodium chloride 10  mL/hr at 11/29/21 1600   ceFEPime (MAXIPIME) IV Stopped (11/29/21 1405)   heparin 900 Units/hr (11/29/21 1600)   vancomycin Stopped (11/29/21 0542)    Assessment: 60 y.o. M presents with dyspnea and lower extremity weeping. Pt with lung cancer and known brain mets. Pt with b/l embolic strokes on MRI - neurology following. Also with possible STEMI (but Cardiology feels may be more stress-related cardiomyopathy) - unable to go to emergent [facility] given severe thrombocytopenia so plan to treat medically for time being with heparin and ASA. LE dopplers also resulted positive for bilateral acute DVT. Pharmacy consulted for heparin dosing after platelets given 12/10. CCM, neurology, and cardiology aware of bleeding risks. Both heparin drip and asa were held 12/10 with plt drop to 28 despite transfusion.  Heparin level came back undetectable (<0.1), on 900 units/hr. No s/sx of bleeding or infusion issues.   Goal of Therapy:  Heparin level 0.3-0.5 units/ml Monitor platelets by anticoagulation protocol: Yes   Plan:  Increase heparin drip to 1050 units/hr. No bolus with severe thrombocytopenia and recent CVA.  F/u heparin level in 6 hours Monitor daily CBC, f/u platelets closely and for any s/s bleeding  Antonietta Jewel, PharmD,  [place] Pharmacist  Phone: [phone] 11/29/2021 7:07 PM  Please check AMION for all [place] phone numbers After 10:00 PM, call [place] [phone]

## 2021-11-29 NOTE — Progress Notes (Signed)
Cardiology Progress Note  Patient ID: MARCQUES WRIGHTSMAN MRN: [id number] DOB: Apr 05, 1961 Date of Encounter: 11/29/2021  Primary Cardiologist: None  Subjective   Chief Complaint: Confusion.   HPI: Echo with contrast shows inferior septum and apical wall motion abnormality.  ROS:  All other ROS reviewed and negative. Pertinent positives noted in the HPI.     Inpatient Medications  Scheduled Meds:   stroke: mapping our early stages of recovery book   Does not apply Once   sodium chloride   Intravenous Once   atorvastatin  40 mg Oral Daily   Chlorhexidine Gluconate Cloth  6 each Topical Daily   dexamethasone  1 mg Oral Q breakfast   fluconazole  100 mg Oral Daily   folic acid  1 mg Oral Daily   pantoprazole  40 mg Oral Daily   vitamin B-12  1,000 mcg Oral Daily   Continuous Infusions:  sodium chloride Stopped (11/29/21 0644)   ceFEPime (MAXIPIME) IV Stopped (11/29/21 3762)   heparin     vancomycin Stopped (11/29/21 0542)   PRN Meds: sodium chloride, acetaminophen **OR** acetaminophen, clonazePAM, HYDROcodone-acetaminophen, magic mouthwash w/lidocaine, ondansetron **OR** ondansetron (ZOFRAN) IV, polyvinyl alcohol   Vital Signs   Vitals:   11/29/21 0500 11/29/21 0600 11/29/21 0700 11/29/21 0800  BP:  (!) 145/92 (!) 164/104 (!) 145/91  Pulse:  (!) 107 (!) 115 (!) 118  Resp:  15 17 (!) 29  Temp:    98.3 F (36.8 C)  TempSrc:    Oral  SpO2:  92% 92% 91%  Weight: 90.9 kg     Height:        Intake/Output Summary (Last 24 hours) at 11/29/2021 0959 Last data filed at 11/29/2021 0845 Gross per 24 hour  Intake 1207.07 ml  Output 1625 ml  Net -417.93 ml   Last 3 Weights 11/29/2021 11/28/2021 11/27/2021  Weight (lbs) 200 lb 6.4 oz 203 lb 11.3 oz 203 lb 12.8 oz  Weight (kg) 90.9 kg 92.4 kg 92.443 kg      Telemetry  Overnight telemetry shows sinus tachycardia heart rate 120s, which I personally reviewed.   Physical Exam   Vitals:   11/29/21 0500 11/29/21 0600  11/29/21 0700 11/29/21 0800  BP:  (!) 145/92 (!) 164/104 (!) 145/91  Pulse:  (!) 107 (!) 115 (!) 118  Resp:  15 17 (!) 29  Temp:    98.3 F (36.8 C)  TempSrc:    Oral  SpO2:  92% 92% 91%  Weight: 90.9 kg     Height:        Intake/Output Summary (Last 24 hours) at 11/29/2021 0959 Last data filed at 11/29/2021 0845 Gross per 24 hour  Intake 1207.07 ml  Output 1625 ml  Net -417.93 ml    Last 3 Weights 11/29/2021 11/28/2021 11/27/2021  Weight (lbs) 200 lb 6.4 oz 203 lb 11.3 oz 203 lb 12.8 oz  Weight (kg) 90.9 kg 92.4 kg 92.443 kg    Body mass index is 29.59 kg/m.   General: Ill-appearing Head: Atraumatic, normal size  Eyes: PEERLA, EOMI  Neck: Supple, no JVD Endocrine: No thryomegaly Cardiac: Normal S1, S2; tachycardia, no murmurs Lungs: Diminished breath sounds bilaterally Abd: Soft, nontender, no hepatomegaly  Ext: No edema, pulses 2+ Musculoskeletal: No deformities, BUE and BLE strength normal and equal Skin: Erythematous lower extremities Neuro: Alert and oriented to person, place, time, and situation, CNII-XII grossly intact, no focal deficits  Psych: Normal mood and affect   Labs  High Sensitivity Troponin:  Recent Labs  Lab 11/27/21 1927 11/27/21 2247 11/28/21 1100 11/28/21 1447  TROPONINIHS 2,168* 2,425* 6,269* 6,802*     Cardiac EnzymesNo results for input(s): TROPONINI in the last 168 hours. No results for input(s): TROPIPOC in the last 168 hours.  Chemistry Recent Labs  Lab 11/27/21 1142 11/27/21 1719 11/27/21 1955 11/28/21 0031  NA 120* 119* 120* 121*  K 4.0 4.0 3.9 3.7  CL 83* 81* 83* 82*  CO2 25 23 23  21*  GLUCOSE 93 93 88 90  BUN 18 17 16 17   CREATININE 0.82 0.89 0.88 1.02  CALCIUM 8.0* 8.1* 7.9* 7.7*  PROT 5.6* 5.4*  --  4.9*  ALBUMIN 2.7* 2.6*  --  2.4*  AST 40 42*  --  39  ALT 31 33  --  31  ALKPHOS 161* 146*  --  133*  BILITOT 0.4 0.7  --  1.0  GFRNONAA >60 >60 >60 >60  ANIONGAP 12 15 14  18*    Hematology Recent Labs  Lab  11/28/21 1016 11/28/21 1447 11/29/21 0307  WBC 43.9* 39.7* 42.4*  RBC 3.42* 3.24* 3.28*  HGB 10.8* 10.2* 10.3*  HCT 30.2* 28.7* 29.3*  MCV 88.3 88.6 89.3  MCH 31.6 31.5 31.4  MCHC 35.8 35.5 35.2  RDW 14.7 14.8 15.1  PLT 87* 79* 67*   BNP Recent Labs  Lab 11/27/21 2014  BNP 472.9*    DDimer No results for input(s): DDIMER in the last 168 hours.   Radiology  DG Chest 2 View  Result Date: 11/27/2021 CLINICAL DATA:  Suspected sepsis EXAM: CHEST - 2 VIEW COMPARISON:  10/27/2021 FINDINGS: Heart is normal size. Bilateral airspace disease, most pronounced in the right lower lobe and left upper lobe. Small right pleural effusion. No acute bony abnormality. IMPRESSION: Bilateral airspace disease with small right effusion. Findings are concerning for multifocal pneumonia. Electronically Signed   By: Rolm Baptise M.D.   On: 11/27/2021 18:14   MR ANGIO HEAD WO CONTRAST  Result Date: 11/28/2021 CLINICAL DATA:  Metastatic disease to brain.  Acute stroke. EXAM: MRA HEAD WITHOUT CONTRAST TECHNIQUE: Angiographic images of the Circle of Willis were acquired using MRA technique without intravenous contrast. COMPARISON:  MRI head 11/27/2021 FINDINGS: Anterior circulation: Internal carotid artery patent bilaterally. Moderate stenosis right A2 segment. Left anterior cerebral artery widely patent. Middle cerebral artery widely patent bilaterally. Posterior circulation: Left vertebral artery is widely patent supplies the basilar. Left PICA patent. Distal right vertebral artery not visualized and may be occluded. This appears to be a small vessel on the MRI. Basilar widely patent. Posterior cerebral arteries patent without large vessel occlusion. Fetal origin right posterior cerebral artery. Anatomic variants: None Other: Negative for cerebral aneurysm. IMPRESSION: Moderate stenosis right anterior cerebral artery. Left anterior cerebral artery widely patent. Both middle cerebral arteries patent Loss of flow  related signal distal right vertebral artery. This may be occluded. Electronically Signed   By: Franchot Gallo M.D.   On: 11/28/2021 13:35   CT ELBOW LEFT W CONTRAST  Result Date: 11/28/2021 CLINICAL DATA:  Soft tissue infection suspected, upper arm, xray done EXAM: CT OF THE UPPER LEFT EXTREMITY WITH CONTRAST TECHNIQUE: Multidetector CT imaging of the upper left extremity was performed according to the standard protocol following intravenous contrast administration. CONTRAST:  123mL OMNIPAQUE IOHEXOL 300 MG/ML  SOLN COMPARISON:  None. FINDINGS: Technical note: Examination is limited by beam hardening artifact related to patient's positioning of the elbow adjacent to the abdominal wall. Elbow is in flexion resulting in nonstandard  imaging planes. There is also slight motion degradation affecting all series. Bones/Joint/Cartilage No acute fracture. No dislocation. No significant elbow joint effusion is seen. No bony destruction or periosteal elevation. Ligaments Suboptimally assessed by CT. Muscles and Tendons Musculotendinous structures are poorly evaluated. No obvious intramuscular fluid collection. Soft tissues Fluid collection overlying the olecranon measuring approximately 3.3 x 1.2 x 3.0 cm with peripheral enhancement (series 8, image 119; series 11, image 71) may represent abscess or bursitis. There is prominent soft tissue edema and ill-defined fluid at the elbow and proximal to mid forearm. No soft tissue gas. IMPRESSION: 1. Limited exam. 2. Fluid collection overlying the olecranon measuring approximately 3.3 x 1.2 x 3.0 cm with peripheral enhancement may represent abscess or bursitis. 3. Prominent soft tissue edema and ill-defined fluid at the elbow and proximal to mid forearm suggestive of cellulitis. No soft tissue gas. 4. No acute osseous abnormality. 5. No appreciable elbow joint effusion to suggest septic arthritis. Electronically Signed   By: Davina Poke D.O.   On: 11/28/2021 19:46    ECHOCARDIOGRAM COMPLETE BUBBLE STUDY  Result Date: 11/28/2021    ECHOCARDIOGRAM REPORT   Patient Name:   DERRY KASSEL Date of Exam: 11/28/2021 Medical Rec #:  [id number]         Height:       69.0 in Accession #:    [id number]        Weight:       203.7 lb Date of Birth:  Jul 25, 1961         BSA:          2.082 m Patient Age:    21 years          BP:           137/84 mmHg Patient Gender: M                 HR:           114 bpm. Exam Location:  Inpatient Procedure: 2D Echo and Saline Contrast Bubble Study Indications:     stroke  History:         Patient has no prior history of Echocardiogram examinations.                  Lung cancer. sepsis.  Sonographer:     Johny Chess RDCS Referring Phys:  [id number] [place] Diagnosing Phys: Jenkins Rouge MD  Sonographer Comments: Image acquisition challenging due to respiratory motion. IMPRESSIONS  1. Patient to return for more images to assess RWMAls and apex with definity . Left ventricular ejection fraction, by estimation, is 55%. The left ventricle has normal function. The left ventricle has no regional wall motion abnormalities. Left ventricular diastolic parameters were normal.  2. Right ventricular systolic function is normal. The right ventricular size is normal.  3. No evidence of tamponade . Small to moderate. The pericardial effusion is posterior to the left ventricle and anterior to the right ventricle. There is no evidence of cardiac tamponade.  4. The mitral valve is grossly normal. No evidence of mitral valve regurgitation. No evidence of mitral stenosis.  5. The aortic valve is tricuspid. Aortic valve regurgitation is not visualized. No aortic stenosis is present.  6. The inferior vena cava is normal in size with greater than 50% respiratory variability, suggesting right atrial pressure of 3 mmHg.  7. Agitated saline contrast bubble study was negative, with no evidence of any interatrial shunt. FINDINGS  Left Ventricle: Patient to return  for  more images to assess RWMAls and apex with definity. Left ventricular ejection fraction, by estimation, is 55%. The left ventricle has normal function. The left ventricle has no regional wall motion abnormalities. The left ventricular internal cavity size was normal in size. There is no left ventricular hypertrophy. Left ventricular diastolic parameters were normal. Right Ventricle: The right ventricular size is normal. No increase in right ventricular wall thickness. Right ventricular systolic function is normal. Left Atrium: Left atrial size was normal in size. Right Atrium: Right atrial size was normal in size. Pericardium: No evidence of tamponade. Small to moderate. The pericardial effusion is posterior to the left ventricle and anterior to the right ventricle. There is no evidence of cardiac tamponade. Presence of epicardial fat layer. Mitral Valve: The mitral valve is grossly normal. No evidence of mitral valve regurgitation. No evidence of mitral valve stenosis. Tricuspid Valve: The tricuspid valve is grossly normal. Tricuspid valve regurgitation is mild . No evidence of tricuspid stenosis. Aortic Valve: The aortic valve is tricuspid. Aortic valve regurgitation is not visualized. No aortic stenosis is present. Pulmonic Valve: The pulmonic valve was grossly normal. Pulmonic valve regurgitation is not visualized. No evidence of pulmonic stenosis. Aorta: The aortic root and ascending aorta are structurally normal, with no evidence of dilitation. Venous: The inferior vena cava is normal in size with greater than 50% respiratory variability, suggesting right atrial pressure of 3 mmHg. IAS/Shunts: The atrial septum is grossly normal. Agitated saline contrast was given intravenously to evaluate for intracardiac shunting. Agitated saline contrast bubble study was negative, with no evidence of any interatrial shunt.  LEFT VENTRICLE PLAX 2D LVIDd:         4.10 cm LVIDs:         2.60 cm LV PW:         1.10 cm LV  IVS:        1.00 cm LVOT diam:     2.10 cm LVOT Area:     3.46 cm  RIGHT VENTRICLE         IVC TAPSE (M-mode): 2.7 cm  IVC diam: 2.00 cm LEFT ATRIUM             Index        RIGHT ATRIUM           Index LA diam:        3.40 cm 1.63 cm/m   RA Area:     16.90 cm LA Vol (A2C):   26.3 ml 12.63 ml/m  RA Volume:   39.50 ml  18.97 ml/m LA Vol (A4C):   32.4 ml 15.58 ml/m LA Biplane Vol: 29.2 ml 14.02 ml/m   AORTA Ao Root diam: 3.70 cm Ao Asc diam:  3.60 cm  SHUNTS Systemic Diam: 2.10 cm Jenkins Rouge MD Electronically signed by Jenkins Rouge MD Signature Date/Time: 11/28/2021/1:26:38 PM    Final (Updated)    VAS US CAROTID  Result Date: 11/28/2021 Carotid Arterial Duplex Study Patient Name:  [name]  Date of Exam:   11/28/2021 Medical Rec #: [id number]          Accession #:    [id number] Date of Birth: February 16, 1961          Patient Gender: M Patient Age:   35 years Exam Location:  [facility] Procedure:      VAS US CAROTID Referring Phys: Alferd Patee [facility] --------------------------------------------------------------------------------  Indications:       CVA. Risk Factors:      Past history of smoking,  prior MI. Comparison Study:  No prior study Performing Technologist: Maudry Mayhew MHA, RDMS, RVT, RDCS  Examination Guidelines: A complete evaluation includes B-mode imaging, spectral Doppler, color Doppler, and power Doppler as needed of all accessible portions of each vessel. Bilateral testing is considered an integral part of a complete examination. Limited examinations for reoccurring indications may be performed as noted.  Right Carotid Findings: +----------+--------+-------+--------+----------------------+------------------+           PSV cm/sEDV    StenosisPlaque Description    Comments                             cm/s                                                    +----------+--------+-------+--------+----------------------+------------------+ CCA Prox  103     24                                    intimal thickening +----------+--------+-------+--------+----------------------+------------------+ CCA Distal57      13                                   intimal thickening +----------+--------+-------+--------+----------------------+------------------+ ICA Prox  37      17             smooth and                                                                heterogenous                             +----------+--------+-------+--------+----------------------+------------------+ ICA Distal66      30                                                      +----------+--------+-------+--------+----------------------+------------------+ ECA       125     12             smooth and                                                                heterogenous                             +----------+--------+-------+--------+----------------------+------------------+ +----------+--------+-------+----------------+-------------------+           PSV cm/sEDV cmsDescribe        Arm Pressure (mmHG) +----------+--------+-------+----------------+-------------------+ [id number]             Multiphasic, WNL                    +----------+--------+-------+----------------+-------------------+ +---------+--------+--+--------+-+---------+  VertebralPSV cm/s20EDV cm/s6Antegrade +---------+--------+--+--------+-+---------+  Left Carotid Findings: +----------+--------+-------+--------+----------------------+------------------+           PSV cm/sEDV    StenosisPlaque Description    Comments                             cm/s                                                    +----------+--------+-------+--------+----------------------+------------------+ CCA Prox  117     23                                   intimal thickening +----------+--------+-------+--------+----------------------+------------------+ CCA Distal58      17                                                       +----------+--------+-------+--------+----------------------+------------------+ ICA Prox  34      11             smooth and                                                                heterogenous                             +----------+--------+-------+--------+----------------------+------------------+ ICA Distal75      34                                                      +----------+--------+-------+--------+----------------------+------------------+ ECA       73      10                                                      +----------+--------+-------+--------+----------------------+------------------+ +----------+--------+--------+----------------+-------------------+           PSV cm/sEDV cm/sDescribe        Arm Pressure (mmHG) +----------+--------+--------+----------------+-------------------+ [id number]             Multiphasic, WNL                    +----------+--------+--------+----------------+-------------------+ +---------+--------+--+--------+--+---------+ VertebralPSV cm/s63EDV cm/s32Antegrade +---------+--------+--+--------+--+---------+   Summary: Right Carotid: Velocities in the right ICA are consistent with a 1-39% stenosis. Left Carotid: Velocities in the left ICA are consistent with a 1-39% stenosis.               Heterogenous area of the left subclavian area noted; etiology               unknown. Vertebrals:  Bilateral vertebral arteries demonstrate antegrade flow. Subclavians: Normal flow hemodynamics were seen in bilateral subclavian              arteries. *See table(s) above for measurements and observations.     Preliminary    VAS [place] LOWER EXTREMITY VENOUS (DVT) (ONLY [facility] & [facility])  Result Date: 11/28/2021  Lower Venous DVT Study Patient Name:  IZEYAH DEIKE  Date of Exam:   11/28/2021 Medical Rec #: [id number]          Accession #:    [id number] Date of Birth: 31-Aug-1961           Patient Gender: M Patient Age:   75 years Exam Location:  [facility] Procedure:      VAS [place] LOWER EXTREMITY VENOUS (DVT) Referring Phys: JOSHUA LONG --------------------------------------------------------------------------------  Indications: Edema, and ulceration.  Risk Factors: Cancer Metastatic non-small cell lung cancer. Comparison Study: No prior study Performing Technologist: Maudry Mayhew MHA, RDMS, RVT, RDCS  Examination Guidelines: A complete evaluation includes B-mode imaging, spectral Doppler, color Doppler, and power Doppler as needed of all accessible portions of each vessel. Bilateral testing is considered an integral part of a complete examination. Limited examinations for reoccurring indications may be performed as noted. The reflux portion of the exam is performed with the patient in reverse Trendelenburg.  +---------+---------------+---------+-----------+----------+--------------+ RIGHT    CompressibilityPhasicitySpontaneityPropertiesThrombus Aging +---------+---------------+---------+-----------+----------+--------------+ CFV      Full           Yes      Yes                                 +---------+---------------+---------+-----------+----------+--------------+ SFJ      Full                                                        +---------+---------------+---------+-----------+----------+--------------+ FV Prox  Full                                                        +---------+---------------+---------+-----------+----------+--------------+ FV Mid   Full                                                        +---------+---------------+---------+-----------+----------+--------------+ FV DistalFull                                                        +---------+---------------+---------+-----------+----------+--------------+ PFV      Full                                                         +---------+---------------+---------+-----------+----------+--------------+  POP      Full           Yes      Yes                                 +---------+---------------+---------+-----------+----------+--------------+ PTV      Full                                                        +---------+---------------+---------+-----------+----------+--------------+ PERO     Full                                                        +---------+---------------+---------+-----------+----------+--------------+ Gastroc  None                    No                   Acute          +---------+---------------+---------+-----------+----------+--------------+   +---------+---------------+---------+-----------+----------+--------------+ LEFT     CompressibilityPhasicitySpontaneityPropertiesThrombus Aging +---------+---------------+---------+-----------+----------+--------------+ CFV      Full           Yes      Yes                                 +---------+---------------+---------+-----------+----------+--------------+ SFJ      Full                                                        +---------+---------------+---------+-----------+----------+--------------+ FV Prox  Full                                                        +---------+---------------+---------+-----------+----------+--------------+ FV Mid   Full                                                        +---------+---------------+---------+-----------+----------+--------------+ FV DistalFull                                                        +---------+---------------+---------+-----------+----------+--------------+ PFV      Full                                                        +---------+---------------+---------+-----------+----------+--------------+  POP      Full           Yes      Yes                                  +---------+---------------+---------+-----------+----------+--------------+ PTV      Full                                                        +---------+---------------+---------+-----------+----------+--------------+ PERO     Full                                                        +---------+---------------+---------+-----------+----------+--------------+ Gastroc  None                    No                   Acute          +---------+---------------+---------+-----------+----------+--------------+    Summary: RIGHT: - Findings consistent with acute deep vein thrombosis involving the right gastrocnemius veins. - No cystic structure found in the popliteal fossa.  LEFT: - Findings consistent with acute deep vein thrombosis involving the left gastrocnemius veins. - No cystic structure found in the popliteal fossa.  *See table(s) above for measurements and observations.    Preliminary    ECHOCARDIOGRAM LIMITED  Result Date: 11/28/2021    ECHOCARDIOGRAM LIMITED REPORT   Patient Name:   REN ASPINALL Date of Exam: 11/28/2021 Medical Rec #:  [id number]         Height:       69.0 in Accession #:    [id number]        Weight:       203.7 lb Date of Birth:  1961/12/01         BSA:          2.082 m Patient Age:    28 years          BP:           100/70 mmHg Patient Gender: M                 HR:           115 bpm. Exam Location:  Inpatient Procedure: Limited Echo Indications:    TIA Stroke Abnormal ECG  History:        Patient has prior history of Echocardiogram examinations.                 Stroke; Signs/Symptoms:Altered Mental Status.  Sonographer:    Johny Chess RDCS Referring Phys: [id number] [place]  1. Limited echo: With definity Overall EF preserved distal septal apical and inferior apical hypokinesis No mural apical thormbus seen. FINDINGS  Additional Comments: Limited echo: With definity Overall EF preserved distal septal apical and inferior apical hypokinesis No  mural apical thormbus seen. Jenkins Rouge MD Electronically signed by Jenkins Rouge MD Signature Date/Time: 11/28/2021/2:21:40 PM    Final     Cardiac Studies  TTE 11/28/2021  1. Patient to return for more images  to assess RWMAls and apex with  definity . Left ventricular ejection fraction, by estimation, is 55%. The  left ventricle has normal function. The left ventricle has no regional  wall motion abnormalities. Left  ventricular diastolic parameters were normal.   2. Right ventricular systolic function is normal. The right ventricular  size is normal.   3. No evidence of tamponade . Small to moderate. The pericardial effusion  is posterior to the left ventricle and anterior to the right ventricle.  There is no evidence of cardiac tamponade.   4. The mitral valve is grossly normal. No evidence of mitral valve  regurgitation. No evidence of mitral stenosis.   5. The aortic valve is tricuspid. Aortic valve regurgitation is not  visualized. No aortic stenosis is present.   6. The inferior vena cava is normal in size with greater than 50%  respiratory variability, suggesting right atrial pressure of 3 mmHg.   7. Agitated saline contrast bubble study was negative, with no evidence  of any interatrial shunt.   Patient Profile  NIRVAAN FRETT is a 60 y.o. 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  MRI shows concerns for new stroke.  EKG consistent with inferolateral ST elevation.  Cardiology was consulted for his abnormal EKG.  Assessment & Plan   #ST elevation myocardial infarction -EKG shows inferior lateral ST elevation.  Troponins are elevated.  Echocardiogram with contrast shows normal LVEF 60% but he does have akinesis of the apex and distal septum. -He is now here with stroke and concerns for sepsis. -He also has metastatic lung cancer with brain mets -Admitted  with severe thrombocytopenia with platelets of 28,000.  He was not a candidate for invasive angiography. -He was transfused platelets and they have improved but are trending down.  They did increase to 87,000 but are trending down to 67,000. -Overall based on the results of his echo we cannot exclude that he is actually undergone an ST elevation myocardial infarction.  It is reassuring that his overall LV function is preserved.  Given his metastatic cancer with brain mets and new strokes he is not a candidate for invasive angiography.  His platelets are trending down.  I believe thrombocytopenia will be an ongoing issue for him.  He has an ongoing need for chemotherapy. -I had a long discussion with the patient and the wife.  My recommendation is medical therapy.  He describes no symptoms of chest pain.  He is not in congestive heart failure.  I do believe the stroke possibly came from an LV apical thrombus.  See discussion below.  But this was not seen on the study. -For now would continue with anticoagulation as this has been deemed appropriate by neurology.  Would recommend antiplatelet agent such as Plavix 75 mg daily.  We will discuss this with neurology.  I think we can treat him medically for presumed ST elevation myocardial infarction.  If he is deemed too high risk to be on this per oncology or neurology it would be okay to forego this.  I believe the approach is palliative regardless given his overall medical condition. -I will add metoprolol to tartrate 12.5 twice daily.  Continue Lipitor 80 mg daily.  We will treat him with heparin for 48 hours as long as platelets are above 50,000 and deemed appropriate by neurology.  Would add Plavix 75 mg daily once cleared by neurology.  #  CVA -Echo demonstrates akinesis of the apex.  This could have been a nidus for thrombus and could explain his stroke.  Anticoagulation will be resumed by critical care medicine. -There was no definitive thrombus seen on the  echocardiogram.  However this clearly could have embolized.  I believe this could explain his stroke.  Would recommend anticoagulation if this is deemed safe by neurology.  Clearly his platelets will need to be followed.  #Metastatic non-small cell lung cancer #Brain mets #Thrombocytopenia -He is undergoing treatment for metastatic non-small cell lung cancer with significant brain mets.  Admitted with significant thrombocytopenia and concerns for ST elevation myocardial infarction.  He has an ongoing need for chemotherapy. -Overall medical management has been recommended.  I do not believe he is a good candidate for invasive angiography.  We really will not get much out of this anyway as he has globally preserved LV function.  I think this can just be treated medically as these agents could be stopped if needed. -Discussed this with the family and the patient at the bedside today. -Would also appreciate oncology weighing in on antiplatelet agents.  Clearly this is a delicate situation.  #PNA? #LE cellulitis  -CXR with multifocal PNA -Abx per CCM  For questions or updates, please contact [place] Please consult [email or web address] for contact info under   Time Spent with Patient: I have spent a total of 35 minutes with patient reviewing hospital notes, telemetry, EKGs, labs and examining the patient as well as establishing an assessment and plan that was discussed with the patient.  > 50% of time was spent in direct patient care.    Signed, Addison Naegeli. Audie Box, MD, [facility]  11/29/2021 9:59 AM

## 2021-11-30 ENCOUNTER — Ambulatory Visit (HOSPITAL_COMMUNITY)
Admission: RE | Admit: 2021-11-30 | Discharge: 2021-11-30 | Disposition: A | Discharge status: Home / Self Care | Payer: BC Managed Care – PPO | Source: Ambulatory Visit | Attending: Hematology | Admitting: Hematology

## 2021-11-30 ENCOUNTER — Inpatient Hospital Stay: Payer: BC Managed Care – PPO

## 2021-11-30 ENCOUNTER — Inpatient Hospital Stay: Payer: BC Managed Care – PPO | Admitting: *Deleted

## 2021-11-30 ENCOUNTER — Inpatient Hospital Stay (HOSPITAL_COMMUNITY): Payer: BC Managed Care – PPO

## 2021-11-30 ENCOUNTER — Other Ambulatory Visit: Payer: BC Managed Care – PPO

## 2021-11-30 ENCOUNTER — Inpatient Hospital Stay: Payer: BC Managed Care – PPO | Admitting: Hematology

## 2021-11-30 DIAGNOSIS — L03119 Cellulitis of unspecified part of limb: Secondary | ICD-10-CM

## 2021-11-30 DIAGNOSIS — E871 Hypo-osmolality and hyponatremia: Secondary | ICD-10-CM

## 2021-11-30 DIAGNOSIS — J9601 Acute respiratory failure with hypoxia: Secondary | ICD-10-CM

## 2021-11-30 LAB — CBC
HCT: 28.5 % — ABNORMAL LOW (ref 39.0–52.0)
Hemoglobin: 10 g/dL — ABNORMAL LOW (ref 13.0–17.0)
MCH: 31.7 pg (ref 26.0–34.0)
MCHC: 35.1 g/dL (ref 30.0–36.0)
MCV: 90.5 fL (ref 80.0–100.0)
Platelets: 66 10*3/uL — ABNORMAL LOW (ref 150–400)
RBC: 3.15 MIL/uL — ABNORMAL LOW (ref 4.22–5.81)
RDW: 15.5 % (ref 11.5–15.5)
WBC: 40.8 10*3/uL — ABNORMAL HIGH (ref 4.0–10.5)
nRBC: 0.2 % (ref 0.0–0.2)

## 2021-11-30 LAB — SODIUM, URINE, RANDOM: Sodium, Ur: 46 mmol/L

## 2021-11-30 LAB — BASIC METABOLIC PANEL
Anion gap: 11 (ref 5–15)
BUN: 17 mg/dL (ref 6–20)
CO2: 23 mmol/L (ref 22–32)
Calcium: 7.8 mg/dL — ABNORMAL LOW (ref 8.9–10.3)
Chloride: 88 mmol/L — ABNORMAL LOW (ref 98–111)
Creatinine, Ser: 0.68 mg/dL (ref 0.61–1.24)
GFR, Estimated: >60 mL/min (ref >60)
Glucose, Bld: 97 mg/dL (ref 70–99)
Potassium: 3.7 mmol/L (ref 3.5–5.1)
Sodium: 122 mmol/L — ABNORMAL LOW (ref 135–145)

## 2021-11-30 LAB — OSMOLALITY: Osmolality: 267 mOsm/kg — ABNORMAL LOW (ref 275–295)

## 2021-11-30 LAB — MAGNESIUM: Magnesium: 1.8 mg/dL (ref 1.7–2.4)

## 2021-11-30 LAB — HEPARIN LEVEL (UNFRACTIONATED)
Heparin Unfractionated: <0.1 IU/mL — ABNORMAL LOW (ref 0.30–0.70)
Heparin Unfractionated: 0.16 IU/mL — ABNORMAL LOW (ref 0.30–0.70)
Heparin Unfractionated: 0.24 IU/mL — ABNORMAL LOW (ref 0.30–0.70)

## 2021-11-30 LAB — PHOSPHORUS: Phosphorus: 2.8 mg/dL (ref 2.5–4.6)

## 2021-11-30 IMAGING — DX DG CHEST 1V PORT
1 series · 1 of 1 positions shown · non-contrast
Comparison: [DATE].

CLINICAL DATA: Shortness of breath.

EXAM:
PORTABLE CHEST 1 VIEW

[chest ap]
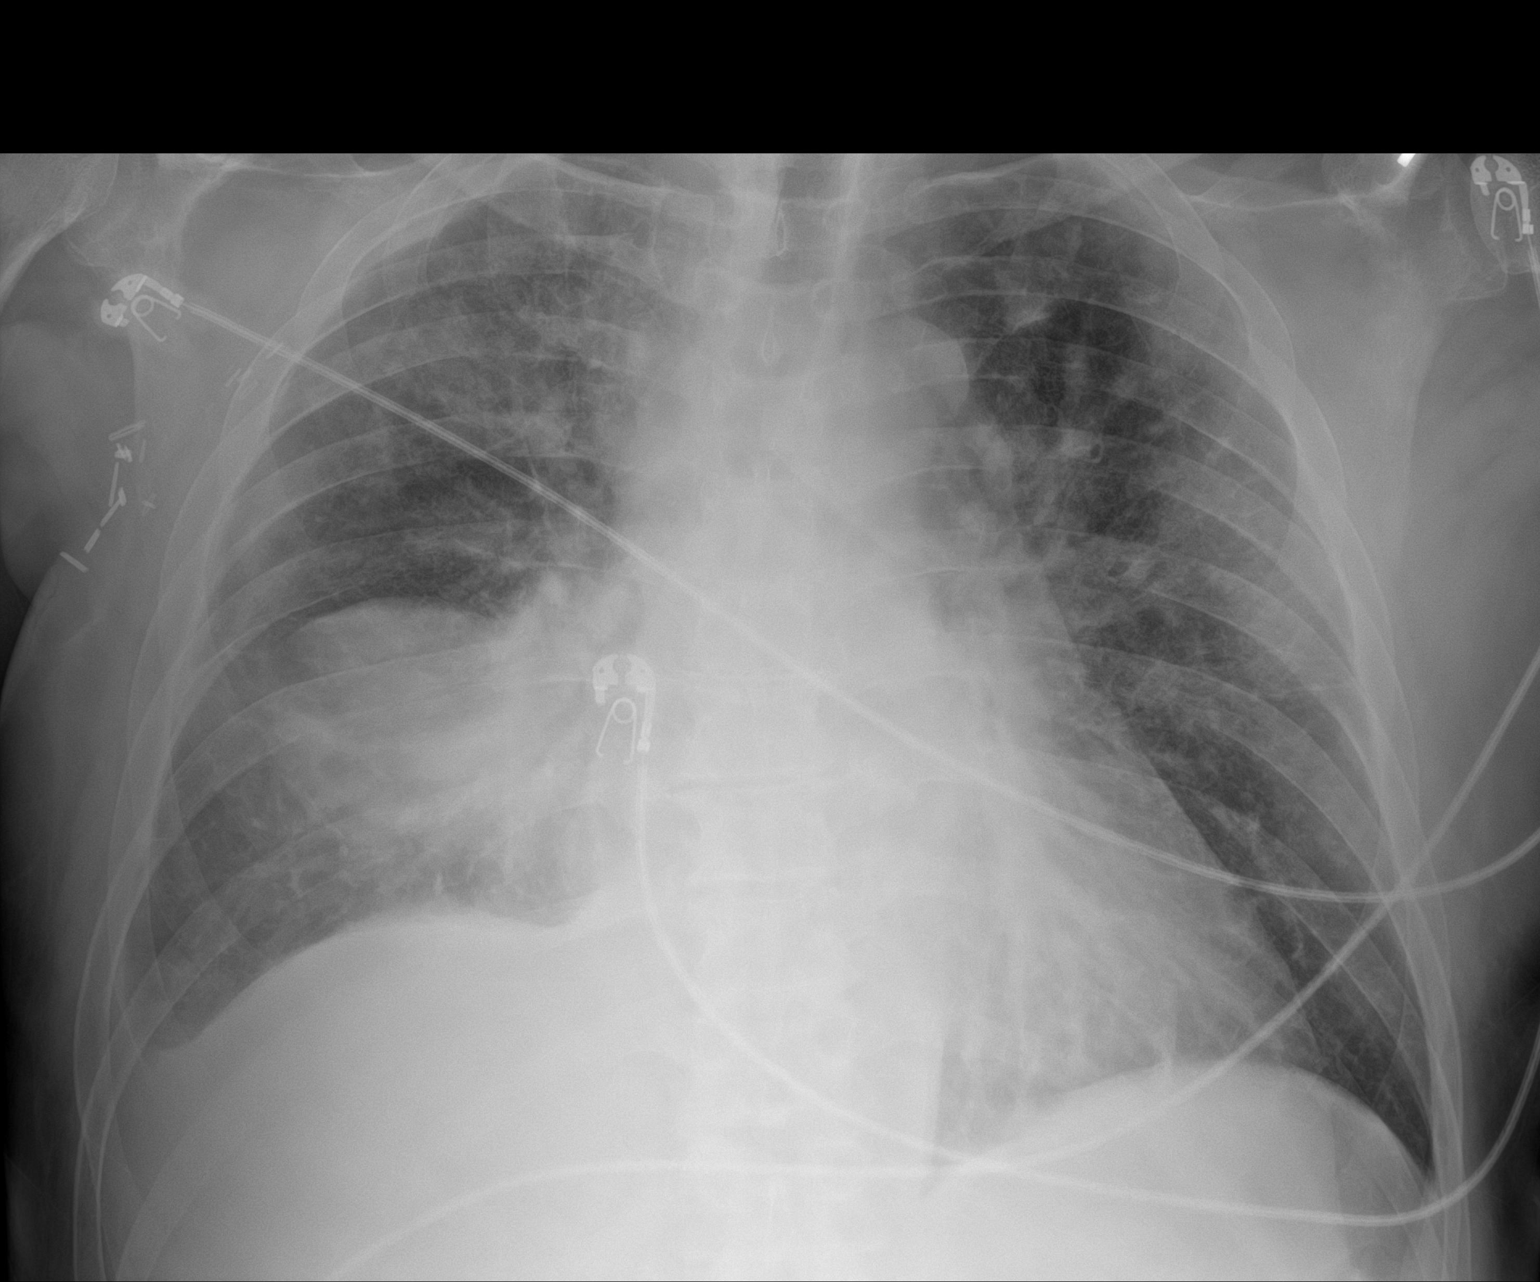

[1 of 1 positions shown; findings below may reference images not displayed]

FINDINGS: Stable cardiomediastinal silhouette. Increased central pulmonary
vascular congestion is noted as well as bilateral lung opacities
concerning for edema. Increased right middle lobe opacity is noted
concerning for pneumonia, atelectasis or possibly fluid within the
right minor fissure. Bony thorax is unremarkable.
IMPRESSION: Increased central pulmonary vascular congestion is noted with
increased bilateral lung opacities concerning for edema. Increased
right middle lobe opacity is noted concerning for pneumonia,
atelectasis or possibly fluid within the right minor fissure.

## 2021-11-30 MED ORDER — MEGESTROL ACETATE 40 MG PO TABS
160.0000 mg | ORAL_TABLET | Freq: Every day | ORAL | Status: DC
  Administered 2021-11-30 – 2021-12-03 (×4): 160 mg via ORAL
  Filled 2021-11-30 (×6): qty 4

## 2021-11-30 MED ORDER — FUROSEMIDE 10 MG/ML IJ SOLN
40.0000 mg | Freq: Once | INTRAMUSCULAR | Status: AC
  Administered 2021-11-30: 40 mg via INTRAVENOUS
  Filled 2021-11-30: qty 4

## 2021-11-30 MED ORDER — CLOPIDOGREL BISULFATE 75 MG PO TABS
75.0000 mg | ORAL_TABLET | Freq: Every day | ORAL | Status: DC
  Administered 2021-11-30 – 2021-12-01 (×2): 75 mg via ORAL
  Filled 2021-11-30 (×2): qty 1

## 2021-11-30 MED ORDER — VANCOMYCIN HCL IN DEXTROSE 1-5 GM/200ML-% IV SOLN
1000.0000 mg | Freq: Two times a day (BID) | INTRAVENOUS | Status: DC
  Administered 2021-12-01 – 2021-12-02 (×3): 1000 mg via INTRAVENOUS
  Filled 2021-11-30 (×3): qty 200

## 2021-11-30 NOTE — Progress Notes (Signed)
STROKE TEAM PROGRESS NOTE   SUBJECTIVE (INTERVAL HISTORY) His wife and other friends are at the bedside.  Pt lying in bed, neuro intact, no acute event overnight. On heparin IV and plavix. Platelet stable 66.   OBJECTIVE Temp:  [97.6 F (36.4 C)-98.2 F (36.8 C)] 98.2 F (36.8 C) (12/12 1200) Pulse Rate:  [105-116] 114 (12/12 1140) Cardiac Rhythm: Sinus tachycardia (12/12 0800) Resp:  [14-24] 24 (12/12 1140) BP: (98-151)/(70-98) 151/98 (12/12 1140) SpO2:  [88 %-96 %] 93 % (12/12 1140) Weight:  [94.2 kg] 94.2 kg (12/12 0500)  No results for input(s): GLUCAP in the last 168 hours. Recent Labs  Lab 11/27/21 1142 11/27/21 1719 11/27/21 1955 11/28/21 0031 11/30/21 0406  NA 120* 119* 120* 121* 122*  K 4.0 4.0 3.9 3.7 3.7  CL 83* 81* 83* 82* 88*  CO2 _0 21* 23  GLUCOSE 93 93 88 90 97  BUN _1 CREATININE 0.82 0.89 0.88 1.02 0.68  CALCIUM 8.0* 8.1* 7.9* 7.7* 7.8*  MG  --   --   --   --  1.8  PHOS  --   --   --   --  2.8   Recent Labs  Lab 11/24/21 1535 11/27/21 1142 11/27/21 1719 11/28/21 0031  AST 38 40 42* 39  ALT 38 31 33 31  ALKPHOS 87 161* 146* 133*  BILITOT 0.6 0.4 0.7 1.0  PROT 5.8* 5.6* 5.4* 4.9*  ALBUMIN 2.6* 2.7* 2.6* 2.4*   Recent Labs  Lab 11/24/21 1535 11/27/21 1142 11/27/21 1142 11/27/21 1719 11/28/21 0031 11/28/21 1016 11/28/21 1447 11/29/21 0307 11/30/21 0406  WBC 52.5* 67.0*   < > 59.3* 55.3* 43.9* 39.7* 42.4* 40.8*  NEUTROABS 39.9* 53.2*  --  54.0*  --  33.7*  --   --   --   HGB 12.2* 11.9*   < > 12.2* 11.3* 10.8* 10.2* 10.3* 10.0*  HCT 35.1* 34.0*  --  34.6* 32.0* 30.2* 28.7* 29.3* 28.5*  MCV 88.9 88.5  --  89.4 88.2 88.3 88.6 89.3 90.5  PLT 34* 29*   < > 28* 31* 87* 79* 67* 66*   < > = values in this interval not displayed.   No results for input(s): CKTOTAL, CKMB, CKMBINDEX, TROPONINI in the last 168 hours. Recent Labs    11/27/21 1719 11/28/21 0031  LABPROT 19.0* 18.1*  INR 1.6* 1.5*   No results for input(s):  COLORURINE, LABSPEC, PHURINE, GLUCOSEU, HGBUR, BILIRUBINUR, KETONESUR, PROTEINUR, UROBILINOGEN, NITRITE, LEUKOCYTESUR in the last 72 hours.  Invalid input(s): APPERANCEUR     Component Value Date/Time   CHOL 173 11/28/2021 0031   TRIG 172 (H) 11/28/2021 0031   HDL 20 (L) 11/28/2021 0031   CHOLHDL 8.7 11/28/2021 0031   VLDL 34 11/28/2021 0031   LDLCALC 119 (H) 11/28/2021 0031   Lab Results  Component Value Date   HGBA1C 5.8 (H) 11/28/2021   No results found for: LABOPIA, COCAINSCRNUR, LABBENZ, AMPHETMU, THCU, LABBARB  No results for input(s): ETH in the last 168 hours.  I have personally reviewed the radiological images below and agree with the radiology interpretations.  DG Chest 2 View  Result Date: 11/27/2021 CLINICAL DATA:  Suspected sepsis EXAM: CHEST - 2 VIEW COMPARISON:  10/27/2021 FINDINGS: Heart is normal size. Bilateral airspace disease, most pronounced in the right lower lobe and left upper lobe. Small right pleural effusion. No acute bony abnormality. IMPRESSION: Bilateral airspace disease with small right effusion. Findings are concerning for multifocal  pneumonia. Electronically Signed   By: Rolm Baptise M.D.   On: 11/27/2021 18:14   MR ANGIO HEAD WO CONTRAST  Result Date: 11/28/2021 CLINICAL DATA:  Metastatic disease to brain.  Acute stroke. EXAM: MRA HEAD WITHOUT CONTRAST TECHNIQUE: Angiographic images of the Circle of Willis were acquired using MRA technique without intravenous contrast. COMPARISON:  MRI head 11/27/2021 FINDINGS: Anterior circulation: Internal carotid artery patent bilaterally. Moderate stenosis right A2 segment. Left anterior cerebral artery widely patent. Middle cerebral artery widely patent bilaterally. Posterior circulation: Left vertebral artery is widely patent supplies the basilar. Left PICA patent. Distal right vertebral artery not visualized and may be occluded. This appears to be a small vessel on the MRI. Basilar widely patent. Posterior cerebral  arteries patent without large vessel occlusion. Fetal origin right posterior cerebral artery. Anatomic variants: None Other: Negative for cerebral aneurysm. IMPRESSION: Moderate stenosis right anterior cerebral artery. Left anterior cerebral artery widely patent. Both middle cerebral arteries patent Loss of flow related signal distal right vertebral artery. This may be occluded. Electronically Signed   By: Franchot Gallo M.D.   On: 11/28/2021 13:35   MR BRAIN W WO CONTRAST  Result Date: 11/27/2021 CLINICAL DATA:  Brain metastasis. Brain/central nervous system neoplasm; assess for treatment response. EXAM: MRI HEAD WITHOUT AND WITH CONTRAST TECHNIQUE: Multiplanar, multiecho pulse sequences of the brain and surrounding structures were obtained without and with intravenous contrast. CONTRAST:  62m GADAVIST GADOBUTROL 1 MMOL/ML IV SOLN COMPARISON:  MR head 10/15/2021. FINDINGS: Motion artifact is present. Brain: The largest, likely extra-axial lesion along the right falx has decreased in size measuring approximately 1.2 x 0.6 cm (previously 2.1 x 1.2 cm). Left parasagittal frontal lesion measures approximately 5 mm (series 17, image 102) is slightly smaller (previously 6 mm). The small left parietal lesion is not definitely seen. Extensive edema in the right frontal lobe has nearly resolved. Edema in the anterior left frontal lobe has resolved. No definite new mass or abnormal enhancement. There are numerous foci of diffusion hyperintensity the cerebral hemispheres involving all but the temporal lobes as well as the cerebellar hemispheres and thalami. No hydrocephalus or extra-axial collection. Vascular: Major vessel flow voids at the skull base are preserved. Skull and upper cervical spine: Normal marrow signal is preserved. Sinuses/Orbits: Paranasal sinuses are aerated. Orbits are unremarkable. Other: Sella is unremarkable.  Mastoid air cells are clear. IMPRESSION: Motion degraded study. Positive treatment  response as detailed above. No definite new metastasis. Numerous small foci of acute to subacute infarcts involving multiple vascular territories suggestive of a central embolic source. These results will be called to the ordering clinician or representative by the Radiologist Assistant, and communication documented in the PACS or [employer] Electronically Signed   By: PMacy MisM.D.   On: 11/27/2021 13:05   CT ELBOW LEFT W CONTRAST  Result Date: 11/28/2021 CLINICAL DATA:  Soft tissue infection suspected, upper arm, xray done EXAM: CT OF THE UPPER LEFT EXTREMITY WITH CONTRAST TECHNIQUE: Multidetector CT imaging of the upper left extremity was performed according to the standard protocol following intravenous contrast administration. CONTRAST:  1575mOMNIPAQUE IOHEXOL 300 MG/ML  SOLN COMPARISON:  None. FINDINGS: Technical note: Examination is limited by beam hardening artifact related to patient's positioning of the elbow adjacent to the abdominal wall. Elbow is in flexion resulting in nonstandard imaging planes. There is also slight motion degradation affecting all series. Bones/Joint/Cartilage No acute fracture. No dislocation. No significant elbow joint effusion is seen. No bony destruction or periosteal elevation. Ligaments  Suboptimally assessed by CT. Muscles and Tendons Musculotendinous structures are poorly evaluated. No obvious intramuscular fluid collection. Soft tissues Fluid collection overlying the olecranon measuring approximately 3.3 x 1.2 x 3.0 cm with peripheral enhancement (series 8, image 119; series 11, image 71) may represent abscess or bursitis. There is prominent soft tissue edema and ill-defined fluid at the elbow and proximal to mid forearm. No soft tissue gas. IMPRESSION: 1. Limited exam. 2. Fluid collection overlying the olecranon measuring approximately 3.3 x 1.2 x 3.0 cm with peripheral enhancement may represent abscess or bursitis. 3. Prominent soft tissue edema and  ill-defined fluid at the elbow and proximal to mid forearm suggestive of cellulitis. No soft tissue gas. 4. No acute osseous abnormality. 5. No appreciable elbow joint effusion to suggest septic arthritis. Electronically Signed   By: Davina Poke D.O.   On: 11/28/2021 19:46   DG Chest Port 1 View  Result Date: 11/30/2021 CLINICAL DATA:  Shortness of breath. EXAM: PORTABLE CHEST 1 VIEW COMPARISON:  November 27, 2021. FINDINGS: Stable cardiomediastinal silhouette. Increased central pulmonary vascular congestion is noted as well as bilateral lung opacities concerning for edema. Increased right middle lobe opacity is noted concerning for pneumonia, atelectasis or possibly fluid within the right minor fissure. Bony thorax is unremarkable. IMPRESSION: Increased central pulmonary vascular congestion is noted with increased bilateral lung opacities concerning for edema. Increased right middle lobe opacity is noted concerning for pneumonia, atelectasis or possibly fluid within the right minor fissure. Electronically Signed   By: Marijo Conception M.D.   On: 11/30/2021 08:51   ECHOCARDIOGRAM COMPLETE BUBBLE STUDY  Result Date: 11/28/2021    ECHOCARDIOGRAM REPORT   Patient Name:   KADEEM HYLE Date of Exam: 11/28/2021 Medical Rec #:  [id number]         Height:       69.0 in Accession #:    [id number]        Weight:       203.7 lb Date of Birth:  12-Apr-1961         BSA:          2.082 m Patient Age:    60 years          BP:           137/84 mmHg Patient Gender: M                 HR:           114 bpm. Exam Location:  Inpatient Procedure: 2D Echo and Saline Contrast Bubble Study Indications:     stroke  History:         Patient has no prior history of Echocardiogram examinations.                  Lung cancer. sepsis.  Sonographer:     Johny Chess RDCS Referring Phys:  [id number] [place] Diagnosing Phys: Jenkins Rouge MD  Sonographer Comments: Image acquisition challenging due to respiratory motion.  IMPRESSIONS  1. Patient to return for more images to assess RWMAls and apex with definity . Left ventricular ejection fraction, by estimation, is 55%. The left ventricle has normal function. The left ventricle has no regional wall motion abnormalities. Left ventricular diastolic parameters were normal.  2. Right ventricular systolic function is normal. The right ventricular size is normal.  3. No evidence of tamponade . Small to moderate. The pericardial effusion is posterior to the left ventricle and anterior to the right ventricle. There is  no evidence of cardiac tamponade.  4. The mitral valve is grossly normal. No evidence of mitral valve regurgitation. No evidence of mitral stenosis.  5. The aortic valve is tricuspid. Aortic valve regurgitation is not visualized. No aortic stenosis is present.  6. The inferior vena cava is normal in size with greater than 50% respiratory variability, suggesting right atrial pressure of 3 mmHg.  7. Agitated saline contrast bubble study was negative, with no evidence of any interatrial shunt. FINDINGS  Left Ventricle: Patient to return for more images to assess RWMAls and apex with definity. Left ventricular ejection fraction, by estimation, is 55%. The left ventricle has normal function. The left ventricle has no regional wall motion abnormalities. The left ventricular internal cavity size was normal in size. There is no left ventricular hypertrophy. Left ventricular diastolic parameters were normal. Right Ventricle: The right ventricular size is normal. No increase in right ventricular wall thickness. Right ventricular systolic function is normal. Left Atrium: Left atrial size was normal in size. Right Atrium: Right atrial size was normal in size. Pericardium: No evidence of tamponade. Small to moderate. The pericardial effusion is posterior to the left ventricle and anterior to the right ventricle. There is no evidence of cardiac tamponade. Presence of epicardial fat layer.  Mitral Valve: The mitral valve is grossly normal. No evidence of mitral valve regurgitation. No evidence of mitral valve stenosis. Tricuspid Valve: The tricuspid valve is grossly normal. Tricuspid valve regurgitation is mild . No evidence of tricuspid stenosis. Aortic Valve: The aortic valve is tricuspid. Aortic valve regurgitation is not visualized. No aortic stenosis is present. Pulmonic Valve: The pulmonic valve was grossly normal. Pulmonic valve regurgitation is not visualized. No evidence of pulmonic stenosis. Aorta: The aortic root and ascending aorta are structurally normal, with no evidence of dilitation. Venous: The inferior vena cava is normal in size with greater than 50% respiratory variability, suggesting right atrial pressure of 3 mmHg. IAS/Shunts: The atrial septum is grossly normal. Agitated saline contrast was given intravenously to evaluate for intracardiac shunting. Agitated saline contrast bubble study was negative, with no evidence of any interatrial shunt.  LEFT VENTRICLE PLAX 2D LVIDd:         4.10 cm LVIDs:         2.60 cm LV PW:         1.10 cm LV IVS:        1.00 cm LVOT diam:     2.10 cm LVOT Area:     3.46 cm  RIGHT VENTRICLE         IVC TAPSE (M-mode): 2.7 cm  IVC diam: 2.00 cm LEFT ATRIUM             Index        RIGHT ATRIUM           Index LA diam:        3.40 cm 1.63 cm/m   RA Area:     16.90 cm LA Vol (A2C):   26.3 ml 12.63 ml/m  RA Volume:   39.50 ml  18.97 ml/m LA Vol (A4C):   32.4 ml 15.58 ml/m LA Biplane Vol: 29.2 ml 14.02 ml/m   AORTA Ao Root diam: 3.70 cm Ao Asc diam:  3.60 cm  SHUNTS Systemic Diam: 2.10 cm Jenkins Rouge MD Electronically signed by Jenkins Rouge MD Signature Date/Time: 11/28/2021/1:26:38 PM    Final (Updated)    VAS US CAROTID  Result Date: 11/30/2021 Carotid Arterial Duplex Study Patient Name:  Jaisen C  Anastasio Champion  Date of Exam:   11/28/2021 Medical Rec #: [id number]          Accession #:    [id number] Date of Birth: 08/21/1961          Patient Gender:  M Patient Age:   5 years Exam Location:  [facility] Procedure:      VAS US CAROTID Referring Phys: Alferd Patee [facility] --------------------------------------------------------------------------------  Indications:       CVA. Risk Factors:      Past history of smoking, prior MI. Comparison Study:  No prior study Performing Technologist: Maudry Mayhew MHA, RDMS, RVT, RDCS  Examination Guidelines: A complete evaluation includes B-mode imaging, spectral Doppler, color Doppler, and power Doppler as needed of all accessible portions of each vessel. Bilateral testing is considered an integral part of a complete examination. Limited examinations for reoccurring indications may be performed as noted.  Right Carotid Findings: +----------+--------+-------+--------+----------------------+------------------+           PSV cm/sEDV    StenosisPlaque Description    Comments                             cm/s                                                    +----------+--------+-------+--------+----------------------+------------------+ CCA Prox  103     24                                   intimal thickening +----------+--------+-------+--------+----------------------+------------------+ CCA Distal57      13                                   intimal thickening +----------+--------+-------+--------+----------------------+------------------+ ICA Prox  37      17             smooth and                                                                heterogenous                             +----------+--------+-------+--------+----------------------+------------------+ ICA Distal66      30                                                      +----------+--------+-------+--------+----------------------+------------------+ ECA       125     12             smooth and  heterogenous                              +----------+--------+-------+--------+----------------------+------------------+ +----------+--------+-------+----------------+-------------------+           PSV cm/sEDV cmsDescribe        Arm Pressure (mmHG) +----------+--------+-------+----------------+-------------------+ [id number]             Multiphasic, WNL                    +----------+--------+-------+----------------+-------------------+ +---------+--------+--+--------+-+---------+ VertebralPSV cm/s20EDV cm/s6Antegrade +---------+--------+--+--------+-+---------+  Left Carotid Findings: +----------+--------+-------+--------+----------------------+------------------+           PSV cm/sEDV    StenosisPlaque Description    Comments                             cm/s                                                    +----------+--------+-------+--------+----------------------+------------------+ CCA Prox  117     23                                   intimal thickening +----------+--------+-------+--------+----------------------+------------------+ CCA Distal58      17                                                      +----------+--------+-------+--------+----------------------+------------------+ ICA Prox  34      11             smooth and                                                                heterogenous                             +----------+--------+-------+--------+----------------------+------------------+ ICA Distal75      34                                                      +----------+--------+-------+--------+----------------------+------------------+ ECA       73      10                                                      +----------+--------+-------+--------+----------------------+------------------+ +----------+--------+--------+----------------+-------------------+           PSV cm/sEDV cm/sDescribe        Arm Pressure (mmHG)  +----------+--------+--------+----------------+-------------------+ [id number]             Multiphasic, WNL                    +----------+--------+--------+----------------+-------------------+ +---------+--------+--+--------+--+---------+  VertebralPSV cm/s63EDV cm/s32Antegrade +---------+--------+--+--------+--+---------+   Summary: Right Carotid: Velocities in the right ICA are consistent with a 1-39% stenosis. Left Carotid: Velocities in the left ICA are consistent with a 1-39% stenosis.               Heterogenous area of the left subclavian area noted; etiology               unknown. Vertebrals:  Bilateral vertebral arteries demonstrate antegrade flow. Subclavians: Normal flow hemodynamics were seen in bilateral subclavian              arteries. *See table(s) above for measurements and observations.  Electronically signed by Antony Contras MD on 11/30/2021 at 8:21:24 AM.    Final    VAS [place] LOWER EXTREMITY VENOUS (DVT) (ONLY [facility] & [facility])  Result Date: 11/29/2021  Lower Venous DVT Study Patient Name:  MYCAL CONDE  Date of Exam:   11/28/2021 Medical Rec #: [id number]          Accession #:    [id number] Date of Birth: 1961/11/27          Patient Gender: M Patient Age:   20 years Exam Location:  [facility] Procedure:      VAS [place] LOWER EXTREMITY VENOUS (DVT) Referring Phys: JOSHUA LONG --------------------------------------------------------------------------------  Indications: Edema, and ulceration.  Risk Factors: Cancer Metastatic non-small cell lung cancer. Comparison Study: No prior study Performing Technologist: Maudry Mayhew MHA, RDMS, RVT, RDCS  Examination Guidelines: A complete evaluation includes B-mode imaging, spectral Doppler, color Doppler, and power Doppler as needed of all accessible portions of each vessel. Bilateral testing is considered an integral part of a complete examination. Limited examinations for reoccurring indications may be performed as noted. The reflux  portion of the exam is performed with the patient in reverse Trendelenburg.  +---------+---------------+---------+-----------+----------+--------------+ RIGHT    CompressibilityPhasicitySpontaneityPropertiesThrombus Aging +---------+---------------+---------+-----------+----------+--------------+ CFV      Full           Yes      Yes                                 +---------+---------------+---------+-----------+----------+--------------+ SFJ      Full                                                        +---------+---------------+---------+-----------+----------+--------------+ FV Prox  Full                                                        +---------+---------------+---------+-----------+----------+--------------+ FV Mid   Full                                                        +---------+---------------+---------+-----------+----------+--------------+ FV DistalFull                                                        +---------+---------------+---------+-----------+----------+--------------+  PFV      Full                                                        +---------+---------------+---------+-----------+----------+--------------+ POP      Full           Yes      Yes                                 +---------+---------------+---------+-----------+----------+--------------+ PTV      Full                                                        +---------+---------------+---------+-----------+----------+--------------+ PERO     Full                                                        +---------+---------------+---------+-----------+----------+--------------+ Gastroc  None                    No                   Acute          +---------+---------------+---------+-----------+----------+--------------+   +---------+---------------+---------+-----------+----------+--------------+ LEFT      CompressibilityPhasicitySpontaneityPropertiesThrombus Aging +---------+---------------+---------+-----------+----------+--------------+ CFV      Full           Yes      Yes                                 +---------+---------------+---------+-----------+----------+--------------+ SFJ      Full                                                        +---------+---------------+---------+-----------+----------+--------------+ FV Prox  Full                                                        +---------+---------------+---------+-----------+----------+--------------+ FV Mid   Full                                                        +---------+---------------+---------+-----------+----------+--------------+ FV DistalFull                                                        +---------+---------------+---------+-----------+----------+--------------+  PFV      Full                                                        +---------+---------------+---------+-----------+----------+--------------+ POP      Full           Yes      Yes                                 +---------+---------------+---------+-----------+----------+--------------+ PTV      Full                                                        +---------+---------------+---------+-----------+----------+--------------+ PERO     Full                                                        +---------+---------------+---------+-----------+----------+--------------+ Gastroc  None                    No                   Acute          +---------+---------------+---------+-----------+----------+--------------+     Summary: RIGHT: - Findings consistent with acute deep vein thrombosis involving the right gastrocnemius veins. - No cystic structure found in the popliteal fossa.  LEFT: - Findings consistent with acute deep vein thrombosis involving the left gastrocnemius veins. - No cystic  structure found in the popliteal fossa.  *See table(s) above for measurements and observations. Electronically signed by Harold Barban MD on 11/29/2021 at 7:47:26 PM.    Final    ECHOCARDIOGRAM LIMITED  Result Date: 11/28/2021    ECHOCARDIOGRAM LIMITED REPORT   Patient Name:   DONYALE BERTHOLD Date of Exam: 11/28/2021 Medical Rec #:  [id number]         Height:       69.0 in Accession #:    [id number]        Weight:       203.7 lb Date of Birth:  July 01, 1961         BSA:          2.082 m Patient Age:    39 years          BP:           100/70 mmHg Patient Gender: M                 HR:           115 bpm. Exam Location:  Inpatient Procedure: Limited Echo Indications:    TIA Stroke Abnormal ECG  History:        Patient has prior history of Echocardiogram examinations.                 Stroke; Signs/Symptoms:Altered Mental Status.  Sonographer:    Johny Chess RDCS Referring Phys: [id number] [place]  1. Limited echo:  With definity Overall EF preserved distal septal apical and inferior apical hypokinesis No mural apical thormbus seen. FINDINGS  Additional Comments: Limited echo: With definity Overall EF preserved distal septal apical and inferior apical hypokinesis No mural apical thormbus seen. Jenkins Rouge MD Electronically signed by Jenkins Rouge MD Signature Date/Time: 11/28/2021/2:21:40 PM    Final      PHYSICAL EXAM  Temp:  [97.6 F (36.4 C)-98.2 F (36.8 C)] 98.2 F (36.8 C) (12/12 1200) Pulse Rate:  [105-116] 114 (12/12 1140) Resp:  [14-24] 24 (12/12 1140) BP: (98-151)/(70-98) 151/98 (12/12 1140) SpO2:  [88 %-96 %] 93 % (12/12 1140) Weight:  [94.2 kg] 94.2 kg (12/12 0500)  General - Well nourished, well developed, in no apparent distress.  Ophthalmologic - fundi not visualized due to noncooperation.  Cardiovascular - Regular rhythm and tachycardia.  Mental Status -  Level of arousal and orientation to time, place, and person were intact. Language including expression,  naming, repetition, comprehension was assessed and found intact. Fund of Knowledge was assessed and was intact.  Cranial Nerves II - XII - II - Visual field intact OU. III, IV, VI - Extraocular movements intact. V - Facial sensation intact bilaterally. VII - Facial movement intact bilaterally. VIII - Hearing & vestibular intact bilaterally. X - Palate elevates symmetrically. XI - Chin turning & shoulder shrug intact bilaterally. XII - Tongue protrusion intact.  Motor Strength - The patient's strength was normal in all extremities and pronator drift was absent.  Bulk was normal and fasciculations were absent.   Motor Tone - Muscle tone was assessed at the neck and appendages and was normal.  Reflexes - The patient's reflexes were symmetrical in all extremities and he had no pathological reflexes.  Sensory - Light touch, temperature/pinprick were assessed and were symmetrical.    Coordination - The patient had normal movements in the hands with no ataxia or dysmetria.  Tremor was absent.  Gait and Station - deferred.   ASSESSMENT/PLAN Mr. VI WHITESEL is a 60 y.o. 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 No tPA given due to outside window, thrombocytopenia.    Stroke:  bilateral anterior and posterior punctate/small infarcts consistent with embolic shower embolic secondary to hypercoagulable state from advanced malignancy, libman-sacks endocarditis, or DVT in the setting of PFO MRI  b/l anterior and posterior punctate/small infarcts consistent with embolic shower MRA distal R VA occlusion, right ACA stenosis. Carotid Doppler  unremarkable 2D Echo EF 55%, no LV thrombus, no PFO LE venous doppler b/l distal DVT LDL 119 HgbA1c 5.8 SCDs for VTE prophylaxis No antithrombotic prior to admission, now on No antithrombotic. OK with heparin IV and plavix from neuro standpoint.  He likely needs prolonged anticoagulation given hypercoagulable state and DVT. Recommend switch heparin IV to DOAC once appropriate.  Ongoing aggressive stroke risk factor management Therapy recommendations:  CIR Disposition:  pending  B/l DVT LE venous doppler showed b/l distal DVT Likely the cause of b/l LE swelling, weeping wound at forelegs  Likely due to hypercoagulable state from advance malignancy OK with heparin IV from neuro standpoint and then transition to po DOAC once appropriate  Elevated troponin Cardiology on board EF 55%, no LV thrombus or PFO On plavix per cardiology as long as platelet stable > 50.   Thrombocytopenia  Could be due to chemo therapy Platelet 29->28->31->79->67 On plavix per cardiology as long as platelet stable > 50.  Close montioring  ? Cellulitis/sepsis Leukocytosis, improving WBC 52.5->67->55.3->42.4->40.8 B/l foreleg swelling, redness with weeping wound, improving Wound consult placed Tachycardia  CCM on board On vanco cefepime has discontinued   Lung cancer with brain met Oncology following Undergoing chemo SRT for brain mets (b/l frontal lesions) On decadron   Hyperlipidemia Home meds:  none  LDL 119, goal < 70 Now on lipitor 40 Continue statin at discharge  Other Stroke Risk Factors Former Cigarette smoker, quit 8 weeks ago  Other Active Problems Tachycardia  Hyponatremia - monitoring  Hospital day # 3  Neurology will sign off. Please call with questions. Pt will follow up with stroke clinic NP at [facility] in about 4 weeks. Thanks for the consult.  Rosalin Hawking, MD PhD Stroke Neurology 11/30/2021 1:09 PM    To contact Stroke Continuity provider, please refer to [email or web address] After hours, contact General Neurology

## 2021-11-30 NOTE — Progress Notes (Signed)
ANTICOAGULATION CONSULT NOTE  Pharmacy Consult for Heparin Indication: VTE treatment  Allergies  Allergen Reactions   Iopamidol Hives and Itching    Isovue break through with prep, Hives and Itching  Patient had PET?CT scan with 125 mls of Isovue-300. Noted to have two raised itchy hives which self-resolved. Patient had PET/CT scan with contrast on 05/27/2014 while on prednisone prep (3 doses of 50 mg-13 hour prep) and noted to have two hives which self -resolved without treatment   Iodinated Diagnostic Agents Rash   Iodine-131 Rash    Patient Measurements: Height: 5\' 9"  (175.3 cm) Weight: 94.2 kg (207 lb 10.8 oz) IBW/kg (Calculated) : 70.7 Heparin Dosing Weight: 90 kg  Vital Signs: Temp: 97.4 F (36.3 C) (12/12 2105) Temp Source: Oral (12/12 2105) BP: 125/92 (12/12 2105) Pulse Rate: 113 (12/12 2105)  Labs: Recent Labs    11/27/21 2247 11/28/21 0031 11/28/21 1016 11/28/21 1100 11/28/21 1447 11/29/21 0307 11/29/21 1637 11/30/21 0406 11/30/21 1254 11/30/21 2044  HGB  --  11.3*   < >  --  10.2* 10.3*  --  10.0*  --   --   HCT  --  32.0*   < >  --  28.7* 29.3*  --  28.5*  --   --   PLT  --  31*   < >  --  79* 67*  --  66*  --   --   LABPROT  --  18.1*  --   --   --   --   --   --   --   --   INR  --  1.5*  --   --   --   --   --   --   --   --   HEPARINUNFRC  --   --   --   --   --   --    < > <0.10* 0.16* 0.24*  CREATININE  --  1.02  --   --   --   --   --  0.68  --   --   TROPONINIHS 2,425*  --   --  5,945* 6,802*  --   --   --   --   --    < > = values in this interval not displayed.     Estimated Creatinine Clearance: 112.6 mL/min (by C-G formula based on SCr of 0.68 mg/dL).      Assessment: 60 y.o. M presents with dyspnea and lower extremity weeping. Pt with lung cancer and known brain mets. Pt with b/l embolic strokes on MRI - neurology following. Also with possible STEMI (but Cardiology feels may be more stress-related cardiomyopathy) - unable to go to  emergent [facility] given severe thrombocytopenia so plan to treat medically for time being with heparin and ASA. LE dopplers also resulted positive for bilateral acute DVT. Pharmacy consulted for heparin dosing after platelets given 12/10. CCM, neurology, and cardiology aware of bleeding risks. Both heparin drip and asa were held 12/10 with plt drop to 28 despite transfusion.  Heparin level remains subtherapeutic, however up trending  Goal of Therapy:  Heparin level 0.3-0.5 units/ml Monitor platelets by anticoagulation protocol: Yes   Plan:  Increase heparin gtt to 1550 units/hr, no bolus Follow up AM level  Thank you Anette Guarneri, PharmD 11/30/2021 9:34 PM

## 2021-11-30 NOTE — Progress Notes (Addendum)
ANTICOAGULATION CONSULT NOTE  Pharmacy Consult for Heparin Indication: VTE treatment  Allergies  Allergen Reactions   Iopamidol Hives and Itching    Isovue break through with prep, Hives and Itching  Patient had PET?CT scan with 125 mls of Isovue-300. Noted to have two raised itchy hives which self-resolved. Patient had PET/CT scan with contrast on 05/27/2014 while on prednisone prep (3 doses of 50 mg-13 hour prep) and noted to have two hives which self -resolved without treatment   Iodinated Diagnostic Agents Rash   Iodine-131 Rash    Patient Measurements: Height: 5\' 9"  (175.3 cm) Weight: 90.9 kg (200 lb 6.4 oz) IBW/kg (Calculated) : 70.7 Heparin Dosing Weight: 90 kg  Vital Signs: Temp: 97.6 F (36.4 C) (12/12 0400) Temp Source: Oral (12/12 0400) BP: 131/87 (12/12 0000) Pulse Rate: 115 (12/12 0200)  Labs: Recent Labs    11/27/21 1719 11/27/21 1927 11/27/21 1955 11/27/21 2247 11/28/21 0031 11/28/21 1016 11/28/21 1100 11/28/21 1447 11/29/21 0307 11/29/21 1637 11/30/21 0406  HGB 12.2*  --   --   --  11.3*   < >  --  10.2* 10.3*  --  10.0*  HCT 34.6*  --   --   --  32.0*   < >  --  28.7* 29.3*  --  28.5*  PLT 28*  --   --   --  31*   < >  --  79* 67*  --  66*  LABPROT 19.0*  --   --   --  18.1*  --   --   --   --   --   --   INR 1.6*  --   --   --  1.5*  --   --   --   --   --   --   HEPARINUNFRC  --   --   --   --   --   --   --   --   --  <0.10* <0.10*  CREATININE 0.89  --  0.88  --  1.02  --   --   --   --   --   --   TROPONINIHS  --    < >  --  2,425*  --   --  7,672* 6,802*  --   --   --    < > = values in this interval not displayed.     Estimated Creatinine Clearance: 86.9 mL/min (by C-G formula based on SCr of 1.02 mg/dL).   Medical History: Past Medical History:  Diagnosis Date   Abnormal TSH 09/28/2017   Anemia 11/27/2021   Anxiety 07/03/2012   Depression    Difficulty sleeping 09/28/2017   Examination of participant in clinical trial 09/11/2012    History of immunotherapy 02/23/2016   Melanoma of skin 10/26/2011   Nicotine dependence 07/17/2015   Smoker    STEMI (ST elevation myocardial infarction) ([place]) 11/27/2021    Medications:  Scheduled:    stroke: mapping our early stages of recovery book   Does not apply Once   sodium chloride   Intravenous Once   atorvastatin  40 mg Oral Daily   Chlorhexidine Gluconate Cloth  6 each Topical Daily   dexamethasone  1 mg Oral Q breakfast   fluconazole  100 mg Oral Daily   folic acid  1 mg Oral Daily   metoprolol tartrate  12.5 mg Oral BID   nicotine  7 mg Transdermal Daily   pantoprazole  40 mg  Oral Daily   silver sulfADIAZINE   Topical BID   vitamin B-12  1,000 mcg Oral Daily   Infusions:   sodium chloride 10 mL/hr at 11/30/21 0000   ceFEPime (MAXIPIME) IV Stopped (11/29/21 2225)   heparin 1,050 Units/hr (11/30/21 0000)   vancomycin 1,500 mg (11/30/21 0342)    Assessment: 60 y.o. M presents with dyspnea and lower extremity weeping. Pt with lung cancer and known brain mets. Pt with b/l embolic strokes on MRI - neurology following. Also with possible STEMI (but Cardiology feels may be more stress-related cardiomyopathy) - unable to go to emergent [facility] given severe thrombocytopenia so plan to treat medically for time being with heparin and ASA. LE dopplers also resulted positive for bilateral acute DVT. Pharmacy consulted for heparin dosing after platelets given 12/10. CCM, neurology, and cardiology aware of bleeding risks. Both heparin drip and asa were held 12/10 with plt drop to 28 despite transfusion.  Heparin level remains undetectable (<0.1), on 1000 units/hr. Hg= 10, plt= 66  Goal of Therapy:  Heparin level 0.3-0.5 units/ml Monitor platelets by anticoagulation protocol: Yes   Plan:  Increase heparin drip to 1250 units/hr. No bolus with severe thrombocytopenia and recent CVA.  F/u heparin level in 6 hours Monitor daily CBC, f/u platelets closely and for any s/s  bleeding  Hildred Laser, PharmD Clinical Pharmacist **Pharmacist phone directory can now be found on [email or web address] ([email or web address]).  Listed under [place].

## 2021-11-30 NOTE — Progress Notes (Signed)
NAME:  Christopher Randall, MRN:  [id number], DOB:  December 14, 1961, LOS: 3 ADMISSION DATE:  11/27/2021, CONSULTATION DATE:  109 REFERRING MD:  Dr Rogers Blocker, CHIEF COMPLAINT: Lower extremity edema  History of Present Illness:  This is a 60 year old white male that presented to the emergency room from home with increasing shortness of breath and lower extremity edema with skin breakdown and associated erythema.  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  He had undergone 1 session of chemotherapy as well as radiation.  Over the past week the patient has had increasing confusion and word searching in addition to a ecchymotic rash that started on both ankles.  The swelling in his ankles is also gotten significantly worse over the last week.  He was seen in the oncology clinic today.  Ultimately he was referred to the emergency room for further evaluation of the legs.  Patient was also being worked up for the confusion.  MRI demonstrates a embolic CVA.   Pertinent  Medical History  Small cell Lung CA with mets to the brain CVA   Significant Hospital Events: Including procedures, antibiotic start and stop dates in addition to other pertinent events   Positive for left deep vein thrombosis   Interim History / Subjective:  Patient stated feeling better, remained afebrile White count is trending down  Objective   Blood pressure 123/80, pulse (!) 114, temperature 97.6 F (36.4 C), temperature source Oral, resp. rate (!) 21, height 5\' 9"  (1.753 m), weight 94.2 kg, SpO2 91 %.        Intake/Output Summary (Last 24 hours) at 11/30/2021 0958 Last data filed at 11/30/2021 0600 Gross per 24 hour  Intake 1048.52 ml  Output 525 ml  Net 523.52 ml   Filed Weights   11/28/21 0500 11/29/21 0500 11/30/21 0500  Weight: 92.4 kg 90.9 kg 94.2 kg   Physical exam: General: Chronically ill-appearing male, lying on the bed HEENT: [place]/AT, eyes anicteric.  moist mucus  membranes Neuro: Alert, awake following commands Chest: Bilateral basal crackles, no wheezes or rhonchi Heart: Regular rate and rhythm, no murmurs or gallops Abdomen: Soft, nontender, nondistended, bowel sounds present Skin: Bilateral lower extremity with multiple ecchymotic lesions, there is erythema extending up to lower leg bilaterally.  Left elbow looks swollen, red with few tender spots                  Assessment & Plan:  Bilateral lower extrem cellulitis Left elbow cellulitis/joint infection Wound care is following Continue broad-spectrum antibiotics Orthopedic consulted Cultures have been negative so far  Questionable bilateral multifocal pneumonia with acute hypoxic respiratory failure X-ray chest showing worsening bilateral infiltrates this could be related to pneumonia versus pulmonary edema Patient denies cough, he is requiring 2 to 3 L oxygen to maintain O2 sat above 90% He is on broad-spectrum antibiotics Continue gentle diuresis  Small Cell lung CA with mets to the brain Diagnosed in October 2022 As received 1 cycle of radiation and chemotherapy Outpatient follow-up with oncology  Inferior lateral acute ST elevation MI Appreciate cardiology input Echocardiogram showed wall motion abnormalities and hypokinesis of apex and distal septum Continue IV heparin infusion Started on Plavix, metoprolol Continue Lipitor  Thrombocytopenia S/p platelet transfusion Platelet counts remained stable now above 60 Continue to monitor platelets  Hyponatremia (hypervolemic hyponatremia) 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 Follow-up serum osmolarity and urinary sodium No need for intervention at this time  Deep vein  thrombosis of left gastrocnemius vein Currently on heparin drip  Acute bilateral multiple strokes, likely embolic origin Appreciate neurology input Continue secondary stroke prophylaxis  Best Practice (right click and "Reselect  all SmartList Selections" daily)   Diet/type: Regular consistency (see orders) DVT prophylaxis: systemic heparin GI prophylaxis: PPI Lines: N/A Foley:  N/A Code Status:  full code Last date of multidisciplinary goals of care discussion:  11/30/2021 patient and family was updated at bedside   Labs   CBC: Recent Labs  Lab 11/24/21 1535 11/27/21 1142 11/27/21 1142 11/27/21 1719 11/28/21 0031 11/28/21 1016 11/28/21 1447 11/29/21 0307 11/30/21 0406  WBC 52.5* 67.0*   < > 59.3* 55.3* 43.9* 39.7* 42.4* 40.8*  NEUTROABS 39.9* 53.2*  --  54.0*  --  33.7*  --   --   --   HGB 12.2* 11.9*   < > 12.2* 11.3* 10.8* 10.2* 10.3* 10.0*  HCT 35.1* 34.0*  --  34.6* 32.0* 30.2* 28.7* 29.3* 28.5*  MCV 88.9 88.5  --  89.4 88.2 88.3 88.6 89.3 90.5  PLT 34* 29*   < > 28* 31* 87* 79* 67* 66*   < > = values in this interval not displayed.    Basic Metabolic Panel: Recent Labs  Lab 11/27/21 1142 11/27/21 1719 11/27/21 1955 11/28/21 0031 11/30/21 0406  NA 120* 119* 120* 121* 122*  K 4.0 4.0 3.9 3.7 3.7  CL 83* 81* 83* 82* 88*  CO2 25 23 23  21* 23  GLUCOSE 93 93 88 90 97  BUN 18 17 16 17 17   CREATININE 0.82 0.89 0.88 1.02 0.68  CALCIUM 8.0* 8.1* 7.9* 7.7* 7.8*  MG  --   --   --   --  1.8  PHOS  --   --   --   --  2.8   GFR: Estimated Creatinine Clearance: 112.6 mL/min (by C-G formula based on SCr of 0.68 mg/dL). Recent Labs  Lab 11/27/21 1719 11/27/21 1955 11/27/21 2031 11/28/21 0031 11/28/21 1016 11/28/21 1447 11/29/21 0307 11/30/21 0406  PROCALCITON  --  0.80  --  1.05  --   --  1.13  --   WBC 59.3*  --   --  55.3* 43.9* 39.7* 42.4* 40.8*  LATICACIDVEN 1.9  --  1.7  --   --   --   --   --     Liver Function Tests: Recent Labs  Lab 11/24/21 1535 11/27/21 1142 11/27/21 1719 11/28/21 0031  AST 38 40 42* 39  ALT 38 31 33 31  ALKPHOS 87 161* 146* 133*  BILITOT 0.6 0.4 0.7 1.0  PROT 5.8* 5.6* 5.4* 4.9*  ALBUMIN 2.6* 2.7* 2.6* 2.4*   No results for input(s): LIPASE,  AMYLASE in the last 168 hours. Recent Labs  Lab 11/27/21 2247  AMMONIA 18    ABG No results found for: PHART, PCO2ART, PO2ART, HCO3, TCO2, ACIDBASEDEF, O2SAT   Coagulation Profile: Recent Labs  Lab 11/27/21 1719 11/28/21 0031  INR 1.6* 1.5*    Cardiac Enzymes: No results for input(s): CKTOTAL, CKMB, CKMBINDEX, TROPONINI in the last 168 hours.  HbA1C: Hgb A1c MFr Bld  Date/Time Value Ref Range Status  11/28/2021 12:31 AM 5.8 (H) 4.8 - 5.6 % Final    Comment:    (NOTE) Pre diabetes:          5.7%-6.4%  Diabetes:              >6.4%  Glycemic control for   <7.0% adults with diabetes  10/07/2021 04:25 AM 5.5 4.8 - 5.6 % Final    Comment:    (NOTE) Pre diabetes:          5.7%-6.4%  Diabetes:              >6.4%  Glycemic control for   <7.0% adults with diabetes     CBG: No results for input(s): GLUCAP in the last 168 hours.    Jacky Kindle MD [facility] See Amion for pager If no response to pager, please call [phone] until 7pm After 7pm, Please call E-link [phone]

## 2021-11-30 NOTE — Progress Notes (Signed)
Pharmacy Antibiotic Note  Christopher Randall is a 59 y.o. male admitted on 11/27/2021 with worsening rash/cellulitis.  Pharmacy has been consulted for vancomycin dosing.  Plan: Adjust vancomycin to 1000 mg IV q 12h (eAUC 488, Goal AUC 400-550, SCr used 0.8) Monitor renal function, clinical progression and ability to narrow Vancomycin levels as needed  Height: 5\' 9"  (175.3 cm) Weight: 94.2 kg (207 lb 10.8 oz) IBW/kg (Calculated) : 70.7  Temp (24hrs), Avg:97.8 F (36.6 C), Min:97.6 F (36.4 C), Max:98.4 F (36.9 C)  Recent Labs  Lab 11/27/21 1142 11/27/21 1719 11/27/21 1719 11/27/21 1955 11/27/21 2031 11/28/21 0031 11/28/21 1016 11/28/21 1447 11/29/21 0307 11/30/21 0406  WBC 67.0* 59.3*   < >  --   --  55.3* 43.9* 39.7* 42.4* 40.8*  CREATININE 0.82 0.89  --  0.88  --  1.02  --   --   --  0.68  LATICACIDVEN  --  1.9  --   --  1.7  --   --   --   --   --    < > = values in this interval not displayed.    Estimated Creatinine Clearance: 112.6 mL/min (by C-G formula based on SCr of 0.68 mg/dL).    Allergies  Allergen Reactions   Iopamidol Hives and Itching    Isovue break through with prep, Hives and Itching  Patient had PET?CT scan with 125 mls of Isovue-300. Noted to have two raised itchy hives which self-resolved. Patient had PET/CT scan with contrast on 05/27/2014 while on prednisone prep (3 doses of 50 mg-13 hour prep) and noted to have two hives which self -resolved without treatment   Iodinated Diagnostic Agents Rash   Iodine-131 Rash    12/9 cefepime >>12/12 12/10 vanc >> 12/10 fluconazole >>   12/9 BCx - ngtd  Bertis Ruddy, PharmD Clinical Pharmacist ED Pharmacist Phone # [phone] 11/30/2021 10:24 AM

## 2021-11-30 NOTE — Progress Notes (Signed)
Physical Therapy Treatment Patient Details Name: Christopher Randall MRN: [id number] DOB: January 28, 1961 Today's Date: 11/30/2021   History of Present Illness Pt is 60 yo male who presents with BLE swelling, confusion, and weakness. Pt with STEMI and MRI showed multiple embolic acute and subacute infarcts involving multiple vascular territories suggestive of central embolic source. Pt also with LE cellulitis.  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 Chemo x1 wk s/p radiosurgery of brain.  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 Pt also with B DVT. Other PMH: melanoma with brain mets and radiosurgery, anxiety/ depression.    PT Comments    Pt progressing well. Pt with improved overall strength compared to initial eval and was able to amb 120' with rolling walker and modA for walker management, directional cues, and safety. Pt with noted DOE, Spo2 88% on 2Lo2 via [place], HR inc to 139bpm. Pt remains to have noted cognitive deficits resulting in pt requiring 24/7 assist for safe function. Pt with poor memory, decreased insight to deficits, impulsive, and decreased comprehension of situation. Acute PT to cont to follow. Continue to recommend CIR to address both cognitive and functional deficits in addition to provide family education to allow for pt to have a safe return home with family.    Recommendations for follow up therapy are one component of a multi-disciplinary discharge planning process, led by the attending physician.  Recommendations may be updated based on patient status, additional functional criteria and insurance authorization.  Follow Up Recommendations  Acute inpatient rehab (3hours/day)     Assistance Recommended at Discharge Frequent or constant Supervision/Assistance  Equipment Recommendations  Rolling walker (2 wheels)    Recommendations for Other Services Rehab consult;OT consult     Precautions / Restrictions Precautions Precautions:  Fall Restrictions Weight Bearing Restrictions: No     Mobility  Bed Mobility Overal bed mobility: Needs Assistance Bed Mobility: Supine to Sit     Supine to sit: Supervision     General bed mobility comments: HOB elevated, no physical assist, supervision for safety and line management    Transfers Overall transfer level: Needs assistance Equipment used: Rolling walker (2 wheels) Transfers: Sit to/from Stand Sit to Stand: Min assist           General transfer comment: minA to power up, max verbal cues for safe hand placement, pt continuously pulling up on walker despite walker falling back on pt and pt returning to sit EOB, completed 3 stand total    Ambulation/Gait Ambulation/Gait assistance: Mod assist;+2 safety/equipment Gait Distance (Feet): 120 Feet Assistive device: Rolling walker (2 wheels) Gait Pattern/deviations: Step-through pattern;Decreased stride length Gait velocity: dec Gait velocity interpretation: <1.8 ft/sec, indicate of risk for recurrent falls   General Gait Details: pt requiring modA for walker management as pt pushing to far in front with significant increased dependency on UEs. Pt with increased L LE step length compared to R. Pt would stop freq asking for directions despite being told to follow person in front of him with his IV pole. Pt with noted SOB and bilat foot pain also causing pt to stop frequently. HR 139bpm, SpO2 88% on 2LO2 via [place], pt reports it was harder to breathe than normal for him during amb   Stairs             Wheelchair Mobility    Modified Rankin (Stroke Patients Only) Modified Rankin (Stroke Patients Only) Pre-Morbid Rankin Score: No symptoms Modified Rankin: Moderately severe disability     Balance  Overall balance assessment: Needs assistance Sitting-balance support: Feet unsupported;No upper extremity supported Sitting balance-Leahy Scale: Good     Standing balance support: Bilateral upper extremity  supported;Reliant on assistive device for balance;During functional activity Standing balance-Leahy Scale: Poor Standing balance comment: required external assist to maintain standing                            Cognition Arousal/Alertness: Awake/alert Behavior During Therapy: Restless Overall Cognitive Status: Impaired/Different from baseline Area of Impairment: Attention;Memory;Following commands;Safety/judgement;Awareness;Problem solving                   Current Attention Level: Sustained Memory: Decreased short-term memory Following Commands: Follows one step commands with increased time Safety/Judgement: Decreased awareness of safety;Decreased awareness of deficits Awareness: Emergent Problem Solving: Difficulty sequencing;Slow processing General Comments: pt remains confused with decreased understanding of therapy stating "I"m still trying to figure out what we are doing here." Most likely due to brain mets. Pt requiring constant verbal cues to stay on task. Pt aware he is changing rooms and was very anxious regarding "how much time do I have to get all my stuff packed up"        Exercises      General Comments General comments (skin integrity, edema, etc.): SpO2 >91% on 2LO2 via , dropped to 88% on 2Lo2 when amb, HR inc to 139bpm, once seated 121bpm      Pertinent Vitals/Pain Pain Assessment: No/denies pain Pain Location: pt reports no pain but then states pain in bilat feet when amb Pain Descriptors / Indicators: Sore Pain Intervention(s): Monitored during session    Home Living                          Prior Function            PT Goals (current goals can now be found in the care plan section) Progress towards PT goals: Progressing toward goals    Frequency    Min 4X/week      PT Plan Current plan remains appropriate    Co-evaluation              AM-PAC PT "6 Clicks" Mobility   Outcome Measure  Help needed  turning from your back to your side while in a flat bed without using bedrails?: None Help needed moving from lying on your back to sitting on the side of a flat bed without using bedrails?: A Little Help needed moving to and from a bed to a chair (including a wheelchair)?: A Lot Help needed standing up from a chair using your arms (e.g., wheelchair or bedside chair)?: A Lot Help needed to walk in hospital room?: A Lot Help needed climbing 3-5 steps with a railing? : A Lot 6 Click Score: 15    End of Session Equipment Utilized During Treatment: Oxygen Activity Tolerance: Patient tolerated treatment well Patient left: in chair;with call bell/phone within reach;with family/visitor present Nurse Communication: Mobility status PT Visit Diagnosis: Unsteadiness on feet (R26.81);Muscle weakness (generalized) (M62.81);Pain;Difficulty in walking, not elsewhere classified (R26.2) Pain - part of body: Ankle and joints of foot     Time: 3536-1443 PT Time Calculation (min) (ACUTE ONLY): 31 min  Charges:  $Gait Training: 23-37 mins                     Kittie Plater, PT, DPT Acute Rehabilitation Services Pager #: [phone] Office #:  [place] 11/30/2021, 2:32 PM

## 2021-11-30 NOTE — Progress Notes (Signed)
Cardiology Progress Note  Patient ID: BODHI MORADI MRN: [id number] DOB: 1961-09-27 Date of Encounter: 11/30/2021  Primary Cardiologist: None  Subjective   Chief Complaint: None. Confusion this AM.   HPI: Platelets above 50,000.  Neuro okay with Plavix and heparin.  ROS:  All other ROS reviewed and negative. Pertinent positives noted in the HPI.     Inpatient Medications  Scheduled Meds:   stroke: mapping our early stages of recovery book   Does not apply Once   sodium chloride   Intravenous Once   atorvastatin  40 mg Oral Daily   Chlorhexidine Gluconate Cloth  6 each Topical Daily   clopidogrel  75 mg Oral Daily   dexamethasone  1 mg Oral Q breakfast   fluconazole  100 mg Oral Daily   folic acid  1 mg Oral Daily   metoprolol tartrate  12.5 mg Oral BID   nicotine  7 mg Transdermal Daily   pantoprazole  40 mg Oral Daily   silver sulfADIAZINE   Topical BID   vitamin B-12  1,000 mcg Oral Daily   Continuous Infusions:  sodium chloride Stopped (11/30/21 0545)   heparin 1,050 Units/hr (11/30/21 0600)   vancomycin Stopped (11/30/21 0542)   PRN Meds: sodium chloride, acetaminophen **OR** acetaminophen, clonazePAM, HYDROcodone-acetaminophen, magic mouthwash w/lidocaine, ondansetron **OR** ondansetron (ZOFRAN) IV, polyvinyl alcohol   Vital Signs   Vitals:   11/30/21 0200 11/30/21 0400 11/30/21 0500 11/30/21 0800  BP:  123/80    Pulse: (!) 115 (!) 114    Resp: 17 (!) 21    Temp:  97.6 F (36.4 C)  97.6 F (36.4 C)  TempSrc:  Oral  Oral  SpO2: 91% 91%    Weight:   94.2 kg   Height:        Intake/Output Summary (Last 24 hours) at 11/30/2021 0948 Last data filed at 11/30/2021 0600 Gross per 24 hour  Intake 1048.52 ml  Output 525 ml  Net 523.52 ml   Last 3 Weights 11/30/2021 11/29/2021 11/28/2021  Weight (lbs) 207 lb 10.8 oz 200 lb 6.4 oz 203 lb 11.3 oz  Weight (kg) 94.2 kg 90.9 kg 92.4 kg      Telemetry  Overnight telemetry shows sinus tachycardia 110 to  120 bpm, which I personally reviewed.   ECG  The most recent ECG shows sinus tachycardia heart rate 119, inferior posterior infarct, age-indeterminate, ST elevations are improving, which I personally reviewed.   Physical Exam   Vitals:   11/30/21 0200 11/30/21 0400 11/30/21 0500 11/30/21 0800  BP:  123/80    Pulse: (!) 115 (!) 114    Resp: 17 (!) 21    Temp:  97.6 F (36.4 C)  97.6 F (36.4 C)  TempSrc:  Oral  Oral  SpO2: 91% 91%    Weight:   94.2 kg   Height:        Intake/Output Summary (Last 24 hours) at 11/30/2021 0948 Last data filed at 11/30/2021 0600 Gross per 24 hour  Intake 1048.52 ml  Output 525 ml  Net 523.52 ml    Last 3 Weights 11/30/2021 11/29/2021 11/28/2021  Weight (lbs) 207 lb 10.8 oz 200 lb 6.4 oz 203 lb 11.3 oz  Weight (kg) 94.2 kg 90.9 kg 92.4 kg    Body mass index is 30.67 kg/m.  General: Well nourished, well developed, in no acute distress Head: Atraumatic, normal size  Eyes: PEERLA, EOMI  Neck: Supple, no JVD Endocrine: No thryomegaly Cardiac: Normal S1, S2; RRR; no murmurs,  rubs, or gallops Lungs: Clear to auscultation bilaterally, no wheezing, rhonchi or rales  Abd: Soft, nontender, no hepatomegaly  Ext: No edema, pulses 2+, lower extremity wounds noted Musculoskeletal: No deformities, BUE and BLE strength normal and equal Skin: Warm and dry, no rashes   Neuro: Alert and oriented to person, place, time, and situation, CNII-XII grossly intact, no focal deficits  Psych: Normal mood and affect   Labs  High Sensitivity Troponin:   Recent Labs  Lab 11/27/21 1927 11/27/21 2247 11/28/21 1100 11/28/21 1447  TROPONINIHS 2,168* 2,425* 6,947* 6,802*     Cardiac EnzymesNo results for input(s): TROPONINI in the last 168 hours. No results for input(s): TROPIPOC in the last 168 hours.  Chemistry Recent Labs  Lab 11/27/21 1142 11/27/21 1142 11/27/21 1719 11/27/21 1955 11/28/21 0031 11/30/21 0406  NA 120*  --  119* 120* 121* 122*  K 4.0  --   4.0 3.9 3.7 3.7  CL 83*  --  81* 83* 82* 88*  CO2 25  --  23 23 21* 23  GLUCOSE 93  --  93 88 90 97  BUN 18  --  17 16 17 17   CREATININE 0.82   < > 0.89 0.88 1.02 0.68  CALCIUM 8.0*  --  8.1* 7.9* 7.7* 7.8*  PROT 5.6*  --  5.4*  --  4.9*  --   ALBUMIN 2.7*  --  2.6*  --  2.4*  --   AST 40  --  42*  --  39  --   ALT 31  --  33  --  31  --   ALKPHOS 161*  --  146*  --  133*  --   BILITOT 0.4  --  0.7  --  1.0  --   GFRNONAA >60   < > >60 >60 >60 >60  ANIONGAP 12  --  15 14 18* 11   < > = values in this interval not displayed.    Hematology Recent Labs  Lab 11/28/21 1447 11/29/21 0307 11/30/21 0406  WBC 39.7* 42.4* 40.8*  RBC 3.24* 3.28* 3.15*  HGB 10.2* 10.3* 10.0*  HCT 28.7* 29.3* 28.5*  MCV 88.6 89.3 90.5  MCH 31.5 31.4 31.7  MCHC 35.5 35.2 35.1  RDW 14.8 15.1 15.5  PLT 79* 67* 66*   BNP Recent Labs  Lab 11/27/21 2014  BNP 472.9*    DDimer No results for input(s): DDIMER in the last 168 hours.   Radiology  MR ANGIO HEAD WO CONTRAST  Result Date: 11/28/2021 CLINICAL DATA:  Metastatic disease to brain.  Acute stroke. EXAM: MRA HEAD WITHOUT CONTRAST TECHNIQUE: Angiographic images of the Circle of Willis were acquired using MRA technique without intravenous contrast. COMPARISON:  MRI head 11/27/2021 FINDINGS: Anterior circulation: Internal carotid artery patent bilaterally. Moderate stenosis right A2 segment. Left anterior cerebral artery widely patent. Middle cerebral artery widely patent bilaterally. Posterior circulation: Left vertebral artery is widely patent supplies the basilar. Left PICA patent. Distal right vertebral artery not visualized and may be occluded. This appears to be a small vessel on the MRI. Basilar widely patent. Posterior cerebral arteries patent without large vessel occlusion. Fetal origin right posterior cerebral artery. Anatomic variants: None Other: Negative for cerebral aneurysm. IMPRESSION: Moderate stenosis right anterior cerebral artery. Left  anterior cerebral artery widely patent. Both middle cerebral arteries patent Loss of flow related signal distal right vertebral artery. This may be occluded. Electronically Signed   By: Franchot Gallo M.D.   On: 11/28/2021  13:35   CT ELBOW LEFT W CONTRAST  Result Date: 11/28/2021 CLINICAL DATA:  Soft tissue infection suspected, upper arm, xray done EXAM: CT OF THE UPPER LEFT EXTREMITY WITH CONTRAST TECHNIQUE: Multidetector CT imaging of the upper left extremity was performed according to the standard protocol following intravenous contrast administration. CONTRAST:  155mL OMNIPAQUE IOHEXOL 300 MG/ML  SOLN COMPARISON:  None. FINDINGS: Technical note: Examination is limited by beam hardening artifact related to patient's positioning of the elbow adjacent to the abdominal wall. Elbow is in flexion resulting in nonstandard imaging planes. There is also slight motion degradation affecting all series. Bones/Joint/Cartilage No acute fracture. No dislocation. No significant elbow joint effusion is seen. No bony destruction or periosteal elevation. Ligaments Suboptimally assessed by CT. Muscles and Tendons Musculotendinous structures are poorly evaluated. No obvious intramuscular fluid collection. Soft tissues Fluid collection overlying the olecranon measuring approximately 3.3 x 1.2 x 3.0 cm with peripheral enhancement (series 8, image 119; series 11, image 71) may represent abscess or bursitis. There is prominent soft tissue edema and ill-defined fluid at the elbow and proximal to mid forearm. No soft tissue gas. IMPRESSION: 1. Limited exam. 2. Fluid collection overlying the olecranon measuring approximately 3.3 x 1.2 x 3.0 cm with peripheral enhancement may represent abscess or bursitis. 3. Prominent soft tissue edema and ill-defined fluid at the elbow and proximal to mid forearm suggestive of cellulitis. No soft tissue gas. 4. No acute osseous abnormality. 5. No appreciable elbow joint effusion to suggest septic  arthritis. Electronically Signed   By: Davina Poke D.O.   On: 11/28/2021 19:46   DG Chest Port 1 View  Result Date: 11/30/2021 CLINICAL DATA:  Shortness of breath. EXAM: PORTABLE CHEST 1 VIEW COMPARISON:  November 27, 2021. FINDINGS: Stable cardiomediastinal silhouette. Increased central pulmonary vascular congestion is noted as well as bilateral lung opacities concerning for edema. Increased right middle lobe opacity is noted concerning for pneumonia, atelectasis or possibly fluid within the right minor fissure. Bony thorax is unremarkable. IMPRESSION: Increased central pulmonary vascular congestion is noted with increased bilateral lung opacities concerning for edema. Increased right middle lobe opacity is noted concerning for pneumonia, atelectasis or possibly fluid within the right minor fissure. Electronically Signed   By: Marijo Conception M.D.   On: 11/30/2021 08:51   ECHOCARDIOGRAM COMPLETE BUBBLE STUDY  Result Date: 11/28/2021    ECHOCARDIOGRAM REPORT   Patient Name:   NITISH ROES Date of Exam: 11/28/2021 Medical Rec #:  [id number]         Height:       69.0 in Accession #:    [id number]        Weight:       203.7 lb Date of Birth:  12/07/1961         BSA:          2.082 m Patient Age:    26 years          BP:           137/84 mmHg Patient Gender: M                 HR:           114 bpm. Exam Location:  Inpatient Procedure: 2D Echo and Saline Contrast Bubble Study Indications:     stroke  History:         Patient has no prior history of Echocardiogram examinations.  Lung cancer. sepsis.  Sonographer:     Johny Chess RDCS Referring Phys:  [id number] [place] Diagnosing Phys: Jenkins Rouge MD  Sonographer Comments: Image acquisition challenging due to respiratory motion. IMPRESSIONS  1. Patient to return for more images to assess RWMAls and apex with definity . Left ventricular ejection fraction, by estimation, is 55%. The left ventricle has normal function. The  left ventricle has no regional wall motion abnormalities. Left ventricular diastolic parameters were normal.  2. Right ventricular systolic function is normal. The right ventricular size is normal.  3. No evidence of tamponade . Small to moderate. The pericardial effusion is posterior to the left ventricle and anterior to the right ventricle. There is no evidence of cardiac tamponade.  4. The mitral valve is grossly normal. No evidence of mitral valve regurgitation. No evidence of mitral stenosis.  5. The aortic valve is tricuspid. Aortic valve regurgitation is not visualized. No aortic stenosis is present.  6. The inferior vena cava is normal in size with greater than 50% respiratory variability, suggesting right atrial pressure of 3 mmHg.  7. Agitated saline contrast bubble study was negative, with no evidence of any interatrial shunt. FINDINGS  Left Ventricle: Patient to return for more images to assess RWMAls and apex with definity. Left ventricular ejection fraction, by estimation, is 55%. The left ventricle has normal function. The left ventricle has no regional wall motion abnormalities. The left ventricular internal cavity size was normal in size. There is no left ventricular hypertrophy. Left ventricular diastolic parameters were normal. Right Ventricle: The right ventricular size is normal. No increase in right ventricular wall thickness. Right ventricular systolic function is normal. Left Atrium: Left atrial size was normal in size. Right Atrium: Right atrial size was normal in size. Pericardium: No evidence of tamponade. Small to moderate. The pericardial effusion is posterior to the left ventricle and anterior to the right ventricle. There is no evidence of cardiac tamponade. Presence of epicardial fat layer. Mitral Valve: The mitral valve is grossly normal. No evidence of mitral valve regurgitation. No evidence of mitral valve stenosis. Tricuspid Valve: The tricuspid valve is grossly normal. Tricuspid  valve regurgitation is mild . No evidence of tricuspid stenosis. Aortic Valve: The aortic valve is tricuspid. Aortic valve regurgitation is not visualized. No aortic stenosis is present. Pulmonic Valve: The pulmonic valve was grossly normal. Pulmonic valve regurgitation is not visualized. No evidence of pulmonic stenosis. Aorta: The aortic root and ascending aorta are structurally normal, with no evidence of dilitation. Venous: The inferior vena cava is normal in size with greater than 50% respiratory variability, suggesting right atrial pressure of 3 mmHg. IAS/Shunts: The atrial septum is grossly normal. Agitated saline contrast was given intravenously to evaluate for intracardiac shunting. Agitated saline contrast bubble study was negative, with no evidence of any interatrial shunt.  LEFT VENTRICLE PLAX 2D LVIDd:         4.10 cm LVIDs:         2.60 cm LV PW:         1.10 cm LV IVS:        1.00 cm LVOT diam:     2.10 cm LVOT Area:     3.46 cm  RIGHT VENTRICLE         IVC TAPSE (M-mode): 2.7 cm  IVC diam: 2.00 cm LEFT ATRIUM             Index        RIGHT ATRIUM  Index LA diam:        3.40 cm 1.63 cm/m   RA Area:     16.90 cm LA Vol (A2C):   26.3 ml 12.63 ml/m  RA Volume:   39.50 ml  18.97 ml/m LA Vol (A4C):   32.4 ml 15.58 ml/m LA Biplane Vol: 29.2 ml 14.02 ml/m   AORTA Ao Root diam: 3.70 cm Ao Asc diam:  3.60 cm  SHUNTS Systemic Diam: 2.10 cm Jenkins Rouge MD Electronically signed by Jenkins Rouge MD Signature Date/Time: 11/28/2021/1:26:38 PM    Final (Updated)    ECHOCARDIOGRAM LIMITED  Result Date: 11/28/2021    ECHOCARDIOGRAM LIMITED REPORT   Patient Name:   JRUE JARRIEL Date of Exam: 11/28/2021 Medical Rec #:  [id number]         Height:       69.0 in Accession #:    [id number]        Weight:       203.7 lb Date of Birth:  Apr 09, 1961         BSA:          2.082 m Patient Age:    60 years          BP:           100/70 mmHg Patient Gender: M                 HR:           115 bpm. Exam  Location:  Inpatient Procedure: Limited Echo Indications:    TIA Stroke Abnormal ECG  History:        Patient has prior history of Echocardiogram examinations.                 Stroke; Signs/Symptoms:Altered Mental Status.  Sonographer:    Johny Chess RDCS Referring Phys: [id number] [place]  1. Limited echo: With definity Overall EF preserved distal septal apical and inferior apical hypokinesis No mural apical thormbus seen. FINDINGS  Additional Comments: Limited echo: With definity Overall EF preserved distal septal apical and inferior apical hypokinesis No mural apical thormbus seen. Jenkins Rouge MD Electronically signed by Jenkins Rouge MD Signature Date/Time: 11/28/2021/2:21:40 PM    Final     Cardiac Studies  TTE 11/28/2021  1. Limited echo: With definity Overall EF preserved distal septal apical  and inferior apical hypokinesis No mural apical thormbus seen.   Patient Profile  JEREMIE GIANGRANDE is a 60 y.o. 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  MRI shows concerns for new stroke.  EKG consistent with inferolateral ST elevation.  Cardiology was consulted for his abnormal EKG.  Assessment & Plan   #ST elevation myocardial infarction -Initially admitted with new stroke and confusion also with significant hyponatremia.  There was concern for multifocal pneumonia.  EKG demonstrated inferior and lateral ST elevation. -Platelets were 28,000 he was not a candidate for invasive angiography. -He has been admitted to the ICU.  Echocardiogram shows globally preserved LVEF with wall motion abnormality in the apex and distal septum. -Medical management was decided. -EKG actually shows Q waves and ST elevation is improving.  He has completed his infarct. -Platelets appear to be stable after transfusion. -Neurology okay with heparin drip.  He has been started on  this.  From a cardiovascular standpoint would complete 48 hours of heparin. -They are okay with Plavix.  As long  as platelets are above 50,000 would add this.  I have added Plavix 75 mg daily. -Continue Lipitor 80 mg daily. -Metoprolol tartrate 12.5 twice daily added yesterday.  We will continue this as long as blood pressure allows. -Overall given ongoing need for chemotherapy and significant thrombocytopenia and metabolic derangement on admission also with stroke we opted for medical management.  I discussed this with the family.  This is the best option.  It is reassuring that his LV function is globally preserved.  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  No clot seen on contrast study. -Highly suspect is source of stroke could have come from clot in the LV apex that is no longer there. -Anticoagulation is recommended for DVTs. -Do agree with anticoagulation moving forward as long as we are able.  I suspect a stroke could have come from the LV apex. -No signs of A. fib. -Close monitoring as would likely discontinue anticoagulation if platelets are less than 50,000. -DVTs found but no PFO.   #Hyponatremia -Unclear what is caused this.  He is euvolemic.  There is no evidence of congestive heart failure.  If anything he is volume down.  Would defer work-up of this to critical care medicine. -Suspect this could be related to his malignancy and a possible paraneoplastic process. -I wonder if this is contributing to his mental status.  #Metastatic non-small cell lung cancer #Brain mets #Thrombocytopenia #Ongoing need for chemotherapy -Best option to treat his cardiovascular condition given his current status with malignancy and what I suspect will be recurrent thrombocytopenia medical management have been decided.  #Pneumonia? #Lower extremity cellulitis -Antibiotics per primary team.  #LE DVT -DVT of bilateral gastrocnemius vein.  These do not necessarily merit treatment but given symptoms and pain  in his legs they do merit treatment. -Transition to [facility] per primary team.  For questions or updates, please contact [place] Please consult [email or web address] for contact info under   Time Spent with Patient: I have spent a total of 35 minutes with patient reviewing hospital notes, telemetry, EKGs, labs and examining the patient as well as establishing an assessment and plan that was discussed with the patient.  > 50% of time was spent in direct patient care.    Signed, Addison Naegeli. Audie Box, MD, [place]  11/30/2021 9:48 AM

## 2021-11-30 NOTE — Consult Note (Signed)
Reason for Consult:Left elbow pain Referring Physician: Jacky Kindle Time called: 0272 Time at bedside: [name] is an 60 y.o. male.  HPI: Christopher Randall was admitted on Friday with BLE cellulitis vs vasculitis. At the time he had noted some rough skin on his left elbow for a few days but while in the ED noted that it had swollen and become quite painful. It has stayed that way through the weekend and orthopedic surgery was consulted today. He denies any trauma or prior hx/o. He is RHD.  Past Medical History:  Diagnosis Date   Abnormal TSH 09/28/2017   Anemia 11/27/2021   Anxiety 07/03/2012   Depression    Difficulty sleeping 09/28/2017   Examination of participant in clinical trial 09/11/2012   History of immunotherapy 02/23/2016   Melanoma of skin 10/26/2011   Nicotine dependence 07/17/2015   Smoker    STEMI (ST elevation myocardial infarction) ([facility] [place]) 11/27/2021    Past Surgical History:  Procedure Laterality Date   BRONCHIAL BIOPSY  10/09/2021   Procedure: BRONCHIAL BIOPSIES;  Surgeon: [name], MD;  Location: [facility];  Service: Endoscopy;;   BRONCHIAL BRUSHINGS  10/09/2021   Procedure: BRONCHIAL BRUSHINGS;  Surgeon: [name], MD;  Location: [facility];  Service: Endoscopy;;   ENDOBRONCHIAL ULTRASOUND N/A 10/09/2021   Procedure: ENDOBRONCHIAL ULTRASOUND;  Surgeon: [name], MD;  Location: [facility];  Service: Endoscopy;  Laterality: N/A;   FINE NEEDLE ASPIRATION  10/09/2021   Procedure: FINE NEEDLE ASPIRATION (FNA) LINEAR;  Surgeon: [name], MD;  Location: [facility];  Service: Endoscopy;;   LEFT POPLITEAL MASS RESECTION  08/27/2011   pigmented villonodular synovitis, negative for malignancy   LYMPH GLAND EXCISION  07/12/2011   right axilla, consistent with melanoma   LYMPH NODE DISSECTION  08/27/2011   right, 0/17 nodes positive   VIDEO BRONCHOSCOPY N/A 10/09/2021   Procedure: VIDEO BRONCHOSCOPY WITHOUT FLUORO;   Surgeon: [name], MD;  Location[facility];  Service: Endoscopy;  Laterality: N/A;    Family History  Problem Relation Age of Onset   Hypertension Mother    CAD Mother    Lung cancer Father     Social History:  reports that he has quit smoking. His smoking use included cigarettes. He smoked an average of .25 packs per day. He has never used smokeless tobacco. He reports current alcohol use of about 6.0 standard drinks per week. He reports that he does not use drugs.  Allergies:  Allergies  Allergen Reactions   Iopamidol Hives and Itching    Isovue break through with prep, Hives and Itching  Patient had PET?CT scan with 125 mls of Isovue-300. Noted to have two raised itchy hives which self-resolved. Patient had PET/CT scan with contrast on 05/27/2014 while on prednisone prep (3 doses of 50 mg-13 hour prep) and noted to have two hives which self -resolved without treatment   Iodinated Diagnostic Agents Rash   Iodine-131 Rash    Medications: I have reviewed the patient's current medications.  Results for orders placed or performed during the hospital encounter of 11/27/21 (from the past 48 hour(s))  Troponin I (High Sensitivity)     Status: Abnormal   Collection Time: 11/28/21 11:00 AM  Result Value Ref Range   Troponin I (High Sensitivity) 6,947 (HH) <18 ng/L    Comment: CRITICAL VALUE NOTED.  VALUE IS CONSISTENT WITH PREVIOUSLY REPORTED AND CALLED VALUE. (NOTE) Elevated high sensitivity troponin I (hsTnI) values and significant  changes  across serial measurements may suggest ACS but many other  chronic and acute conditions are known to elevate hsTnI results.  Refer to the Links section for chest pain algorithms and additional  guidance. Performed at [facility], [place] [address], [place], [place] [address]   CBC     Status: Abnormal   Collection Time: 11/28/21  2:47 PM  Result Value Ref Range   WBC 39.7 (H) 4.0 - 10.5 K/uL   RBC 3.24 (L) 4.22 - 5.81  MIL/uL   Hemoglobin 10.2 (L) 13.0 - 17.0 g/dL   HCT 28.7 (L) 39.0 - 52.0 %   MCV 88.6 80.0 - 100.0 fL   MCH 31.5 26.0 - 34.0 pg   MCHC 35.5 30.0 - 36.0 g/dL   RDW 14.8 11.5 - 15.5 %   Platelets 79 (L) 150 - 400 K/uL    Comment: Immature Platelet Fraction may be clinically indicated, consider ordering this additional test [id number] CONSISTENT WITH PREVIOUS RESULT REPEATED TO VERIFY    nRBC 0.2 0.0 - 0.2 %    Comment: Performed at [facility], [place] [address], [place], [place] [address]  Troponin I (High Sensitivity)     Status: Abnormal   Collection Time: 11/28/21  2:47 PM  Result Value Ref Range   Troponin I (High Sensitivity) 6,802 (HH) <18 ng/L    Comment: CRITICAL VALUE NOTED.  VALUE IS CONSISTENT WITH PREVIOUSLY REPORTED AND CALLED VALUE. (NOTE) Elevated high sensitivity troponin I (hsTnI) values and significant  changes across serial measurements may suggest ACS but many other  chronic and acute conditions are known to elevate hsTnI results.  Refer to the Links section for chest pain algorithms and additional  guidance. Performed at [facility], [place] [address] [place], [place] [address]   Procalcitonin     Status: None   Collection Time: 11/29/21  3:07 AM  Result Value Ref Range   Procalcitonin 1.13 ng/mL    Comment:        Interpretation: PCT > 0.5 ng/mL and <= 2 ng/mL: Systemic infection (sepsis) is possible, but other conditions are known to elevate PCT as well. (NOTE)       Sepsis PCT Algorithm           Lower Respiratory Tract                                      Infection PCT Algorithm    ----------------------------     ----------------------------         PCT < 0.25 ng/mL                PCT < 0.10 ng/mL          Strongly encourage             Strongly discourage   discontinuation of antibiotics    initiation of antibiotics    ----------------------------     -----------------------------       PCT 0.25 - 0.50 ng/mL            PCT 0.10 - 0.25  ng/mL               OR       >80% decrease in PCT            Discourage initiation of  antibiotics      Encourage discontinuation           of antibiotics    ----------------------------     -----------------------------         PCT >= 0.50 ng/mL              PCT 0.26 - 0.50 ng/mL                AND       <80% decrease in PCT             Encourage initiation of                                             antibiotics       Encourage continuation           of antibiotics    ----------------------------     -----------------------------        PCT >= 0.50 ng/mL                  PCT > 0.50 ng/mL               AND         increase in PCT                  Strongly encourage                                      initiation of antibiotics    Strongly encourage escalation           of antibiotics                                     -----------------------------                                           PCT <= 0.25 ng/mL                                                 OR                                        > 80% decrease in PCT                                      Discontinue / Do not initiate                                             antibiotics  Performed at [facility], [place] [address] [place], [place] [address]   CBC     Status: Abnormal   Collection Time: 11/29/21  3:07 AM  Result Value Ref Range   WBC 42.4 (H) 4.0 - 10.5 K/uL   RBC 3.28 (L) 4.22 - 5.81 MIL/uL   Hemoglobin 10.3 (L) 13.0 - 17.0 g/dL   HCT 29.3 (L) 39.0 - 52.0 %   MCV 89.3 80.0 - 100.0 fL   MCH 31.4 26.0 - 34.0 pg   MCHC 35.2 30.0 - 36.0 g/dL   RDW 15.1 11.5 - 15.5 %   Platelets 67 (L) 150 - 400 K/uL    Comment: Immature Platelet Fraction may be clinically indicated, consider ordering this additional test [id number] CONSISTENT WITH PREVIOUS RESULT REPEATED TO VERIFY    nRBC 0.1 0.0 - 0.2 %    Comment: Performed at [facility], [place] [address]  [place], [place] [address]  Heparin level (unfractionated)     Status: Abnormal   Collection Time: 11/29/21  4:37 PM  Result Value Ref Range   Heparin Unfractionated <0.10 (L) 0.30 - 0.70 IU/mL    Comment: (NOTE) The clinical reportable range upper limit is being lowered to >1.10 to align with the FDA approved guidance for the current laboratory assay.  If heparin results are below expected values, and patient dosage has  been confirmed, suggest follow up testing of antithrombin III levels. Performed at [facility], [place] [address], [place], [place] [address]   CBC     Status: Abnormal   Collection Time: 11/30/21  4:06 AM  Result Value Ref Range   WBC 40.8 (H) 4.0 - 10.5 K/uL   RBC 3.15 (L) 4.22 - 5.81 MIL/uL   Hemoglobin 10.0 (L) 13.0 - 17.0 g/dL   HCT 28.5 (L) 39.0 - 52.0 %   MCV 90.5 80.0 - 100.0 fL   MCH 31.7 26.0 - 34.0 pg   MCHC 35.1 30.0 - 36.0 g/dL   RDW 15.5 11.5 - 15.5 %   Platelets 66 (L) 150 - 400 K/uL    Comment: Immature Platelet Fraction may be clinically indicated, consider ordering this additional test [id number] CONSISTENT WITH PREVIOUS RESULT REPEATED TO VERIFY    nRBC 0.2 0.0 - 0.2 %    Comment: Performed at [facility], [place] [address], [place], [place] [address]  Basic metabolic panel     Status: Abnormal   Collection Time: 11/30/21  4:06 AM  Result Value Ref Range   Sodium 122 (L) 135 - 145 mmol/L   Potassium 3.7 3.5 - 5.1 mmol/L   Chloride 88 (L) 98 - 111 mmol/L   CO2 23 22 - 32 mmol/L   Glucose, Bld 97 70 - 99 mg/dL    Comment: Glucose reference range applies only to samples taken after fasting for at least 8 hours.   BUN 17 6 - 20 mg/dL   Creatinine, Ser 0.68 0.61 - 1.24 mg/dL   Calcium 7.8 (L) 8.9 - 10.3 mg/dL   GFR, Estimated >60 >60 mL/min    Comment: (NOTE) Calculated using the CKD-EPI Creatinine Equation (2021)    Anion gap 11 5 - 15    Comment: Performed at [facility] [address] [place], [place] [address]   Magnesium     Status: None   Collection Time: 11/30/21  4:06 AM  Result Value Ref Range   Magnesium 1.8 1.7 - 2.4 mg/dL    Comment: Performed at [facility] [address] [place], [place] [address]  Phosphorus     Status: None   Collection Time: 11/30/21  4:06 AM  Result Value Ref Range   Phosphorus 2.8 2.5 - 4.6 mg/dL    Comment: Performed at [facility] [address] [place], [place] [address]  Heparin level (unfractionated)     Status: Abnormal   Collection Time: 11/30/21  4:06 AM  Result Value Ref Range   Heparin Unfractionated <0.10 (L) 0.30 - 0.70 IU/mL    Comment: (NOTE) The clinical reportable range upper limit is being lowered to >1.10 to align with the FDA approved guidance for the current laboratory assay.  If heparin results are below expected values, and patient dosage has  been confirmed, suggest follow up testing of antithrombin III levels. 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  Acute stroke. EXAM: MRA HEAD WITHOUT CONTRAST TECHNIQUE: Angiographic images of the Circle of Willis were acquired using MRA technique without intravenous contrast. COMPARISON:  MRI head 11/27/2021 FINDINGS: Anterior circulation: Internal carotid artery patent bilaterally. Moderate stenosis right A2 segment. Left anterior cerebral artery widely patent. Middle cerebral artery widely patent bilaterally. Posterior circulation: Left vertebral artery is widely patent supplies the basilar. Left PICA patent. Distal right vertebral artery not visualized and may be occluded. This appears to be a small vessel on the MRI. Basilar widely patent. Posterior cerebral arteries patent without large vessel occlusion. Fetal origin right posterior cerebral artery. Anatomic variants: None Other: Negative for cerebral aneurysm. IMPRESSION: Moderate stenosis right  anterior cerebral artery. Left anterior cerebral artery widely patent. Both middle cerebral arteries patent Loss of flow related signal distal right vertebral artery. This may be occluded. Electronically Signed   By: Franchot Gallo M.D.   On: 11/28/2021 13:35   CT ELBOW LEFT W CONTRAST  Result Date: 11/28/2021 CLINICAL DATA:  Soft tissue infection suspected, upper arm, xray done EXAM: CT OF THE UPPER LEFT EXTREMITY WITH CONTRAST TECHNIQUE: Multidetector CT imaging of the upper left extremity was performed according to the standard protocol following intravenous contrast administration. CONTRAST:  155mL OMNIPAQUE IOHEXOL 300 MG/ML  SOLN COMPARISON:  None. FINDINGS: Technical note: Examination is limited by beam hardening artifact related to patient's positioning of the elbow adjacent to the abdominal wall. Elbow is in flexion resulting in nonstandard imaging planes. There is also slight motion degradation affecting all series. Bones/Joint/Cartilage No acute fracture. No dislocation. No significant elbow joint effusion is seen. No bony destruction or periosteal elevation. Ligaments Suboptimally assessed by CT. Muscles and Tendons Musculotendinous structures are poorly evaluated. No obvious intramuscular fluid collection. Soft tissues Fluid collection overlying the olecranon measuring approximately 3.3 x 1.2 x 3.0 cm with peripheral enhancement (series 8, image 119; series 11, image 71) may represent abscess or bursitis. There is prominent soft tissue edema and ill-defined fluid at the elbow and proximal to mid forearm. No soft tissue gas. IMPRESSION: 1. Limited exam. 2. Fluid collection overlying the olecranon measuring approximately 3.3 x 1.2 x 3.0 cm with peripheral enhancement may represent abscess or bursitis. 3. Prominent soft tissue edema and ill-defined fluid at the elbow and proximal to mid forearm suggestive of cellulitis. No soft tissue gas. 4. No acute osseous abnormality. 5. No appreciable elbow joint  effusion to suggest septic arthritis. Electronically Signed   By: Davina Poke D.O.   On: 11/28/2021 19:46   DG Chest Port 1 View  Result Date: 11/30/2021 CLINICAL DATA:  Shortness of breath. EXAM: PORTABLE CHEST 1 VIEW COMPARISON:  November 27, 2021. FINDINGS: Stable cardiomediastinal silhouette. Increased central pulmonary vascular congestion is noted as well as bilateral lung opacities concerning for edema. Increased right middle lobe opacity is noted concerning for pneumonia, atelectasis or possibly fluid within the right minor fissure. Bony thorax is unremarkable. IMPRESSION: Increased central pulmonary vascular congestion is noted with increased bilateral lung opacities concerning for edema. Increased right middle lobe opacity is noted concerning for pneumonia, atelectasis or possibly fluid within the right minor fissure. Electronically Signed   By: Marijo Conception M.D.   On: 11/30/2021 08:51   ECHOCARDIOGRAM COMPLETE BUBBLE STUDY  Result Date: 11/28/2021    ECHOCARDIOGRAM REPORT   Patient Name:   Christopher Randall Date of Exam: 11/28/2021 Medical Rec #:  [id number]         Height:       69.0 in Accession #:    [id number]        Weight:       203.7 lb Date of Birth:  Jun 17, 1961         BSA:          2.082 m Patient Age:    16 years          BP:           137/84 mmHg Patient Gender: M                 HR:           114 bpm. Exam Location:  Inpatient Procedure: 2D Echo and Saline Contrast Bubble Study Indications:     stroke  History:         Patient has no prior history of Echocardiogram examinations.                  Lung cancer. sepsis.  Sonographer:     Johny Chess RDCS Referring Phys:  [id number] [place] Diagnosing Phys: Jenkins Rouge MD  Sonographer Comments: Image acquisition challenging due to respiratory motion. IMPRESSIONS  1. Patient to return for more images to assess RWMAls and apex with definity . Left ventricular ejection fraction, by estimation, is 55%. The left ventricle  has normal function. The left ventricle has no regional wall motion abnormalities. Left ventricular diastolic parameters were normal.  2. Right ventricular systolic function is normal. The right ventricular size is normal.  3. No evidence of tamponade . Small to moderate. The pericardial effusion is posterior to the left ventricle and anterior to the right ventricle. There is no evidence of cardiac tamponade.  4. The mitral valve is grossly normal. No evidence of mitral valve regurgitation. No evidence of mitral stenosis.  5. The aortic valve is tricuspid. Aortic valve regurgitation is not visualized. No aortic stenosis is present.  6. The inferior vena cava is normal in size with greater than 50% respiratory variability, suggesting right atrial pressure of 3 mmHg.  7. Agitated saline contrast bubble study was negative, with no evidence of any interatrial shunt. FINDINGS  Left Ventricle: Patient to return for more images to assess RWMAls and apex with definity. Left ventricular ejection fraction, by estimation, is 55%. The left ventricle has normal function. The left ventricle has no regional wall motion abnormalities. The left ventricular internal cavity size was normal in size. There is no left ventricular hypertrophy. Left ventricular diastolic parameters were normal. Right Ventricle: The right ventricular size is normal. No increase in right ventricular wall thickness. Right ventricular systolic function is normal. Left Atrium: Left atrial size was normal in  size. Right Atrium: Right atrial size was normal in size. Pericardium: No evidence of tamponade. Small to moderate. The pericardial effusion is posterior to the left ventricle and anterior to the right ventricle. There is no evidence of cardiac tamponade. Presence of epicardial fat layer. Mitral Valve: The mitral valve is grossly normal. No evidence of mitral valve regurgitation. No evidence of mitral valve stenosis. Tricuspid Valve: The tricuspid valve is  grossly normal. Tricuspid valve regurgitation is mild . No evidence of tricuspid stenosis. Aortic Valve: The aortic valve is tricuspid. Aortic valve regurgitation is not visualized. No aortic stenosis is present. Pulmonic Valve: The pulmonic valve was grossly normal. Pulmonic valve regurgitation is not visualized. No evidence of pulmonic stenosis. Aorta: The aortic root and ascending aorta are structurally normal, with no evidence of dilitation. Venous: The inferior vena cava is normal in size with greater than 50% respiratory variability, suggesting right atrial pressure of 3 mmHg. IAS/Shunts: The atrial septum is grossly normal. Agitated saline contrast was given intravenously to evaluate for intracardiac shunting. Agitated saline contrast bubble study was negative, with no evidence of any interatrial shunt.  LEFT VENTRICLE PLAX 2D LVIDd:         4.10 cm LVIDs:         2.60 cm LV PW:         1.10 cm LV IVS:        1.00 cm LVOT diam:     2.10 cm LVOT Area:     3.46 cm  RIGHT VENTRICLE         IVC TAPSE (M-mode): 2.7 cm  IVC diam: 2.00 cm LEFT ATRIUM             Index        RIGHT ATRIUM           Index LA diam:        3.40 cm 1.63 cm/m   RA Area:     16.90 cm LA Vol (A2C):   26.3 ml 12.63 ml/m  RA Volume:   39.50 ml  18.97 ml/m LA Vol (A4C):   32.4 ml 15.58 ml/m LA Biplane Vol: 29.2 ml 14.02 ml/m   AORTA Ao Root diam: 3.70 cm Ao Asc diam:  3.60 cm  SHUNTS Systemic Diam: 2.10 cm Jenkins Rouge MD Electronically signed by Jenkins Rouge MD Signature Date/Time: 11/28/2021/1:26:38 PM    Final (Updated)    ECHOCARDIOGRAM LIMITED  Result Date: 11/28/2021    ECHOCARDIOGRAM LIMITED REPORT   Patient Name:   Christopher Randall Date of Exam: 11/28/2021 Medical Rec #:  [id number]         Height:       69.0 in Accession #:    [id number]        Weight:       203.7 lb Date of Birth:  05/28/1961         BSA:          2.082 m Patient Age:    25 years          BP:           100/70 mmHg Patient Gender: M                 HR:            115 bpm. Exam Location:  Inpatient Procedure: Limited Echo Indications:    TIA Stroke Abnormal ECG  History:        Patient has prior history of Echocardiogram examinations.  Stroke; Signs/Symptoms:Altered Mental Status.  Sonographer:    Johny Chess RDCS Referring Phys: [id number] [place]  1. Limited echo: With definity Overall EF preserved distal septal apical and inferior apical hypokinesis No mural apical thormbus seen. FINDINGS  Additional Comments: Limited echo: With definity Overall EF preserved distal septal apical and inferior apical hypokinesis No mural apical thormbus seen. Jenkins Rouge MD Electronically signed by Jenkins Rouge MD Signature Date/Time: 11/28/2021/2:21:40 PM    Final     Review of Systems  HENT:  Negative for ear discharge, ear pain, hearing loss and tinnitus.   Eyes:  Negative for photophobia and pain.  Respiratory:  Negative for cough and shortness of breath.   Cardiovascular:  Negative for chest pain.  Gastrointestinal:  Negative for abdominal pain, nausea and vomiting.  Genitourinary:  Negative for dysuria, flank pain, frequency and urgency.  Musculoskeletal:  Positive for arthralgias (Left elbow). Negative for back pain, myalgias and neck pain.  Skin:  Positive for wound (BLE).  Neurological:  Negative for dizziness and headaches.  Hematological:  Does not bruise/bleed easily.  Psychiatric/Behavioral:  The patient is not nervous/anxious.   Blood pressure 98/80, pulse (!) 108, temperature 97.6 F (36.4 C), temperature source Oral, resp. rate 19, height 5\' 9"  (1.753 m), weight 94.2 kg, SpO2 93 %. Physical Exam Constitutional:      General: He is not in acute distress.    Appearance: He is well-developed. He is not diaphoretic.  HENT:     Head: Normocephalic and atraumatic.  Eyes:     General: No scleral icterus.       Right eye: No discharge.        Left eye: No discharge.     Conjunctiva/sclera: Conjunctivae normal.   Cardiovascular:     Rate and Rhythm: Normal rate and regular rhythm.  Pulmonary:     Effort: Pulmonary effort is normal. No respiratory distress.  Musculoskeletal:     Cervical back: Normal range of motion.     Comments: Left shoulder, elbow, wrist, digits- Excoriation over elbow which is also erythematous, boggy, and mod TTP, full painless ROM, no instability, no blocks to motion  Sens  Ax/R/M/U intact  Mot   Ax/ R/ PIN/ M/ AIN/ U intact  Rad 2+  Skin:    General: Skin is warm and dry.  Neurological:     Mental Status: He is alert.  Psychiatric:        Mood and Affect: Mood normal.        Behavior: Behavior normal.    Assessment/Plan: Left olecranon bursitis -- Likely septic. Will aspirate and send for C&S. Appropriate for antibiotic therapy at this point but if no improvement in the next 48h will need formal I&D.    Lisette Abu, PA-C Orthopedic Surgery [phone] 11/30/2021, 10:53 AM

## 2021-11-30 NOTE — Progress Notes (Signed)
Inpatient Rehabilitation Admissions Coordinator   I met at bedside with patient , spouse and brother. We discussed goals and expectations of a possible [facility] admit. They are in agreement. I await further progress with therapy today and then will begin insurance Auth with  [employer] for a [facility] admit.  Danne Baxter, RN, MSN Rehab Admissions Coordinator [phone] 11/30/2021 11:42 AM

## 2021-11-30 NOTE — Progress Notes (Signed)
ANTICOAGULATION CONSULT NOTE  Pharmacy Consult for Heparin Indication: VTE treatment  Allergies  Allergen Reactions   Iopamidol Hives and Itching    Isovue break through with prep, Hives and Itching  Patient had PET?CT scan with 125 mls of Isovue-300. Noted to have two raised itchy hives which self-resolved. Patient had PET/CT scan with contrast on 05/27/2014 while on prednisone prep (3 doses of 50 mg-13 hour prep) and noted to have two hives which self -resolved without treatment   Iodinated Diagnostic Agents Rash   Iodine-131 Rash    Patient Measurements: Height: 5\' 9"  (175.3 cm) Weight: 94.2 kg (207 lb 10.8 oz) IBW/kg (Calculated) : 70.7 Heparin Dosing Weight: 90 kg  Vital Signs: Temp: 98.2 F (36.8 C) (12/12 1200) Temp Source: Oral (12/12 1200) BP: 151/98 (12/12 1140) Pulse Rate: 114 (12/12 1140)  Labs: Recent Labs    11/27/21 1719 11/27/21 1927 11/27/21 1955 11/27/21 2247 11/28/21 0031 11/28/21 1016 11/28/21 1100 11/28/21 1447 11/29/21 0307 11/29/21 1637 11/30/21 0406 11/30/21 1254  HGB 12.2*  --   --   --  11.3*   < >  --  10.2* 10.3*  --  10.0*  --   HCT 34.6*  --   --   --  32.0*   < >  --  28.7* 29.3*  --  28.5*  --   PLT 28*  --   --   --  31*   < >  --  79* 67*  --  66*  --   LABPROT 19.0*  --   --   --  18.1*  --   --   --   --   --   --   --   INR 1.6*  --   --   --  1.5*  --   --   --   --   --   --   --   HEPARINUNFRC  --   --   --   --   --   --   --   --   --  <0.10* <0.10* 0.16*  CREATININE 0.89  --  0.88  --  1.02  --   --   --   --   --  0.68  --   TROPONINIHS  --    < >  --  2,425*  --   --  9,417* 6,802*  --   --   --   --    < > = values in this interval not displayed.     Estimated Creatinine Clearance: 112.6 mL/min (by C-G formula based on SCr of 0.68 mg/dL).   Medical History: Past Medical History:  Diagnosis Date   Abnormal TSH 09/28/2017   Anemia 11/27/2021   Anxiety 07/03/2012   Depression    Difficulty sleeping 09/28/2017    Examination of participant in clinical trial 09/11/2012   History of immunotherapy 02/23/2016   Melanoma of skin 10/26/2011   Nicotine dependence 07/17/2015   Smoker    STEMI (ST elevation myocardial infarction) ([place]) 11/27/2021    Medications:  Scheduled:    stroke: mapping our early stages of recovery book   Does not apply Once   sodium chloride   Intravenous Once   atorvastatin  40 mg Oral Daily   Chlorhexidine Gluconate Cloth  6 each Topical Daily   clopidogrel  75 mg Oral Daily   dexamethasone  1 mg Oral Q breakfast   fluconazole  100 mg Oral Daily  folic acid  1 mg Oral Daily   megestrol  160 mg Oral Daily   metoprolol tartrate  12.5 mg Oral BID   nicotine  7 mg Transdermal Daily   pantoprazole  40 mg Oral Daily   silver sulfADIAZINE   Topical BID   vitamin B-12  1,000 mcg Oral Daily   Infusions:   sodium chloride 10 mL/hr at 11/30/21 1200   heparin 1,250 Units/hr (11/30/21 1200)   [START ON 12/01/2021] vancomycin      Assessment: 60 y.o. M presents with dyspnea and lower extremity weeping. Pt with lung cancer and known brain mets. Pt with b/l embolic strokes on MRI - neurology following. Also with possible STEMI (but Cardiology feels may be more stress-related cardiomyopathy) - unable to go to emergent [facility] given severe thrombocytopenia so plan to treat medically for time being with heparin and ASA. LE dopplers also resulted positive for bilateral acute DVT. Pharmacy consulted for heparin dosing after platelets given 12/10. CCM, neurology, and cardiology aware of bleeding risks. Both heparin drip and asa were held 12/10 with plt drop to 28 despite transfusion.  Heparin level remains subtherapeutic, however uptrend from undetectable s/p increase to 1250 units/hr   Goal of Therapy:  Heparin level 0.3-0.5 units/ml Monitor platelets by anticoagulation protocol: Yes   Plan:  Increase heparin gtt to 1400 units/hr, no bolus F/u 6 hour heparin level  Bertis Ruddy,  PharmD Clinical Pharmacist ED Pharmacist Phone # [phone] 11/30/2021 1:52 PM

## 2021-12-01 ENCOUNTER — Ambulatory Visit: Payer: BC Managed Care – PPO | Admitting: Internal Medicine

## 2021-12-01 DIAGNOSIS — J189 Pneumonia, unspecified organism: Secondary | ICD-10-CM | POA: Diagnosis not present

## 2021-12-01 DIAGNOSIS — C3490 Malignant neoplasm of unspecified part of unspecified bronchus or lung: Secondary | ICD-10-CM | POA: Diagnosis not present

## 2021-12-01 DIAGNOSIS — I6340 Cerebral infarction due to embolism of unspecified cerebral artery: Secondary | ICD-10-CM | POA: Diagnosis not present

## 2021-12-01 DIAGNOSIS — M71122 Other infective bursitis, left elbow: Secondary | ICD-10-CM

## 2021-12-01 DIAGNOSIS — J9601 Acute respiratory failure with hypoxia: Secondary | ICD-10-CM | POA: Diagnosis not present

## 2021-12-01 DIAGNOSIS — M7022 Olecranon bursitis, left elbow: Secondary | ICD-10-CM

## 2021-12-01 DIAGNOSIS — I213 ST elevation (STEMI) myocardial infarction of unspecified site: Secondary | ICD-10-CM

## 2021-12-01 LAB — BASIC METABOLIC PANEL
Anion gap: 11 (ref 5–15)
BUN: 17 mg/dL (ref 6–20)
CO2: 26 mmol/L (ref 22–32)
Calcium: 7.8 mg/dL — ABNORMAL LOW (ref 8.9–10.3)
Chloride: 87 mmol/L — ABNORMAL LOW (ref 98–111)
Creatinine, Ser: 0.79 mg/dL (ref 0.61–1.24)
GFR, Estimated: >60 mL/min (ref >60)
Glucose, Bld: 131 mg/dL — ABNORMAL HIGH (ref 70–99)
Potassium: 3 mmol/L — ABNORMAL LOW (ref 3.5–5.1)
Sodium: 124 mmol/L — ABNORMAL LOW (ref 135–145)

## 2021-12-01 LAB — CBC
HCT: 28.8 % — ABNORMAL LOW (ref 39.0–52.0)
Hemoglobin: 9.8 g/dL — ABNORMAL LOW (ref 13.0–17.0)
MCH: 30.6 pg (ref 26.0–34.0)
MCHC: 34 g/dL (ref 30.0–36.0)
MCV: 90 fL (ref 80.0–100.0)
Platelets: 84 10*3/uL — ABNORMAL LOW (ref 150–400)
RBC: 3.2 MIL/uL — ABNORMAL LOW (ref 4.22–5.81)
RDW: 15.4 % (ref 11.5–15.5)
WBC: 41.6 10*3/uL — ABNORMAL HIGH (ref 4.0–10.5)
nRBC: 0.1 % (ref 0.0–0.2)

## 2021-12-01 LAB — HEPARIN LEVEL (UNFRACTIONATED)
Heparin Unfractionated: 0.31 IU/mL (ref 0.30–0.70)
Heparin Unfractionated: 0.28 IU/mL — ABNORMAL LOW (ref 0.30–0.70)
Heparin Unfractionated: 0.34 IU/mL (ref 0.30–0.70)

## 2021-12-01 MED ORDER — POTASSIUM CHLORIDE CRYS ER 20 MEQ PO TBCR
40.0000 meq | EXTENDED_RELEASE_TABLET | Freq: Once | ORAL | Status: AC
  Administered 2021-12-01: 40 meq via ORAL
  Filled 2021-12-01: qty 2

## 2021-12-01 NOTE — Progress Notes (Signed)
Cardiology Progress Note  Patient ID: Christopher Randall MRN: [id number] DOB: 03-10-1961 Date of Encounter: 12/01/2021  Primary Cardiologist: None  Subjective   Chief Complaint: SOB  HPI: Concerns for septic arthritis.  Possible washout by orthopedic surgery.  Denies chest pain or trouble breathing.  Remains on oxygen.  Chest CT pending.  ROS:  All other ROS reviewed and negative. Pertinent positives noted in the HPI.     Inpatient Medications  Scheduled Meds:  sodium chloride   Intravenous Once   atorvastatin  40 mg Oral Daily   Chlorhexidine Gluconate Cloth  6 each Topical Daily   dexamethasone  1 mg Oral Q breakfast   fluconazole  100 mg Oral Daily   folic acid  1 mg Oral Daily   megestrol  160 mg Oral Daily   metoprolol tartrate  12.5 mg Oral BID   nicotine  7 mg Transdermal Daily   pantoprazole  40 mg Oral Daily   silver sulfADIAZINE   Topical BID   vitamin B-12  1,000 mcg Oral Daily   Continuous Infusions:  sodium chloride 10 mL/hr at 11/30/21 1400   heparin 1,550 Units/hr (11/30/21 2152)   vancomycin 1,000 mg (12/01/21 0640)   PRN Meds: sodium chloride, acetaminophen **OR** acetaminophen, clonazePAM, HYDROcodone-acetaminophen, magic mouthwash w/lidocaine, ondansetron **OR** ondansetron (ZOFRAN) IV, polyvinyl alcohol   Vital Signs   Vitals:   11/30/21 2105 11/30/21 2336 12/01/21 0535 12/01/21 0828  BP: (!) 125/92 (!) 134/92 (!) 122/94 (!) 142/91  Pulse: (!) 113 (!) 111 (!) 113 (!) 110  Resp: 20 20 18 14   Temp: (!) 97.4 F (36.3 C) 97.9 F (36.6 C) (!) 97.5 F (36.4 C) 98.4 F (36.9 C)  TempSrc: Oral Oral Oral Oral  SpO2: 95% 95% 98% 97%  Weight:   94.1 kg   Height:        Intake/Output Summary (Last 24 hours) at 12/01/2021 1020 Last data filed at 11/30/2021 2337 Gross per 24 hour  Intake 374.62 ml  Output 1650 ml  Net -1275.38 ml   Last 3 Weights 12/01/2021 11/30/2021 11/29/2021  Weight (lbs) 207 lb 7.3 oz 207 lb 10.8 oz 200 lb 6.4 oz  Weight  (kg) 94.1 kg 94.2 kg 90.9 kg      Telemetry  Overnight telemetry shows sinus tachycardia heart rate 110s, which I personally reviewed.   Physical Exam   Vitals:   11/30/21 2105 11/30/21 2336 12/01/21 0535 12/01/21 0828  BP: (!) 125/92 (!) 134/92 (!) 122/94 (!) 142/91  Pulse: (!) 113 (!) 111 (!) 113 (!) 110  Resp: 20 20 18 14   Temp: (!) 97.4 F (36.3 C) 97.9 F (36.6 C) (!) 97.5 F (36.4 C) 98.4 F (36.9 C)  TempSrc: Oral Oral Oral Oral  SpO2: 95% 95% 98% 97%  Weight:   94.1 kg   Height:        Intake/Output Summary (Last 24 hours) at 12/01/2021 1020 Last data filed at 11/30/2021 2337 Gross per 24 hour  Intake 374.62 ml  Output 1650 ml  Net -1275.38 ml    Last 3 Weights 12/01/2021 11/30/2021 11/29/2021  Weight (lbs) 207 lb 7.3 oz 207 lb 10.8 oz 200 lb 6.4 oz  Weight (kg) 94.1 kg 94.2 kg 90.9 kg    Body mass index is 30.64 kg/m.   General: Ill-appearing Head: Atraumatic, normal size  Eyes: PEERLA, EOMI  Neck: Supple, no JVD Endocrine: No thryomegaly Cardiac: Normal S1, S2; tachycardia no murmurs Lungs: Diminished breath sounds bilaterally, rhonchi noted Abd: Soft, nontender,  no hepatomegaly  Ext: No edema, pulses 2+, lower extremities wrapped in dressings Musculoskeletal: No deformities, BUE and BLE strength normal and equal Skin: Warm and dry, no rashes   Neuro: Alert and oriented to person, place, time, and situation, CNII-XII grossly intact, no focal deficits  Psych: Normal mood and affect   Labs  High Sensitivity Troponin:   Recent Labs  Lab 11/27/21 1927 11/27/21 2247 11/28/21 1100 11/28/21 1447  TROPONINIHS 2,168* 2,425* 6,947* 6,802*     Cardiac EnzymesNo results for input(s): TROPONINI in the last 168 hours. No results for input(s): TROPIPOC in the last 168 hours.  Chemistry Recent Labs  Lab 11/27/21 1142 11/27/21 1719 11/27/21 1955 11/28/21 0031 11/30/21 0406 12/01/21 0717  NA 120* 119*   < > 121* 122* 124*  K 4.0 4.0   < > 3.7 3.7 3.0*   CL 83* 81*   < > 82* 88* 87*  CO2 25 23   < > 21* 23 26  GLUCOSE 93 93   < > 90 97 131*  BUN 18 17   < > 17 17 17   CREATININE 0.82 0.89   < > 1.02 0.68 0.79  CALCIUM 8.0* 8.1*   < > 7.7* 7.8* 7.8*  PROT 5.6* 5.4*  --  4.9*  --   --   ALBUMIN 2.7* 2.6*  --  2.4*  --   --   AST 40 42*  --  39  --   --   ALT 31 33  --  31  --   --   ALKPHOS 161* 146*  --  133*  --   --   BILITOT 0.4 0.7  --  1.0  --   --   GFRNONAA >60 >60   < > >60 >60 >60  ANIONGAP 12 15   < > 18* 11 11   < > = values in this interval not displayed.    Hematology Recent Labs  Lab 11/29/21 0307 11/30/21 0406 12/01/21 0207  WBC 42.4* 40.8* 41.6*  RBC 3.28* 3.15* 3.20*  HGB 10.3* 10.0* 9.8*  HCT 29.3* 28.5* 28.8*  MCV 89.3 90.5 90.0  MCH 31.4 31.7 30.6  MCHC 35.2 35.1 34.0  RDW 15.1 15.5 15.4  PLT 67* 66* 84*   BNP Recent Labs  Lab 11/27/21 2014  BNP 472.9*    DDimer No results for input(s): DDIMER in the last 168 hours.   Radiology  DG Chest Port 1 View  Result Date: 11/30/2021 CLINICAL DATA:  Shortness of breath. EXAM: PORTABLE CHEST 1 VIEW COMPARISON:  November 27, 2021. FINDINGS: Stable cardiomediastinal silhouette. Increased central pulmonary vascular congestion is noted as well as bilateral lung opacities concerning for edema. Increased right middle lobe opacity is noted concerning for pneumonia, atelectasis or possibly fluid within the right minor fissure. Bony thorax is unremarkable. IMPRESSION: Increased central pulmonary vascular congestion is noted with increased bilateral lung opacities concerning for edema. Increased right middle lobe opacity is noted concerning for pneumonia, atelectasis or possibly fluid within the right minor fissure. Electronically Signed   By: Marijo Conception M.D.   On: 11/30/2021 08:51    Cardiac Studies  TTE 11/28/2021  1. Limited echo: With definity Overall EF preserved distal septal apical  and inferior apical hypokinesis No mural apical thormbus seen.   Patient  Profile  Christopher Randall is a 60 y.o. 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  MRI shows concerns for new stroke.  EKG consistent with inferolateral ST elevation.  Cardiology was consulted for his abnormal EKG.  Assessment & Plan   #ST elevation myocardial infarction -Admitted with new stroke as well as significant hyponatremia and significant thrombocytopenia.  Also concern for multifocal pneumonia as well as lower extremity cellulitis. -EKG showed inferolateral ST elevation.  He was not a candidate for invasive angiography. -Given his metastatic non-small cell lung cancer with brain mets and new strokes I do not believe he is a great candidate for angiography either.  He also has significant thrombocytopenia.  This has been discussed extensively with the family.  Please see prior notes. -We have treated him medically.  He is on heparin due to DVT.  He has completed 48 hours of heparin and will transition to [employer] for DVT treatment.  Heparin for his MI has been completed. -Medical management was recommended.  Platelets are stable.  Plavix 75 mg daily was added.  I have held this as he may need ongoing procedures such as incision and drainage of septic arthritis.  Would recommend to restart this as you are able. -Suspect his tachycardia is related to infection.  Would continue metoprolol tartrate 12.5 mg twice daily. -Globally LV function is preserved.  Giving ongoing need for chemotherapy and undoubtably further thrombocytopenia would recommend medical management.  This way his medications can be stopped if needed. -Would also continue statin agent as you are able.  #Stroke #Apical akinesis with likely cardioembolic source -Akinetic apex secondary to ST elevation myocardial infarction.  Suspect this is likely the source of his stroke.  Continue heparin for now.  Would recommend  transition to [place] as you are able. -I discussed with his wife that his platelets are below 50,000 would recommend to stop anticoagulation.  Hematology can help guide the risk of bleeding.  Given his ongoing need for chemotherapy suspect he will develop thrombocytopenia in the future.  #Hypoxic respiratory failure #Suspect multifocal pneumonia -No evidence of volume overload. -Chest x-ray with multifocal pneumonia. -I believe his hypoxia is related to pneumonia.  Would recommend a noncontrast chest CT to help sort this out. -Echo shows normal LV function.  Normal atrial sizes.  He has a collapsing IVC.  No evidence of volume overload in my opinion.  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 -This is not hypervolemic hyponatremia.  This is not related to heart failure as he does not have heart failure.  #Metastatic non-small cell lung cancer #Brain mets #Thrombocytopenia #Ongoing need for chemotherapy -Medical management is the best option as detailed above. -Add back Plavix as you are able. -Continue beta-blocker. -Continue statin. -Not a candidate for invasive angiography.  #LE DVT -Heparin drip.  DOAC as you are able.  #Septic arthritis -Per orthopedics  [facility] will sign off.   Medication Recommendations: Transition back to Plavix as you are able.  Would hold for any procedures.  On heparin drip due to DVT.  Transition to [place] as you are able.  Continue beta-blocker and statin as above. Other recommendations (labs, testing, etc): None. Follow up as an outpatient: I will arrange outpatient follow-up with me in 4 to 6 weeks.  For questions or updates, please contact [place] Please consult [email or web address] for contact info under   Time Spent with Patient: I have spent a total of 25 minutes with patient reviewing hospital notes, telemetry, EKGs, labs and examining the patient as well as establishing an assessment and plan that  was discussed with  the patient.  > 50% of time was spent in direct patient care.    Signed, Addison Naegeli. Audie Box, MD, [place]  12/01/2021 10:20 AM

## 2021-12-01 NOTE — Progress Notes (Signed)
Inpatient Rehabilitation Admissions Coordinator   I have received insurance approval for CIR admit pending medical and surgical workup completion. Wait ortho follow up. I met with patient and his wife at bedside and they are aware.  Danne Baxter, RN, MSN Rehab Admissions Coordinator [phone] 12/01/2021 12:18 PM

## 2021-12-01 NOTE — Plan of Care (Signed)
  Problem: Education: Goal: Knowledge of disease or condition will improve Outcome: Progressing   Problem: Coping: Goal: Will verbalize positive feelings about self Outcome: Progressing   Problem: Health Behavior/Discharge Planning: Goal: Ability to manage health-related needs will improve Outcome: Progressing   Problem: Self-Care: Goal: Ability to participate in self-care as condition permits will improve Outcome: Progressing

## 2021-12-01 NOTE — Consult Note (Addendum)
[facility] for Infectious Disease    Date of Admission:  11/27/2021     Reason for Consult: Septic olecranon bursitis     Referring Physician: Dr Cruzita Lederer  Current antibiotics: Vancomycin 12/9--present   Previous antibiotics: Cefepime 12/9-12/11    ASSESSMENT:    60 y.o. male admitted with:  Left septic olecranon bursitis: Secondary to Staph aureus with susceptibilities pending.  Currently on vancomycin and further orthopedic surgery evaluation pending to determine need for washout in the OR. Bilateral anterior and posterior punctate/small infarcts with bilateral DVTs: Suspected cardioembolic source with akinetic apex secondary to STEMI.  Lower suspicion for infectious endocarditis given negative blood cultures and hypercoagulable state with malignancy. Bilateral leg swelling, weeping and erythema:  Markedly improved per patient and his wife.  Less likely to be cellulitis as a bilateral process would be unusual.  Petechial rash may have been more due to thrombocytopenia.  Hypoxic respiratory failure: Chest x-ray notes central pulmonary vascular congestion with increased bilateral opacities concerning for edema and right middle lobe opacity concerning for possible pneumonia, atelectasis, or possibly fluid within the right minor fissure.  CT chest without contrast pending for further evaluation.  No significant pulmonary symptoms at this time.  Metastatic non-small cell lung cancer with metastases to the brain. Thrombocytopenia, leukemoid reaction.   RECOMMENDATIONS:    Continue vancomycin per pharmacy Follow-up susceptibilities from left elbow aspiration Appreciate orthopedic surgery follow-up.  Anticipate he may need washout of his olecranon in the OR Follow up blood cultures Follow-up CT chest Will follow   Principal Problem:   Embolic stroke [facility]) Active Problems:   Anxiety, takes Ativan prn, mostly at night   Lung cancer metastatic to brain (HCC)    Hyponatremia   Thrombocytopenia (HCC)   Leukocytosis   Anemia   Acute respiratory failure (HCC)   Multifocal pneumonia   Sepsis (HCC)   Cellulitis   Confusion   Olecranon bursitis, left elbow   STEMI (ST elevation myocardial infarction) (HCC)   MEDICATIONS:    Scheduled Meds:  sodium chloride   Intravenous Once   atorvastatin  40 mg Oral Daily   Chlorhexidine Gluconate Cloth  6 each Topical Daily   dexamethasone  1 mg Oral Q breakfast   fluconazole  100 mg Oral Daily   folic acid  1 mg Oral Daily   megestrol  160 mg Oral Daily   metoprolol tartrate  12.5 mg Oral BID   nicotine  7 mg Transdermal Daily   pantoprazole  40 mg Oral Daily   potassium chloride  40 mEq Oral Once   silver sulfADIAZINE   Topical BID   vitamin B-12  1,000 mcg Oral Daily   Continuous Infusions:  sodium chloride 10 mL/hr at 11/30/21 1400   heparin 1,550 Units/hr (11/30/21 2152)   vancomycin 1,000 mg (12/01/21 0640)   PRN Meds:.sodium chloride, acetaminophen **OR** acetaminophen, clonazePAM, HYDROcodone-acetaminophen, magic mouthwash w/lidocaine, ondansetron **OR** ondansetron (ZOFRAN) IV, polyvinyl alcohol  HPI:    Christopher Randall is a 60 y.o. male with a past medical history of metastatic lung cancer with metastasis to the brain undergoing chemotherapy and XRT admitted with concern for an area of erythema and swelling of his left elbow that was painful concerning for infection.  He was diagnosed with a left olecranon bursitis and underwent aspiration with orthopedic surgery.  He was started on antibiotics.  Cultures from the aspiration are growing Staph aureus.  Peripheral blood cultures are no growth at this time.  He was noted to  have increasing shortness of breath and lower extremity swelling with weeping skin breakdown and erythema.  Patient also had increasing confusion.  He underwent MRI of the brain that showed bilateral embolic strokes.  He was also found to have bilateral lower extremity DVTs.   Echocardiogram did not reveal any obvious vegetations and bubble study was negative for interatrial shunt.  Neurology has evaluated the patient and feels that his embolic infarcts are secondary to hypercoagulable state from advanced malignancy versus Libman-Sacks marantic endocarditis.  Unfortunately, he was also found to have an EKG consistent with inferior lateral ST elevation for which cardiology was consulted.  He has been hypoxic with suspicion for multifocal pneumonia vs pulmonary edema.  A CT of the chest is pending at this time.  Regarding his left elbow infection, he had a CT scan a couple days ago that also showed a fluid collection overlying the olecranon suspicious for abscess or bursitis.  He has not been having any fevers or chills.  He has some swelling in his left distal arm.  He says his left elbow has swollen back up and is painful.    Past Medical History:  Diagnosis Date   Abnormal TSH 09/28/2017   Anemia 11/27/2021   Anxiety 07/03/2012   Depression    Difficulty sleeping 09/28/2017   Examination of participant in clinical trial 09/11/2012   History of immunotherapy 02/23/2016   Melanoma of skin 10/26/2011   Nicotine dependence 07/17/2015   Smoker    STEMI (ST elevation myocardial infarction) ([place]) 11/27/2021    Social History   Tobacco Use   Smoking status: Former    Packs/day: 0.25    Types: Cigarettes   Smokeless tobacco: Never  Vaping Use   Vaping Use: Never used  Substance Use Topics   Alcohol use: Yes    Alcohol/week: 6.0 standard drinks    Types: 6 Shots of liquor per week   Drug use: No    Family History  Problem Relation Age of Onset   Hypertension Mother    CAD Mother    Lung cancer Father     Allergies  Allergen Reactions   Iopamidol Hives and Itching    Isovue break through with prep, Hives and Itching  Patient had PET?CT scan with 125 mls of Isovue-300. Noted to have two raised itchy hives which self-resolved. Patient had PET/CT scan with  contrast on 05/27/2014 while on prednisone prep (3 doses of 50 mg-13 hour prep) and noted to have two hives which self -resolved without treatment   Iodinated Diagnostic Agents Rash   Iodine-131 Rash    Review of Systems  Constitutional:  Negative for chills and fever.  Cardiovascular:  Positive for leg swelling.  Gastrointestinal: Negative.   Musculoskeletal:  Positive for joint pain.  Neurological:  Positive for speech change.  All other systems reviewed and are negative.  OBJECTIVE:   Blood pressure (!) 142/91, pulse (!) 110, temperature 98.4 F (36.9 C), temperature source Oral, resp. rate 14, height 5\' 9"  (1.753 m), weight 94.1 kg, SpO2 97 %. Body mass index is 30.64 kg/m.  Physical Exam Constitutional:      General: He is not in acute distress.    Appearance: Normal appearance.  HENT:     Head: Normocephalic and atraumatic.  Eyes:     Extraocular Movements: Extraocular movements intact.     Conjunctiva/sclera: Conjunctivae normal.  Pulmonary:     Effort: Pulmonary effort is normal. No respiratory distress.     Comments: On 3 liters  via [place] Musculoskeletal:        General: Swelling present.     Right lower leg: Edema present.     Left lower leg: Edema present.     Comments: Left elbow with swelling and erythema.  Decreased ROM. Bilateral lower extremities with pedal edema and some petechial rash.  Overall improved.   Skin:    General: Skin is warm and dry.     Findings: Erythema present.  Neurological:     General: No focal deficit present.     Mental Status: He is alert and oriented to person, place, and time.     Cranial Nerves: No cranial nerve deficit.  Psychiatric:        Mood and Affect: Mood normal.        Behavior: Behavior normal.     Lab Results: Lab Results  Component Value Date   WBC 41.6 (H) 12/01/2021   HGB 9.8 (L) 12/01/2021   HCT 28.8 (L) 12/01/2021   MCV 90.0 12/01/2021   PLT 84 (L) 12/01/2021    Lab Results  Component Value Date   NA  124 (L) 12/01/2021   K 3.0 (L) 12/01/2021   CO2 26 12/01/2021   GLUCOSE 131 (H) 12/01/2021   BUN 17 12/01/2021   CREATININE 0.79 12/01/2021   CALCIUM 7.8 (L) 12/01/2021   GFRNONAA >60 12/01/2021    Lab Results  Component Value Date   ALT 31 11/28/2021   AST 39 11/28/2021   ALKPHOS 133 (H) 11/28/2021   BILITOT 1.0 11/28/2021       Component Value Date/Time   CRP 9.0 (H) 11/27/2021 1927       Component Value Date/Time   ESRSEDRATE 1 11/27/2021 1927    I have reviewed the micro and lab results in Epic.  Imaging: DG Chest Port 1 View  Result Date: 11/30/2021 CLINICAL DATA:  Shortness of breath. EXAM: PORTABLE CHEST 1 VIEW COMPARISON:  November 27, 2021. FINDINGS: Stable cardiomediastinal silhouette. Increased central pulmonary vascular congestion is noted as well as bilateral lung opacities concerning for edema. Increased right middle lobe opacity is noted concerning for pneumonia, atelectasis or possibly fluid within the right minor fissure. Bony thorax is unremarkable. IMPRESSION: Increased central pulmonary vascular congestion is noted with increased bilateral lung opacities concerning for edema. Increased right middle lobe opacity is noted concerning for pneumonia, atelectasis or possibly fluid within the right minor fissure. Electronically Signed   By: Marijo Conception M.D.   On: 11/30/2021 08:51     Imaging independently reviewed in Epic.  Rayn[facility] for Infectious [facility] [phone] pager 12/01/2021, 11:39 AM

## 2021-12-01 NOTE — Progress Notes (Signed)
PROGRESS NOTE  Christopher Randall:[id number] DOB: 12/27/1960 DOA: 11/27/2021 PCP: Fredirick Lathe, PA-C   LOS: 4 days   Brief Narrative / Interim history: 60 year old male presents to the ER with increasing shortness of breath and increasing lower extremity edema with skin breakdown and associated erythema.  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 Irene Limbo as an outpatient.  He just started chemotherapy and radiation therapy.  He also had worsening confusion and word searching difficulties.  He was seen in the oncology clinic and sent to the hospital.  He was initially admitted to the ICU, was found to have bilateral embolic strokes, STEMI.  He was transferred to the hospitalist service 12/13  Subjective / 24h Interval events: Frustrated this morning, overnight tried to go to the bathroom and bed alarm went on.  He denies any chest pain, denies any shortness of breath, no palpitations this morning.  He is worried about his elbow  Assessment & Plan: Principal Problem Bilateral anterior and posterior punctate/small infarcts - findings consistent with embolic shower possibly in the setting of hypercoagulable state due to advanced malignancy.  Neurology consulted and following.  MRA showed a distal right VA occlusion, right ACA stenosis.  Carotid Dopplers were unremarkable.  He underwent a 2D echo which showed an EF of 55%, no PFO.  Lower extremity Doppler showed bilateral distal DVTs.  LDL is 119 and he is on atorvastatin.  Neurology recommends anticoagulation, currently on IV heparin.  Therapy recommended CIR  Active Problems Left elbow septic bursitis, concern for bilateral lower extremity cellulitis-orthopedic surgery consulted, underwent fluid aspiration from the left elbow on 12/12.  Preliminary cultures show gram-positive cocci.  ID consulted today, appreciate Ortho follow-up.  May need to get washout in the OR.  Keep on IV heparin for  now, if he will not go to the OR could be transitioned to NOAC as per neurology recommendations  Inferolateral STEMI-cardiology consulted.  EKG showed inferolateral ST elevation.  2D echo reassuring as above.  Discussed with Dr. Davina Poke today, for now recommending medical management, he was not a candidate for invasive angiography due to thrombocytopenia as well as advanced malignancy.  They recommend Plavix however this is now on hold pending decision by orthopedic surgery for potential elbow washout in the OR   Questionable bilateral multifocal pneumonia with acute hypoxic respiratory failure -X-ray chest showing worsening bilateral infiltrates this could be related to pneumonia versus pulmonary edema. Patient denies cough, he is requiring 2 to 3 L oxygen to maintain O2 sat above 90%.  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 Outpatient follow-up with oncology   Thrombocytopenia, leukemoid reaction -likely in the setting of malignancy/infection.  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  Overall stable.  Clinically looks euvolemic/on the dry side.  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 -Currently on heparin drip  Hyperlipidemia-continue statin  Scheduled Meds:  sodium chloride   Intravenous Once   atorvastatin  40 mg Oral Daily   Chlorhexidine Gluconate Cloth  6 each Topical Daily   dexamethasone  1 mg Oral Q breakfast   fluconazole  100 mg Oral Daily   folic acid  1 mg Oral Daily   megestrol  160 mg Oral Daily   metoprolol tartrate  12.5 mg Oral BID   nicotine  7 mg Transdermal Daily   pantoprazole  40 mg Oral Daily   silver sulfADIAZINE   Topical  BID   vitamin B-12  1,000 mcg Oral Daily   Continuous Infusions:  sodium chloride 10 mL/hr at 11/30/21 1400   heparin 1,550 Units/hr (11/30/21 2152)   vancomycin 1,000 mg (12/01/21 0640)   PRN Meds:.sodium chloride, acetaminophen **OR** acetaminophen, clonazePAM, HYDROcodone-acetaminophen, magic mouthwash w/lidocaine, ondansetron **OR** ondansetron (ZOFRAN) IV, polyvinyl alcohol  Diet Orders (From admission, onward)     Start     Ordered   11/27/21 2058  Diet regular Room service appropriate? Yes; Fluid consistency: Thin  Diet effective now       Comments: Once passes stroke swallow screen  Question Answer Comment  Room service appropriate? Yes   Fluid consistency: Thin      11/27/21 2057            DVT prophylaxis:      Code Status: Full Code  Family Communication: no family at bedside  Status is: Inpatient  Remains inpatient appropriate because: Ongoing work-up  Level of care: Progressive  Consultants:  PCCM Neurology Cardiology Infectious disease Orthopedic surgery  Procedures:  2D echo  Microbiology  Elbow aspirate-GPC's  Antimicrobials: Vancomycin   Objective: Vitals:   11/30/21 2105 11/30/21 2336 12/01/21 0535 12/01/21 0828  BP: (!) 125/92 (!) 134/92 (!) 122/94 (!) 142/91  Pulse: (!) 113 (!) 111 (!) 113 (!) 110  Resp: 20 20 18 14   Temp: (!) 97.4 F (36.3 C) 97.9 F (36.6 C) (!) 97.5 F (36.4 C) 98.4 F (36.9 C)  TempSrc: Oral Oral Oral Oral  SpO2: 95% 95% 98% 97%  Weight:   94.1 kg   Height:        Intake/Output Summary (Last 24 hours) at 12/01/2021 1030 Last data filed at 11/30/2021 2337 Gross per 24 hour  Intake 374.62 ml  Output 1650 ml  Net -1275.38 ml   Filed Weights   11/29/21 0500 11/30/21 0500 12/01/21 0535  Weight: 90.9 kg 94.2 kg 94.1 kg    Examination:  Constitutional: NAD Eyes: no scleral icterus ENMT: Mucous membranes are moist.  Neck: normal, supple Respiratory: Faint rhonchi bilaterally, no wheezing.   Normal respiratory effort Cardiovascular: Regular rate and rhythm, no murmurs / rubs / gallops.  Trace edema Abdomen: non distended, no tenderness. Bowel sounds positive.  Musculoskeletal: no clubbing / cyanosis.  Skin: Lower extremity rash as below Neurologic: CN 2-12 grossly intact. Strength 5/5 in all 4.  Psychiatric: Normal judgment and insight. Alert and oriented x 3. Normal mood.    Data Reviewed: I have independently reviewed following labs and imaging studies          CBC: Recent Labs  Lab 11/24/21 1535 11/27/21 1142 11/27/21 1719 11/28/21 0031 11/28/21 1016 11/28/21 1447 11/29/21 0307 11/30/21 0406 12/01/21 0207  WBC 52.5* 67.0* 59.3*   < > 43.9* 39.7* 42.4* 40.8* 41.6*  NEUTROABS 39.9* 53.2* 54.0*  --  33.7*  --   --   --   --   HGB 12.2* 11.9* 12.2*   < > 10.8* 10.2* 10.3* 10.0* 9.8*  HCT 35.1* 34.0* 34.6*   < > 30.2* 28.7* 29.3* 28.5* 28.8*  MCV 88.9 88.5 89.4   < > 88.3 88.6 89.3 90.5 90.0  PLT 34* 29* 28*   < > 87* 79* 67* 66* 84*   < > =  values in this interval not displayed.   Basic Metabolic Panel: Recent Labs  Lab 11/27/21 1719 11/27/21 1955 11/28/21 0031 11/30/21 0406 12/01/21 0717  NA 119* 120* 121* 122* 124*  K 4.0 3.9 3.7 3.7 3.0*  CL 81* 83* 82* 88* 87*  CO2 23 23 21* 23 26  GLUCOSE 93 88 90 97 131*  BUN 17 16 17 17 17   CREATININE 0.89 0.88 1.02 0.68 0.79  CALCIUM 8.1* 7.9* 7.7* 7.8* 7.8*  MG  --   --   --  1.8  --   PHOS  --   --   --  2.8  --    Liver Function Tests: Recent Labs  Lab 11/24/21 1535 11/27/21 1142 11/27/21 1719 11/28/21 0031  AST 38 40 42* 39  ALT 38 31 33 31  ALKPHOS 87 161* 146* 133*  BILITOT 0.6 0.4 0.7 1.0  PROT 5.8* 5.6* 5.4* 4.9*  ALBUMIN 2.6* 2.7* 2.6* 2.4*   Coagulation Profile: Recent Labs  Lab 11/27/21 1719 11/28/21 0031  INR 1.6* 1.5*   HbA1C: No results for input(s): HGBA1C in the last 72 hours. CBG: No results for input(s): GLUCAP in the last 168 hours.  Recent Results (from the past  240 hour(s))  Culture, blood (Routine x 2)     Status: None (Preliminary result)   Collection Time: 11/27/21  5:35 PM   Specimen: BLOOD LEFT ARM  Result Value Ref Range Status   Specimen Description BLOOD LEFT ARM  Final   Special Requests   Final    BOTTLES DRAWN AEROBIC AND ANAEROBIC Blood Culture adequate volume   Culture   Final    NO GROWTH 4 DAYS Performed at [facility], [address], [place], [place] [address]    Report Status PENDING  Incomplete  Culture, blood (Routine x 2)     Status: None (Preliminary result)   Collection Time: 11/27/21 10:47 PM   Specimen: BLOOD  Result Value Ref Range Status   Specimen Description BLOOD LEFT ANTECUBITAL  Final   Special Requests   Final    BOTTLES DRAWN AEROBIC AND ANAEROBIC Blood Culture adequate volume   Culture   Final    NO GROWTH 4 DAYS Performed at [facility], [place] [address] [place], [place] [address]    Report Status PENDING  Incomplete  MRSA Next Gen by PCR, Nasal     Status: None   Collection Time: 11/28/21  4:17 AM   Specimen: Nasal Mucosa; Nasal Swab  Result Value Ref Range Status   MRSA by PCR Next Gen NOT DETECTED NOT DETECTED Final    Comment: (NOTE) The GeneXpert MRSA Assay (FDA approved for NASAL specimens only), is one component of a comprehensive MRSA colonization surveillance program. It is not intended to diagnose MRSA infection nor to guide or monitor treatment for MRSA infections. Test performance is not FDA approved in patients less than 93 years old. Performed at [facility], [place] [address] [place], [place] [address]   Body fluid culture w Gram Stain     Status: None (Preliminary result)   Collection Time: 11/30/21 10:51 AM   Specimen: Body Fluid  Result Value Ref Range Status   Specimen Description FLUID  Final   Special Requests LEFT ELBOW  Final   Gram Stain   Final    ABUNDANT WBC PRESENT,BOTH PMN AND MONONUCLEAR ABUNDANT GRAM POSITIVE COCCI    Culture   Final     CULTURE REINCUBATED FOR BETTER  GROWTH Performed at [facility], [place] [address], [place], [place] [address]    Report Status PENDING  Incomplete     Radiology Studies: No results found.  Marzetta Board, MD, PhD [facility]  Between 7 am - 7 pm I am available, please contact me via Amion (for emergencies) or Securechat (non urgent messages)  Between 7 pm - 7 am I am not available, please contact night coverage MD/APP via Amion

## 2021-12-01 NOTE — Progress Notes (Signed)
ANTICOAGULATION CONSULT NOTE  Pharmacy Consult for Heparin Indication:  VTE treatment  Allergies  Allergen Reactions   Iopamidol Hives and Itching    Isovue break through with prep, Hives and Itching  Patient had PET?CT scan with 125 mls of Isovue-300. Noted to have two raised itchy hives which self-resolved. Patient had PET/CT scan with contrast on 05/27/2014 while on prednisone prep (3 doses of 50 mg-13 hour prep) and noted to have two hives which self -resolved without treatment   Iodinated Diagnostic Agents Rash   Iodine-131 Rash    Patient Measurements: Height: 5\' 9"  (175.3 cm) Weight: 94.1 kg (207 lb 7.3 oz) IBW/kg (Calculated) : 70.7  Heparin Dosing Weight: 90 kg  Vital Signs: Temp: 98.4 F (36.9 C) (12/13 0828) Temp Source: Oral (12/13 0828) BP: 114/86 (12/13 1151) Pulse Rate: 106 (12/13 1151)  Labs: Recent Labs    11/28/21 1447 11/29/21 0307 11/29/21 1637 11/30/21 0406 11/30/21 1254 11/30/21 2044 12/01/21 0207 12/01/21 0717 12/01/21 0926  HGB 10.2* 10.3*  --  10.0*  --   --  9.8*  --   --   HCT 28.7* 29.3*  --  28.5*  --   --  28.8*  --   --   PLT 79* 67*  --  66*  --   --  84*  --   --   HEPARINUNFRC  --   --    < > <0.10*   < > 0.24* 0.31  --  0.28*  CREATININE  --   --   --  0.68  --   --   --  0.79  --   TROPONINIHS 6,802*  --   --   --   --   --   --   --   --    < > = values in this interval not displayed.    Estimated Creatinine Clearance: 112.6 mL/min (by C-G formula based on SCr of 0.79 mg/dL).   Assessment: 60 y.o. M presents with dyspnea and lower extremity weeping. Pt with lung cancer and known brain mets. Pt with b/l embolic strokes on MRI - neurology following. LE dopplers also resulted positive for bilateral acute DVT. Pharmacy consulted for heparin dosing after platelet transfusion given 12/10. CCM, neurology, and cardiology aware of bleeding risks. Both heparin gtt and ASA were held 12/10 with plt drop to 28 despite transfusion.    Heparin level subtherapeutic at 0.28, trending downwards from HL 0.31 previously. Hgb 9.8, plts up at 84. No issues with heparin infusion or s/sx bleeding per discussion with RN.      Goal of Therapy:  Heparin level 0.3-0.5 units/ml Monitor platelets by anticoagulation protocol: Yes  Plan:  Increase heparin infusion to 1650 units/hr Check heparin level in 6 hours and daily while on heparin Continue to monitor H&H and platelets    Thank you for allowing pharmacy to be a part of this patients care.  Ardyth Harps, PharmD Clinical Pharmacist

## 2021-12-01 NOTE — Progress Notes (Signed)
Occupational Therapy Treatment Patient Details Name: Christopher Randall MRN: [id number] DOB: 04/14/1961 Today's Date: 12/01/2021   History of present illness Pt is 60 yo male who presents with BLE swelling, confusion, and weakness. Pt with STEMI and MRI showed multiple embolic acute and subacute infarcts involving multiple vascular territories suggestive of central embolic source. Pt also with LE cellulitis.  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 Chemo x1 wk s/p radiosurgery of brain.  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 Pt also with B DVT. Other PMH: melanoma with brain mets and radiosurgery, anxiety/ depression.   OT comments  [name] is progressing well, session completed with PT for pt and therapist safety. Pt reported 7/10 pain in his elbow, encouraged full ROM exercises 10 repetitions/hour with L elbow, pt and his wife verbalized understanding. He was supervision for bed mobility but required verbal cues for safety/impulsivity. He tolerated ~5 minutes of ambulation without AD given 2 standing rest breaks. Overall pt was min A +2 for ambulation with 2 LOB which required heavy mod A +2 to correct. Sustained 3L O2 with SpO2 >91% entire session, HR up to 122 during ambulation. Pt demonstrates little insight to safety or his deficits despite LOB and education. He will continue to benefit from OT acutely. D/c recommendation remains appropriate.    Recommendations for follow up therapy are one component of a multi-disciplinary discharge planning process, led by the attending physician.  Recommendations may be updated based on patient status, additional functional criteria and insurance authorization.    Follow Up Recommendations  Acute inpatient rehab (3hours/day)    Assistance Recommended at Discharge Frequent or constant Supervision/Assistance  Equipment Recommendations  BSC/3in1;Tub/shower seat    Recommendations for Other Services      Precautions /  Restrictions Precautions Precautions: Fall Restrictions Weight Bearing Restrictions: No       Mobility Bed Mobility Overal bed mobility: Needs Assistance Bed Mobility: Supine to Sit     Supine to sit: Supervision     General bed mobility comments: bed flat, no use of rails    Transfers Overall transfer level: Needs assistance Equipment used: None Transfers: Sit to/from Stand Sit to Stand: Min guard;+2 safety/equipment                 Balance Overall balance assessment: Needs assistance Sitting-balance support: Feet unsupported;No upper extremity supported Sitting balance-Leahy Scale: Good     Standing balance support: No upper extremity supported;During functional activity Standing balance-Leahy Scale: Fair Standing balance comment: pt did have 2 LOB during ambulation that required mod A +2 to correct. static standing is min guard                           ADL either performed or assessed with clinical judgement   ADL Overall ADL's : Needs assistance/impaired                         Toilet Transfer: Minimal assistance;+2 for safety/equipment;Ambulation Toilet Transfer Details (indicate cue type and reason): min A for verbal cues and LOB         Functional mobility during ADLs: Minimal assistance;Moderate assistance;+2 for physical assistance;+2 for safety/equipment General ADL Comments: Session focused on incrased OOB activity tolerance. Pt tolerated ~5 minutes of amblation without AD, but did have 2 LOB that required mod A +2 to correct.    Extremity/Trunk Assessment Upper Extremity Assessment Upper Extremity Assessment: LUE deficits/detail LUE Deficits /  Details: L elbow is swollen, red and painful. ROM is WFL. Encourgaged stretching and ROM.   Lower Extremity Assessment Lower Extremity Assessment: Defer to PT evaluation        Vision   Vision Assessment?: Vision impaired- to be further tested in functional context Additional  Comments: questionable visison changes at the start of the session as pt's reported seeing 2 beds in his room. Assessed for duoble vision and testing was negative. No other reports of visual changes once OOB   Perception Perception Perception: Impaired   Praxis Praxis Praxis: Impaired    Cognition Arousal/Alertness: Awake/alert Behavior During Therapy: WFL for tasks assessed/performed;Impulsive Overall Cognitive Status: Impaired/Different from baseline Area of Impairment: Following commands;Safety/judgement;Awareness;Problem solving                   Current Attention Level: Sustained Memory: Decreased recall of precautions Following Commands: Follows one step commands consistently Safety/Judgement: Decreased awareness of safety;Decreased awareness of deficits Awareness: Emergent Problem Solving: Difficulty sequencing;Requires verbal cues General Comments: pt states he is confused, and reported that he sees a second bed in his room as he was pointing to his bed stating "so what is this." Tested for double vision, negative. Pt has poor insight to his deficits as he was asking about push ups adn other heavy exercises, and he had 2 LOB during ambulation and was seemingly unaware. Pt is easily frustrated.          Exercises     Shoulder Instructions       General Comments SpO2 >91% on 3L nasal canula    Pertinent Vitals/ Pain       Pain Assessment: 0-10 Pain Score: 6  Faces Pain Scale: Hurts little more Pain Location: L elbow & bilat LEs Pain Descriptors / Indicators: Sore Pain Intervention(s): Monitored during session  Home Living Family/patient expects to be discharged to:: Private residence Living Arrangements: Spouse/significant other                                      Prior Functioning/Environment              Frequency  Min 2X/week        Progress Toward Goals  OT Goals(current goals can now be found in the care plan section)   Progress towards OT goals: Progressing toward goals  Acute Rehab OT Goals Patient Stated Goal: to rehab soon OT Goal Formulation: With patient/family Time For Goal Achievement: 12/13/21 Potential to Achieve Goals: Good ADL Goals Pt Will Transfer to Toilet: with min guard assist;bedside commode Additional ADL Goal #1: Pt will perform OOB ADL tasks x5 mins with modA overall with intermittent seated rest breaks. Additional ADL Goal #2: Pt will increase to x2 mins of standing with minguardA for OOB ADL tasks and pain <3/10.  Plan Discharge plan remains appropriate    Co-evaluation    PT/OT/SLP Co-Evaluation/Treatment: Yes Reason for Co-Treatment: Complexity of the patient's impairments (multi-system involvement);For patient/therapist safety;To address functional/ADL transfers   OT goals addressed during session: ADL's and self-care;Proper use of Adaptive equipment and DME      AM-PAC OT "6 Clicks" Daily Activity     Outcome Measure   Help from another person eating meals?: A Little Help from another person taking care of personal grooming?: A Little Help from another person toileting, which includes using toliet, bedpan, or urinal?: A Little Help from another person bathing (including washing,  rinsing, drying)?: A Little Help from another person to put on and taking off regular upper body clothing?: A Little Help from another person to put on and taking off regular lower body clothing?: A Little 6 Click Score: 18    End of Session Equipment Utilized During Treatment: Gait belt;Oxygen  OT Visit Diagnosis: Unsteadiness on feet (R26.81);Muscle weakness (generalized) (M62.81);Pain;Other symptoms and signs involving cognitive function   Activity Tolerance Patient tolerated treatment well   Patient Left in chair;with call bell/phone within reach;with chair alarm set;with family/visitor present   Nurse Communication Mobility status        Time: 1103-1130 OT Time Calculation  (min): 27 min  Charges: OT General Charges $OT Visit: 1 Visit OT Treatments $Therapeutic Activity: 8-22 mins   Marlys Stegmaier A Jhon Mallozzi 12/01/2021, 12:13 PM

## 2021-12-01 NOTE — Progress Notes (Signed)
ANTICOAGULATION CONSULT NOTE  Pharmacy Consult for Heparin Indication:  VTE treatment  Allergies  Allergen Reactions   Iopamidol Hives and Itching    Isovue break through with prep, Hives and Itching  Patient had PET?CT scan with 125 mls of Isovue-300. Noted to have two raised itchy hives which self-resolved. Patient had PET/CT scan with contrast on 05/27/2014 while on prednisone prep (3 doses of 50 mg-13 hour prep) and noted to have two hives which self -resolved without treatment   Iodinated Diagnostic Agents Rash   Iodine-131 Rash    Patient Measurements: Height: 5\' 9"  (175.3 cm) Weight: 94.1 kg (207 lb 7.3 oz) IBW/kg (Calculated) : 70.7  Heparin Dosing Weight: 90 kg  Vital Signs: Temp: 98.2 F (36.8 C) (12/13 2006) Temp Source: Oral (12/13 2006) BP: 108/77 (12/13 2006) Pulse Rate: 106 (12/13 2006)  Labs: Recent Labs    11/29/21 0307 11/29/21 1637 11/30/21 0406 11/30/21 1254 12/01/21 0207 12/01/21 0717 12/01/21 0926 12/01/21 1943  HGB 10.3*  --  10.0*  --  9.8*  --   --   --   HCT 29.3*  --  28.5*  --  28.8*  --   --   --   PLT 67*  --  66*  --  84*  --   --   --   HEPARINUNFRC  --    < > <0.10*   < > 0.31  --  0.28* 0.34  CREATININE  --   --  0.68  --   --  0.79  --   --    < > = values in this interval not displayed.     Estimated Creatinine Clearance: 112.6 mL/min (by C-G formula based on SCr of 0.79 mg/dL).   Assessment: 60 y.o. M presents with dyspnea and lower extremity weeping. Pt with lung cancer and known brain mets. Pt with b/l embolic strokes on MRI - neurology following. LE dopplers also resulted positive for bilateral acute DVT. Pharmacy consulted for heparin dosing after platelet transfusion given 12/10. CCM, neurology, and cardiology aware of bleeding risks. Both heparin gtt and ASA were held 12/10 with plt drop to 28 despite transfusion.  PM heparin level therapeutic at 0.34   Goal of Therapy:  Heparin level 0.3-0.5 units/ml Monitor  platelets by anticoagulation protocol: Yes  Plan:  Continue heparin at 1650 units / hr Continue to monitor H&H and platelets    Thank you for allowing pharmacy to be a part of this patients care.  Anette Guarneri, PharmD Clinical Pharmacist

## 2021-12-01 NOTE — Progress Notes (Addendum)
Physical Therapy Treatment Patient Details Name: Christopher Randall MRN: [id number] DOB: Mar 14, 1961 Today's Date: 12/01/2021   History of Present Illness 60 year old male presents to the ER with increasing shortness of breath and increasing lower extremity edema with skin breakdown and associated erythema. 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 Irene Limbo as an outpatient.  He just started chemotherapy and radiation therapy. He also had worsening confusion and word searching difficulties. He was seen in the oncology clinic and sent to the hospital. He was initially admitted to the ICU, was found to have bilateral embolic strokes, STEMI. PMH: melanoma with brain mets and radiosurgery, anxiety/ depression.    PT Comments    Pt received in supine, eager to get OOB and participate in therapy session. Pt spouse present and encouraging. Pt with decreased insight into deficits and safety with mobility, RN notified he will need +1 constant assist for standing/OOB activity. Pt had x2 episodes of loss of balance during gait trial without AD needed up to +2 modA to regain balance with gait belt. SpO2 ~93% on 3L O2 [place] and DOE 3/4 during gait trial. Co-tx with OT due to pt multidisciplinary therapy needs and decreased safety/insight needing +2 physical assist to recover with loss of balance and third person pushing chair for safety. Pt pushed back to room in chair once fatigued. Pt continues to benefit from PT services to progress toward functional mobility goals.   Recommendations for follow up therapy are one component of a multi-disciplinary discharge planning process, led by the attending physician.  Recommendations may be updated based on patient status, additional functional criteria and insurance authorization.  Follow Up Recommendations  Acute inpatient rehab (3hours/day)     Assistance Recommended at Discharge Frequent or constant  Supervision/Assistance  Equipment Recommendations  Rolling walker (2 wheels) (pending progress)    Recommendations for Other Services       Precautions / Restrictions Precautions Precautions: Fall Precaution Comments: Droplet, c/f L elbow septic bursitis, monitor SpO2 Restrictions Weight Bearing Restrictions: No     Mobility  Bed Mobility Overal bed mobility: Needs Assistance Bed Mobility: Supine to Sit     Supine to sit: Supervision     General bed mobility comments: bed flat, no use of rails    Transfers Overall transfer level: Needs assistance Equipment used: None Transfers: Sit to/from Stand Sit to Stand: Min guard;+2 safety/equipment                Ambulation/Gait Ambulation/Gait assistance: Mod assist;+2 safety/equipment;Min assist Gait Distance (Feet): 175 Feet (including 3 standing breaks) Assistive device: None Gait Pattern/deviations: Step-through pattern;Decreased stride length;Scissoring;Staggering right;Narrow base of support Gait velocity: dec Gait velocity interpretation: <1.8 ft/sec, indicate of risk for recurrent falls   General Gait Details: Pt with narrow BOS and x2 episodes of scissoring causing LOB needing +2 modA to recover, otherwise minA with gait belt support +2 pt requesting no UE support but not aware of his balance deficits even after scissoring/LOB. Pt with noted DOE to 3/4 and bilat foot pain also causing pt to stop frequently. HR 110-120's bpm, SpO2 93% on 3L O2 via [place]    Modified Rankin (Stroke Patients Only) Modified Rankin (Stroke Patients Only) Pre-Morbid Rankin Score: No symptoms Modified Rankin: Moderately severe disability     Balance Overall balance assessment: Needs assistance Sitting-balance support: Feet unsupported;No upper extremity supported Sitting balance-Leahy Scale: Good     Standing balance support: No upper extremity supported;During functional activity Standing  balance-Leahy Scale: Fair Standing  balance comment: pt did have 2 LOB during ambulation that required mod A +2 to correct. static standing is min guard              Cognition Arousal/Alertness: Awake/alert Behavior During Therapy: WFL for tasks assessed/performed;Impulsive Overall Cognitive Status: Impaired/Different from baseline Area of Impairment: Following commands;Safety/judgement;Awareness;Problem solving       Current Attention Level: Sustained Memory: Decreased recall of precautions Following Commands: Follows one step commands consistently Safety/Judgement: Decreased awareness of safety;Decreased awareness of deficits Awareness: Emergent Problem Solving: Difficulty sequencing;Requires verbal cues General Comments: pt states he is confused, and reported that he sees a second bed in his room as he was pointing to his bed stating "so what is this." Tested for double vision, negative. Pt has poor insight to his deficits as he was asking about push ups adn other heavy exercises, and he had 2 LOB during ambulation and was seemingly unaware. Pt is easily frustrated.        Exercises Other Exercises Other Exercises: verbal review for supine/seated LE exercises, pt wanting to "get back to the push-ups" but encouraged him to start with low-level open chain exercises especially with UE due to MD concern for L elbow septic bursitis Other Exercises: reclined BLE AROM: ankle pumps, encouraged heel slides/hip abduction but pt only performed 1 rep then stopped. Will bring HEP next session.    General Comments General comments (skin integrity, edema, etc.): SpO2 >91% on 3L nasal canula      Pertinent Vitals/Pain Pain Assessment: 0-10 Pain Score: 6  Faces Pain Scale: Hurts little more Pain Location: L elbow & bilat LEs Pain Descriptors / Indicators: Sore Pain Intervention(s): Monitored during session    Home Living Family/patient expects to be discharged to:: Private residence Living Arrangements: Spouse/significant  other             PT Goals (current goals can now be found in the care plan section) Acute Rehab PT Goals Patient Stated Goal: get home, get back to activity like push-ups and gym exercises PT Goal Formulation: With patient/family Time For Goal Achievement: 12/12/21 Progress towards PT goals: Progressing toward goals    Frequency    Min 4X/week      PT Plan Current plan remains appropriate    Co-evaluation PT/OT/SLP Co-Evaluation/Treatment: Yes Reason for Co-Treatment: Complexity of the patient's impairments (multi-system involvement);Necessary to address cognition/behavior during functional activity;For patient/therapist safety;To address functional/ADL transfers PT goals addressed during session: Mobility/safety with mobility;Balance;Strengthening/ROM OT goals addressed during session: ADL's and self-care;Proper use of Adaptive equipment and DME      AM-PAC PT "6 Clicks" Mobility   Outcome Measure  Help needed turning from your back to your side while in a flat bed without using bedrails?: A Little Help needed moving from lying on your back to sitting on the side of a flat bed without using bedrails?: A Little Help needed moving to and from a bed to a chair (including a wheelchair)?: A Little Help needed standing up from a chair using your arms (e.g., wheelchair or bedside chair)?: A Lot (mod cues for safety) Help needed to walk in hospital room?: A Lot Help needed climbing 3-5 steps with a railing? : A Lot 6 Click Score: 15    End of Session Equipment Utilized During Treatment: Gait belt;Oxygen Activity Tolerance: Patient tolerated treatment well Patient left: in chair;with call bell/phone within reach;with chair alarm set Nurse Communication: Mobility status;Other (comment) (poor insight into deficits/high fall risk) PT Visit  Diagnosis: Unsteadiness on feet (R26.81);Muscle weakness (generalized) (M62.81);Pain;Difficulty in walking, not elsewhere classified  (R26.2) Pain - part of body: Ankle and joints of foot (elbow)     Time: 9471-2527 PT Time Calculation (min) (ACUTE ONLY): 26 min  Charges:  $Gait Training: 8-22 mins                     Welford Christmas P., PTA Acute Rehabilitation Services Pager: [phone] Office: [place] 12/01/2021, 1:21 PM

## 2021-12-01 NOTE — Progress Notes (Addendum)
SLP Cancellation Note  Patient Details Name: Christopher Randall MRN: [id number] DOB: 06-May-1961   Cancelled treatment:       Reason Eval/Treat Not Completed: Patient at procedure or test/unavailable  Unable to reassess cognitive linguistic function at this time, as pt is currently with other staff. Will continue efforts.   Michalla Ringer B. Quentin Ore, [facility], [place] Speech Language Pathologist Office: [phone]  Shonna Chock 12/01/2021, 12:00 PM

## 2021-12-01 NOTE — Progress Notes (Signed)
°  Transition of Care [facility]) Screening Note   Patient Details  Name: GURVEER COLUCCI Date of Birth: [birth year]   Transition of Care [facility]) CM/SW Contact:    Pollie Friar, RN Phone Number: 12/01/2021, 11:00 AM    Transition of Care Department [facility]) has reviewed patient. We will continue to monitor patient advancement through interdisciplinary progression rounds. If new patient transition needs arise, please place a TOC consult.

## 2021-12-01 NOTE — Progress Notes (Signed)
Pt c/o "not being able to move freely in room" per on-coming MD. Pt initially had a condom cath on at the beginning of my shift. During my shift he was able to get condom cath off. Pt also was assisted to chair in room, which he needed contact assist to transfer. By my assessment, pt is not able to freely move around independently. Nor did the pt mention not wanting his condom cath on until he stated he wanted to get up to use the rest room. And at that time he was allowed to use bedside commode or restroom.

## 2021-12-02 ENCOUNTER — Inpatient Hospital Stay (HOSPITAL_COMMUNITY): Payer: BC Managed Care – PPO

## 2021-12-02 ENCOUNTER — Ambulatory Visit (HOSPITAL_COMMUNITY): Payer: BC Managed Care – PPO

## 2021-12-02 ENCOUNTER — Other Ambulatory Visit (HOSPITAL_COMMUNITY): Payer: BC Managed Care – PPO

## 2021-12-02 LAB — COMPREHENSIVE METABOLIC PANEL
ALT: 49 U/L — ABNORMAL HIGH (ref 0–44)
AST: 45 U/L — ABNORMAL HIGH (ref 15–41)
Albumin: 1.8 g/dL — ABNORMAL LOW (ref 3.5–5.0)
Alkaline Phosphatase: 105 U/L (ref 38–126)
Anion gap: 9 (ref 5–15)
BUN: 14 mg/dL (ref 6–20)
CO2: 27 mmol/L (ref 22–32)
Calcium: 7.9 mg/dL — ABNORMAL LOW (ref 8.9–10.3)
Chloride: 90 mmol/L — ABNORMAL LOW (ref 98–111)
Creatinine, Ser: 0.72 mg/dL (ref 0.61–1.24)
GFR, Estimated: >60 mL/min (ref >60)
Glucose, Bld: 97 mg/dL (ref 70–99)
Potassium: 3.5 mmol/L (ref 3.5–5.1)
Sodium: 126 mmol/L — ABNORMAL LOW (ref 135–145)
Total Bilirubin: 0.4 mg/dL (ref 0.3–1.2)
Total Protein: 4.8 g/dL — ABNORMAL LOW (ref 6.5–8.1)

## 2021-12-02 LAB — CULTURE, BLOOD (ROUTINE X 2)
Culture: NO GROWTH
Culture: NO GROWTH
Special Requests: ADEQUATE
Special Requests: ADEQUATE

## 2021-12-02 LAB — CBC WITH DIFFERENTIAL/PLATELET
Abs Immature Granulocytes: 4.02 10*3/uL — ABNORMAL HIGH (ref 0.00–0.07)
Basophils Absolute: 0.1 10*3/uL (ref 0.0–0.1)
Basophils Relative: 0 %
Eosinophils Absolute: 0.1 10*3/uL (ref 0.0–0.5)
Eosinophils Relative: 0 %
HCT: 29.6 % — ABNORMAL LOW (ref 39.0–52.0)
Hemoglobin: 10.2 g/dL — ABNORMAL LOW (ref 13.0–17.0)
Immature Granulocytes: 10 %
Lymphocytes Relative: 3 %
Lymphs Abs: 1.4 10*3/uL (ref 0.7–4.0)
MCH: 31.2 pg (ref 26.0–34.0)
MCHC: 34.5 g/dL (ref 30.0–36.0)
MCV: 90.5 fL (ref 80.0–100.0)
Monocytes Absolute: 1.3 10*3/uL — ABNORMAL HIGH (ref 0.1–1.0)
Monocytes Relative: 3 %
Neutro Abs: 34.1 10*3/uL — ABNORMAL HIGH (ref 1.7–7.7)
Neutrophils Relative %: 84 %
Platelets: 89 10*3/uL — ABNORMAL LOW (ref 150–400)
RBC: 3.27 MIL/uL — ABNORMAL LOW (ref 4.22–5.81)
RDW: 15.6 % — ABNORMAL HIGH (ref 11.5–15.5)
Smear Review: DECREASED
WBC: 40.8 10*3/uL — ABNORMAL HIGH (ref 4.0–10.5)
nRBC: 0.1 % (ref 0.0–0.2)

## 2021-12-02 LAB — BODY FLUID CULTURE W GRAM STAIN

## 2021-12-02 LAB — HEPARIN LEVEL (UNFRACTIONATED): Heparin Unfractionated: 0.42 IU/mL (ref 0.30–0.70)

## 2021-12-02 LAB — MAGNESIUM: Magnesium: 1.8 mg/dL (ref 1.7–2.4)

## 2021-12-02 IMAGING — CT CT CHEST W/O CM
2 of 4 series · 13 of 36 positions shown, 16 images · non-contrast
Comparison: CT with IV contrast [DATE]

CLINICAL DATA: Suspected enlarging right hilar mass with multifocal
pneumonia/edema. Multifocal adenopathy on prior CT.

EXAM:
CT CHEST WITHOUT CONTRAST
TECHNIQUE: Multidetector CT imaging of the chest was performed following the
standard protocol without IV contrast.

[Series 3: chest wo · axial · 0.73mm/px · z∈[+962,+1248]mm · 10 of 170 slices shown, 13 images]
[im 14/170  mediastinal]
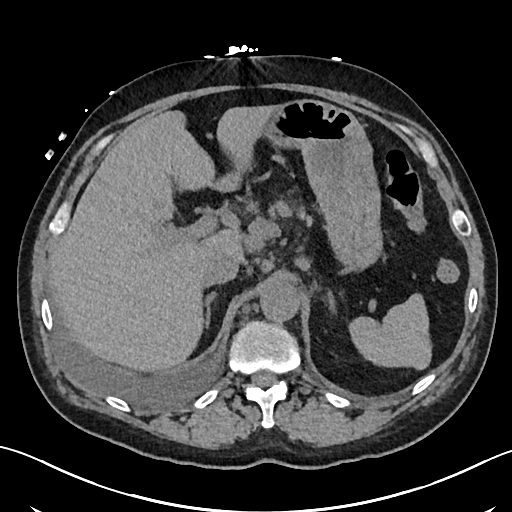
[im 14/170  lung]
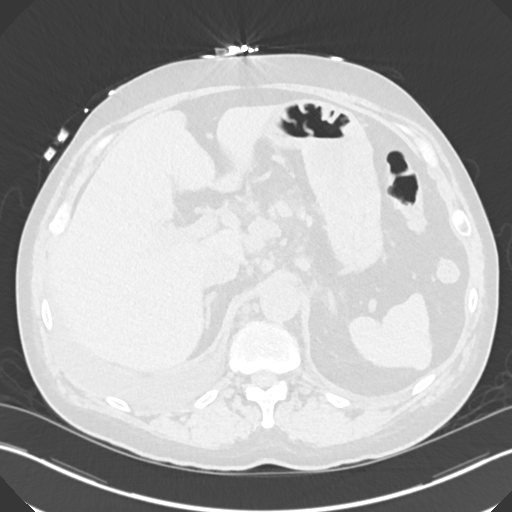
[im 27/170  lung]
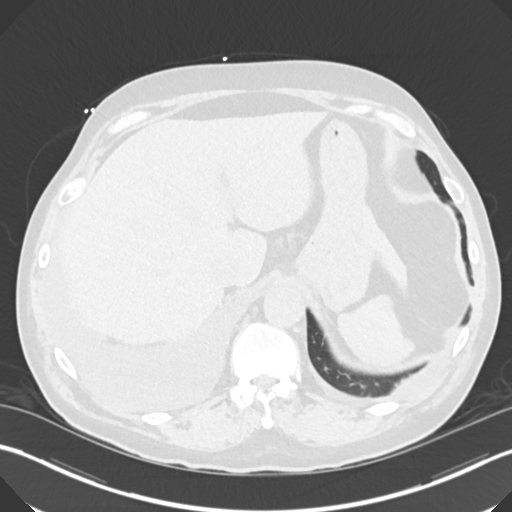
[im 53/170  lung]
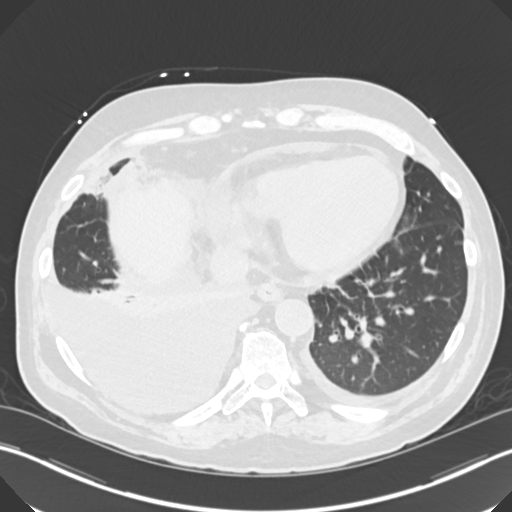
[im 66/170  lung]
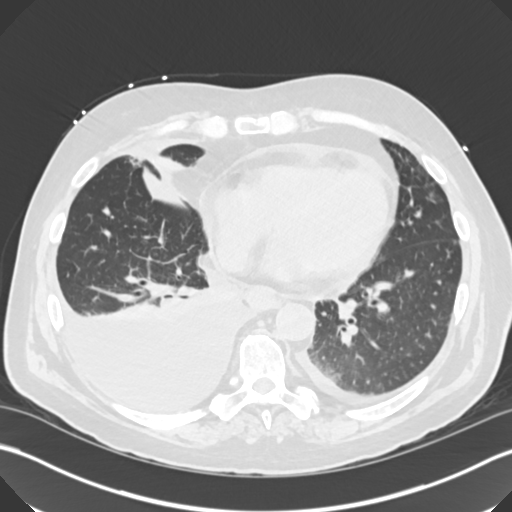
[im 79/170  mediastinal]
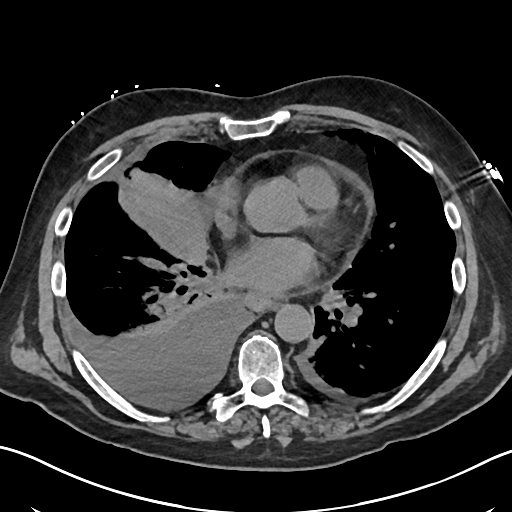
[im 79/170  lung]
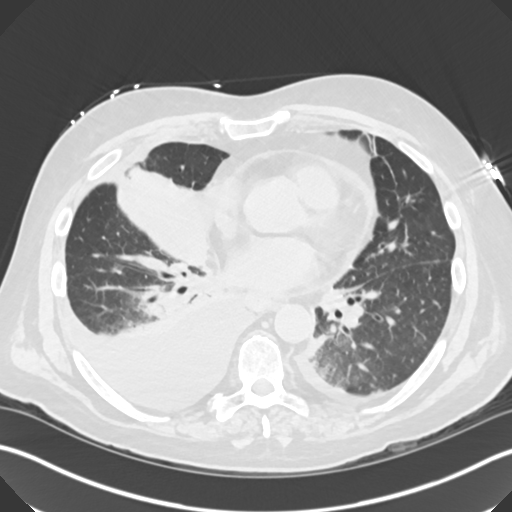
[im 92/170  lung]
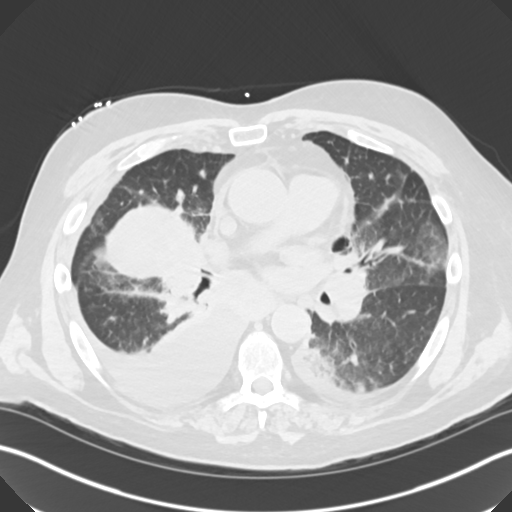
[im 105/170  lung]
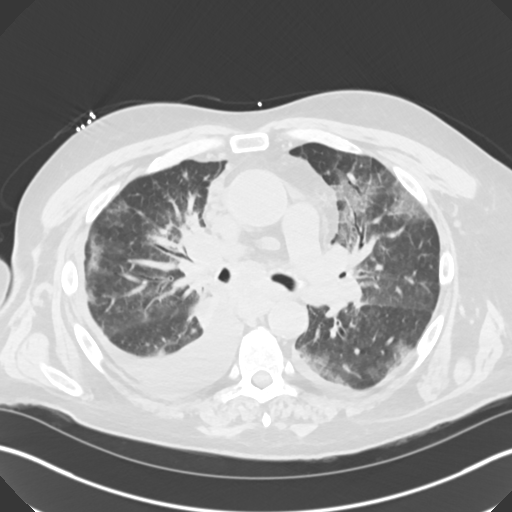
[im 131/170  lung]
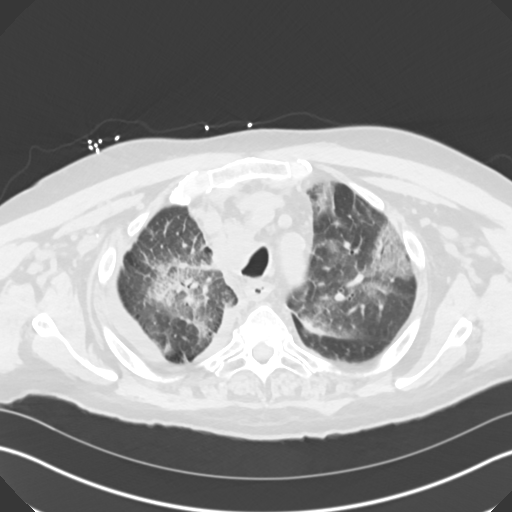
[im 144/170  mediastinal]
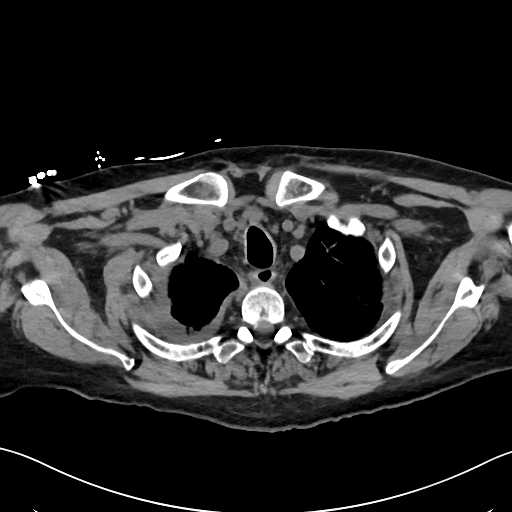
[im 144/170  lung]
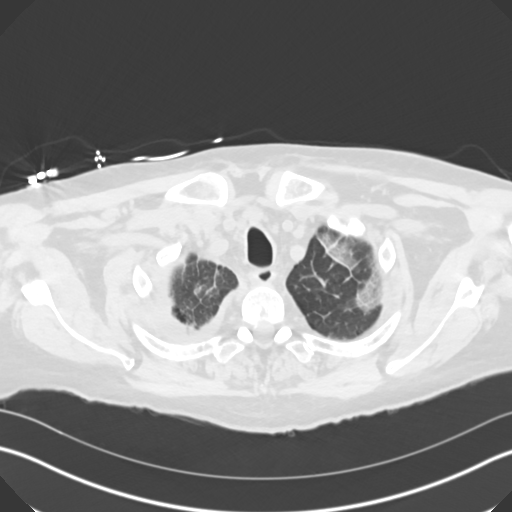
[im 157/170  lung]
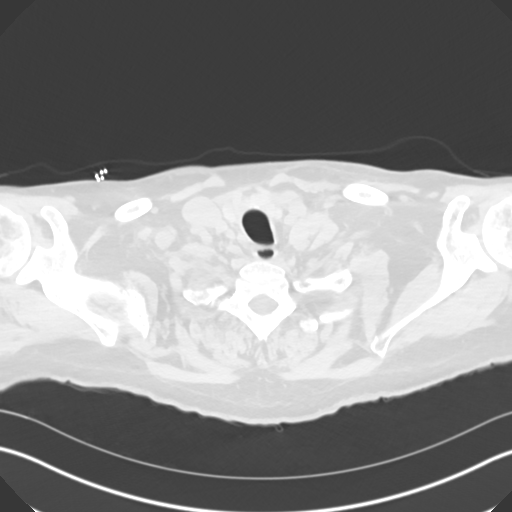

[Series 7: cor · coronal · 0.66mm/px · 3 of 155 slices shown]
[im 31/155  lung]
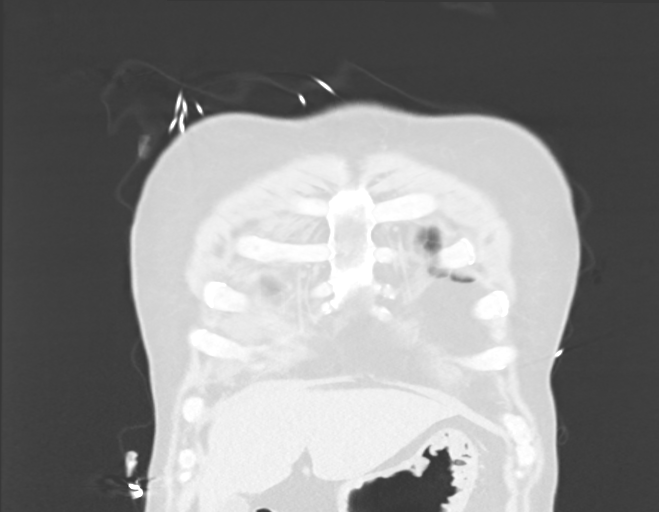
[im 62/155  lung]
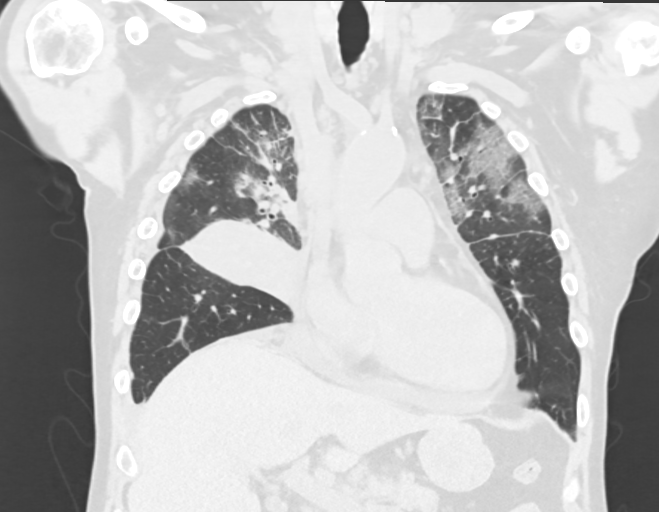
[im 93/155  lung]
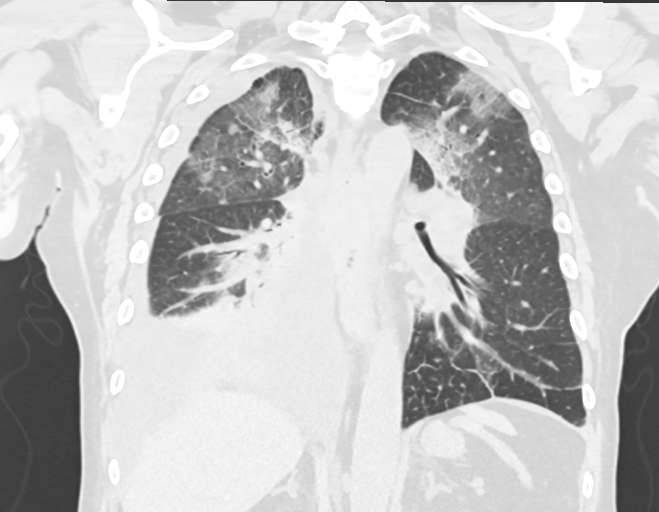

[13 of 36 positions shown; findings below may reference images not displayed]

FINDINGS: Cardiovascular: Mild panchamber cardiomegaly is increased since the
prior study is also circumferential pericardial effusion now 1.5 cm
in thickness, previously no more than 6 mm.

There is interval increased distention of the superior pulmonary
veins and of the pulmonary trunk with the pulmonary trunk 3.5 cm
indicating arterial hypertension.

There is patchy calcification in the left main, LAD and circumflex
coronary arteries, mild calcification in the aorta and branch
vessels, stably ectatic aortic root and ascending segment both
measuring 4.2 cm with the remainder of the aorta normal caliber.

Mediastinum/Nodes: Adenopathy is difficult to evaluate given the
lack of IV contrast but there is notable worsening.

An index left supraclavicular lymph node now measures 2.1 x 2.5 cm
on series 3 axial 7, previously 2.2 x 2.2 cm. Axillary surgical
clips are noted in the right but no axillary adenopathy.

In the mediastinum, extensive adenopathy has worsened in the
interval with lobular adenopathy along both hila also increased.

The hilar adenopathy is difficult to separate from adjacent vessels,
but an index right paratracheal lymph node now measures 3.5 x 3.4 cm
on axial 7, was previously 2.7 x 2.2 cm, with an index AP window
lymph node now 3.1 x 2.4 cm previously 1.7 x 1.2 cm, left-sided
precarinal nodal complex now 3.3 x 1.6 cm on axial 59 and previously
1.5 x 0.9 cm; and a conglomerate, centrally necrotic subcarinal mass
is now 4.7 x 4.1 cm on axial 73 mm previously 4.3 x 3.2 cm.

No mass is seen visualized thyroid gland. The tracheal air column is
patent. No focal esophageal thickening.

Lungs/Pleura: There is a perihilar mass in the base of the right
upper lobe centered medially but extending to the periphery and as
before, is difficult to separate from adjacent atelectasis but has
also enlarged, now measuring estimated 8.5 x 5.4 cm, previously
x 4.0 cm, again completely collapsing the right middle lobe first
order bronchi and likely also the right lower lobe interlobar
pulmonary artery.

There is interval new demonstration of a moderate right and small
left layering pleural effusions with a small amount of the right
pleural fluid loculated anteriorly. There is interlobular septal
thickening in the lung bases and apices also increased. Paraseptal
emphysematous changes in the apices are again noted.

Most of the right lower lobe is collapsed by the right pleural
effusion. Elsewhere there are interstitial and dense ground-glass
opacities in a patchy distribution in the upper lobes with small
amounts in the lower lobes, in the left lower lobe primarily in the
periphery. I believe the right middle lobe is probably collapsed due
to the mass.

Upper Abdomen: There is increased adenopathy in the porta hepatis
and gastrohepatic ligament as well as in the aortocaval space and
portacaval space. Without contrast no mass in the visualized liver
seen , no adrenal mass.

Musculoskeletal: No destructive osseous or lytic lesions are
observed.
IMPRESSION: 1. Worsening of extensive mediastinal and hilar adenopathy with
enlarging left supraclavicular and abdominal retroperitoneal nodes.
2. Enlarging right upper lobe mass which now appears to collapse the
right middle lobe and effaces its proximal bronchi.
3. Cardiomegaly with increased pericardial effusion, increased
venous distension, moderate right and small left pleural effusions,
and interstitial edema in the lung apices and bases.
4. Interstitial and ground-glass opacities in the upper greater than
lower lobes consistent with edema, pneumonia or combination.
Alternatively lymphangitic carcinomatosis.
5. Aortic and coronary artery atherosclerosis with 4.2 cm ectasia of
the aortic root and ascending segment, and increased prominence of
the pulmonary trunk.

## 2021-12-02 MED ORDER — GUAIFENESIN ER 600 MG PO TB12
1200.0000 mg | ORAL_TABLET | Freq: Two times a day (BID) | ORAL | Status: DC
  Administered 2021-12-02 – 2021-12-04 (×5): 1200 mg via ORAL
  Filled 2021-12-02 (×6): qty 2

## 2021-12-02 MED ORDER — IPRATROPIUM-ALBUTEROL 0.5-2.5 (3) MG/3ML IN SOLN
3.0000 mL | Freq: Four times a day (QID) | RESPIRATORY_TRACT | Status: DC | PRN
  Administered 2021-12-02 – 2021-12-03 (×2): 3 mL via RESPIRATORY_TRACT
  Filled 2021-12-02 (×2): qty 3

## 2021-12-02 MED ORDER — CEFAZOLIN SODIUM-DEXTROSE 2-4 GM/100ML-% IV SOLN
2.0000 g | Freq: Three times a day (TID) | INTRAVENOUS | Status: DC
Start: 2021-12-02 — End: 2021-12-03
  Administered 2021-12-02 – 2021-12-03 (×3): 2 g via INTRAVENOUS
  Filled 2021-12-02 (×4): qty 100

## 2021-12-02 MED ORDER — CHLORHEXIDINE GLUCONATE CLOTH 2 % EX PADS
6.0000 | MEDICATED_PAD | Freq: Once | CUTANEOUS | Status: AC
  Administered 2021-12-02: 22:00:00 6 via TOPICAL

## 2021-12-02 NOTE — Progress Notes (Signed)
PROGRESS NOTE  Christopher Randall  DOB: November 24, 1961  PCP: Fredirick Lathe, PA-C QXI:[id number]  DOA: 11/27/2021  LOS: 5 days  Hospital Day: 6  Chief complaint: Dyspnea, pedal edema  Brief narrative: Christopher Randall is a 60 y.o. male with lung cancer with mets to brain admitted October 2022, follows up with Dr. Irene Limbo as an outpatient and received 1 session of radiotherapy and chemotherapy on 12/2. Per patient's wife, patient had weakness prior to chemotherapy as well but it significantly got worse after chemotherapy. Patient presented to the ED on 12/9 with complaint of progressively worsening shortness of breath, weeping bilateral lower extremity edema, increased sleepiness, progressive fatigue, worsening confusion, word finding difficulties.  MRI in the ED showed numerous small foci of acute to subacute infarcts involving multiple vascular territories suggestive of a central embolic source. Chest x-ray showed bilateral airspace disease with small right pleural effusion, concerning for multifocal pneumonia. EKG on admission showed inferolateral ST elevation MI. He was initially admitted to ICU transferred out to hospitalist service on 12/13.  Subjective: Patient was seen and examined this morning.  Pleasant elderly Caucasian male.  Sitting up in the edge of the bed.  On 3 L oxygen by nasal cannula.  Wife at bedside. Patient is still has trouble taking a deep breath as it incites cough.  Assessment/Plan: Bilateral stroke -Multifocal acute to subacute infarct noted in MRI after an admission -Stroke work-up completed.  Neurology consult appreciated. -Imaging findings consistent with embolic shower possibly in the setting of hypercoagulable state due to advanced malignancy.   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  Carotid Dopplers were unremarkable.   -Ultrasound duplex scan of lower extremities showed bilateral DVT but echocardiogram did not show any PFO.  It  showed EF of 55%. -Neurology recommended anticoagulation and hence patient was started on IV heparin.  Bilateral DVT of right and left gastrocnemius muscles -Probably related to underlying hypercoagulable state related to malignancy -On IV heparin drip  Inferolateral ST elevation MI Hyperlipidemia -EKG on admission showed ST elevation in anterolateral leads.  Cardiology was consulted.  Echocardiogram did not show wall motion abnormality.  Patient was deemed not a candidate for invasive angiography due to thrombocytopenia as well as advanced malignancy.  Per previous note, cardiology recommended Plavix but it was not initiated because of potential need of orthopedic surgery on 12/15. -Continue metoprolol, statin  Left elbow septic bursitis -Orthopedic surgery consulted.  Fluid aspirated from the left elbow on 12/12 with preliminary cultures growing gram-positive cocci. -ID and orthopedics consulted.  Patient is planned for washout in the OR on 12/15. -Planning to transition to NOAC after OR.  Acute respiratory failure with hypoxia Multifocal pneumonia -Seizures today 12/14 showed interstitial and groundglass opacities in the upper greater than lower lobes consistent with edema/pneumonia combination or lymphangitic carcinomatosis.   -Currently patient is on IV Ancef and IV vancomycin.   -We will discuss with pulmonary and oncology.   -Continue oxygen supplementation.  Wean down as tolerated.  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 Outpatient follow-up with oncology   Thrombocytopenia Leukemoid reaction -likely in the setting of malignancy/infection.   -Received 2 units of platelet transfusion on 12/10. Recent Labs  Lab 11/27/21 1142 11/27/21 1719 11/28/21 0031 11/28/21 1016 11/28/21 1447 11/29/21 0307 11/30/21 0406 12/01/21 0207 12/02/21 0452  WBC 67.0* 59.3*   < > 43.9* 39.7* 42.4* 40.8* 41.6*  40.8*  NEUTROABS 53.2* 54.0*  --  33.7*  --   --   --   --  34.1*  HGB 11.9* 12.2*   < > 10.8* 10.2* 10.3* 10.0* 9.8* 10.2*  HCT 34.0* 34.6*   < > 30.2* 28.7* 29.3* 28.5* 28.8* 29.6*  MCV 88.5 89.4   < > 88.3 88.6 89.3 90.5 90.0 90.5  PLT 29* 28*   < > 87* 79* 67* 66* 84* 89*   < > = values in this interval not displayed.   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   -Looks euvolemic.  Gradually improving sodium level.  Avoid IV fluid.  I would order fluid restriction.  Recent Labs  Lab 11/27/21 1142 11/27/21 1719 11/27/21 1955 11/28/21 0031 11/30/21 0406 12/01/21 0717 12/02/21 0452  NA 120* 119* 120* 121* 122* 124* 126*   Hypokalemia -Improved potassium level replacement. Recent Labs  Lab 11/27/21 1955 11/28/21 0031 11/30/21 0406 12/01/21 0717 12/02/21 0452  K 3.9 3.7 3.7 3.0* 3.5  MG  --   --  1.8  --  1.8  PHOS  --   --  2.8  --   --    Mobility: Encourage ambulation Living condition: Was living at home Goals of care:   Code Status: Full Code  Nutritional status: Body mass index is 30.64 kg/m.      Diet:  Diet Order             Diet regular Room service appropriate? Yes; Fluid consistency: Thin; Fluid restriction: 1500 mL Fluid  Diet effective now                  DVT prophylaxis: Heparin drip    Antimicrobials: Ancef Fluid: None Consultants: Pulmonology Family Communication: Wife at bedside  Status is: Inpatient  Continue in-hospital care because: Remains weak, low sodium level Level of care: Progressive   Dispo: The patient is from: Home              Anticipated d/c is to: CIR              Patient currently is not medically stable to d/c.   Difficult to place patient No     Infusions:   sodium chloride 10 mL/hr at 12/01/21 2334    ceFAZolin (ANCEF) IV 2 g (12/02/21 1333)   heparin 1,650 Units/hr (12/02/21 0331)    Scheduled Meds:  sodium chloride   Intravenous Once   atorvastatin  40 mg Oral Daily    Chlorhexidine Gluconate Cloth  6 each Topical Daily   dexamethasone  1 mg Oral Q breakfast   fluconazole  100 mg Oral Daily   folic acid  1 mg Oral Daily   guaiFENesin  1,200 mg Oral BID   megestrol  160 mg Oral Daily   metoprolol tartrate  12.5 mg Oral BID   nicotine  7 mg Transdermal Daily   pantoprazole  40 mg Oral Daily   silver sulfADIAZINE   Topical BID   vitamin B-12  1,000 mcg Oral Daily    PRN meds: sodium chloride, acetaminophen **OR** acetaminophen, clonazePAM, HYDROcodone-acetaminophen, ipratropium-albuterol, magic mouthwash w/lidocaine, ondansetron **OR** ondansetron (ZOFRAN) IV, polyvinyl alcohol   Antimicrobials: Anti-infectives (From admission, onward)    Start     Dose/Rate Route Frequency Ordered Stop   12/02/21 1400  ceFAZolin (ANCEF) IVPB 2g/100 mL premix        2 g 200 mL/hr over 30 Minutes Intravenous Every 8 hours 12/02/21 1041  12/01/21 0600  vancomycin (VANCOCIN) IVPB 1000 mg/200 mL premix  Status:  Discontinued        1,000 mg 200 mL/hr over 60 Minutes Intravenous Every 12 hours 11/30/21 1025 12/02/21 1041   11/29/21 0300  vancomycin (VANCOREADY) IVPB 1500 mg/300 mL  Status:  Discontinued        1,500 mg 150 mL/hr over 120 Minutes Intravenous Every 24 hours 11/28/21 1008 11/30/21 1025   11/28/21 2200  vancomycin (VANCOREADY) IVPB 1750 mg/350 mL  Status:  Discontinued        1,750 mg 175 mL/hr over 120 Minutes Intravenous Every 24 hours 11/27/21 2101 11/28/21 1008   11/28/21 1000  fluconazole (DIFLUCAN) tablet 100 mg       Note to Pharmacy: Take 2 tablets today, then 1 tablet daily x 20 more days.     100 mg Oral Daily 11/28/21 0814     11/27/21 2115  ceFEPIme (MAXIPIME) 2 g in sodium chloride 0.9 % 100 mL IVPB  Status:  Discontinued        2 g 200 mL/hr over 30 Minutes Intravenous Every 8 hours 11/27/21 2044 11/30/21 0945   11/27/21 2100  vancomycin (VANCOREADY) IVPB 2000 mg/400 mL        2,000 mg 200 mL/hr over 120 Minutes Intravenous  Once  11/27/21 2025 11/28/21 0315   11/27/21 1845  ceFAZolin (ANCEF) IVPB 2g/100 mL premix  Status:  Discontinued        2 g 200 mL/hr over 30 Minutes Intravenous  Once 11/27/21 1831 11/27/21 1951       Objective: Vitals:   12/02/21 1141 12/02/21 1300  BP: (!) 139/101   Pulse: (!) 104   Resp: 14   Temp: 97.8 F (36.6 C)   SpO2: 92% 95%    Intake/Output Summary (Last 24 hours) at 12/02/2021 1751 Last data filed at 12/02/2021 0357 Gross per 24 hour  Intake 944.22 ml  Output 300 ml  Net 644.22 ml   Filed Weights   11/29/21 0500 11/30/21 0500 12/01/21 0535  Weight: 90.9 kg 94.2 kg 94.1 kg   Weight change:  Body mass index is 30.64 kg/m.   Physical Exam: General exam: Pleasant middle-aged Caucasian male.  Looks older for his age Skin: No rashes, lesions or ulcers. HEENT: Atraumatic, normocephalic, no obvious bleeding Lungs: Diminished air entry to both bases, coughs on deep breathing CVS: Regular rate and rhythm, no murmur GI/Abd soft, nontender, nondistended, bowel sound present CNS: Alert, awake, oriented x3 Psychiatry: Sad affect Extremities: No pedal edema, no calf tenderness  Data Review: I have personally reviewed the laboratory data and studies available.  F/u labs ordered Unresulted Labs (From admission, onward)     Start     Ordered   12/03/21 3710  Basic metabolic panel  Daily,   R     Question:  Specimen collection method  Answer:  Lab=Lab collect   12/02/21 1751   12/01/21 0500  Heparin level (unfractionated)  Daily,   R     Question:  Specimen collection method  Answer:  Lab=Lab collect   11/29/21 1943   11/30/21 0500  CBC  Daily,   R     Question:  Specimen collection method  Answer:  Lab=Lab collect   11/29/21 0957   11/27/21 2013  Expectorated Sputum Assessment w Gram Stain, Rflx to Resp Cult  Once,   R        11/27/21 2013   11/27/21 2010  Strep pneumoniae urinary antigen  (COPD /  Pneumonia / Cellulitis / Lower Extremity Wound)  Once,   R         11/27/21 2013   11/27/21 1719  Urinalysis, Routine w reflex microscopic  Once,   STAT        11/27/21 1718            Signed, Terrilee Croak, MD [facility] 12/02/2021

## 2021-12-02 NOTE — Progress Notes (Signed)
Speech Language Pathology Treatment: Cognitive-Linquistic  Patient Details Name: Christopher Randall MRN: [id number] DOB: 11/25/1961 Today's Date: 12/02/2021 Time: 0950-1005 SLP Time Calculation (min) (ACUTE ONLY): 15 min  Assessment / Plan / Recommendation Clinical Impression  Pt seen at bedside for informal re-assessment of speech, language, and cognition. Pt's wife was present during this session. She indicates pt's cog/com functions have improved since initial evaluation 11/28/21. No anomia was noted during conversation today. Speech is fully intelligible. Pt was able to attend to conversation, even with interruption and return to task. At baseline, pt has difficulty with attention and recall. Pt reports 7/10 pain in his right elbow and ankles. He indicates he is more concerned with his physical/medical status at this time rather than cognition. Pt plans to transfer to CIR when bed available. Recommend reassessment with speech therapy to thoroughly assess and address higher levels of cognition. Pt and wife in agreement.    HPI HPI: 60 y.o. male with PMH significant for depression, lung cancer with known brain mets with recent first cycle of chemo + immunotherapy on 12/2 and SRT of brain lesions on 11/18 who was seen by his oncologist with BL leg swelling with skin breakdown and weeping wounds and hallucinations. He had MRI Brain with an without contrast which demonstrated BL embolic appearing strokes. Per notes, he reports poor attention, memory, hallucinations.      SLP Plan  Discharge SLP treatment due to initial goals met     Recommendations for follow up therapy are one component of a multi-disciplinary discharge planning process, led by the attending physician.  Recommendations may be updated based on patient status, additional functional criteria and insurance authorization.    Recommendations   Reassessment with speech therapy on CIR                General recommendations: Rehab  consult Oral Care Recommendations: Oral care BID Follow Up Recommendations:  (rec CIR ST evaluation for high level cognition) Assistance recommended at discharge: Frequent or constant Supervision/Assistance SLP Visit Diagnosis: Cognitive communication deficit (P91.505) Plan: Discharge SLP treatment due to initial goals met         Chantele Corado B. Quentin Ore, [facility] [place], [place] Speech Language Pathologist Office: [phone]  Shonna Chock  12/02/2021, 10:10 AM

## 2021-12-02 NOTE — Progress Notes (Signed)
Inpatient Rehabilitation Admissions Coordinator   Noted plans by ortho for surgery per family and Orion Crook, [place]. I await medical readiness to admit to CIR. I met with patient and his wife at bedside and they are aware.  Danne Baxter, RN, MSN Rehab Admissions Coordinator [phone] 12/02/2021 10:39 AM

## 2021-12-02 NOTE — Progress Notes (Signed)
Physical Therapy Treatment Patient Details Name: Christopher Randall MRN: [id number] DOB: 27-Dec-1960 Today's Date: 12/02/2021   History of Present Illness 60 year old male presents to the ER with increasing shortness of breath and increasing lower extremity edema with skin breakdown and associated erythema. 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 Irene Limbo as an outpatient.  He just started chemotherapy and radiation therapy. He also had worsening confusion and word searching difficulties. He was seen in the oncology clinic and sent to the hospital. He was initially admitted to the ICU, was found to have bilateral embolic strokes, STEMI. PMH: melanoma with brain mets and radiosurgery, anxiety/ depression.    PT Comments    Pt received in supine, agreeable to therapy session and with good participation and tolerance for gait and transfer training using RW. Pt c/o increased B heel and L elbow pain therefore had him utilize RW for safety and comfort, pt steadier using RW for short distances in room needing minA at most for safety. Pt with decreased awareness of lines/leads and needs mod cues for safety and sequencing while turning/moving around room. Pt anxious regarding need for staff assist but cooperative throughout. Pt continues to benefit from PT services to progress toward functional mobility goals. HR to 120's and SpO2 WFL on 3L O2 [name] but pt dyspneic 2/4 needing frequent brief seated breaks to recover. Continue to recommend AIR.  Recommendations for follow up therapy are one component of a multi-disciplinary discharge planning process, led by the attending physician.  Recommendations may be updated based on patient status, additional functional criteria and insurance authorization.  Follow Up Recommendations  Acute inpatient rehab (3hours/day)     Assistance Recommended at Discharge Frequent or constant Supervision/Assistance  Equipment  Recommendations  Rolling walker (2 wheels) (pending progress)    Recommendations for Other Services       Precautions / Restrictions Precautions Precautions: Fall Precaution Comments: L elbow septic bursitis, B heel wounds, monitor SpO2 Restrictions Weight Bearing Restrictions: No     Mobility  Bed Mobility Overal bed mobility: Needs Assistance Bed Mobility: Supine to Sit     Supine to sit: Supervision     General bed mobility comments: bed flat, no use of rails    Transfers Overall transfer level: Needs assistance Equipment used: Rolling walker (2 wheels) Transfers: Sit to/from Stand Sit to Stand: Min guard           General transfer comment: from EOB, toilet and chair heights. Pt attempted to stand without UE support but unable and needing BUE support from chair armrests to perform without external assist    Ambulation/Gait Ambulation/Gait assistance: +2 safety/equipment;Min assist Gait Distance (Feet): 40 Feet (13ft, seated break, 62ft, seated break, 9ft) Assistive device: Rolling walker (2 wheels) Gait Pattern/deviations: Step-through pattern;Decreased stride length;Narrow base of support;Drifts right/left;Trunk flexed Gait velocity: decreased     General Gait Details: Pt with narrow BOS, fair use of RW occasionally needs reminders for posture and proximity to RW. Pt anxious to walk in hallway however dyspneic after ~44ft and requesting seated break, then friend entering room and pt requesting to visit with him.  HR 110-121 bpm and SpO2 95% on wall O2 [place]   Stairs             Wheelchair Mobility    Modified Rankin (Stroke Patients Only) Modified Rankin (Stroke Patients Only) Pre-Morbid Rankin Score: No symptoms Modified Rankin: Moderately severe disability     Balance Overall balance assessment:  Needs assistance Sitting-balance support: Feet unsupported;No upper extremity supported Sitting balance-Leahy Scale: Good     Standing balance  support: No upper extremity supported;During functional activity Standing balance-Leahy Scale: Fair Standing balance comment: Fair to good with RW support; needs external support when unsupported                            Cognition Arousal/Alertness: Awake/alert Behavior During Therapy: WFL for tasks assessed/performed;Impulsive Overall Cognitive Status: Impaired/Different from baseline Area of Impairment: Following commands;Safety/judgement;Awareness;Problem solving                   Current Attention Level: Sustained Memory: Decreased recall of precautions;Decreased short-term memory Following Commands: Follows one step commands consistently Safety/Judgement: Decreased awareness of safety;Decreased awareness of deficits Awareness: Emergent Problem Solving: Difficulty sequencing;Requires verbal cues General Comments: pt slightly less impulsive today, participatory but c/o increased B foot and elbow pain today. Pt frustrated with staff checking in on him after he flushed toilet, but then pt standing up on his own without alerting staff although he was told to ask for assistance due to multiple lines/IV and O2. Decreased awareness of deficits.        Exercises Other Exercises Other Exercises: review of BUE AROM chest press/frequency, pt distracted will need reinforcement    General Comments General comments (skin integrity, edema, etc.): see gait for vitals; DOE 2/4 with exertion      Pertinent Vitals/Pain Pain Assessment: 0-10 Pain Score: 6  Pain Location: L elbow & bilat heels (dressings c/d/i on B feet) Pain Descriptors / Indicators: Sore Pain Intervention(s): Limited activity within patient's tolerance;Monitored during session    Home Living                          Prior Function            PT Goals (current goals can now be found in the care plan section) Acute Rehab PT Goals Patient Stated Goal: get home, get back to activity, less pain  in elbow and B heels PT Goal Formulation: With patient/family Time For Goal Achievement: 12/12/21 Progress towards PT goals: Progressing toward goals    Frequency    Min 4X/week      PT Plan Current plan remains appropriate    Co-evaluation              AM-PAC PT "6 Clicks" Mobility   Outcome Measure  Help needed turning from your back to your side while in a flat bed without using bedrails?: A Little Help needed moving from lying on your back to sitting on the side of a flat bed without using bedrails?: A Little Help needed moving to and from a bed to a chair (including a wheelchair)?: A Little Help needed standing up from a chair using your arms (e.g., wheelchair or bedside chair)?: A Lot (mod cues for safety) Help needed to walk in hospital room?: A Lot Help needed climbing 3-5 steps with a railing? : A Lot 6 Click Score: 15    End of Session Equipment Utilized During Treatment: Gait belt;Oxygen Activity Tolerance: Patient tolerated treatment well Patient left: in chair;with call bell/phone within reach;with family/visitor present (family present and 1 friend present in room, chair alarm off due to x3 visitors present, family not planning to leave but RN made aware.) Nurse Communication: Mobility status;Other (comment) (poor insight into deficits/high fall risk) PT Visit Diagnosis: Unsteadiness on feet (R26.81);Muscle  weakness (generalized) (M62.81);Pain;Difficulty in walking, not elsewhere classified (R26.2) Pain - part of body: Ankle and joints of foot (elbow)     Time: 4696-2952 PT Time Calculation (min) (ACUTE ONLY): 23 min  Charges:  $Gait Training: 8-22 mins $Therapeutic Activity: 8-22 mins                     Alvino Lechuga P., PTA Acute Rehabilitation Services Pager: [phone] Office: [place] 12/02/2021, 1:44 PM

## 2021-12-02 NOTE — Progress Notes (Signed)
ANTICOAGULATION CONSULT NOTE  Pharmacy Consult for Heparin Indication:  VTE treatment  Allergies  Allergen Reactions   Iopamidol Hives and Itching    Isovue break through with prep, Hives and Itching  Patient had PET?CT scan with 125 mls of Isovue-300. Noted to have two raised itchy hives which self-resolved. Patient had PET/CT scan with contrast on 05/27/2014 while on prednisone prep (3 doses of 50 mg-13 hour prep) and noted to have two hives which self -resolved without treatment   Iodinated Diagnostic Agents Rash   Iodine-131 Rash    Patient Measurements: Height: 5\' 9"  (175.3 cm) Weight: 94.1 kg (207 lb 7.3 oz) IBW/kg (Calculated) : 70.7  Heparin Dosing Weight: 90 kg  Vital Signs: Temp: 97.8 F (36.6 C) (12/14 1141) Temp Source: Oral (12/14 1141) BP: 139/101 (12/14 1141) Pulse Rate: 104 (12/14 1141)  Labs: Recent Labs    11/30/21 0406 11/30/21 1254 12/01/21 0207 12/01/21 0717 12/01/21 0926 12/01/21 1943 12/02/21 0452  HGB 10.0*  --  9.8*  --   --   --  10.2*  HCT 28.5*  --  28.8*  --   --   --  29.6*  PLT 66*  --  84*  --   --   --  89*  HEPARINUNFRC <0.10*   < > 0.31  --  0.28* 0.34 0.42  CREATININE 0.68  --   --  0.79  --   --  0.72   < > = values in this interval not displayed.    Estimated Creatinine Clearance: 112.6 mL/min (by C-G formula based on SCr of 0.72 mg/dL).   Assessment: 60 y.o. M presents with dyspnea and lower extremity weeping. Pt with lung cancer and known brain mets. Pt with b/l embolic strokes on MRI - neurology following. LE dopplers also resulted positive for bilateral acute DVT. Pharmacy consulted for heparin dosing after platelet transfusion given 12/10. CCM, neurology, and cardiology aware of bleeding risks. Both heparin gtt and ASA were held 12/10 with plt drop to 28 despite transfusion.   Heparin level therapeutic x2 HL at 0.34 and 0.42. Hgb 10.2 respectively. plts 89. No issues with heparin infusion or s/sx bleeding per discussion  with RN.      Goal of Therapy:  Heparin level 0.3-0.5 units/ml Monitor platelets by anticoagulation protocol: Yes  Plan:  Continue heparin infusion at 1650 units/hr Check heparin level daily while on heparin Continue to monitor H&H and platelets    Thank you for allowing pharmacy to be a part of this patients care.  Ardyth Harps, PharmD Clinical Pharmacist

## 2021-12-02 NOTE — Plan of Care (Signed)
Patient without distress. No complaints voiced. No changes noted. Will continue to monitor.   Problem: Education: Goal: Knowledge of disease or condition will improve Outcome: Progressing Goal: Knowledge of secondary prevention will improve (SELECT ALL) Outcome: Progressing Goal: Knowledge of patient specific risk factors will improve (INDIVIDUALIZE FOR PATIENT) Outcome: Progressing Goal: Individualized Educational Video(s) Outcome: Progressing   Problem: Coping: Goal: Will verbalize positive feelings about self Outcome: Progressing Goal: Will identify appropriate support needs Outcome: Progressing   Problem: Health Behavior/Discharge Planning: Goal: Ability to manage health-related needs will improve Outcome: Progressing   Problem: Self-Care: Goal: Ability to participate in self-care as condition permits will improve Outcome: Progressing Goal: Verbalization of feelings and concerns over difficulty with self-care will improve Outcome: Progressing Goal: Ability to communicate needs accurately will improve Outcome: Progressing   Problem: Nutrition: Goal: Risk of aspiration will decrease Outcome: Progressing Goal: Dietary intake will improve Outcome: Progressing   Problem: Education: Goal: Knowledge of General Education information will improve Description: Including pain rating scale, medication(s)/side effects and non-pharmacologic comfort measures Outcome: Progressing   Problem: Health Behavior/Discharge Planning: Goal: Ability to manage health-related needs will improve Outcome: Progressing   Problem: Clinical Measurements: Goal: Ability to maintain clinical measurements within normal limits will improve Outcome: Progressing Goal: Will remain free from infection Outcome: Progressing Goal: Diagnostic test results will improve Outcome: Progressing Goal: Respiratory complications will improve Outcome: Progressing Goal: Cardiovascular complication will be  avoided Outcome: Progressing   Problem: Activity: Goal: Risk for activity intolerance will decrease Outcome: Progressing   Problem: Nutrition: Goal: Adequate nutrition will be maintained Outcome: Progressing   Problem: Coping: Goal: Level of anxiety will decrease Outcome: Progressing   Problem: Elimination: Goal: Will not experience complications related to bowel motility Outcome: Progressing Goal: Will not experience complications related to urinary retention Outcome: Progressing   Problem: Pain Managment: Goal: General experience of comfort will improve Outcome: Progressing   Problem: Safety: Goal: Ability to remain free from injury will improve Outcome: Progressing   Problem: Skin Integrity: Goal: Risk for impaired skin integrity will decrease Outcome: Progressing

## 2021-12-03 ENCOUNTER — Encounter (HOSPITAL_COMMUNITY)
Admission: EM | Disposition: A | Discharge status: Rehabilitation Facility | Payer: Self-pay | Source: Home / Self Care | Attending: Internal Medicine

## 2021-12-03 DIAGNOSIS — I824Y3 Acute embolism and thrombosis of unspecified deep veins of proximal lower extremity, bilateral: Secondary | ICD-10-CM

## 2021-12-03 DIAGNOSIS — Z7189 Other specified counseling: Secondary | ICD-10-CM

## 2021-12-03 LAB — BASIC METABOLIC PANEL
Anion gap: 12 (ref 5–15)
BUN: 12 mg/dL (ref 6–20)
CO2: 24 mmol/L (ref 22–32)
Calcium: 8 mg/dL — ABNORMAL LOW (ref 8.9–10.3)
Chloride: 90 mmol/L — ABNORMAL LOW (ref 98–111)
Creatinine, Ser: 0.78 mg/dL (ref 0.61–1.24)
GFR, Estimated: >60 mL/min (ref >60)
Glucose, Bld: 93 mg/dL (ref 70–99)
Potassium: 3.7 mmol/L (ref 3.5–5.1)
Sodium: 126 mmol/L — ABNORMAL LOW (ref 135–145)

## 2021-12-03 LAB — CBC
HCT: 29.5 % — ABNORMAL LOW (ref 39.0–52.0)
Hemoglobin: 10.2 g/dL — ABNORMAL LOW (ref 13.0–17.0)
MCH: 31.4 pg (ref 26.0–34.0)
MCHC: 34.6 g/dL (ref 30.0–36.0)
MCV: 90.8 fL (ref 80.0–100.0)
Platelets: 90 10*3/uL — ABNORMAL LOW (ref 150–400)
RBC: 3.25 MIL/uL — ABNORMAL LOW (ref 4.22–5.81)
RDW: 15.6 % — ABNORMAL HIGH (ref 11.5–15.5)
WBC: 40.5 10*3/uL — ABNORMAL HIGH (ref 4.0–10.5)
nRBC: 0.1 % (ref 0.0–0.2)

## 2021-12-03 LAB — SURGICAL PCR SCREEN
MRSA, PCR: NEGATIVE
Staphylococcus aureus: POSITIVE — AB

## 2021-12-03 LAB — HEPARIN LEVEL (UNFRACTIONATED): Heparin Unfractionated: 0.37 IU/mL (ref 0.30–0.70)

## 2021-12-03 SURGERY — IRRIGATION AND DEBRIDEMENT EXTREMITY
Anesthesia: General | Laterality: Left

## 2021-12-03 MED ORDER — APIXABAN 5 MG PO TABS
5.0000 mg | ORAL_TABLET | Freq: Two times a day (BID) | ORAL | Status: DC
  Administered 2021-12-03 – 2021-12-04 (×2): 5 mg via ORAL
  Filled 2021-12-03 (×3): qty 1

## 2021-12-03 MED ORDER — CEPHALEXIN 500 MG PO CAPS: 500.0000 mg | ORAL_CAPSULE | Freq: Four times a day (QID) | ORAL | Status: DC

## 2021-12-03 MED ORDER — FUROSEMIDE 40 MG PO TABS: 40.0000 mg | ORAL_TABLET | Freq: Every day | ORAL | Status: DC

## 2021-12-03 MED ORDER — MUPIROCIN 2 % EX OINT
1.0000 "application " | TOPICAL_OINTMENT | Freq: Two times a day (BID) | CUTANEOUS | Status: DC
  Administered 2021-12-03 – 2021-12-04 (×3): 1 via NASAL
  Filled 2021-12-03: qty 22

## 2021-12-03 MED ORDER — FUROSEMIDE 40 MG PO TABS
40.0000 mg | ORAL_TABLET | Freq: Every day | ORAL | Status: DC
  Administered 2021-12-04: 40 mg via ORAL
  Filled 2021-12-03 (×2): qty 1

## 2021-12-03 MED ORDER — CEPHALEXIN 500 MG PO CAPS
500.0000 mg | ORAL_CAPSULE | Freq: Three times a day (TID) | ORAL | Status: DC
  Administered 2021-12-03: 500 mg via ORAL
  Filled 2021-12-03: qty 1

## 2021-12-03 MED ORDER — FUROSEMIDE 10 MG/ML IJ SOLN
40.0000 mg | Freq: Once | INTRAMUSCULAR | Status: AC
  Administered 2021-12-03: 40 mg via INTRAVENOUS
  Filled 2021-12-03: qty 4

## 2021-12-03 MED ORDER — POLYETHYLENE GLYCOL 3350 17 G PO PACK: 17.0000 g | PACK | Freq: Every day | ORAL | Status: DC | PRN

## 2021-12-03 MED ORDER — CEFADROXIL 500 MG PO CAPS
1000.0000 mg | ORAL_CAPSULE | Freq: Two times a day (BID) | ORAL | Status: DC
  Administered 2021-12-03 – 2021-12-04 (×3): 1000 mg via ORAL
  Filled 2021-12-03 (×5): qty 2

## 2021-12-03 NOTE — Progress Notes (Signed)
ANTICOAGULATION CONSULT NOTE - Follow Up Consult  Pharmacy Consult for  IV Heparin Indication:  VTE treatment  Allergies  Allergen Reactions   Iopamidol Hives and Itching    Isovue break through with prep, Hives and Itching  Patient had PET?CT scan with 125 mls of Isovue-300. Noted to have two raised itchy hives which self-resolved. Patient had PET/CT scan with contrast on 05/27/2014 while on prednisone prep (3 doses of 50 mg-13 hour prep) and noted to have two hives which self -resolved without treatment   Iodinated Diagnostic Agents Rash   Iodine-131 Rash    Patient Measurements: Height: 5\' 9"  (175.3 cm) Weight: 91.1 kg (200 lb 13.4 oz) IBW/kg (Calculated) : 70.7 Heparin Dosing Weight: 90 kg  Vital Signs: Temp: 97.5 F (36.4 C) (12/15 1219) Temp Source: Oral (12/15 1219) BP: 133/96 (12/15 1219) Pulse Rate: 121 (12/15 1219)  Labs: Recent Labs    12/01/21 0207 12/01/21 0717 12/01/21 0926 12/01/21 1943 12/02/21 0452 12/03/21 0331  HGB 9.8*  --   --   --  10.2* 10.2*  HCT 28.8*  --   --   --  29.6* 29.5*  PLT 84*  --   --   --  89* 90*  HEPARINUNFRC 0.31  --    < > 0.34 0.42 0.37  CREATININE  --  0.79  --   --  0.72 0.78   < > = values in this interval not displayed.    Estimated Creatinine Clearance: 111 mL/min (by C-G formula based on SCr of 0.78 mg/dL).   Assessment: 60 yr old man with lung cancer and known brain mets presented with dyspnea and LE weeping. Pt with bilateral embolic strokes on MRI - neurology following. LE dopplers positive for bilateral acute DVT. Pharmacy was consulted for heparin dosing after platelet transfusion given 12/10. CCM, neurology, oncology, and cardiology aware of bleeding risks.    Heparin level remains therapeutic at 0.37 units/ml on heparin infusion at 1650 units/hr. H/H 10.2/29.6, plt 90 (CBC stable). Per RN, no issues with IV or bleeding observed.  Pharmacy is now consulted to transition pt to apixaban 5 mg PO BID (no apixaban  loading, per Dr Irene Limbo, oncologist).   Goal of Therapy:  Heparin level 0.3-0.5 units/ml Monitor platelets by anticoagulation protocol: Yes  Plan:  Continue heparin infusion at 1650 units/hr until 1800 tonight Discontinue heparin infusion at 1800 tonight and start apixaban 5 mg PO BID at that time Monitor daily CBC/platelets Monitor for bleeding  Thank you for allowing pharmacy to be a part of this patients care.  Gillermina Hu, Pharm D, BCPS, [facility] Clinical Pharmacist

## 2021-12-03 NOTE — Plan of Care (Signed)
°  Problem: Education: Goal: Knowledge of disease or condition will improve Outcome: Progressing   Problem: Coping: Goal: Will verbalize positive feelings about self Outcome: Progressing   Problem: Health Behavior/Discharge Planning: Goal: Ability to manage health-related needs will improve Outcome: Progressing   Problem: Self-Care: Goal: Ability to participate in self-care as condition permits will improve Outcome: Progressing

## 2021-12-03 NOTE — Progress Notes (Addendum)
[facility] for Infectious Disease  Date of Admission:  11/27/2021           Reason for visit: Follow up on septic olecranon bursitis  Current antibiotics: Keflex PO 12/15-present  Previous antibiotics: Cefazolin 12/14-12/15 Vanco 12/9-12/13 Cefepime 12/9-12/11    ASSESSMENT:    60 y.o. male admitted with:  Left septic olecranon bursitis: Secondary to MSSA.  Reevaluated by orthopedic surgery this morning and plans for I&D in the ER have been canceled based on clinical response.  His hospitalist has changed him from IV to oral antibiotics and he is planning to be discharged to CIR. Bilateral anterior and posterior punctate/small infarcts with bilateral DVTs: Suspected cardioembolic source with akinetic apex secondary to STEMI per cardiology.  Blood cultures have been negative and thus lower suspicion for infectious endocarditis as a result.  Suspect more likely hypercoagulable state related to malignancy. Bilateral leg swelling, weeping, and erythema: This has improved over the course of his hospitalization.  Less likely to be a cellulitis given bilateral appearance as this would be unusual.  Petechial rash has improved and may have been more secondary to thrombocytopenia. 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 Thrombocytopenia, leukemoid reaction.  RECOMMENDATIONS:    His antibiotics have been changed to oral Keflex by his hospitalist at 500 mg every 8 hours.   Will change to cefadroxil 1gm BID for ease of dosing Recommend 2-3 weeks total given immunosuppression, lack of incision and drainage, and fairly prolonged symptoms prior to admission.  Final duration may need extension based on clinical response.  If lack of resolution with antibiotics, he may still need I&D.   Will sign off. Follow up appointment made with Dr Linus Salmons on 12/16/21 at 330pm.  Please provide antibiotics to make it to that appointment (that will be about 2.5 weeks total therapy with days of vancomycin  included).   Principal Problem:   Septic olecranon bursitis of left elbow Active Problems:   Anxiety, takes Ativan prn, mostly at night   Lung cancer metastatic to brain (HCC)   Embolic stroke (HCC)   Hyponatremia   Thrombocytopenia (HCC)   Leukocytosis   Anemia   Acute respiratory failure (HCC)   Multifocal pneumonia   Sepsis ([place])   Cellulitis   Confusion   STEMI (ST elevation myocardial infarction) (HCC)    MEDICATIONS:    Scheduled Meds:  sodium chloride   Intravenous Once   atorvastatin  40 mg Oral Daily   cephALEXin  500 mg Oral Q8H   Chlorhexidine Gluconate Cloth  6 each Topical Daily   dexamethasone  1 mg Oral Q breakfast   fluconazole  100 mg Oral Daily   folic acid  1 mg Oral Daily   guaiFENesin  1,200 mg Oral BID   megestrol  160 mg Oral Daily   metoprolol tartrate  12.5 mg Oral BID   mupirocin ointment  1 application Nasal BID   nicotine  7 mg Transdermal Daily   pantoprazole  40 mg Oral Daily   silver sulfADIAZINE   Topical BID   vitamin B-12  1,000 mcg Oral Daily   Continuous Infusions:  sodium chloride 10 mL/hr at 12/01/21 2334   heparin 1,650 Units/hr (12/02/21 1821)   PRN Meds:.sodium chloride, acetaminophen **OR** acetaminophen, clonazePAM, HYDROcodone-acetaminophen, ipratropium-albuterol, magic mouthwash w/lidocaine, ondansetron **OR** ondansetron (ZOFRAN) IV, polyvinyl alcohol  SUBJECTIVE:   24 hour events:  No acute events noted overnight Afebrile T-max 98.5 Orthopedic surgery saw today and canceled his I&D WBC stable  at 40.5 Planning for inpatient rehab admission Blood cultures finalized no growth Aspirate cultures with MSSA  No new complaints.  States that his elbow tightness has improved and swelling seems to have improved as well.  Still having some discomfort with range of motion.  His wife states that the "fever" has gone from his elbow.  Review of Systems  All other systems reviewed and are negative.    OBJECTIVE:   Blood  pressure (!) 124/92, pulse 87, temperature 98.2 F (36.8 C), temperature source Oral, resp. rate 18, height 5\' 9"  (1.753 m), weight 91.1 kg, SpO2 99 %. Body mass index is 29.66 kg/m.  Physical Exam Constitutional:      General: He is not in acute distress.    Appearance: Normal appearance.  HENT:     Head: Normocephalic and atraumatic.  Pulmonary:     Effort: Pulmonary effort is normal. No respiratory distress.  Abdominal:     General: There is no distension.     Palpations: Abdomen is soft.  Musculoskeletal:        General: Swelling and tenderness present.     Comments: Left elbow with swelling and mild erythema.  ROM intact.  Overall appears improved.   Skin:    General: Skin is warm and dry.     Comments: Bilateral legs wrapped in gauze.  No streaking erythema noted.   Neurological:     General: No focal deficit present.     Mental Status: He is alert and oriented to person, place, and time.  Psychiatric:        Mood and Affect: Mood normal.        Behavior: Behavior normal.     Lab Results: Lab Results  Component Value Date   WBC 40.5 (H) 12/03/2021   HGB 10.2 (L) 12/03/2021   HCT 29.5 (L) 12/03/2021   MCV 90.8 12/03/2021   PLT 90 (L) 12/03/2021    Lab Results  Component Value Date   NA 126 (L) 12/03/2021   K 3.7 12/03/2021   CO2 24 12/03/2021   GLUCOSE 93 12/03/2021   BUN 12 12/03/2021   CREATININE 0.78 12/03/2021   CALCIUM 8.0 (L) 12/03/2021   GFRNONAA >60 12/03/2021    Lab Results  Component Value Date   ALT 49 (H) 12/02/2021   AST 45 (H) 12/02/2021   ALKPHOS 105 12/02/2021   BILITOT 0.4 12/02/2021       Component Value Date/Time   CRP 9.0 (H) 11/27/2021 1927       Component Value Date/Time   ESRSEDRATE 1 11/27/2021 1927     I have reviewed the micro and lab results in Epic.  Imaging: CT CHEST WO CONTRAST  Result Date: 12/02/2021 CLINICAL DATA:  Suspected enlarging right hilar mass with multifocal pneumonia/edema. Multifocal adenopathy  on prior CT. EXAM: CT CHEST WITHOUT CONTRAST TECHNIQUE: Multidetector CT imaging of the chest was performed following the standard protocol without IV contrast. COMPARISON:  CT with IV contrast 10/07/2021 FINDINGS: Cardiovascular: Mild panchamber cardiomegaly is increased since the prior study is also circumferential pericardial effusion now 1.5 cm in thickness, previously no more than 6 mm. There is interval increased distention of the superior pulmonary veins and of the pulmonary trunk with the pulmonary trunk 3.5 cm indicating arterial hypertension. There is patchy calcification in the left main, LAD and circumflex coronary arteries, mild calcification in the aorta and branch vessels, stably ectatic aortic root and ascending segment both measuring 4.2 cm with the remainder of the aorta normal  caliber. Mediastinum/Nodes: Adenopathy is difficult to evaluate given the lack of IV contrast but there is notable worsening. An index left supraclavicular lymph node now measures 2.1 x 2.5 cm on series 3 axial 7, previously 2.2 x 2.2 cm. Axillary surgical clips are noted in the right but no axillary adenopathy. In the mediastinum, extensive adenopathy has worsened in the interval with lobular adenopathy along both hila also increased. The hilar adenopathy is difficult to separate from adjacent vessels, but an index right paratracheal lymph node now measures 3.5 x 3.4 cm on axial 7, was previously 2.7 x 2.2 cm, with an index AP window lymph node now 3.1 x 2.4 cm previously 1.7 x 1.2 cm, left-sided precarinal nodal complex now 3.3 x 1.6 cm on axial 59 and previously 1.5 x 0.9 cm; and a conglomerate, centrally necrotic subcarinal mass is now 4.7 x 4.1 cm on axial 73 mm previously 4.3 x 3.2 cm. No mass is seen visualized thyroid gland. The tracheal air column is patent. No focal esophageal thickening. Lungs/Pleura: There is a perihilar mass in the base of the right upper lobe centered medially but extending to the periphery and  as before, is difficult to separate from adjacent atelectasis but has also enlarged, now measuring estimated 8.5 x 5.4 cm, previously 5.6 x 4.0 cm, again completely collapsing the right middle lobe first order bronchi and likely also the right lower lobe interlobar pulmonary artery. There is interval new demonstration of a moderate right and small left layering pleural effusions with a small amount of the right pleural fluid loculated anteriorly. There is interlobular septal thickening in the lung bases and apices also increased. Paraseptal emphysematous changes in the apices are again noted. Most of the right lower lobe is collapsed by the right pleural effusion. Elsewhere there are interstitial and dense ground-glass opacities in a patchy distribution in the upper lobes with small amounts in the lower lobes, in the left lower lobe primarily in the periphery. I believe the right middle lobe is probably collapsed due to the mass. Upper Abdomen: There is increased adenopathy in the porta hepatis and gastrohepatic ligament as well as in the aortocaval space and portacaval space. Without contrast no mass in the visualized liver seen , no adrenal mass. Musculoskeletal: No destructive osseous or lytic lesions are observed. IMPRESSION: 1. Worsening of extensive mediastinal and hilar adenopathy with enlarging left supraclavicular and abdominal retroperitoneal nodes. 2. Enlarging right upper lobe mass which now appears to collapse the right middle lobe and effaces its proximal bronchi. 3. Cardiomegaly with increased pericardial effusion, increased venous distension, moderate right and small left pleural effusions, and interstitial edema in the lung apices and bases. 4. Interstitial and ground-glass opacities in the upper greater than lower lobes consistent with edema, pneumonia or combination. Alternatively lymphangitic carcinomatosis. 5. Aortic and coronary artery atherosclerosis with 4.2 cm ectasia of the aortic root and  ascending segment, and increased prominence of the pulmonary trunk. Electronically Signed   By: Telford Nab M.D.   On: 12/02/2021 03:44     Imaging independently reviewed in Epic.    [facility] [phone] pager 12/03/2021, 9:31 AM  I spent greater than 35 minutes with the patient including greater than 50% of time in face to face counsel of the patient and in coordination of their care.

## 2021-12-03 NOTE — Progress Notes (Addendum)
HEMATOLOGY-ONCOLOGY PROGRESS NOTE  SUBJECTIVE: [name] is followed by our office for stage IV poorly differentiated lung adenocarcinoma.  He has no targetable mutations on guardant 360 testing.  He started a systemic chemotherapy on 11/23/2021.  He was unable to proceed with full dose chemotherapy due to thrombocytopenia.  He received pembrolizumab and dose reduced carboplatin for an AUC of 2 and Alimta was held.  Now admitted for CVA, bilateral DVT, STEMI, and left elbow septic bursitis.  He remains on a heparin drip.  He is scheduled to go to inpatient rehab hopefully tomorrow.  Overall, he reports some improvement in his condition.  He still has some shortness of breath.  He is not having any fevers or chills.  He continues to have tachycardia.  No bleeding reported.  Oncology History  Lung cancer metastatic to brain [facility])  10/09/2021 Initial Diagnosis   Lung cancer metastatic to brain ([place])   11/20/2021 -  Chemotherapy   Patient is on Treatment Plan : LUNG CARBOplatin / Pemetrexed / Pembrolizumab q21d Induction x 4 cycles / Maintenance Pemetrexed + Pembrolizumab      REVIEW OF SYSTEMS:   A 10 point review of systems is negative except as noted in the HPI.  PHYSICAL EXAMINATION: ECOG PERFORMANCE STATUS: 2 - Symptomatic, <50% confined to bed  Vitals:   12/03/21 0900 12/03/21 1219  BP:  (!) 133/96  Pulse: 87 (!) 121  Resp:  18  Temp: 98.2 F (36.8 C) (!) 97.5 F (36.4 C)  SpO2:  98%   Filed Weights   11/30/21 0500 12/01/21 0535 12/03/21 0413  Weight: 94.2 kg 94.1 kg 91.1 kg    Intake/Output from previous day: 12/14 0701 - 12/15 0700 In: 568.8 [I.V.:468.8; IV Piggyback:100] Out: -   GENERAL: Alert, no distress, sitting up in recliner SKIN: Erythema to the left elbow, lower extremities wrapped EYES: normal, Conjunctiva are pink and non-injected, sclera clear LUNGS: clear to auscultation and percussion with normal breathing effort HEART: Tachycardic ABDOMEN:abdomen  soft, non-tender and normal bowel sounds NEURO: alert & oriented x 3 with fluent speech, no focal motor/sensory deficits  LABORATORY DATA:  I have reviewed the data as listed CMP Latest Ref Rng & Units 12/03/2021 12/02/2021 12/01/2021  Glucose 70 - 99 mg/dL 93 97 131(H)  BUN 6 - 20 mg/dL _0 Creatinine 0.61 - 1.24 mg/dL 0.78 0.72 0.79  Sodium 135 - 145 mmol/L 126(L) 126(L) 124(L)  Potassium 3.5 - 5.1 mmol/L 3.7 3.5 3.0(L)  Chloride 98 - 111 mmol/L 90(L) 90(L) 87(L)  CO2 22 - 32 mmol/L _1 Calcium 8.9 - 10.3 mg/dL 8.0(L) 7.9(L) 7.8(L)  Total Protein 6.5 - 8.1 g/dL - 4.8(L) -  Total Bilirubin 0.3 - 1.2 mg/dL - 0.4 -  Alkaline Phos 38 - 126 U/L - 105 -  AST 15 - 41 U/L - 45(H) -  ALT 0 - 44 U/L - 49(H) -    Lab Results  Component Value Date   WBC 40.5 (H) 12/03/2021   HGB 10.2 (L) 12/03/2021   HCT 29.5 (L) 12/03/2021   MCV 90.8 12/03/2021   PLT 90 (L) 12/03/2021   NEUTROABS 34.1 (H) 12/02/2021    DG Chest 2 View  Result Date: 11/27/2021 CLINICAL DATA:  Suspected sepsis EXAM: CHEST - 2 VIEW COMPARISON:  10/27/2021 FINDINGS: Heart is normal size. Bilateral airspace disease, most pronounced in the right lower lobe and left upper lobe. Small right pleural effusion. No acute bony abnormality. IMPRESSION: Bilateral airspace disease with  small right effusion. Findings are concerning for multifocal pneumonia. Electronically Signed   By: Rolm Baptise M.D.   On: 11/27/2021 18:14   CT CHEST WO CONTRAST  Result Date: 12/02/2021 CLINICAL DATA:  Suspected enlarging right hilar mass with multifocal pneumonia/edema. Multifocal adenopathy on prior CT. EXAM: CT CHEST WITHOUT CONTRAST TECHNIQUE: Multidetector CT imaging of the chest was performed following the standard protocol without IV contrast. COMPARISON:  CT with IV contrast 10/07/2021 FINDINGS: Cardiovascular: Mild panchamber cardiomegaly is increased since the prior study is also circumferential pericardial effusion now 1.5 cm in  thickness, previously no more than 6 mm. There is interval increased distention of the superior pulmonary veins and of the pulmonary trunk with the pulmonary trunk 3.5 cm indicating arterial hypertension. There is patchy calcification in the left main, LAD and circumflex coronary arteries, mild calcification in the aorta and branch vessels, stably ectatic aortic root and ascending segment both measuring 4.2 cm with the remainder of the aorta normal caliber. Mediastinum/Nodes: Adenopathy is difficult to evaluate given the lack of IV contrast but there is notable worsening. An index left supraclavicular lymph node now measures 2.1 x 2.5 cm on series 3 axial 7, previously 2.2 x 2.2 cm. Axillary surgical clips are noted in the right but no axillary adenopathy. In the mediastinum, extensive adenopathy has worsened in the interval with lobular adenopathy along both hila also increased. The hilar adenopathy is difficult to separate from adjacent vessels, but an index right paratracheal lymph node now measures 3.5 x 3.4 cm on axial 7, was previously 2.7 x 2.2 cm, with an index AP window lymph node now 3.1 x 2.4 cm previously 1.7 x 1.2 cm, left-sided precarinal nodal complex now 3.3 x 1.6 cm on axial 59 and previously 1.5 x 0.9 cm; and a conglomerate, centrally necrotic subcarinal mass is now 4.7 x 4.1 cm on axial 73 mm previously 4.3 x 3.2 cm. No mass is seen visualized thyroid gland. The tracheal air column is patent. No focal esophageal thickening. Lungs/Pleura: There is a perihilar mass in the base of the right upper lobe centered medially but extending to the periphery and as before, is difficult to separate from adjacent atelectasis but has also enlarged, now measuring estimated 8.5 x 5.4 cm, previously 5.6 x 4.0 cm, again completely collapsing the right middle lobe first order bronchi and likely also the right lower lobe interlobar pulmonary artery. There is interval new demonstration of a moderate right and small  left layering pleural effusions with a small amount of the right pleural fluid loculated anteriorly. There is interlobular septal thickening in the lung bases and apices also increased. Paraseptal emphysematous changes in the apices are again noted. Most of the right lower lobe is collapsed by the right pleural effusion. Elsewhere there are interstitial and dense ground-glass opacities in a patchy distribution in the upper lobes with small amounts in the lower lobes, in the left lower lobe primarily in the periphery. I believe the right middle lobe is probably collapsed due to the mass. Upper Abdomen: There is increased adenopathy in the porta hepatis and gastrohepatic ligament as well as in the aortocaval space and portacaval space. Without contrast no mass in the visualized liver seen , no adrenal mass. Musculoskeletal: No destructive osseous or lytic lesions are observed. IMPRESSION: 1. Worsening of extensive mediastinal and hilar adenopathy with enlarging left supraclavicular and abdominal retroperitoneal nodes. 2. Enlarging right upper lobe mass which now appears to collapse the right middle lobe and effaces its proximal bronchi.  3. Cardiomegaly with increased pericardial effusion, increased venous distension, moderate right and small left pleural effusions, and interstitial edema in the lung apices and bases. 4. Interstitial and ground-glass opacities in the upper greater than lower lobes consistent with edema, pneumonia or combination. Alternatively lymphangitic carcinomatosis. 5. Aortic and coronary artery atherosclerosis with 4.2 cm ectasia of the aortic root and ascending segment, and increased prominence of the pulmonary trunk. Electronically Signed   By: Telford Nab M.D.   On: 12/02/2021 03:44   MR ANGIO HEAD WO CONTRAST  Result Date: 11/28/2021 CLINICAL DATA:  Metastatic disease to brain.  Acute stroke. EXAM: MRA HEAD WITHOUT CONTRAST TECHNIQUE: Angiographic images of the Circle of Willis were  acquired using MRA technique without intravenous contrast. COMPARISON:  MRI head 11/27/2021 FINDINGS: Anterior circulation: Internal carotid artery patent bilaterally. Moderate stenosis right A2 segment. Left anterior cerebral artery widely patent. Middle cerebral artery widely patent bilaterally. Posterior circulation: Left vertebral artery is widely patent supplies the basilar. Left PICA patent. Distal right vertebral artery not visualized and may be occluded. This appears to be a small vessel on the MRI. Basilar widely patent. Posterior cerebral arteries patent without large vessel occlusion. Fetal origin right posterior cerebral artery. Anatomic variants: None Other: Negative for cerebral aneurysm. IMPRESSION: Moderate stenosis right anterior cerebral artery. Left anterior cerebral artery widely patent. Both middle cerebral arteries patent Loss of flow related signal distal right vertebral artery. This may be occluded. Electronically Signed   By: Franchot Gallo M.D.   On: 11/28/2021 13:35   MR BRAIN W WO CONTRAST  Result Date: 11/27/2021 CLINICAL DATA:  Brain metastasis. Brain/central nervous system neoplasm; assess for treatment response. EXAM: MRI HEAD WITHOUT AND WITH CONTRAST TECHNIQUE: Multiplanar, multiecho pulse sequences of the brain and surrounding structures were obtained without and with intravenous contrast. CONTRAST:  72m GADAVIST GADOBUTROL 1 MMOL/ML IV SOLN COMPARISON:  MR head 10/15/2021. FINDINGS: Motion artifact is present. Brain: The largest, likely extra-axial lesion along the right falx has decreased in size measuring approximately 1.2 x 0.6 cm (previously 2.1 x 1.2 cm). Left parasagittal frontal lesion measures approximately 5 mm (series 17, image 102) is slightly smaller (previously 6 mm). The small left parietal lesion is not definitely seen. Extensive edema in the right frontal lobe has nearly resolved. Edema in the anterior left frontal lobe has resolved. No definite new mass or  abnormal enhancement. There are numerous foci of diffusion hyperintensity the cerebral hemispheres involving all but the temporal lobes as well as the cerebellar hemispheres and thalami. No hydrocephalus or extra-axial collection. Vascular: Major vessel flow voids at the skull base are preserved. Skull and upper cervical spine: Normal marrow signal is preserved. Sinuses/Orbits: Paranasal sinuses are aerated. Orbits are unremarkable. Other: Sella is unremarkable.  Mastoid air cells are clear. IMPRESSION: Motion degraded study. Positive treatment response as detailed above. No definite new metastasis. Numerous small foci of acute to subacute infarcts involving multiple vascular territories suggestive of a central embolic source. These results will be called to the ordering clinician or representative by the Radiologist Assistant, and communication documented in the PACS or [employer] Electronically Signed   By: PMacy MisM.D.   On: 11/27/2021 13:05   CT ELBOW LEFT W CONTRAST  Result Date: 11/28/2021 CLINICAL DATA:  Soft tissue infection suspected, upper arm, xray done EXAM: CT OF THE UPPER LEFT EXTREMITY WITH CONTRAST TECHNIQUE: Multidetector CT imaging of the upper left extremity was performed according to the standard protocol following intravenous contrast administration. CONTRAST:  1553m  OMNIPAQUE IOHEXOL 300 MG/ML  SOLN COMPARISON:  None. FINDINGS: Technical note: Examination is limited by beam hardening artifact related to patient's positioning of the elbow adjacent to the abdominal wall. Elbow is in flexion resulting in nonstandard imaging planes. There is also slight motion degradation affecting all series. Bones/Joint/Cartilage No acute fracture. No dislocation. No significant elbow joint effusion is seen. No bony destruction or periosteal elevation. Ligaments Suboptimally assessed by CT. Muscles and Tendons Musculotendinous structures are poorly evaluated. No obvious intramuscular fluid  collection. Soft tissues Fluid collection overlying the olecranon measuring approximately 3.3 x 1.2 x 3.0 cm with peripheral enhancement (series 8, image 119; series 11, image 71) may represent abscess or bursitis. There is prominent soft tissue edema and ill-defined fluid at the elbow and proximal to mid forearm. No soft tissue gas. IMPRESSION: 1. Limited exam. 2. Fluid collection overlying the olecranon measuring approximately 3.3 x 1.2 x 3.0 cm with peripheral enhancement may represent abscess or bursitis. 3. Prominent soft tissue edema and ill-defined fluid at the elbow and proximal to mid forearm suggestive of cellulitis. No soft tissue gas. 4. No acute osseous abnormality. 5. No appreciable elbow joint effusion to suggest septic arthritis. Electronically Signed   By: Davina Poke D.O.   On: 11/28/2021 19:46   DG Chest Port 1 View  Result Date: 11/30/2021 CLINICAL DATA:  Shortness of breath. EXAM: PORTABLE CHEST 1 VIEW COMPARISON:  November 27, 2021. FINDINGS: Stable cardiomediastinal silhouette. Increased central pulmonary vascular congestion is noted as well as bilateral lung opacities concerning for edema. Increased right middle lobe opacity is noted concerning for pneumonia, atelectasis or possibly fluid within the right minor fissure. Bony thorax is unremarkable. IMPRESSION: Increased central pulmonary vascular congestion is noted with increased bilateral lung opacities concerning for edema. Increased right middle lobe opacity is noted concerning for pneumonia, atelectasis or possibly fluid within the right minor fissure. Electronically Signed   By: Marijo Conception M.D.   On: 11/30/2021 08:51   ECHOCARDIOGRAM COMPLETE BUBBLE STUDY  Result Date: 11/28/2021    ECHOCARDIOGRAM REPORT   Patient Name:   Christopher Randall Date of Exam: 11/28/2021 Medical Rec #:  [id number]         Height:       69.0 in Accession #:    [id number]        Weight:       203.7 lb Date of Birth:  06-Aug-1961         BSA:           2.082 m Patient Age:    59 years          BP:           137/84 mmHg Patient Gender: M                 HR:           114 bpm. Exam Location:  Inpatient Procedure: 2D Echo and Saline Contrast Bubble Study Indications:     stroke  History:         Patient has no prior history of Echocardiogram examinations.                  Lung cancer. sepsis.  Sonographer:     Johny Chess RDCS Referring Phys:  [id number] [place] Diagnosing Phys: Jenkins Rouge MD  Sonographer Comments: Image acquisition challenging due to respiratory motion. IMPRESSIONS  1. Patient to return for more images to assess RWMAls and apex with definity . Left  ventricular ejection fraction, by estimation, is 55%. The left ventricle has normal function. The left ventricle has no regional wall motion abnormalities. Left ventricular diastolic parameters were normal.  2. Right ventricular systolic function is normal. The right ventricular size is normal.  3. No evidence of tamponade . Small to moderate. The pericardial effusion is posterior to the left ventricle and anterior to the right ventricle. There is no evidence of cardiac tamponade.  4. The mitral valve is grossly normal. No evidence of mitral valve regurgitation. No evidence of mitral stenosis.  5. The aortic valve is tricuspid. Aortic valve regurgitation is not visualized. No aortic stenosis is present.  6. The inferior vena cava is normal in size with greater than 50% respiratory variability, suggesting right atrial pressure of 3 mmHg.  7. Agitated saline contrast bubble study was negative, with no evidence of any interatrial shunt. FINDINGS  Left Ventricle: Patient to return for more images to assess RWMAls and apex with definity. Left ventricular ejection fraction, by estimation, is 55%. The left ventricle has normal function. The left ventricle has no regional wall motion abnormalities. The left ventricular internal cavity size was normal in size. There is no left ventricular  hypertrophy. Left ventricular diastolic parameters were normal. Right Ventricle: The right ventricular size is normal. No increase in right ventricular wall thickness. Right ventricular systolic function is normal. Left Atrium: Left atrial size was normal in size. Right Atrium: Right atrial size was normal in size. Pericardium: No evidence of tamponade. Small to moderate. The pericardial effusion is posterior to the left ventricle and anterior to the right ventricle. There is no evidence of cardiac tamponade. Presence of epicardial fat layer. Mitral Valve: The mitral valve is grossly normal. No evidence of mitral valve regurgitation. No evidence of mitral valve stenosis. Tricuspid Valve: The tricuspid valve is grossly normal. Tricuspid valve regurgitation is mild . No evidence of tricuspid stenosis. Aortic Valve: The aortic valve is tricuspid. Aortic valve regurgitation is not visualized. No aortic stenosis is present. Pulmonic Valve: The pulmonic valve was grossly normal. Pulmonic valve regurgitation is not visualized. No evidence of pulmonic stenosis. Aorta: The aortic root and ascending aorta are structurally normal, with no evidence of dilitation. Venous: The inferior vena cava is normal in size with greater than 50% respiratory variability, suggesting right atrial pressure of 3 mmHg. IAS/Shunts: The atrial septum is grossly normal. Agitated saline contrast was given intravenously to evaluate for intracardiac shunting. Agitated saline contrast bubble study was negative, with no evidence of any interatrial shunt.  LEFT VENTRICLE PLAX 2D LVIDd:         4.10 cm LVIDs:         2.60 cm LV PW:         1.10 cm LV IVS:        1.00 cm LVOT diam:     2.10 cm LVOT Area:     3.46 cm  RIGHT VENTRICLE         IVC TAPSE (M-mode): 2.7 cm  IVC diam: 2.00 cm LEFT ATRIUM             Index        RIGHT ATRIUM           Index LA diam:        3.40 cm 1.63 cm/m   RA Area:     16.90 cm LA Vol (A2C):   26.3 ml 12.63 ml/m  RA Volume:    39.50 ml  18.97 ml/m LA Vol (A4C):  32.4 ml 15.58 ml/m LA Biplane Vol: 29.2 ml 14.02 ml/m   AORTA Ao Root diam: 3.70 cm Ao Asc diam:  3.60 cm  SHUNTS Systemic Diam: 2.10 cm Jenkins Rouge MD Electronically signed by Jenkins Rouge MD Signature Date/Time: 11/28/2021/1:26:38 PM    Final (Updated)    VAS US CAROTID  Result Date: 11/30/2021 Carotid Arterial Duplex Study Patient Name:  Christopher Randall  Date of Exam:   11/28/2021 Medical Rec #: [id number]          Accession #:    [id number] Date of Birth: [birth year]          Patient Gender: M Patient Age:   45 years Exam Location:  [facility] Procedure:      VAS US CAROTID Referring Phys: Alferd Patee [facility] [place] --------------------------------------------------------------------------------  Indications:       CVA. Risk Factors:      Past history of smoking, prior MI. Comparison Study:  No prior study Performing Technologist: Maudry Mayhew MHA, RDMS, RVT, RDCS  Examination Guidelines: A complete evaluation includes B-mode imaging, spectral Doppler, color Doppler, and power Doppler as needed of all accessible portions of each vessel. Bilateral testing is considered an integral part of a complete examination. Limited examinations for reoccurring indications may be performed as noted.  Right Carotid Findings: +----------+--------+-------+--------+----------------------+------------------+             PSV cm/s EDV     Stenosis Plaque Description     Comments                                 cm/s                                                        +----------+--------+-------+--------+----------------------+------------------+  CCA Prox   103      24                                      intimal thickening  +----------+--------+-------+--------+----------------------+------------------+  CCA Distal 57       13                                      intimal thickening  +----------+--------+-------+--------+----------------------+------------------+  ICA Prox   37        17               smooth and                                                                       heterogenous                               +----------+--------+-------+--------+----------------------+------------------+  ICA Distal 66       30                                                          +----------+--------+-------+--------+----------------------+------------------+  ECA        125      12               smooth and                                                                       heterogenous                               +----------+--------+-------+--------+----------------------+------------------+ +----------+--------+-------+----------------+-------------------+             PSV cm/s EDV cms Describe         Arm Pressure (mmHG)  +----------+--------+-------+----------------+-------------------+  Subclavian 95               Multiphasic, WNL                      +----------+--------+-------+----------------+-------------------+ +---------+--------+--+--------+-+---------+  Vertebral PSV cm/s 20 EDV cm/s 6 Antegrade  +---------+--------+--+--------+-+---------+  Left Carotid Findings: +----------+--------+-------+--------+----------------------+------------------+             PSV cm/s EDV     Stenosis Plaque Description     Comments                                 cm/s                                                        +----------+--------+-------+--------+----------------------+------------------+  CCA Prox   117      23                                      intimal thickening  +----------+--------+-------+--------+----------------------+------------------+  CCA Distal 58       17                                                          +----------+--------+-------+--------+----------------------+------------------+  ICA Prox   34       11               smooth and                                                                       heterogenous                                +----------+--------+-------+--------+----------------------+------------------+  ICA Distal 75  34                                                          +----------+--------+-------+--------+----------------------+------------------+  ECA        73       10                                                          +----------+--------+-------+--------+----------------------+------------------+ +----------+--------+--------+----------------+-------------------+             PSV cm/s EDV cm/s Describe         Arm Pressure (mmHG)  +----------+--------+--------+----------------+-------------------+  Subclavian 143               Multiphasic, WNL                      +----------+--------+--------+----------------+-------------------+ +---------+--------+--+--------+--+---------+  Vertebral PSV cm/s 63 EDV cm/s 32 Antegrade  +---------+--------+--+--------+--+---------+   Summary: Right Carotid: Velocities in the right ICA are consistent with a 1-39% stenosis. Left Carotid: Velocities in the left ICA are consistent with a 1-39% stenosis.               Heterogenous area of the left subclavian area noted; etiology               unknown. Vertebrals:  Bilateral vertebral arteries demonstrate antegrade flow. Subclavians: Normal flow hemodynamics were seen in bilateral subclavian              arteries. *See table(s) above for measurements and observations.  Electronically signed by Antony Contras MD on 11/30/2021 at 8:21:24 AM.    Final    VAS [place] LOWER EXTREMITY VENOUS (DVT) (ONLY [facility] & [facility])  Result Date: 11/29/2021  Lower Venous DVT Study Patient Name:  Christopher Randall  Date of Exam:   11/28/2021 Medical Rec #: [id number]          Accession #:    [id number] Date of Birth: [birth year]          Patient Gender: M Patient Age:   60 years Exam Location:  [facility] Procedure:      VAS [place] LOWER EXTREMITY VENOUS (DVT) Referring Phys: JOSHUA LONG  --------------------------------------------------------------------------------  Indications: Edema, and ulceration.  Risk Factors: Cancer Metastatic non-small cell lung cancer. Comparison Study: No prior study Performing Technologist: Maudry Mayhew MHA, RDMS, RVT, RDCS  Examination Guidelines: A complete evaluation includes B-mode imaging, spectral Doppler, color Doppler, and power Doppler as needed of all accessible portions of each vessel. Bilateral testing is considered an integral part of a complete examination. Limited examinations for reoccurring indications may be performed as noted. The reflux portion of the exam is performed with the patient in reverse Trendelenburg.  +---------+---------------+---------+-----------+----------+--------------+  RIGHT     Compressibility Phasicity Spontaneity Properties Thrombus Aging  +---------+---------------+---------+-----------+----------+--------------+  CFV       Full            Yes       Yes                                    +---------+---------------+---------+-----------+----------+--------------+  SFJ       Full                                                             +---------+---------------+---------+-----------+----------+--------------+  FV Prox   Full                                                             +---------+---------------+---------+-----------+----------+--------------+  FV Mid    Full                                                             +---------+---------------+---------+-----------+----------+--------------+  FV Distal Full                                                             +---------+---------------+---------+-----------+----------+--------------+  PFV       Full                                                             +---------+---------------+---------+-----------+----------+--------------+  POP       Full            Yes       Yes                                     +---------+---------------+---------+-----------+----------+--------------+  PTV       Full                                                             +---------+---------------+---------+-----------+----------+--------------+  PERO      Full                                                             +---------+---------------+---------+-----------+----------+--------------+  Gastroc   None                      No                     Acute           +---------+---------------+---------+-----------+----------+--------------+   +---------+---------------+---------+-----------+----------+--------------+  LEFT      Compressibility Phasicity Spontaneity Properties Thrombus Aging  +---------+---------------+---------+-----------+----------+--------------+  CFV       Full            Yes       Yes                                    +---------+---------------+---------+-----------+----------+--------------+  SFJ       Full                                                             +---------+---------------+---------+-----------+----------+--------------+  FV Prox   Full                                                             +---------+---------------+---------+-----------+----------+--------------+  FV Mid    Full                                                             +---------+---------------+---------+-----------+----------+--------------+  FV Distal Full                                                             +---------+---------------+---------+-----------+----------+--------------+  PFV       Full                                                             +---------+---------------+---------+-----------+----------+--------------+  POP       Full            Yes       Yes                                    +---------+---------------+---------+-----------+----------+--------------+  PTV       Full                                                              +---------+---------------+---------+-----------+----------+--------------+  PERO      Full                                                             +---------+---------------+---------+-----------+----------+--------------+  Gastroc   None                      No                     Acute           +---------+---------------+---------+-----------+----------+--------------+     Summary: RIGHT: - Findings consistent with acute deep vein thrombosis involving the right gastrocnemius veins. - No cystic structure found in the popliteal fossa.  LEFT: - Findings consistent with acute deep vein thrombosis involving the left gastrocnemius veins. - No cystic structure found in the popliteal fossa.  *See table(s) above for measurements and observations. Electronically signed by Harold Barban MD on 11/29/2021 at 7:47:26 PM.    Final    ECHOCARDIOGRAM LIMITED  Result Date: 11/28/2021    ECHOCARDIOGRAM LIMITED REPORT   Patient Name:   Christopher Randall Date of Exam: 11/28/2021 Medical Rec #:  [id number]         Height:       69.0 in Accession #:    [id number]        Weight:       203.7 lb Date of Birth:  04-21-1961         BSA:          2.082 m Patient Age:    74 years          BP:           100/70 mmHg Patient Gender: M                 HR:           115 bpm. Exam Location:  Inpatient Procedure: Limited Echo Indications:    TIA Stroke Abnormal ECG  History:        Patient has prior history of Echocardiogram examinations.                 Stroke; Signs/Symptoms:Altered Mental Status.  Sonographer:    Johny Chess RDCS Referring Phys: [id number] [place]  1. Limited echo: With definity Overall EF preserved distal septal apical and inferior apical hypokinesis No mural apical thormbus seen. FINDINGS  Additional Comments: Limited echo: With definity Overall EF preserved distal septal apical and inferior apical hypokinesis No mural apical thormbus seen. Jenkins Rouge MD Electronically signed by Jenkins Rouge MD  Signature Date/Time: 11/28/2021/2:21:40 PM    Final     ASSESSMENT AND PLAN: This is a 60 year old male with   1) stage IV poorly differentiated lung adenocarcinoma No targetable mutations on guardant 360 MSI high not detected PIK3CA mutation might have clinical trials available elsewhere to consider later in the course of treatment. -10/06/2021 the MRI of the brain with and without contrast- "Multiple peripherally enhancing intracranial lesions, the largest of which appears to be dural based, along the right aspect of the falx, with 2 additional peripherally enhancing lesions in the left frontal and parietal lobes. These are overall concerning for metastatic disease." -10/07/2021 CT chest/abdomen/pelvis with contrast- "1. Right hilar mass which narrows the right middle lobe pulmonary artery and bronchus with obstruction of the lateral segment bronchus of the right middle lobe. The mass is difficult to accurately measure given the associated postobstructive atelectasis, but measures approximately 5.6 x 4.0 cm. 2. Supraclavicular, mediastinal and right hilar adenopathy. 3. Multiple ground-glass nodule in opacities some of which demonstrate a central solid component. Findings which are nonspecific and may be infectious and inflammatory versus reflecting  metastatic disease involvement. Suggest attention on short-term interval follow-up study in 3 months. 4. No evidence of metastatic disease within the abdomen or pelvis. 5. Mild wall thickening of an incompletely distended urinary bladder. Correlate with urinalysis to exclude cystitis. 6. 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 7. Aortic Atherosclerosis (ICD10-I70.0)." -12/02/2021 CT chest without contrast - "1. Worsening of extensive mediastinal and hilar adenopathy with enlarging left supraclavicular and abdominal retroperitoneal nodes. 2. Enlarging right upper lobe mass which now appears to  collapse the right middle lobe and effaces its proximal bronchi. 3. Cardiomegaly with increased pericardial effusion, increased venous distension, moderate right and small left pleural effusions, and interstitial edema in the lung apices and bases. 4. Interstitial and ground-glass opacities in the upper greater than lower lobes consistent with edema, pneumonia or combination. Alternatively lymphangitic carcinomatosis. 5. Aortic and coronary artery atherosclerosis with 4.2 cm ectasia of the aortic root and ascending segment, and increased prominence of the pulmonary trunk."   2) history of melanoma, stage III -Diagnosed June 2012 -Right axillary excisional biopsy in July 2012 consistent with melanoma -Status post right axillary lymph node dissection, 0/17 nodes negative for malignancy -Received high-dose interferon in the ECOG 1609 clinical trial on 10/18/2011 -No evidence of recurrent or metastatic disease on imaging per notes from [facility] with last scan performed on 10/23/2018   3) COVID-19 and influenza A infection diagnosed 10/27/2021.   4) bilateral lower extremity DVT  5) CVA  6) STEMI  7) left elbow septic bursitis  8) leukocytosis  9) anemia  10) thrombocytopenia   PLAN: -The patient initiated systemic chemotherapy for his stage IV lung cancer on 11/23/2021.  He had a CT of the chest performed yesterday which indicates disease progression.  However, the CT performed on 12/14 was compared with the scan performed almost 2 months prior.  In the interval, he did not have systemic treatment until about 10 days ago. -I discussed with the patient that he likely had disease progression prior to initiating systemic treatment and that it would be too soon to see a response from his first cycle of treatment on 11/23/2021.  We will plan to continue the same treatment regimen.  Dosing of carboplatin and addition of Alimta per Dr. Irene Limbo with future cycles. -He has a new CVA and bilateral DVTs.   Currently receiving heparin. He could transition to ELiquis with 2 hour overlap with IV heparin without loading dose on Eliquis. -CBC shows a WBC of 40.5, hemoglobin 10.2 platelet count 90,000.  Leukocytosis likely reactive/leukemoid reaction.  He is on dexamethasone.  Monitor.  The anemia and thrombocytopenia are likely due to recent chemotherapy.  At this time, these are very mild.  Monitor for now. -Discussed with the patient that now our focus is on rehab and treating his left elbow infection.  We discussed that we will resume systemic treatment once his acute issues have resolved.  Future Appointments  Date Time Provider [place]  12/10/2021  1:15 PM [facility] [place] FLUSH [facility] None  12/10/2021  2:15 PM [facility] INFUSION [facility] None  12/12/2021  2:00 PM [facility] [place] FL[facility] None  12/16/2021  3:30 PM Comer, Okey Regal, MD [facility]  12/22/2021  9:00 AM [place]ward[place], Randa Evens, PA-C [facility]  01/13/2022  9:40 AM O'Neal, Cassie Freer, MD [facility]      LOS: 6 days   Mikey Bussing, DNP, AGPCNP-BC, AOCNP 12/03/21   ADDENDUM  .Patient was Personally and independently  interviewed, examined and relevant elements of the history of present illness were reviewed in details and an assessment and plan was created. All elements of the patient's history of present illness , assessment and plan were discussed in details with Mikey Bussing, DNP, AGPCNP-BC, AOCNP. The above documentation reflects our combined findings assessment and plan.   I met with the patient and his wife at bedside.  Given his current performance status, recent CVA, acute MI, cellulitis of his lower extremities, acute DVT and olecranon septic bursitis we would have to currently hold systemic chemotherapy for the time being while he undergoes acute inpatient rehabilitation for the next 1 to 2 weeks. We consulted radiation oncology Dr. Isidore Moos for palliative radiation to his progressive right  lung adenocarcinoma.  She plans to do 10 fractions for palliative radiation to try to control the disease till he can rehabilitate. We will reconnect with the patient in a couple of weeks in oncology clinic and we discussed appropriateness of continued systemic chemotherapy at the time depending on his medical status.  He is transitioning from IV heparin to oral Eliquis without the loading dose.  Sullivan Lone MD MS

## 2021-12-03 NOTE — Progress Notes (Signed)
Physical Therapy Treatment Patient Details Name: Christopher Randall MRN: [id number] DOB: 12/20/1961 Today's Date: 12/03/2021   History of Present Illness 60 year old male presents to the ER with increasing shortness of breath and increasing lower extremity edema with skin breakdown and associated erythema. 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 Irene Limbo as an outpatient.  He just started chemotherapy and radiation therapy. He also had worsening confusion and word searching difficulties. He was seen in the oncology clinic and sent to the hospital. He was initially admitted to the ICU, was found to have bilateral embolic strokes, STEMI. PMH: melanoma with brain mets and radiosurgery, anxiety/ depression.    PT Comments    Pt received in chair, son and daughter in law present, pt eager to mobilize and to attempt stair trial today. Pt dyspneic with exertion to 3/4 but able to recognize need for seated breaks and VSS on 4L O2  with exertion. Pt needing minA +2 for safety and close chair follow for gait and stair progression due to abrupt need for seated breaks when fatigued. Pt continues to benefit from PT services to progress toward functional mobility goals.   Recommendations for follow up therapy are one component of a multi-disciplinary discharge planning process, led by the attending physician.  Recommendations may be updated based on patient status, additional functional criteria and insurance authorization.  Follow Up Recommendations  Acute inpatient rehab (3hours/day)     Assistance Recommended at Discharge Frequent or constant Supervision/Assistance  Equipment Recommendations  Rolling walker (2 wheels) (pending progress)    Recommendations for Other Services       Precautions / Restrictions Precautions Precautions: Fall Precaution Comments: L elbow septic bursitis, B heel wounds, monitor SpO2 Restrictions Weight Bearing  Restrictions: No Other Position/Activity Restrictions: SBP <180, B DVT     Mobility  Bed Mobility               General bed mobility comments: received in chair    Transfers Overall transfer level: Needs assistance Equipment used: Rolling walker (2 wheels) Transfers: Sit to/from Stand Sit to Stand: Min guard           General transfer comment: from chair x5 reps total    Ambulation/Gait Ambulation/Gait assistance: +2 safety/equipment;Min assist Gait Distance (Feet): 75 Feet (59ft, 71ft x2, 66ft with seated breaks between) Assistive device: Rolling walker (2 wheels) Gait Pattern/deviations: Step-through pattern;Decreased stride length;Narrow base of support;Drifts right/left;Trunk flexed Gait velocity: decreased     General Gait Details: Pt with narrow BOS, fair use of RW, close chair follow for safety. Pt anxious re: gait belt but understanding need for it and for seated breaks today.   Stairs Stairs: Yes Stairs assistance: Min assist;+2 safety/equipment Stair Management: Two rails;Step to pattern;Forwards Number of Stairs: 4 (2+2) General stair comments: cues for safety/sequencing, no buckling but pt more dyspneic after needed seated break next to stairs   Wheelchair Mobility    Modified Rankin (Stroke Patients Only) Modified Rankin (Stroke Patients Only) Pre-Morbid Rankin Score: No symptoms Modified Rankin: Moderately severe disability     Balance Overall balance assessment: Needs assistance Sitting-balance support: Feet unsupported;No upper extremity supported Sitting balance-Leahy Scale: Good     Standing balance support: No upper extremity supported;During functional activity Standing balance-Leahy Scale: Fair Standing balance comment: Fair to good with RW support; needs external support when unsupported  Cognition Arousal/Alertness: Awake/alert Behavior During Therapy: WFL for tasks  assessed/performed;Impulsive Overall Cognitive Status: Impaired/Different from baseline Area of Impairment: Following commands;Safety/judgement;Awareness;Problem solving                   Current Attention Level: Sustained Memory: Decreased recall of precautions;Decreased short-term memory Following Commands: Follows one step commands consistently Safety/Judgement: Decreased awareness of safety;Decreased awareness of deficits Awareness: Emergent Problem Solving: Difficulty sequencing;Requires verbal cues General Comments: Decreased awareness of deficits but more agreeable to sit and rest today when dyspneic. SpO2 WFL on 4L O2 [place] with exertion, but DOE 3/4        Exercises      General Comments General comments (skin integrity, edema, etc.): VSS on 4L O2 [place], DOE 3/4      Pertinent Vitals/Pain Pain Assessment: 0-10 Pain Score: 5  Pain Location: L elbow & bilat heels (dressings c/d/i on B feet) Pain Descriptors / Indicators: Sore;Discomfort Pain Intervention(s): Monitored during session;Repositioned;Patient requesting pain meds-RN notified;Ice applied    Home Living                          Prior Function            PT Goals (current goals can now be found in the care plan section) Acute Rehab PT Goals Patient Stated Goal: get home, get back to activity, less pain in elbow and B heels PT Goal Formulation: With patient/family Time For Goal Achievement: 12/12/21 Progress towards PT goals: Progressing toward goals    Frequency    Min 4X/week      PT Plan Current plan remains appropriate    Co-evaluation              AM-PAC PT "6 Clicks" Mobility   Outcome Measure  Help needed turning from your back to your side while in a flat bed without using bedrails?: A Little Help needed moving from lying on your back to sitting on the side of a flat bed without using bedrails?: A Little Help needed moving to and from a bed to a chair (including a  wheelchair)?: A Little Help needed standing up from a chair using your arms (e.g., wheelchair or bedside chair)?: A Little Help needed to walk in hospital room?: A Lot Help needed climbing 3-5 steps with a railing? : A Lot 6 Click Score: 16    End of Session Equipment Utilized During Treatment: Gait belt;Oxygen Activity Tolerance: Patient tolerated treatment well Patient left: in chair;with call bell/phone within reach;with family/visitor present (family present, chair alarm off due to x3 visitors present, family not planning to leave but RN made aware.) Nurse Communication: Mobility status;Other (comment) (poor insight into deficits/high fall risk) PT Visit Diagnosis: Unsteadiness on feet (R26.81);Muscle weakness (generalized) (M62.81);Pain;Difficulty in walking, not elsewhere classified (R26.2) Pain - part of body: Ankle and joints of foot (elbow)     Time: 6468-0321 PT Time Calculation (min) (ACUTE ONLY): 21 min  Charges:  $Gait Training: 8-22 mins                     Gibran Veselka P., PTA Acute Rehabilitation Services Pager: [phone] Office: [place] 12/03/2021, 6:33 PM

## 2021-12-03 NOTE — Progress Notes (Signed)
ANTICOAGULATION CONSULT NOTE  Pharmacy Consult for Heparin Indication:  VTE treatment  Allergies  Allergen Reactions   Iopamidol Hives and Itching    Isovue break through with prep, Hives and Itching  Patient had PET?CT scan with 125 mls of Isovue-300. Noted to have two raised itchy hives which self-resolved. Patient had PET/CT scan with contrast on 05/27/2014 while on prednisone prep (3 doses of 50 mg-13 hour prep) and noted to have two hives which self -resolved without treatment   Iodinated Diagnostic Agents Rash   Iodine-131 Rash    Patient Measurements: Height: 5\' 9"  (175.3 cm) Weight: 91.1 kg (200 lb 13.4 oz) IBW/kg (Calculated) : 70.7  Heparin Dosing Weight: 90 kg  Vital Signs: Temp: 98.1 F (36.7 C) (12/15 0342) Temp Source: Oral (12/14 1946) BP: 164/84 (12/15 0342) Pulse Rate: 105 (12/15 0342)  Labs: Recent Labs    12/01/21 0207 12/01/21 0717 12/01/21 0926 12/01/21 1943 12/02/21 0452 12/03/21 0331  HGB 9.8*  --   --   --  10.2* 10.2*  HCT 28.8*  --   --   --  29.6* 29.5*  PLT 84*  --   --   --  89* 90*  HEPARINUNFRC 0.31  --    < > 0.34 0.42 0.37  CREATININE  --  0.79  --   --  0.72 0.78   < > = values in this interval not displayed.    Estimated Creatinine Clearance: 111 mL/min (by C-G formula based on SCr of 0.78 mg/dL).   Assessment: 60 y.o. M presents with dyspnea and lower extremity weeping. Pt with lung cancer and known brain mets. Pt with b/l embolic strokes on MRI - neurology following. LE dopplers also resulted positive for bilateral acute DVT. Pharmacy consulted for heparin dosing after platelet transfusion given 12/10. CCM, neurology, and cardiology aware of bleeding risks. Both heparin gtt and ASA were held 12/10 with plt drop to 28 despite transfusion.   Heparin level remains therapeutic at 0.37. CBC stable, hgb 10.2, plts 90. No issues with heparin infusion or s/sx bleeding per discussion with RN.      Goal of Therapy:  Heparin level  0.3-0.5 units/ml Monitor platelets by anticoagulation protocol: Yes  Plan:  Continue heparin infusion at 1650 units/hr Check heparin level daily while on heparin Continue to monitor H&H and platelets    Thank you for allowing pharmacy to be a part of this patients care.  Ardyth Harps, PharmD Clinical Pharmacist

## 2021-12-03 NOTE — Progress Notes (Signed)
PROGRESS NOTE  Christopher Randall  DOB: 1961/08/26  PCP: Fredirick Lathe, PA-C WYO:[id number]  DOA: 11/27/2021  LOS: 6 days  Hospital Day: 7  Chief complaint: Dyspnea, pedal edema  Brief narrative: Christopher Randall is a 60 y.o. male with lung cancer with mets to brain admitted October 2022, follows up with Dr. Irene Limbo as an outpatient and received 1 session of radiotherapy and chemotherapy on 12/2. Per patient's wife, patient had weakness prior to chemotherapy as well but it significantly got worse after chemotherapy. Patient presented to the ED on 12/9 with complaint of progressively worsening shortness of breath, weeping bilateral lower extremity edema, increased sleepiness, progressive fatigue, worsening confusion, word finding difficulties.  MRI in the ED showed numerous small foci of acute to subacute infarcts involving multiple vascular territories suggestive of a central embolic source. Chest x-ray showed bilateral airspace disease with small right pleural effusion, concerning for multifocal pneumonia. EKG on admission showed inferolateral ST elevation MI. He was initially admitted to ICU transferred out to hospitalist service on 12/13.  Subjective: Patient was seen and examined this morning.   Sitting up in chair.  On 3 L of cannula.  Wife at bedside. He states he has been using incentive celebratory since yesterday and trying to take deep breaths. He has 1+ pedal edema bilaterally.   Assessment/Plan: Bilateral stroke -Multifocal acute to subacute infarct noted in MRI after an admission -Stroke work-up completed.  Neurology consult appreciated. -Imaging findings consistent with embolic shower possibly in the setting of hypercoagulable state due to advanced malignancy.   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  Carotid Dopplers were unremarkable.   -Ultrasound duplex scan of lower extremities showed bilateral DVT but echocardiogram did not show any PFO.   It showed EF of 55%. -Neurology recommended anticoagulation and hence patient was started on IV heparin.  Bilateral DVT of right and left gastrocnemius muscles -Probably related to underlying hypercoagulable state related to malignancy -On IV heparin drip -We will discuss with oncology about the choice of anticoagulation.  Inferolateral ST elevation MI Hyperlipidemia -EKG on admission showed ST elevation in anterolateral leads.  Cardiology was consulted.  Echocardiogram did not show wall motion abnormality.  Patient was deemed not a candidate for invasive angiography due to thrombocytopenia as well as advanced malignancy.  Per previous note, cardiology recommended Plavix but it was not initiated because of potential need of orthopedic surgery on 12/15. -Continue metoprolol, statin  Left elbow septic bursitis -Orthopedic surgery consulted.  Fluid aspirated from the left elbow on 12/12 with preliminary cultures growing gram-positive cocci. -ID and orthopedics consulted.  Patient was planned for washout in the OR today.  However on reevaluation by orthopedics today, he was noted to be improving with antibiotics and hence did not need surgical debridement.  -Per ID recommendation, patient is now on cefadroxil 1 g twice daily.  ID recommends 2 to 3 weeks of antibiotic total given immunosuppression, lack of I&D.  If no improvement with antibiotics, may still need I&D.  Patient will follow up with ID doctor, on 12/28 at 2:30 PM.    Acute respiratory failure with hypoxia Multifocal pneumonia -CT scan of chest showed interstitial and groundglass opacities in the upper greater than lower lobes consistent with edema/pneumonia combination or lymphangitic carcinomatosis.   -Patient was kept on broad-spectrum antibiotics for a week. -Continue oxygen supplementation.  Wean down as tolerated.  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  -CT chest on 12/14 raise possibility and presented carcinomatosis.  We will discuss with oncology.  Thrombocytopenia Leukemoid reaction -likely in the setting of malignancy/infection.   -Received 2 units of platelet transfusion on 12/10. Recent Labs  Lab 11/27/21 1142 11/27/21 1719 11/28/21 0031 11/28/21 1016 11/28/21 1447 11/29/21 0307 11/30/21 0406 12/01/21 0207 12/02/21 0452 12/03/21 0331  WBC 67.0* 59.3*   < > 43.9*   < > 42.4* 40.8* 41.6* 40.8* 40.5*  NEUTROABS 53.2* 54.0*  --  33.7*  --   --   --   --  34.1*  --   HGB 11.9* 12.2*   < > 10.8*   < > 10.3* 10.0* 9.8* 10.2* 10.2*  HCT 34.0* 34.6*   < > 30.2*   < > 29.3* 28.5* 28.8* 29.6* 29.5*  MCV 88.5 89.4   < > 88.3   < > 89.3 90.5 90.0 90.5 90.8  PLT 29* 28*   < > 87*   < > 67* 66* 84* 89* 90*   < > = values in this interval not displayed.   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   -Looks euvolemic.  Gradually improving sodium level.  Also on 1.5 L daily fluid restriction.   -Has bilateral pedal edema.  I would give him a dose of Lasix IV 40 mg today and start him on oral Lasix 40 mg daily from tomorrow.  Continue to monitor renal function and electrolytes while on Lasix. Recent Labs  Lab 11/27/21 1142 11/27/21 1719 11/27/21 1955 11/28/21 0031 11/30/21 0406 12/01/21 0717 12/02/21 0452 12/03/21 0331  NA 120* 119* 120* 121* 122* 124* 126* 126*   Mobility: Encourage ambulation Living condition: Was living at home Goals of care:   Code Status: Full Code  Nutritional status: Body mass index is 29.66 kg/m.      Diet:  Diet Order             Diet Heart Room service appropriate? Yes; Fluid consistency: Thin  Diet effective now                  DVT prophylaxis: Heparin drip    Antimicrobials: Cefadroxil Fluid: None Consultants: Pulmonology Family Communication: Wife at bedside  Status is: Inpatient  Continue in-hospital care because: Needs further  clinical monitoring.  Pending CIR bed availability. Level of care: Progressive   Dispo: The patient is from: Home              Anticipated d/c is to: CIR              Patient currently is not medically stable to d/c.   Difficult to place patient No     Infusions:   sodium chloride 10 mL/hr at 12/01/21 2334   heparin 1,650 Units/hr (12/03/21 1015)    Scheduled Meds:  sodium chloride   Intravenous Once   atorvastatin  40 mg Oral Daily   cefadroxil  1,000 mg Oral BID   Chlorhexidine Gluconate Cloth  6 each Topical Daily   dexamethasone  1 mg Oral Q breakfast   fluconazole  100 mg Oral Daily   folic acid  1 mg Oral Daily   furosemide  40 mg Intravenous Once   [START ON 12/04/2021] furosemide  40 mg Oral Daily   guaiFENesin  1,200 mg Oral BID   megestrol  160 mg Oral Daily   metoprolol tartrate  12.5 mg Oral BID   mupirocin ointment  1 application Nasal BID   nicotine  7 mg Transdermal Daily   pantoprazole  40 mg Oral Daily   silver sulfADIAZINE   Topical BID   vitamin B-12  1,000 mcg Oral Daily    PRN meds: sodium chloride, acetaminophen **OR** acetaminophen, clonazePAM, HYDROcodone-acetaminophen, ipratropium-albuterol, magic mouthwash w/lidocaine, ondansetron **OR** ondansetron (ZOFRAN) IV, polyethylene glycol, polyvinyl alcohol   Antimicrobials: Anti-infectives (From admission, onward)    Start     Dose/Rate Route Frequency Ordered Stop   12/03/21 1800  cephALEXin (KEFLEX) capsule 500 mg  Status:  Discontinued        500 mg Oral Every 6 hours 12/03/21 0954 12/03/21 1030   12/03/21 1400  cefadroxil (DURICEF) capsule 1,000 mg        1,000 mg Oral 2 times daily 12/03/21 1030     12/03/21 0900  cephALEXin (KEFLEX) capsule 500 mg  Status:  Discontinued        500 mg Oral Every 8 hours 12/03/21 0816 12/03/21 0954   12/02/21 1400  ceFAZolin (ANCEF) IVPB 2g/100 mL premix  Status:  Discontinued        2 g 200 mL/hr over 30 Minutes Intravenous Every 8 hours 12/02/21 1041  12/03/21 0816   12/01/21 0600  vancomycin (VANCOCIN) IVPB 1000 mg/200 mL premix  Status:  Discontinued        1,000 mg 200 mL/hr over 60 Minutes Intravenous Every 12 hours 11/30/21 1025 12/02/21 1041   11/29/21 0300  vancomycin (VANCOREADY) IVPB 1500 mg/300 mL  Status:  Discontinued        1,500 mg 150 mL/hr over 120 Minutes Intravenous Every 24 hours 11/28/21 1008 11/30/21 1025   11/28/21 2200  vancomycin (VANCOREADY) IVPB 1750 mg/350 mL  Status:  Discontinued        1,750 mg 175 mL/hr over 120 Minutes Intravenous Every 24 hours 11/27/21 2101 11/28/21 1008   11/28/21 1000  fluconazole (DIFLUCAN) tablet 100 mg       Note to Pharmacy: Take 2 tablets today, then 1 tablet daily x 20 more days.     100 mg Oral Daily 11/28/21 0814     11/27/21 2115  ceFEPIme (MAXIPIME) 2 g in sodium chloride 0.9 % 100 mL IVPB  Status:  Discontinued        2 g 200 mL/hr over 30 Minutes Intravenous Every 8 hours 11/27/21 2044 11/30/21 0945   11/27/21 2100  vancomycin (VANCOREADY) IVPB 2000 mg/400 mL        2,000 mg 200 mL/hr over 120 Minutes Intravenous  Once 11/27/21 2025 11/28/21 0315   11/27/21 1845  ceFAZolin (ANCEF) IVPB 2g/100 mL premix  Status:  Discontinued        2 g 200 mL/hr over 30 Minutes Intravenous  Once 11/27/21 1831 11/27/21 1951       Objective: Vitals:   12/03/21 0840 12/03/21 0900  BP: (!) 124/92   Pulse: (!) 116 87  Resp: 18   Temp: (!) 97.4 F (36.3 C) 98.2 F (36.8 C)  SpO2: 99%     Intake/Output Summary (Last 24 hours) at 12/03/2021 1116 Last data filed at 12/03/2021 0400 Gross per 24 hour  Intake 568.82 ml  Output --  Net 568.82 ml   Filed Weights   11/30/21 0500 12/01/21 0535 12/03/21 0413  Weight: 94.2 kg 94.1 kg 91.1 kg   Weight change:  Body mass index is 29.66 kg/m.   Physical Exam: General exam: Pleasant middle-aged Caucasian male.  Looks older for his age. Skin: No rashes,  lesions or ulcers. HEENT: Atraumatic, normocephalic, no obvious bleeding Lungs:  Air entry in both bases improving today.  Coughs on deep breathing.   CVS: Mild tachycardia, regular rhythm, no murmur GI/Abd soft, nontender, nondistended, bowel sound present CNS: Alert, awake, oriented x3 Psychiatry: Sad affect Extremities: No pedal edema, no calf tenderness  Data Review: I have personally reviewed the laboratory data and studies available.  F/u labs ordered Unresulted Labs (From admission, onward)     Start     Ordered   12/03/21 0802  Basic metabolic panel  Daily,   R     Question:  Specimen collection method  Answer:  Lab=Lab collect   12/02/21 1751   12/01/21 0500  Heparin level (unfractionated)  Daily,   R     Question:  Specimen collection method  Answer:  Lab=Lab collect   11/29/21 1943   11/30/21 0500  CBC  Daily,   R     Question:  Specimen collection method  Answer:  Lab=Lab collect   11/29/21 0957   11/27/21 2013  Expectorated Sputum Assessment w Gram Stain, Rflx to Resp Cult  Once,   R        11/27/21 2013   11/27/21 2010  Strep pneumoniae urinary antigen  (COPD / Pneumonia / Cellulitis / Lower Extremity Wound)  Once,   R        11/27/21 2013   11/27/21 1719  Urinalysis, Routine w reflex microscopic  Once,   STAT        11/27/21 1718            Signed, Terrilee Croak, MD [facility] 12/03/2021

## 2021-12-03 NOTE — Consult Note (Signed)
Reason for Consult: Left septic olecranon bursitis Referring Physician: [facility] hospitalist  Christopher Randall is an 60 y.o. male.  HPI: Patient is admitted to hospital and ready to go to rehab.  He noted some discomfort in the left posterior aspect of his elbow while in the ER.  There is concern for septic olecranon bursitis and aspirations have been performed.  He has been on antibiotics.  Unfortunately look like he was not responding to antibiotics and he was booked for surgery.  Today I saw the patient and he reports improvement in his symptoms.  The redness is improving, the swelling is improving and the pain is improving.  This is encouraging.  His wife is present at the bedside and corroborates his story.  Past Medical History:  Diagnosis Date   Abnormal TSH 09/28/2017   Anemia 11/27/2021   Anxiety 07/03/2012   Depression    Difficulty sleeping 09/28/2017   Examination of participant in clinical trial 09/11/2012   History of immunotherapy 02/23/2016   Melanoma of skin 10/26/2011   Nicotine dependence 07/17/2015   Smoker    STEMI (ST elevation myocardial infarction) ([place]) 11/27/2021    Past Surgical History:  Procedure Laterality Date   BRONCHIAL BIOPSY  10/09/2021   Procedure: BRONCHIAL BIOPSIES;  Surgeon: [name], MD;  Location: [facility];  Service: Endoscopy;;   BRONCHIAL BRUSHINGS  10/09/2021   Procedure: BRONCHIAL BRUSHINGS;  Surgeon: [name], MD;  Location: [facility];  Service: Endoscopy;;   ENDOBRONCHIAL ULTRASOUND N/A 10/09/2021   Procedure: ENDOBRONCHIAL ULTRASOUND;  Surgeon: [name], MD;  Location: [facility];  Service: Endoscopy;  Laterality: N/A;   FINE NEEDLE ASPIRATION  10/09/2021   Procedure: FINE NEEDLE ASPIRATION (FNA) LINEAR;  Surgeon: [name], MD;  Location: [facility];  Service: Endoscopy;;   LEFT POPLITEAL MASS RESECTION  08/27/2011   pigmented villonodular synovitis, negative for malignancy   LYMPH GLAND  EXCISION  07/12/2011   right axilla, consistent with melanoma   LYMPH NODE DISSECTION  08/27/2011   right, 0/17 nodes positive   VIDEO BRONCHOSCOPY N/A 10/09/2021   Procedure: VIDEO BRONCHOSCOPY WITHOUT FLUORO;  Surgeon: [name], MD;  Location: [facility];  Service: Endoscopy;  Laterality: N/A;    Family History  Problem Relation Age of Onset   Hypertension Mother    CAD Mother    Lung cancer Father     Social History:  reports that he has quit smoking. His smoking use included cigarettes. He smoked an average of .25 packs per day. He has never used smokeless tobacco. He reports current alcohol use of about 6.0 standard drinks per week. He reports that he does not use drugs.  Allergies:  Allergies  Allergen Reactions   Iopamidol Hives and Itching    Isovue break through with prep, Hives and Itching  Patient had PET?CT scan with 125 mls of Isovue-300. Noted to have two raised itchy hives which self-resolved. Patient had PET/CT scan with contrast on 05/27/2014 while on prednisone prep (3 doses of 50 mg-13 hour prep) and noted to have two hives which self -resolved without treatment   Iodinated Diagnostic Agents Rash   Iodine-131 Rash    Medications: I have reviewed the patient's current medications.  Results for orders placed or performed during the hospital encounter of 11/27/21 (from the past 48 hour(s))  Heparin level (unfractionated)     Status: Abnormal   Collection Time: 12/01/21  9:26 AM  Result Value Ref Range   Heparin Unfractionated  0.28 (L) 0.30 - 0.70 IU/mL    Comment: (NOTE) The clinical reportable range upper limit is being lowered to >1.10 to align with the FDA approved guidance for the current laboratory assay.  If heparin results are below expected values, and patient dosage has  been confirmed, suggest follow up testing of antithrombin III levels. Performed at [facility], [place] [address], [place], [place] [address]   Heparin level  (unfractionated)     Status: None   Collection Time: 12/01/21  7:43 PM  Result Value Ref Range   Heparin Unfractionated 0.34 0.30 - 0.70 IU/mL    Comment: (NOTE) The clinical reportable range upper limit is being lowered to >1.10 to align with the FDA approved guidance for the current laboratory assay.  If heparin results are below expected values, and patient dosage has  been confirmed, suggest follow up testing of antithrombin III levels. Performed at [facility], [place] [address] [place], [place] [address]   Heparin level (unfractionated)     Status: None   Collection Time: 12/02/21  4:52 AM  Result Value Ref Range   Heparin Unfractionated 0.42 0.30 - 0.70 IU/mL    Comment: (NOTE) The clinical reportable range upper limit is being lowered to >1.10 to align with the FDA approved guidance for the current laboratory assay.  If heparin results are below expected values, and patient dosage has  been confirmed, suggest follow up testing of antithrombin III levels. Performed at [facility], [place] [address], [place], [place] [address]   Comprehensive metabolic panel     Status: Abnormal   Collection Time: 12/02/21  4:52 AM  Result Value Ref Range   Sodium 126 (L) 135 - 145 mmol/L   Potassium 3.5 3.5 - 5.1 mmol/L   Chloride 90 (L) 98 - 111 mmol/L   CO2 27 22 - 32 mmol/L   Glucose, Bld 97 70 - 99 mg/dL    Comment: Glucose reference range applies only to samples taken after fasting for at least 8 hours.   BUN 14 6 - 20 mg/dL   Creatinine, Ser 0.72 0.61 - 1.24 mg/dL   Calcium 7.9 (L) 8.9 - 10.3 mg/dL   Total Protein 4.8 (L) 6.5 - 8.1 g/dL   Albumin 1.8 (L) 3.5 - 5.0 g/dL   AST 45 (H) 15 - 41 U/L   ALT 49 (H) 0 - 44 U/L   Alkaline Phosphatase 105 38 - 126 U/L   Total Bilirubin 0.4 0.3 - 1.2 mg/dL   GFR, Estimated >60 >60 mL/min    Comment: (NOTE) Calculated using the CKD-EPI Creatinine Equation (2021)    Anion gap 9 5 - 15    Comment: Performed at [facility], [place] [address], [place], [place] [address]  CBC with Differential/Platelet     Status: Abnormal   Collection Time: 12/02/21  4:52 AM  Result Value Ref Range   WBC 40.8 (H) 4.0 - 10.5 K/uL   RBC 3.27 (L) 4.22 - 5.81 MIL/uL   Hemoglobin 10.2 (L) 13.0 - 17.0 g/dL   HCT 29.6 (L) 39.0 - 52.0 %   MCV 90.5 80.0 - 100.0 fL   MCH 31.2 26.0 - 34.0 pg   MCHC 34.5 30.0 - 36.0 g/dL   RDW 15.6 (H) 11.5 - 15.5 %   Platelets 89 (L) 150 - 400 K/uL    Comment: Immature Platelet Fraction may be clinically indicated, consider ordering this additional test [id number] CONSISTENT WITH PREVIOUS RESULT  REPEATED TO VERIFY    nRBC 0.1 0.0 - 0.2 %   Neutrophils Relative % 84 %   Neutro Abs 34.1 (H) 1.7 - 7.7 K/uL   Lymphocytes Relative 3 %   Lymphs Abs 1.4 0.7 - 4.0 K/uL   Monocytes Relative 3 %   Monocytes Absolute 1.3 (H) 0.1 - 1.0 K/uL   Eosinophils Relative 0 %   Eosinophils Absolute 0.1 0.0 - 0.5 K/uL   Basophils Relative 0 %   Basophils Absolute 0.1 0.0 - 0.1 K/uL   WBC Morphology      MODERATE LEFT SHIFT (>5% METAS AND MYELOS,OCC PRO NOTED)    Comment: TOXIC GRANULATION VACUOLATED NEUTROPHILS    RBC Morphology See Note    Smear Review PLATELETS APPEAR DECREASED    Immature Granulocytes 10 %   Abs Immature Granulocytes 4.02 (H) 0.00 - 0.07 K/uL   Burr Cells PRESENT     Comment: Performed at [facility], [place] [address] [place], [place] [address]  Magnesium     Status: None   Collection Time: 12/02/21  4:52 AM  Result Value Ref Range   Magnesium 1.8 1.7 - 2.4 mg/dL    Comment: Performed at [facility] [address], [place], [place] [address]  Surgical PCR screen     Status: Abnormal   Collection Time: 12/02/21  8:50 PM   Specimen: Nasal Mucosa; Nasal Swab  Result Value Ref Range   MRSA, PCR NEGATIVE NEGATIVE   Staphylococcus aureus POSITIVE (A) NEGATIVE    Comment: (NOTE) The Xpert SA Assay (FDA approved for NASAL specimens in patients 74 years of age and  older), is one component of a comprehensive surveillance program. It is not intended to diagnose infection nor to guide or monitor treatment. Performed at [facility], [place] [address], [place], [place] [address]   CBC     Status: Abnormal   Collection Time: 12/03/21  3:31 AM  Result Value Ref Range   WBC 40.5 (H) 4.0 - 10.5 K/uL   RBC 3.25 (L) 4.22 - 5.81 MIL/uL   Hemoglobin 10.2 (L) 13.0 - 17.0 g/dL   HCT 29.5 (L) 39.0 - 52.0 %   MCV 90.8 80.0 - 100.0 fL   MCH 31.4 26.0 - 34.0 pg   MCHC 34.6 30.0 - 36.0 g/dL   RDW 15.6 (H) 11.5 - 15.5 %   Platelets 90 (L) 150 - 400 K/uL    Comment: Immature Platelet Fraction may be clinically indicated, consider ordering this additional test [id number] CONSISTENT WITH PREVIOUS RESULT REPEATED TO VERIFY    nRBC 0.1 0.0 - 0.2 %    Comment: Performed at [facility], [place] [address] [place], [place] [address]  Heparin level (unfractionated)     Status: None   Collection Time: 12/03/21  3:31 AM  Result Value Ref Range   Heparin Unfractionated 0.37 0.30 - 0.70 IU/mL    Comment: (NOTE) The clinical reportable range upper limit is being lowered to >1.10 to align with the FDA approved guidance for the current laboratory assay.  If heparin results are below expected values, and patient dosage has  been confirmed, suggest follow up testing of antithrombin III levels. Performed at [facility], [place] [address], [place], [place] [address]   Basic metabolic panel     Status: Abnormal   Collection Time: 12/03/21  3:31 AM  Result Value Ref Range   Sodium 126 (L) 135 - 145 mmol/L   Potassium  3.7 3.5 - 5.1 mmol/L   Chloride 90 (L) 98 - 111 mmol/L   CO2 24 22 - 32 mmol/L   Glucose, Bld 93 70 - 99 mg/dL    Comment: Glucose reference range applies only to samples taken after fasting for at least 8 hours.   BUN 12 6 - 20 mg/dL   Creatinine, Ser 0.78 0.61 - 1.24 mg/dL   Calcium 8.0 (L) 8.9 - 10.3 mg/dL   GFR, Estimated >60 >60 mL/min     Comment: (NOTE) Calculated using the CKD-EPI Creatinine Equation (2021)    Anion gap 12 5 - 15    Comment: Performed at [facility] [address] [place], [place] [address]    CT CHEST WO CONTRAST  Result Date: 12/02/2021 CLINICAL DATA:  Suspected enlarging right hilar mass with multifocal pneumonia/edema. Multifocal adenopathy on prior CT. EXAM: CT CHEST WITHOUT CONTRAST TECHNIQUE: Multidetector CT imaging of the chest was performed following the standard protocol without IV contrast. COMPARISON:  CT with IV contrast 10/07/2021 FINDINGS: Cardiovascular: Mild panchamber cardiomegaly is increased since the prior study is also circumferential pericardial effusion now 1.5 cm in thickness, previously no more than 6 mm. There is interval increased distention of the superior pulmonary veins and of the pulmonary trunk with the pulmonary trunk 3.5 cm indicating arterial hypertension. There is patchy calcification in the left main, LAD and circumflex coronary arteries, mild calcification in the aorta and branch vessels, stably ectatic aortic root and ascending segment both measuring 4.2 cm with the remainder of the aorta normal caliber. Mediastinum/Nodes: Adenopathy is difficult to evaluate given the lack of IV contrast but there is notable worsening. An index left supraclavicular lymph node now measures 2.1 x 2.5 cm on series 3 axial 7, previously 2.2 x 2.2 cm. Axillary surgical clips are noted in the right but no axillary adenopathy. In the mediastinum, extensive adenopathy has worsened in the interval with lobular adenopathy along both hila also increased. The hilar adenopathy is difficult to separate from adjacent vessels, but an index right paratracheal lymph node now measures 3.5 x 3.4 cm on axial 7, was previously 2.7 x 2.2 cm, with an index AP window lymph node now 3.1 x 2.4 cm previously 1.7 x 1.2 cm, left-sided precarinal nodal complex now 3.3 x 1.6 cm on axial 59 and previously 1.5 x 0.9 cm;  and a conglomerate, centrally necrotic subcarinal mass is now 4.7 x 4.1 cm on axial 73 mm previously 4.3 x 3.2 cm. No mass is seen visualized thyroid gland. The tracheal air column is patent. No focal esophageal thickening. Lungs/Pleura: There is a perihilar mass in the base of the right upper lobe centered medially but extending to the periphery and as before, is difficult to separate from adjacent atelectasis but has also enlarged, now measuring estimated 8.5 x 5.4 cm, previously 5.6 x 4.0 cm, again completely collapsing the right middle lobe first order bronchi and likely also the right lower lobe interlobar pulmonary artery. There is interval new demonstration of a moderate right and small left layering pleural effusions with a small amount of the right pleural fluid loculated anteriorly. There is interlobular septal thickening in the lung bases and apices also increased. Paraseptal emphysematous changes in the apices are again noted. Most of the right lower lobe is collapsed by the right pleural effusion. Elsewhere there are interstitial and dense ground-glass opacities in a patchy distribution in the upper lobes with small amounts in the lower lobes, in the left lower lobe  primarily in the periphery. I believe the right middle lobe is probably collapsed due to the mass. Upper Abdomen: There is increased adenopathy in the porta hepatis and gastrohepatic ligament as well as in the aortocaval space and portacaval space. Without contrast no mass in the visualized liver seen , no adrenal mass. Musculoskeletal: No destructive osseous or lytic lesions are observed. IMPRESSION: 1. Worsening of extensive mediastinal and hilar adenopathy with enlarging left supraclavicular and abdominal retroperitoneal nodes. 2. Enlarging right upper lobe mass which now appears to collapse the right middle lobe and effaces its proximal bronchi. 3. Cardiomegaly with increased pericardial effusion, increased venous distension, moderate  right and small left pleural effusions, and interstitial edema in the lung apices and bases. 4. Interstitial and ground-glass opacities in the upper greater than lower lobes consistent with edema, pneumonia or combination. Alternatively lymphangitic carcinomatosis. 5. Aortic and coronary artery atherosclerosis with 4.2 cm ectasia of the aortic root and ascending segment, and increased prominence of the pulmonary trunk. Electronically Signed   By: Telford Nab M.D.   On: 12/02/2021 03:44    Review of Systems  Constitutional: Negative.   HENT: Negative.    Respiratory: Negative.    Cardiovascular: Negative.   Gastrointestinal: Negative.   Musculoskeletal:        Left elbow pain  All other systems reviewed and are negative. Blood pressure (!) 164/84, pulse (!) 105, temperature 98.1 F (36.7 C), resp. rate 17, height 5\' 9"  (1.753 m), weight 91.1 kg, SpO2 97 %. Physical Exam HENT:     Head: Normocephalic.     Mouth/Throat:     Mouth: Mucous membranes are dry.  Eyes:     Extraocular Movements: Extraocular movements intact.  Cardiovascular:     Rate and Rhythm: Tachycardia present.  Pulmonary:     Effort: Pulmonary effort is normal.  Abdominal:     Palpations: Abdomen is soft.  Musculoskeletal:     Cervical back: Neck supple.     Comments: Left elbow with swelling posteriorly.  Evidence of prior aspirations.  Wrinkles present.  Mild tenderness to palpation.  Is able to range the elbow without discomfort.  Slight erythema but no streaking erythema.  Minimal warmth to palpation.  Neurological:     General: No focal deficit present.     Mental Status: He is alert.  Psychiatric:        Mood and Affect: Mood normal.    Assessment/Plan: Left septic olecranon bursitis improving after aspiration and antibiotics.  We will hold off on surgery for now as he is improving.  This is encouraging.  We did discuss that he may require surgery if the symptoms do not improve fully but I am hopeful given  that he has significant reduction in swelling and pain.  The warmth and redness is also improving.  My recommendation would be for a light compressive wrap about the elbow with continued antibiotics.  Cultures are pending final result but we will plan to treat this conservatively with antibiotic therapy.  He is safe at this point for discharge and will follow up with me if needed for his elbow.  Okay for diet this a.m. and discharge when appropriate per hospitalist team.  Erle Crocker 12/03/2021, 7:32 AM

## 2021-12-03 NOTE — Progress Notes (Signed)
Inpatient Rehabilitation Admissions Coordinator   CIR bed is not available to admit today. I met with patient and his wife and they are aware that I am hopeful for bed Friday.  Danne Baxter, RN, MSN Rehab Admissions Coordinator [phone] 12/03/2021 11:09 AM

## 2021-12-03 NOTE — Plan of Care (Signed)
°  Problem: Education: Goal: Knowledge of disease or condition will improve Outcome: Progressing Goal: Knowledge of secondary prevention will improve (SELECT ALL) Outcome: Progressing Goal: Knowledge of patient specific risk factors will improve (INDIVIDUALIZE FOR PATIENT) Outcome: Progressing Goal: Individualized Educational Video(s) Outcome: Progressing   Problem: Coping: Goal: Will verbalize positive feelings about self Outcome: Progressing Goal: Will identify appropriate support needs Outcome: Progressing   Problem: Health Behavior/Discharge Planning: Goal: Ability to manage health-related needs will improve Outcome: Progressing   Problem: Self-Care: Goal: Ability to participate in self-care as condition permits will improve Outcome: Progressing Goal: Verbalization of feelings and concerns over difficulty with self-care will improve Outcome: Progressing Goal: Ability to communicate needs accurately will improve Outcome: Progressing   Problem: Nutrition: Goal: Risk of aspiration will decrease Outcome: Progressing Goal: Dietary intake will improve Outcome: Progressing   Problem: Education: Goal: Knowledge of General Education information will improve Description: Including pain rating scale, medication(s)/side effects and non-pharmacologic comfort measures Outcome: Progressing   Problem: Health Behavior/Discharge Planning: Goal: Ability to manage health-related needs will improve Outcome: Progressing   Problem: Clinical Measurements: Goal: Ability to maintain clinical measurements within normal limits will improve Outcome: Progressing Goal: Will remain free from infection Outcome: Progressing Goal: Diagnostic test results will improve Outcome: Progressing Goal: Respiratory complications will improve Outcome: Progressing Goal: Cardiovascular complication will be avoided Outcome: Progressing   Problem: Activity: Goal: Risk for activity intolerance will  decrease Outcome: Progressing   Problem: Nutrition: Goal: Adequate nutrition will be maintained Outcome: Progressing   Problem: Coping: Goal: Level of anxiety will decrease Outcome: Progressing   Problem: Elimination: Goal: Will not experience complications related to bowel motility Outcome: Progressing Goal: Will not experience complications related to urinary retention Outcome: Progressing   Problem: Pain Managment: Goal: General experience of comfort will improve Outcome: Progressing   Problem: Safety: Goal: Ability to remain free from injury will improve Outcome: Progressing   Problem: Skin Integrity: Goal: Risk for impaired skin integrity will decrease Outcome: Progressing

## 2021-12-04 ENCOUNTER — Inpatient Hospital Stay (HOSPITAL_COMMUNITY)
Admission: RE | Admit: 2021-12-04 | Discharge: 2021-12-11 | DRG: 945 | Disposition: A | Discharge status: Other Acute Inpatient Hospital | Payer: BC Managed Care – PPO | Source: Intra-hospital | Attending: Physical Medicine & Rehabilitation | Admitting: Physical Medicine & Rehabilitation

## 2021-12-04 ENCOUNTER — Ambulatory Visit
Admission: RE | Admit: 2021-12-04 | Discharge: 2021-12-04 | Disposition: A | Discharge status: Home / Self Care | Payer: BC Managed Care – PPO | Source: Ambulatory Visit | Attending: Radiation Oncology | Admitting: Radiation Oncology

## 2021-12-04 ENCOUNTER — Encounter (HOSPITAL_COMMUNITY): Payer: Self-pay | Admitting: Physical Medicine & Rehabilitation

## 2021-12-04 ENCOUNTER — Encounter: Payer: Self-pay | Admitting: Radiation Oncology

## 2021-12-04 ENCOUNTER — Other Ambulatory Visit: Payer: Self-pay

## 2021-12-04 ENCOUNTER — Ambulatory Visit
Admit: 2021-12-04 | Discharge: 2021-12-04 | Disposition: A | Discharge status: Home / Self Care | Payer: BC Managed Care – PPO | Attending: Radiation Oncology | Admitting: Radiation Oncology

## 2021-12-04 DIAGNOSIS — Z9221 Personal history of antineoplastic chemotherapy: Secondary | ICD-10-CM

## 2021-12-04 DIAGNOSIS — Z8616 Personal history of COVID-19: Secondary | ICD-10-CM | POA: Diagnosis not present

## 2021-12-04 DIAGNOSIS — I6340 Cerebral infarction due to embolism of unspecified cerebral artery: Secondary | ICD-10-CM

## 2021-12-04 DIAGNOSIS — C3491 Malignant neoplasm of unspecified part of right bronchus or lung: Secondary | ICD-10-CM | POA: Diagnosis present

## 2021-12-04 DIAGNOSIS — R918 Other nonspecific abnormal finding of lung field: Secondary | ICD-10-CM

## 2021-12-04 DIAGNOSIS — Z7901 Long term (current) use of anticoagulants: Secondary | ICD-10-CM | POA: Diagnosis not present

## 2021-12-04 DIAGNOSIS — I252 Old myocardial infarction: Secondary | ICD-10-CM

## 2021-12-04 DIAGNOSIS — Z8673 Personal history of transient ischemic attack (TIA), and cerebral infarction without residual deficits: Secondary | ICD-10-CM

## 2021-12-04 DIAGNOSIS — I824Y3 Acute embolism and thrombosis of unspecified deep veins of proximal lower extremity, bilateral: Secondary | ICD-10-CM

## 2021-12-04 DIAGNOSIS — F419 Anxiety disorder, unspecified: Secondary | ICD-10-CM | POA: Diagnosis present

## 2021-12-04 DIAGNOSIS — F329 Major depressive disorder, single episode, unspecified: Secondary | ICD-10-CM

## 2021-12-04 DIAGNOSIS — Z86718 Personal history of other venous thrombosis and embolism: Secondary | ICD-10-CM | POA: Diagnosis not present

## 2021-12-04 DIAGNOSIS — B3781 Candidal esophagitis: Secondary | ICD-10-CM | POA: Diagnosis present

## 2021-12-04 DIAGNOSIS — B370 Candidal stomatitis: Secondary | ICD-10-CM | POA: Diagnosis present

## 2021-12-04 DIAGNOSIS — D72829 Elevated white blood cell count, unspecified: Secondary | ICD-10-CM | POA: Diagnosis present

## 2021-12-04 DIAGNOSIS — D696 Thrombocytopenia, unspecified: Secondary | ICD-10-CM | POA: Diagnosis present

## 2021-12-04 DIAGNOSIS — R5381 Other malaise: Principal | ICD-10-CM | POA: Diagnosis present

## 2021-12-04 DIAGNOSIS — I5181 Takotsubo syndrome: Secondary | ICD-10-CM | POA: Diagnosis present

## 2021-12-04 DIAGNOSIS — Z87891 Personal history of nicotine dependence: Secondary | ICD-10-CM | POA: Diagnosis not present

## 2021-12-04 DIAGNOSIS — E43 Unspecified severe protein-calorie malnutrition: Secondary | ICD-10-CM | POA: Diagnosis present

## 2021-12-04 DIAGNOSIS — L03116 Cellulitis of left lower limb: Secondary | ICD-10-CM | POA: Diagnosis not present

## 2021-12-04 DIAGNOSIS — E785 Hyperlipidemia, unspecified: Secondary | ICD-10-CM | POA: Diagnosis present

## 2021-12-04 DIAGNOSIS — C3490 Malignant neoplasm of unspecified part of unspecified bronchus or lung: Secondary | ICD-10-CM | POA: Diagnosis not present

## 2021-12-04 DIAGNOSIS — L89891 Pressure ulcer of other site, stage 1: Secondary | ICD-10-CM | POA: Diagnosis present

## 2021-12-04 DIAGNOSIS — R0902 Hypoxemia: Secondary | ICD-10-CM | POA: Diagnosis present

## 2021-12-04 DIAGNOSIS — I82493 Acute embolism and thrombosis of other specified deep vein of lower extremity, bilateral: Secondary | ICD-10-CM | POA: Diagnosis not present

## 2021-12-04 DIAGNOSIS — R339 Retention of urine, unspecified: Secondary | ICD-10-CM | POA: Diagnosis present

## 2021-12-04 DIAGNOSIS — Z8249 Family history of ischemic heart disease and other diseases of the circulatory system: Secondary | ICD-10-CM | POA: Diagnosis not present

## 2021-12-04 DIAGNOSIS — Z923 Personal history of irradiation: Secondary | ICD-10-CM

## 2021-12-04 DIAGNOSIS — D6869 Other thrombophilia: Secondary | ICD-10-CM | POA: Diagnosis present

## 2021-12-04 DIAGNOSIS — C7931 Secondary malignant neoplasm of brain: Secondary | ICD-10-CM | POA: Diagnosis present

## 2021-12-04 DIAGNOSIS — Z801 Family history of malignant neoplasm of trachea, bronchus and lung: Secondary | ICD-10-CM | POA: Diagnosis not present

## 2021-12-04 DIAGNOSIS — Z85820 Personal history of malignant melanoma of skin: Secondary | ICD-10-CM | POA: Diagnosis not present

## 2021-12-04 DIAGNOSIS — M71122 Other infective bursitis, left elbow: Secondary | ICD-10-CM | POA: Diagnosis present

## 2021-12-04 DIAGNOSIS — G4700 Insomnia, unspecified: Secondary | ICD-10-CM | POA: Diagnosis present

## 2021-12-04 DIAGNOSIS — L03115 Cellulitis of right lower limb: Secondary | ICD-10-CM | POA: Diagnosis not present

## 2021-12-04 DIAGNOSIS — I739 Peripheral vascular disease, unspecified: Secondary | ICD-10-CM | POA: Diagnosis not present

## 2021-12-04 DIAGNOSIS — Z7902 Long term (current) use of antithrombotics/antiplatelets: Secondary | ICD-10-CM

## 2021-12-04 DIAGNOSIS — Z79899 Other long term (current) drug therapy: Secondary | ICD-10-CM

## 2021-12-04 DIAGNOSIS — E871 Hypo-osmolality and hyponatremia: Secondary | ICD-10-CM | POA: Diagnosis present

## 2021-12-04 DIAGNOSIS — C3411 Malignant neoplasm of upper lobe, right bronchus or lung: Secondary | ICD-10-CM | POA: Insufficient documentation

## 2021-12-04 DIAGNOSIS — L8990 Pressure ulcer of unspecified site, unspecified stage: Secondary | ICD-10-CM | POA: Insufficient documentation

## 2021-12-04 LAB — CBC
HCT: 30.3 % — ABNORMAL LOW (ref 39.0–52.0)
Hemoglobin: 10.7 g/dL — ABNORMAL LOW (ref 13.0–17.0)
MCH: 31.8 pg (ref 26.0–34.0)
MCHC: 35.3 g/dL (ref 30.0–36.0)
MCV: 89.9 fL (ref 80.0–100.0)
Platelets: 70 10*3/uL — ABNORMAL LOW (ref 150–400)
RBC: 3.37 MIL/uL — ABNORMAL LOW (ref 4.22–5.81)
RDW: 15.9 % — ABNORMAL HIGH (ref 11.5–15.5)
WBC: 40.2 10*3/uL — ABNORMAL HIGH (ref 4.0–10.5)
nRBC: 0.1 % (ref 0.0–0.2)

## 2021-12-04 LAB — BASIC METABOLIC PANEL
Anion gap: 12 (ref 5–15)
BUN: 20 mg/dL (ref 6–20)
CO2: 24 mmol/L (ref 22–32)
Calcium: 7.9 mg/dL — ABNORMAL LOW (ref 8.9–10.3)
Chloride: 88 mmol/L — ABNORMAL LOW (ref 98–111)
Creatinine, Ser: 0.92 mg/dL (ref 0.61–1.24)
GFR, Estimated: >60 mL/min (ref >60)
Glucose, Bld: 102 mg/dL — ABNORMAL HIGH (ref 70–99)
Potassium: 3.5 mmol/L (ref 3.5–5.1)
Sodium: 124 mmol/L — ABNORMAL LOW (ref 135–145)

## 2021-12-04 MED ORDER — SILVER SULFADIAZINE 1 % EX CREA: TOPICAL_CREAM | Freq: Two times a day (BID) | CUTANEOUS | 0 refills | Status: AC

## 2021-12-04 MED ORDER — FUROSEMIDE 40 MG PO TABS
40.0000 mg | ORAL_TABLET | Freq: Every day | ORAL | Status: DC
  Administered 2021-12-05 – 2021-12-10 (×6): 40 mg via ORAL
  Filled 2021-12-04 (×6): qty 1

## 2021-12-04 MED ORDER — POLYETHYLENE GLYCOL 3350 17 G PO PACK
17.0000 g | PACK | Freq: Every day | ORAL | Status: DC | PRN
  Administered 2021-12-05: 17 g via ORAL
  Filled 2021-12-04: qty 1

## 2021-12-04 MED ORDER — CLONAZEPAM 0.5 MG PO TABS
0.5000 mg | ORAL_TABLET | Freq: Two times a day (BID) | ORAL | Status: DC | PRN
  Administered 2021-12-06: 03:00:00 0.5 mg via ORAL
  Filled 2021-12-04: qty 1

## 2021-12-04 MED ORDER — APIXABAN 5 MG PO TABS: 5.0000 mg | ORAL_TABLET | Freq: Two times a day (BID) | ORAL | Status: DC

## 2021-12-04 MED ORDER — GUAIFENESIN ER 600 MG PO TB12
1200.0000 mg | ORAL_TABLET | Freq: Two times a day (BID) | ORAL | Status: DC
  Administered 2021-12-04 – 2021-12-11 (×14): 1200 mg via ORAL
  Filled 2021-12-04 (×14): qty 2

## 2021-12-04 MED ORDER — MEGESTROL ACETATE 40 MG PO TABS
160.0000 mg | ORAL_TABLET | Freq: Every day | ORAL | Status: DC
  Filled 2021-12-04: qty 4

## 2021-12-04 MED ORDER — IPRATROPIUM-ALBUTEROL 0.5-2.5 (3) MG/3ML IN SOLN
3.0000 mL | Freq: Four times a day (QID) | RESPIRATORY_TRACT | Status: DC | PRN
  Administered 2021-12-09 (×2): 3 mL via RESPIRATORY_TRACT
  Filled 2021-12-04 (×2): qty 3

## 2021-12-04 MED ORDER — DEXAMETHASONE 2 MG PO TABS
1.0000 mg | ORAL_TABLET | Freq: Every day | ORAL | Status: DC
  Administered 2021-12-05 – 2021-12-11 (×7): 1 mg via ORAL
  Filled 2021-12-04 (×7): qty 1

## 2021-12-04 MED ORDER — FLUCONAZOLE 100 MG PO TABS
100.0000 mg | ORAL_TABLET | Freq: Every day | ORAL | Status: DC
  Administered 2021-12-05 – 2021-12-11 (×7): 100 mg via ORAL
  Filled 2021-12-04 (×7): qty 1

## 2021-12-04 MED ORDER — CLOPIDOGREL BISULFATE 75 MG PO TABS: 75.0000 mg | ORAL_TABLET | Freq: Every day | ORAL | Status: DC

## 2021-12-04 MED ORDER — ATORVASTATIN CALCIUM 40 MG PO TABS: 40.0000 mg | ORAL_TABLET | Freq: Every day | ORAL | Status: DC

## 2021-12-04 MED ORDER — CLOPIDOGREL BISULFATE 75 MG PO TABS
75.0000 mg | ORAL_TABLET | Freq: Every day | ORAL | Status: DC
  Administered 2021-12-05 – 2021-12-11 (×7): 75 mg via ORAL
  Filled 2021-12-04 (×7): qty 1

## 2021-12-04 MED ORDER — METOPROLOL TARTRATE 25 MG PO TABS: 12.5000 mg | ORAL_TABLET | Freq: Two times a day (BID) | ORAL | Status: DC

## 2021-12-04 MED ORDER — HYDROCODONE-ACETAMINOPHEN 5-325 MG PO TABS
1.0000 | ORAL_TABLET | ORAL | Status: DC | PRN
  Administered 2021-12-05: 2 via ORAL
  Administered 2021-12-08 – 2021-12-09 (×3): 1 via ORAL
  Administered 2021-12-10: 22:00:00 2 via ORAL
  Administered 2021-12-10: 04:00:00 1 via ORAL
  Filled 2021-12-04 (×2): qty 2
  Filled 2021-12-04 (×2): qty 1
  Filled 2021-12-04: qty 2
  Filled 2021-12-04: qty 1

## 2021-12-04 MED ORDER — GUAIFENESIN ER 600 MG PO TB12: 1200.0000 mg | ORAL_TABLET | Freq: Two times a day (BID) | ORAL | Status: AC

## 2021-12-04 MED ORDER — BISACODYL 10 MG RE SUPP: 10.0000 mg | Freq: Every day | RECTAL | Status: DC | PRN

## 2021-12-04 MED ORDER — CEFADROXIL 500 MG PO CAPS
1000.0000 mg | ORAL_CAPSULE | Freq: Two times a day (BID) | ORAL | Status: DC
  Administered 2021-12-04 – 2021-12-11 (×13): 1000 mg via ORAL
  Filled 2021-12-04 (×15): qty 2

## 2021-12-04 MED ORDER — PROCHLORPERAZINE MALEATE 5 MG PO TABS: 5.0000 mg | ORAL_TABLET | Freq: Four times a day (QID) | ORAL | Status: DC | PRN

## 2021-12-04 MED ORDER — MEGESTROL ACETATE 40 MG PO TABS: 160.0000 mg | ORAL_TABLET | Freq: Every day | ORAL | Status: AC

## 2021-12-04 MED ORDER — METOPROLOL TARTRATE 12.5 MG HALF TABLET
12.5000 mg | ORAL_TABLET | Freq: Two times a day (BID) | ORAL | Status: DC
  Administered 2021-12-04 – 2021-12-11 (×14): 12.5 mg via ORAL
  Filled 2021-12-04 (×14): qty 1

## 2021-12-04 MED ORDER — CHLORHEXIDINE GLUCONATE CLOTH 2 % EX PADS
6.0000 | MEDICATED_PAD | Freq: Every day | CUTANEOUS | Status: DC
  Administered 2021-12-05 – 2021-12-11 (×7): 6 via TOPICAL

## 2021-12-04 MED ORDER — ACETAMINOPHEN 325 MG PO TABS
325.0000 mg | ORAL_TABLET | ORAL | Status: DC | PRN
  Administered 2021-12-08 – 2021-12-09 (×2): 650 mg via ORAL
  Filled 2021-12-04 (×2): qty 2

## 2021-12-04 MED ORDER — ATORVASTATIN CALCIUM 40 MG PO TABS
40.0000 mg | ORAL_TABLET | Freq: Every day | ORAL | Status: DC
  Administered 2021-12-05 – 2021-12-11 (×7): 40 mg via ORAL
  Filled 2021-12-04 (×7): qty 1

## 2021-12-04 MED ORDER — FOLIC ACID 1 MG PO TABS
1.0000 mg | ORAL_TABLET | Freq: Every day | ORAL | Status: DC
  Administered 2021-12-05 – 2021-12-11 (×7): 1 mg via ORAL
  Filled 2021-12-04 (×7): qty 1

## 2021-12-04 MED ORDER — PROCHLORPERAZINE EDISYLATE 10 MG/2ML IJ SOLN
5.0000 mg | Freq: Four times a day (QID) | INTRAMUSCULAR | Status: DC | PRN
  Filled 2021-12-04: qty 2

## 2021-12-04 MED ORDER — MIRTAZAPINE 15 MG PO TABS
15.0000 mg | ORAL_TABLET | Freq: Every day | ORAL | Status: DC
Start: 2021-12-04 — End: 2021-12-06
  Administered 2021-12-04 – 2021-12-05 (×2): 15 mg via ORAL
  Filled 2021-12-04 (×2): qty 1

## 2021-12-04 MED ORDER — DIPHENHYDRAMINE HCL 12.5 MG/5ML PO ELIX: 12.5000 mg | ORAL_SOLUTION | Freq: Four times a day (QID) | ORAL | Status: DC | PRN

## 2021-12-04 MED ORDER — PANTOPRAZOLE SODIUM 40 MG PO TBEC
40.0000 mg | DELAYED_RELEASE_TABLET | Freq: Every day | ORAL | Status: DC
  Administered 2021-12-05 – 2021-12-10 (×6): 40 mg via ORAL
  Filled 2021-12-04 (×7): qty 1

## 2021-12-04 MED ORDER — MUPIROCIN 2 % EX OINT
1.0000 "application " | TOPICAL_OINTMENT | Freq: Two times a day (BID) | CUTANEOUS | Status: AC
  Administered 2021-12-04 – 2021-12-07 (×6): 1 via NASAL
  Filled 2021-12-04: qty 22

## 2021-12-04 MED ORDER — POLYVINYL ALCOHOL 1.4 % OP SOLN
1.0000 [drp] | Freq: Four times a day (QID) | OPHTHALMIC | Status: DC | PRN
  Filled 2021-12-04: qty 15

## 2021-12-04 MED ORDER — MUPIROCIN 2 % EX OINT: 1.0000 "application " | TOPICAL_OINTMENT | Freq: Two times a day (BID) | CUTANEOUS | 0 refills | Status: DC

## 2021-12-04 MED ORDER — CLONAZEPAM 0.5 MG PO TABS: 0.5000 mg | ORAL_TABLET | Freq: Two times a day (BID) | ORAL | 0 refills | Status: DC | PRN

## 2021-12-04 MED ORDER — POLYETHYLENE GLYCOL 3350 17 G PO PACK: 17.0000 g | PACK | Freq: Every day | ORAL | 0 refills | Status: AC | PRN

## 2021-12-04 MED ORDER — PROCHLORPERAZINE 25 MG RE SUPP: 12.5000 mg | Freq: Four times a day (QID) | RECTAL | Status: DC | PRN

## 2021-12-04 MED ORDER — CLOPIDOGREL BISULFATE 75 MG PO TABS
75.0000 mg | ORAL_TABLET | Freq: Every day | ORAL | Status: DC
Start: 2021-12-05 — End: 2021-12-12

## 2021-12-04 MED ORDER — APIXABAN 5 MG PO TABS
5.0000 mg | ORAL_TABLET | Freq: Two times a day (BID) | ORAL | Status: DC
  Administered 2021-12-04 – 2021-12-09 (×10): 5 mg via ORAL
  Filled 2021-12-04 (×10): qty 1

## 2021-12-04 MED ORDER — MAGIC MOUTHWASH W/LIDOCAINE
5.0000 mL | Freq: Four times a day (QID) | ORAL | Status: DC
  Administered 2021-12-04 – 2021-12-10 (×21): 5 mL via ORAL
  Filled 2021-12-04 (×18): qty 5

## 2021-12-04 MED ORDER — VITAMIN B-12 1000 MCG PO TABS
1000.0000 ug | ORAL_TABLET | Freq: Every day | ORAL | Status: DC
  Administered 2021-12-05 – 2021-12-11 (×7): 1000 ug via ORAL
  Filled 2021-12-04 (×7): qty 1

## 2021-12-04 MED ORDER — HYDROCODONE-ACETAMINOPHEN 5-325 MG PO TABS: 1.0000 | ORAL_TABLET | ORAL | 0 refills | Status: AC | PRN

## 2021-12-04 MED ORDER — CEFADROXIL 500 MG PO CAPS: 1000.0000 mg | ORAL_CAPSULE | Freq: Two times a day (BID) | ORAL | Status: DC

## 2021-12-04 MED ORDER — FLEET ENEMA 7-19 GM/118ML RE ENEM: 1.0000 | ENEMA | Freq: Once | RECTAL | Status: DC | PRN

## 2021-12-04 MED ORDER — SILVER SULFADIAZINE 1 % EX CREA
TOPICAL_CREAM | Freq: Two times a day (BID) | CUTANEOUS | Status: DC
  Filled 2021-12-04 (×3): qty 85

## 2021-12-04 MED ORDER — NICOTINE 7 MG/24HR TD PT24
7.0000 mg | MEDICATED_PATCH | Freq: Every day | TRANSDERMAL | Status: DC
  Administered 2021-12-05 – 2021-12-07 (×3): 7 mg via TRANSDERMAL
  Filled 2021-12-04 (×3): qty 1

## 2021-12-04 MED ORDER — LIDOCAINE HCL URETHRAL/MUCOSAL 2 % EX GEL: CUTANEOUS | Status: DC | PRN

## 2021-12-04 MED ORDER — ALUM & MAG HYDROXIDE-SIMETH 200-200-20 MG/5ML PO SUSP: 30.0000 mL | ORAL | Status: DC | PRN

## 2021-12-04 MED ORDER — FUROSEMIDE 40 MG PO TABS: 40.0000 mg | ORAL_TABLET | Freq: Every day | ORAL | Status: AC

## 2021-12-04 MED ORDER — TRAZODONE HCL 50 MG PO TABS
25.0000 mg | ORAL_TABLET | Freq: Every evening | ORAL | Status: DC | PRN
Start: 2021-12-04 — End: 2021-12-11
  Administered 2021-12-04 – 2021-12-10 (×3): 50 mg via ORAL
  Filled 2021-12-04 (×3): qty 1

## 2021-12-04 MED ORDER — IPRATROPIUM-ALBUTEROL 0.5-2.5 (3) MG/3ML IN SOLN: 3.0000 mL | Freq: Four times a day (QID) | RESPIRATORY_TRACT | Status: DC | PRN

## 2021-12-04 MED ORDER — PROSOURCE PLUS PO LIQD
30.0000 mL | Freq: Two times a day (BID) | ORAL | Status: DC
  Administered 2021-12-05 – 2021-12-10 (×12): 30 mL via ORAL
  Filled 2021-12-04 (×8): qty 30

## 2021-12-04 MED ORDER — GUAIFENESIN-DM 100-10 MG/5ML PO SYRP
5.0000 mL | ORAL_SOLUTION | Freq: Four times a day (QID) | ORAL | Status: DC | PRN
Start: 2021-12-04 — End: 2021-12-11

## 2021-12-04 NOTE — Progress Notes (Signed)
Inpatient Rehabilitation Admission Medication Review by a Pharmacist  A complete drug regimen review was completed for this patient to identify any potential clinically significant medication issues.  High Risk Drug Classes Is patient taking? Indication by Medication  Antipsychotic Yes PRN compazine N/V  Anticoagulant Yes Apixaban DVT/CVA  Antibiotic Yes Cefadroxil septic bursitis Fluconazole - Candida esophagitis  Opioid Yes PRN vicodin for pain  Antiplatelet Yes Plavix - CVA  Hypoglycemics/insulin No   Vasoactive Medication Yes Metoprolol - HTN  Chemotherapy No On hold  Other Yes Lipitor - HLD Lasix - Hyponatremia Protonix - GERD Nicotine patch - smoking Silvadene - wounds Klonopin - anxiety Dexamethasone - lung ca     Type of Medication Issue Identified Description of Issue Recommendation(s)  Drug Interaction(s) (clinically significant)     Duplicate Therapy     Allergy     No Medication Administration End Date     Incorrect Dose     Additional Drug Therapy Needed  Megace Dc due to hypercoagulable state  Significant med changes from prior encounter (inform family/care partners about these prior to discharge).    Other       Clinically significant medication issues were identified that warrant physician communication and completion of prescribed/recommended actions by midnight of the next day:  No  Name of provider notified for urgent issues identified:   Provider Method of Notification:     Pharmacist comments:   Time spent performing this drug regimen review (minutes):  [address] PharmD, [place], AAHIVP, CPP Infectious Disease Pharmacist 12/04/2021 6:10 PM

## 2021-12-04 NOTE — Progress Notes (Signed)
Inpatient Rehabilitation Admissions Coordinator   CIR bed is available today. I met with patient and his wife at bedside and they are in agreement. I have clarified that he will receive 10 days of radiation which I will arrange Care link transport from CIR to [facility] daily. Chemotherapy on hold per Dr Irene Limbo until after rehab. I will make the arrangements to admit today.  Danne Baxter, RN, MSN Rehab Admissions Coordinator [phone] 12/04/2021 12:06 PM

## 2021-12-04 NOTE — H&P (Signed)
Physical Medicine and Rehabilitation Admission H&P    CC: Functional deficits  HPI: Christopher Randall. Christopher Randall is a 60 year old male with history of melanoma, anxiety/depression, diagnosis of NSCLC with mets to the brain10/21/22, reports of hallucinations w/headaches since thanksgiving and recent initiation of chemo/immunotherapy/XRT. He was unable to proceed with full dose chemo due to thrombocytopenia.  He was admitted on 11/27/21 from Hem/onc office with edema BLE with cellulitis and drainage as well as 3-4 days of increased confusion since XRT. MRI brain done revealing numerous small foci of acute to subacute infarcts involving multiple vascular territories suggestive for central embolic source.  MRA brain showed moderate stenosis right ACA and loss of flow signal related to distal right-VA. BLE Doppler showed acute DVT right and left gastrocnemius vein.  Patient noted to have significant hyponatremia at admission with sodium 119, abnormal LFTs, leukocytosis with white count 67K and thrombocytopenia with platelets at 29K and chest x-ray done showing bilateral airspace disease with small right effusion concerning for multifocal pneumonia and he was started on IV antibiotics for PNA and cellulitis.  He also reported chest pain with elevated cardiac enzyme and was started on IV heparin.  2D echo showed EF 55 to 60% with hypokinesis of apex.  He was transfused with platelets and started on low-dose ASA and IV heparin.  Dr. Audie Box  was consulted for input due to STEMI and felt that patient with stress-induced cardiomyopathy, pt was not a candidate for invasive angiopathy and recommended medical management with plavix and BB.    Dr. Marin Olp was consulted for input and felt that low sodium could be from dehydration and treatment.  Dr. Erlinda Hong felt that stroke emboli right PE due to hypercoagulable state from advanced malignancy.  He reported left elbow pain with swelling on 12/12 which was concerning for septic  bursitis.  Elbow was aspirated and sent for culture with question of formal I&D.  Cultures were positive for abundant staph aureus and CT chest done 12/14 showing worsening of extensive mediastinal and hilar adenopathy with enlargement of supraclavicular and abdominal retroperitoneal nodes, enlarging RUL mass collapsing RML and effacing proximal bronchi, cardiomegaly with increased pericardial effusion and interstitial and groundglass opacities consistent with pneumonia and /or lymphangitic carcinomatosis. Antibiotics narrowed to Duricef x 2- 3 weeks  starting 12/15 per input from Dr. Juleen China and to follow up with ID in 2 weeks.  As patient had improvement in pain with decrease in edema after aspiration,  formal I&D was needed per Dr. Lucia Gaskins.  He was transitioned to Eliquis on 12/15    Review of Systems  Constitutional:  Negative for chills and fever.  HENT:  Negative for hearing loss.   Eyes:  Positive for blurred vision. Negative for double vision.       Boderline glaucoma and degenerative eye disease.  Respiratory:  Positive for cough, sputum production (increased in the past few days), shortness of breath and wheezing.   Cardiovascular:  Positive for leg swelling. Negative for chest pain.  Gastrointestinal:  Positive for nausea (more in the past 2 days). Negative for abdominal pain and constipation.  Genitourinary:  Negative for dysuria, flank pain and frequency.       Hesitancy --difficult to urinate and needs multiple maneuvers.   Musculoskeletal:  Positive for myalgias.  Skin:  Negative for itching and rash.  Neurological:  Positive for weakness. Negative for dizziness and headaches.  Psychiatric/Behavioral:  Positive for hallucinations (since thanksgiving and has been having conversations during sleep) and memory loss.  The patient has insomnia (anxiety and SOB--can't get breath. Fear of death/leaving family alone.).     Past Medical History:  Diagnosis Date   Abnormal TSH 09/28/2017    Anemia 11/27/2021   Anxiety 07/03/2012   Depression    Difficulty sleeping 09/28/2017   Examination of participant in clinical trial 09/11/2012   History of immunotherapy 02/23/2016   Melanoma of skin 10/26/2011   Nicotine dependence 07/17/2015   Smoker    STEMI (ST elevation myocardial infarction) ([place]) 11/27/2021    Past Surgical History:  Procedure Laterality Date   BRONCHIAL BIOPSY  10/09/2021   Procedure: BRONCHIAL BIOPSIES;  Surgeon: [name], MD;  Location: [facility];  Service: Endoscopy;;   BRONCHIAL BRUSHINGS  10/09/2021   Procedure: BRONCHIAL BRUSHINGS;  Surgeon: [name], MD;  Location: [facility];  Service: Endoscopy;;   ENDOBRONCHIAL ULTRASOUND N/A 10/09/2021   Procedure: ENDOBRONCHIAL ULTRASOUND;  Surgeon: [name], MD;  Location: [facility];  Service: Endoscopy;  Laterality: N/A;   FINE NEEDLE ASPIRATION  10/09/2021   Procedure: FINE NEEDLE ASPIRATION (FNA) LINEAR;  Surgeon: [name], MD;  Location: [facility];  Service: Endoscopy;;   LEFT POPLITEAL MASS RESECTION  08/27/2011   pigmented villonodular synovitis, negative for malignancy   LYMPH GLAND EXCISION  07/12/2011   right axilla, consistent with melanoma   LYMPH NODE DISSECTION  08/27/2011   right, 0/17 nodes positive   VIDEO BRONCHOSCOPY N/A 10/09/2021   Procedure: VIDEO BRONCHOSCOPY WITHOUT FLUORO;  Surgeon: [name], MD;  Location: [facility];  Service: Endoscopy;  Laterality: N/A;    Family History  Problem Relation Age of Onset   Hypertension Mother    CAD Mother    Lung cancer Father     Social History:  Married. 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  Has 5 children 77-46 years old who live locally. He reports that he has quit smoking. His smoking use included cigarettes. He smoked an average of 1.5 PPD and quit in Sept 2022. He has never used smokeless tobacco. He reports current alcohol use of about 6.0 standard drinks  per week. He reports that he does not use drugs.   Allergies  Allergen Reactions   Iopamidol Hives and Itching    Isovue break through with prep, Hives and Itching  Patient had PET?CT scan with 125 mls of Isovue-300. Noted to have two raised itchy hives which self-resolved. Patient had PET/CT scan with contrast on 05/27/2014 while on prednisone prep (3 doses of 50 mg-13 hour prep) and noted to have two hives which self -resolved without treatment   Iodinated Diagnostic Agents Rash   Iodine-131 Rash    Medications Prior to Admission  Medication Sig Dispense Refill   dexamethasone (DECADRON) 1 MG tablet Take 1 tablet (1 mg total) by mouth daily with breakfast. 30 tablet 1   Diclofenac Sodium 1.5 % SOLN Place one application on to the skin 2x daily. (Patient taking differently: Apply 1 each topically daily as needed (cramps & aches).) 1.5 Bottle 1   fluconazole (DIFLUCAN) 100 MG tablet Take 2 tablets today, then 1 tablet daily x 20 more days. (Patient taking differently: Take 100 mg by mouth daily.) 35 tablet 0   folic acid (FOLVITE) 1 MG tablet Take 1 tablet (1 mg total) by mouth daily. Start 7 days before pemetrexed chemotherapy. Continue until 21 days after pemetrexed completed. 100 tablet 3   furosemide (LASIX) 20 MG tablet Take 1 tablet (20 mg total)  by mouth daily. 30 tablet 0   magic mouthwash (lidocaine, diphenhydrAMINE, alum & mag hydroxide) suspension Swish and spit 5 mls by mouth every 4-6 hours as needed (Patient taking differently: Swish and spit 5 mLs daily as needed for mouth pain.) 240 mL 2   Multiple Vitamins-Minerals (ICAPS AREDS 2 PO) Take 1 capsule by mouth daily.     naphazoline-pheniramine (VISINE) 0.025-0.3 % ophthalmic solution 1 drop 4 (four) times daily as needed for eye irritation.     nicotine polacrilex (COMMIT) 4 MG lozenge Take 4 mg by mouth 2 (two) times daily as needed for smoking cessation.     omeprazole (PRILOSEC) 20 MG capsule Take daily while on steroids.  (Patient taking differently: 20 mg daily.) 60 capsule 0   ondansetron (ZOFRAN) 8 MG tablet Take 1 tablet (8 mg total) by mouth 2 (two) times daily as needed (Nausea or vomiting). Start if needed on the third day after cisplatin. (Patient taking differently: Take 8 mg by mouth 2 (two) times daily as needed for nausea or vomiting.) 30 tablet 1   prochlorperazine (COMPAZINE) 10 MG tablet Take 1 tablet (10 mg total) by mouth every 6 (six) hours as needed (Nausea or vomiting). (Patient taking differently: Take 10 mg by mouth every 6 (six) hours as needed for nausea or vomiting.) 30 tablet 1   sodium chloride (OCEAN) 0.65 % SOLN nasal spray Place 1 spray into both nostrils daily as needed for congestion.     vitamin B-12 (CYANOCOBALAMIN) 1000 MCG tablet Take 1,000 mcg by mouth daily.     lidocaine-prilocaine (EMLA) cream Apply to affected area once (Patient not taking: Reported on 11/27/2021) 30 g 3   oseltamivir (TAMIFLU) 75 MG capsule Take 1 capsule (75 mg total) by mouth 2 (two) times daily. (Patient not taking: Reported on 11/27/2021) 10 capsule 0    Drug Regimen Review  Drug regimen was reviewed and remains appropriate with no significant issues identified  Home: Home Living Family/patient expects to be discharged to:: Private residence Living Arrangements: Spouse/significant other Available Help at Discharge: Family, Available 24 hours/day Type of Home: House Home Access: Level entry Home Layout: Two level, Able to live on main level with bedroom/bathroom Bathroom Shower/Tub: Multimedia programmer: Handicapped height Bathroom Accessibility: Yes Home Equipment: Cane - quad, Shower seat Additional Comments: wife has taken FMLA to be with him. Children and other family live nearby as well   Functional History: Prior Function Prior Level of Function : Independent/Modified Independent, Driving Mobility Comments: independent until past couple of days. Just retired. Likes to mountain  bike, walks 5 mi/ day, goes to gym 5 days/wk but is a smoker ADLs Comments: independent  Functional Status:  Mobility: Bed Mobility Overal bed mobility: Needs Assistance Bed Mobility: Supine to Sit Supine to sit: Supervision General bed mobility comments: received in chair Transfers Overall transfer level: Needs assistance Equipment used: Rolling walker (2 wheels) Transfers: Sit to/from Stand Sit to Stand: Min guard General transfer comment: from chair x5 reps total Ambulation/Gait Ambulation/Gait assistance: +2 safety/equipment, Min assist Gait Distance (Feet): 75 Feet (93ft, 31ft x2, 60ft with seated breaks between) Assistive device: Rolling walker (2 wheels) Gait Pattern/deviations: Step-through pattern, Decreased stride length, Narrow base of support, Drifts right/left, Trunk flexed General Gait Details: Pt with narrow BOS, fair use of RW, close chair follow for safety. Pt anxious re: gait belt but understanding need for it and for seated breaks today. Gait velocity: decreased Gait velocity interpretation: <1.8 ft/sec, indicate of risk for recurrent  falls Stairs: Yes Stairs assistance: Min assist, +2 safety/equipment Stair Management: Two rails, Step to pattern, Forwards Number of Stairs: 4 (2+2) General stair comments: cues for safety/sequencing, no buckling but pt more dyspneic after needed seated break next to stairs    ADL: ADL Overall ADL's : Needs assistance/impaired Eating/Feeding: Set up, Sitting Grooming: Minimal assistance, Sitting Upper Body Bathing: Minimal assistance, Sitting Lower Body Bathing: Maximal assistance, Cueing for safety, Sitting/lateral leans Upper Body Dressing : Minimal assistance, Sitting Lower Body Dressing: Maximal assistance, Sitting/lateral leans, Sit to/from stand Toilet Transfer: Minimal assistance, +2 for safety/equipment, Ambulation Toilet Transfer Details (indicate cue type and reason): min A for verbal cues and LOB Toileting-  Clothing Manipulation and Hygiene: Maximal assistance, Sitting/lateral lean, Sit to/from stand, Cueing for safety, Cueing for sequencing Functional mobility during ADLs: Minimal assistance, Moderate assistance, +2 for physical assistance, +2 for safety/equipment General ADL Comments: Session focused on incrased OOB activity tolerance. Pt tolerated ~5 minutes of amblation without AD, but did have 2 LOB that required mod A +2 to correct.  Cognition: Cognition Overall Cognitive Status: Impaired/Different from baseline Arousal/Alertness: Awake/alert Orientation Level: Oriented X4 Attention: Sustained Sustained Attention: Impaired Sustained Attention Impairment: Verbal basic Memory: Impaired Memory Impairment: Storage deficit Awareness: Appears intact Executive Function: Decision Making Decision Making: Impaired Cognition Arousal/Alertness: Awake/alert Behavior During Therapy: WFL for tasks assessed/performed, Impulsive Overall Cognitive Status: Impaired/Different from baseline Area of Impairment: Following commands, Safety/judgement, Awareness, Problem solving Current Attention Level: Sustained Memory: Decreased recall of precautions, Decreased short-term memory Following Commands: Follows one step commands consistently Safety/Judgement: Decreased awareness of safety, Decreased awareness of deficits Awareness: Emergent Problem Solving: Difficulty sequencing, Requires verbal cues General Comments: Decreased awareness of deficits but more agreeable to sit and rest today when dyspneic. SpO2 WFL on 4L O2 [place] with exertion, but DOE 3/4   Blood pressure (!) 123/93, pulse (!) 118, temperature 97.6 F (36.4 C), temperature source Axillary, resp. rate 14, height 5\' 9"  (1.753 m), weight 91.1 kg, SpO2 96 %. Physical Exam Vitals and nursing note reviewed.  Constitutional:      Appearance: Normal appearance. He is ill-appearing.  HENT:     Head: Normocephalic and atraumatic.     Right Ear:  External ear normal.     Left Ear: External ear normal.     Nose: Nose normal.     Mouth/Throat:     Mouth: Mucous membranes are moist.  Eyes:     Extraocular Movements: Extraocular movements intact.     Pupils: Pupils are equal, round, and reactive to light.  Cardiovascular:     Rate and Rhythm: Tachycardia present.     Heart sounds: No murmur heard.   No gallop.  Pulmonary:     Effort: Pulmonary effort is normal. No respiratory distress.     Breath sounds: Normal breath sounds. No wheezing.  Abdominal:     General: There is no distension.     Palpations: Abdomen is soft.     Tenderness: There is no abdominal tenderness.  Musculoskeletal:     Cervical back: Normal range of motion.  Skin:    General: Skin is warm.     Comments: Left elbow with resolving erythema/edema with flaky skin. Right elbow red with few scabbed areas. Mild drainage from aspiration site and another just above. BLE with 2+ pedal edema and dressed bilateral achilles wounds, from knees down to mid foot.   Neurological:     Mental Status: He is alert and oriented to person, place, and time.  Comments: Pt with reasonable insight and awareness. Some delays in processing and with conversation. Functional memory. Complained of mild diplopia when tracking my finger. UE. Strength 4-/5 UE's prox to 4/5 distally. LE: 3+ prox to 4- distally. DTR's 1+. Decreased [place] but without ataxia.   Psychiatric:        Mood and Affect: Mood normal.        Behavior: Behavior normal.    Results for orders placed or performed during the hospital encounter of 11/27/21 (from the past 48 hour(s))  Surgical PCR screen     Status: Abnormal   Collection Time: 12/02/21  8:50 PM   Specimen: Nasal Mucosa; Nasal Swab  Result Value Ref Range   MRSA, PCR NEGATIVE NEGATIVE   Staphylococcus aureus POSITIVE (A) NEGATIVE    Comment: (NOTE) The Xpert SA Assay (FDA approved for NASAL specimens in patients 26 years of age and older), is one  component of a comprehensive surveillance program. It is not intended to diagnose infection nor to guide or monitor treatment. Performed at [facility], [place] [address] [place], [place] [address]   CBC     Status: Abnormal   Collection Time: 12/03/21  3:31 AM  Result Value Ref Range   WBC 40.5 (H) 4.0 - 10.5 K/uL   RBC 3.25 (L) 4.22 - 5.81 MIL/uL   Hemoglobin 10.2 (L) 13.0 - 17.0 g/dL   HCT 29.5 (L) 39.0 - 52.0 %   MCV 90.8 80.0 - 100.0 fL   MCH 31.4 26.0 - 34.0 pg   MCHC 34.6 30.0 - 36.0 g/dL   RDW 15.6 (H) 11.5 - 15.5 %   Platelets 90 (L) 150 - 400 K/uL    Comment: Immature Platelet Fraction may be clinically indicated, consider ordering this additional test [id number] CONSISTENT WITH PREVIOUS RESULT REPEATED TO VERIFY    nRBC 0.1 0.0 - 0.2 %    Comment: Performed at [facility], [place] [address] [place], [place] [address]  Heparin level (unfractionated)     Status: None   Collection Time: 12/03/21  3:31 AM  Result Value Ref Range   Heparin Unfractionated 0.37 0.30 - 0.70 IU/mL    Comment: (NOTE) The clinical reportable range upper limit is being lowered to >1.10 to align with the FDA approved guidance for the current laboratory assay.  If heparin results are below expected values, and patient dosage has  been confirmed, suggest follow up testing of antithrombin III levels. Performed at [facility], [place] [address], [place], [place] [address]   Basic metabolic panel     Status: Abnormal   Collection Time: 12/03/21  3:31 AM  Result Value Ref Range   Sodium 126 (L) 135 - 145 mmol/L   Potassium 3.7 3.5 - 5.1 mmol/L   Chloride 90 (L) 98 - 111 mmol/L   CO2 24 22 - 32 mmol/L   Glucose, Bld 93 70 - 99 mg/dL    Comment: Glucose reference range applies only to samples taken after fasting for at least 8 hours.   BUN 12 6 - 20 mg/dL   Creatinine, Ser 0.78 0.61 - 1.24 mg/dL   Calcium 8.0 (L) 8.9 - 10.3 mg/dL   GFR, Estimated >60 >60 mL/min    Comment:  (NOTE) Calculated using the CKD-EPI Creatinine Equation (2021)    Anion gap 12 5 - 15    Comment: Performed at [facility] [address], [place], [place] [address]  CBC  Status: Abnormal   Collection Time: 12/04/21  2:34 AM  Result Value Ref Range   WBC 40.2 (H) 4.0 - 10.5 K/uL   RBC 3.37 (L) 4.22 - 5.81 MIL/uL   Hemoglobin 10.7 (L) 13.0 - 17.0 g/dL   HCT 30.3 (L) 39.0 - 52.0 %   MCV 89.9 80.0 - 100.0 fL   MCH 31.8 26.0 - 34.0 pg   MCHC 35.3 30.0 - 36.0 g/dL   RDW 15.9 (H) 11.5 - 15.5 %   Platelets 70 (L) 150 - 400 K/uL    Comment: Immature Platelet Fraction may be clinically indicated, consider ordering this additional test [id number] REPEATED TO VERIFY    nRBC 0.1 0.0 - 0.2 %    Comment: Performed at [facility], [place] [address] [place], [place] [address]  Basic metabolic panel     Status: Abnormal   Collection Time: 12/04/21  2:34 AM  Result Value Ref Range   Sodium 124 (L) 135 - 145 mmol/L   Potassium 3.5 3.5 - 5.1 mmol/L   Chloride 88 (L) 98 - 111 mmol/L   CO2 24 22 - 32 mmol/L   Glucose, Bld 102 (H) 70 - 99 mg/dL    Comment: Glucose reference range applies only to samples taken after fasting for at least 8 hours.   BUN 20 6 - 20 mg/dL   Creatinine, Ser 0.92 0.61 - 1.24 mg/dL   Calcium 7.9 (L) 8.9 - 10.3 mg/dL   GFR, Estimated >60 >60 mL/min    Comment: (NOTE) Calculated using the CKD-EPI Creatinine Equation (2021)    Anion gap 12 5 - 15    Comment: Performed at [facility] [address], [place], [place] [address]   No results found.     Medical Problem List and Plan: 1. 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  Antithrombotics: -DVT/anticoagulation:  Pharmaceutical:  Other (comment)--Eliquis  -antiplatelet therapy: N/A 3. Pain Management: Tylenol 4. Mood: pt struggling with diagnosis and end of life thoughts -would like for him to speak with neuropsychology while he's here -palliative care also would be appropriate.  -remeron trial (discussed with pt/wife)  -antipsychotic agents: N/A 5. Neuropsych: This patient is not fully capable of making decisions on his own behalf. 6. Skin/Wound Care:  Routine pressure relief measures  --Monitor elbow/BLE for healing. It seems to be improving    -add warm moist compress bid, otherwise wrap with kerlex  -might benefit from a darco-type shoe for gait given his heel wounds 7. Fluids/Electrolytes/Severe protein deficient malutrition: Monitor I/O. Check CMET in am.   -add protein supplement  -add remeron for mood/appetite as opposed to megace given his hypercoagulable state 8. Metastatic lung cancer: Now on oxygen--using 3-4 liters. Has difficulty lying flat due to dyspnea  --chemo/immunotherapy on hold  --Will be starting daily XRT starting Monday.  9. Urinary retention?:  Has been having difficulty voiding since July. CT showed thickened bladder  --will check UA/UCS to rule out cystitis  --monitor voiding with bladder scan/PVR checks.  10. Septic olecranon bursitis: On Duricef 1000 mg bid for 2-3 weeks --Will need follow up with ID on 11/28 for input to finalize course of treatment.  -local care 11. Leukocytosis: Felt to be steroid effect.  12. Hyponatremia: Na  continues to fluctuate from 120 at admission-->126-->124 13. Anemia/Thrombocytopenia: Felt to be due to chemo. Monitor for signs of bleeding.  --Recheck CBC in am.  15. Candida esophagitis: On diflucan. Will schedule MMW qid as this was effective at home.    Bary Leriche, PA-C 12/04/2021

## 2021-12-04 NOTE — Progress Notes (Signed)
Patient arrived to 5C-21 via wheelchair in stable condition. Denies any pain or discomfort at this time. Oriented to unit and fall prevention policy. Patient and family states understanding. Currently resting on recliner with all needs within reach. Family member present.   Gladstone Lighter, LPN

## 2021-12-04 NOTE — TOC Transition Note (Signed)
Transition of Care [facility]) - CM/SW Discharge Note   Patient Details  Name: Christopher Randall MRN: [id number] Date of Birth: 23-Dec-1960  [facility] CM/SW Contact:  Pollie Friar, RN Phone Number: 12/04/2021, 11:48 AM   Clinical Narrative:    Patient is discharging to CIR today. CM signing off.    Final next level of care: IP Rehab Facility Barriers to Discharge: No Barriers Identified   Patient Goals and CMS Choice        Discharge Placement                       Discharge Plan and Services                                     Social Determinants of Health (SDOH) Interventions     Readmission Risk Interventions No flowsheet data found.

## 2021-12-04 NOTE — Progress Notes (Signed)
Patient going to [facility] for radiation treatment via [facility], handoff report given to receiving RN, Althia Forts and [facility] transport. Medications will be held until pt returns to [facility] 3W.

## 2021-12-04 NOTE — PMR Pre-admission (Signed)
PMR Admission Coordinator Pre-Admission Assessment  Patient: Christopher Randall is an 60 y.o., male MRN: [id number] DOB: [birth year] Height: _0  (175.3 cm) Weight: 91.1 kg  Insurance Information HMO:     PPO:      PCP:      IPA:      80/20:      OTHER:  PRIMARY: [employer] [place]      Policy#: [id number]      Subscriber: pt CM Name: Amy      Phone#: [phone]     Fax#: [phone] Pre-Cert#: [id number] approved until 12/28      Employer:  Benefits:  Phone #: [phone]     Name: 12/13 Eff. Date: 09/19/2021     Deduct: $1250      Out of Pocket Max: $4890      Life Max: none CIR: 80%      SNF: 80% 100 days Outpatient: $10 to $52 per visit     Co-Pay: visits per medical neccesity Home Health: 80%      Co-Pay: visits per medical neccesity DME: 80%     Co-Pay: 20% Providers: in network  SECONDARY: none  Financial Counselor:       Phone#:   [facility] [employer] for patients in Inpatient Rehabilitation Facilities with attached Privacy Act [place] Records was provided and verbally reviewed with: N/A  Emergency Contact Information Contact Information     Name Relation Home Work Mobile   [place] Spouse   [phone]      Current Medical History  Patient Admitting Diagnosis: CVA  History of Present Illness: 60 year old male with medical history depression, recent dx of 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 Presented on 11/27/2021 after being seen by oncology with concerns for LE cellulitis.   MRI brain with bilateral anterior and posterior punctuate/small infarcts consistent with embolic shower secondary to hypercoagulable state. MRA distal R VA occlusion, right ACA stenosis. 2 D echo EF 55 % no thrombus or PFO. LE venous doppler bilateral distal DVT. LDL 119, Hgb A1c 5.8. Planned heparin IV transition to Eliquis per discussions with  oncologist, Dr Irene Limbo. Thrombocytopenia felt likely due to chemotherapy. On Plavix per cardiology as long as platelets stable >50.    EKG on admission showed ST elevation and cardiology consulted. Deemed not a candidate for invasive angiography due to thrombocytopenia as well as advanced malignancy. Continue metoprolol and statin. 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  Left elbow septic bursitis. Ortho consulted. Fluid aspirate from left elbow on 12/12 with preliminary cultures growing gram positive cocci. ID consulted. Was planned for washout but cancelled due to improvement with antibiotics . ID recommends 2 to 3 weeks of antibiotic given immunosuppression. On cefadroxil daily.   CT scan of chest showed interstitial and ground glass opacities in the upper greater than lower lobes consistent with edema. Pneumonia combination or lymphangitic carcinomatosis. Kept on broad spectrum antibiotics for a week.   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 Case discussed with oncology and radiation. On 12/16 got simulation for radiation treatment. To receive 10 doses/fractions of radiation daily per Dr Lanell Persons and follow up with Dr Irene Limbo after rehab for potential additional chemotherapy.   Complete NIHSS TOTAL: 0  Patient's medical record [facility] has been reviewed by the rehabilitation admission coordinator and physician.  Past Medical History  Past Medical  History:  Diagnosis Date   Abnormal TSH 09/28/2017   Anemia 11/27/2021   Anxiety 07/03/2012   Depression    Difficulty sleeping 09/28/2017   Examination of participant in clinical trial 09/11/2012   History of immunotherapy 02/23/2016   Melanoma of skin 10/26/2011   Nicotine dependence 07/17/2015   Smoker    STEMI (ST elevation myocardial infarction) ([place]) 11/27/2021   Has the patient had major surgery during 100 days prior to admission? No  Family History   family history includes CAD in his mother;  Hypertension in his mother; Lung cancer in his father.  Current Medications  Current Facility-Administered Medications:    0.9 %  sodium chloride infusion (Manually program via Guardrails IV Fluids), , Intravenous, Once, Deland Pretty, MD   0.9 %  sodium chloride infusion, , Intravenous, PRN, Orma Flaming, MD, Last Rate: 10 mL/hr at 12/01/21 2334, Infusion Verify at 12/01/21 2334   acetaminophen (TYLENOL) tablet 650 mg, 650 mg, Oral, Q6H PRN **OR** acetaminophen (TYLENOL) suppository 650 mg, 650 mg, Rectal, Q6H PRN, Orma Flaming, MD   apixaban Arne Cleveland) tablet 5 mg, 5 mg, Oral, BID, Dahal, Binaya, MD, 5 mg at 12/04/21 1127   atorvastatin (LIPITOR) tablet 40 mg, 40 mg, Oral, Daily, Rosalin Hawking, MD, 40 mg at 12/04/21 1127   cefadroxil (DURICEF) capsule 1,000 mg, 1,000 mg, Oral, BID, Mignon Pine, DO, 1,000 mg at 12/04/21 1126   Chlorhexidine Gluconate Cloth 2 % PADS 6 each, 6 each, Topical, Daily, Orma Flaming, MD, 6 each at 12/02/21 0814   clonazePAM (KLONOPIN) tablet 0.5 mg, 0.5 mg, Oral, BID PRN, Orma Flaming, MD, 0.5 mg at 12/03/21 2218   [START ON 12/05/2021] clopidogrel (PLAVIX) tablet 75 mg, 75 mg, Oral, Daily, Dahal, Binaya, MD   dexamethasone (DECADRON) tablet 1 mg, 1 mg, Oral, Q breakfast, Orma Flaming, MD, 1 mg at 12/04/21 1152   fluconazole (DIFLUCAN) tablet 100 mg, 100 mg, Oral, Daily, Orma Flaming, MD, 100 mg at 55/73/22 0254   folic acid (FOLVITE) tablet 1 mg, 1 mg, Oral, Daily, Orma Flaming, MD, 1 mg at 12/04/21 1126   furosemide (LASIX) tablet 40 mg, 40 mg, Oral, Daily, Dahal, Binaya, MD, 40 mg at 12/04/21 1126   guaiFENesin (MUCINEX) 12 hr tablet 1,200 mg, 1,200 mg, Oral, BID, Dahal, Binaya, MD, 1,200 mg at 12/04/21 1126   HYDROcodone-acetaminophen (NORCO/VICODIN) 5-325 MG per tablet 1-2 tablet, 1-2 tablet, Oral, Q4H PRN, Orma Flaming, MD, 2 tablet at 12/03/21 1823   ipratropium-albuterol (DUONEB) 0.5-2.5 (3) MG/3ML nebulizer solution 3 mL, 3 mL,  Nebulization, Q6H PRN, Dahal, Marlowe Aschoff, MD, 3 mL at 12/03/21 1838   magic mouthwash w/lidocaine, 5 mL, Oral, TID PRN, Orma Flaming, MD, 5 mL at 11/30/21 1051   megestrol (MEGACE) tablet 160 mg, 160 mg, Oral, Daily, Jacky Kindle, MD, 160 mg at 12/04/21 1133   metoprolol tartrate (LOPRESSOR) tablet 12.5 mg, 12.5 mg, Oral, BID, O'Neal, Cassie Freer, MD, 12.5 mg at 12/04/21 1126   mupirocin ointment (BACTROBAN) 2 % 1 application, 1 application, Nasal, BID, Dahal, Binaya, MD, 1 application at 27/06/23 1132   nicotine (NICODERM CQ - dosed in mg/24 hr) patch 7 mg, 7 mg, Transdermal, Daily, Byrum, Rose Fillers, MD, 7 mg at 12/04/21 1125   ondansetron (ZOFRAN) tablet 4 mg, 4 mg, Oral, Q6H PRN **OR** ondansetron (ZOFRAN) injection 4 mg, 4 mg, Intravenous, Q6H PRN, Orma Flaming, MD, 4 mg at 12/01/21 0847   pantoprazole (PROTONIX) EC tablet 40 mg, 40 mg, Oral, Daily, Orma Flaming, MD, 40 mg  at 12/04/21 1126   polyethylene glycol (MIRALAX / GLYCOLAX) packet 17 g, 17 g, Oral, Daily PRN, Dahal, Binaya, MD   polyvinyl alcohol (LIQUIFILM TEARS) 1.4 % ophthalmic solution 1 drop, 1 drop, Both Eyes, QID PRN, Orma Flaming, MD   silver sulfADIAZINE (SILVADENE) 1 % cream, , Topical, BID, Byrum, Rose Fillers, MD, Given at 12/03/21 2210   vitamin B-12 (CYANOCOBALAMIN) tablet 1,000 mcg, 1,000 mcg, Oral, Daily, Orma Flaming, MD, 1,000 mcg at 12/04/21 1126  Patients Current Diet:  Diet Order             Diet general           Diet Heart Room service appropriate? Yes; Fluid consistency: Thin  Diet effective now                  Precautions / Restrictions Precautions Precautions: Fall Precaution Comments: L elbow septic bursitis, B heel wounds, monitor SpO2 Restrictions Weight Bearing Restrictions: No Other Position/Activity Restrictions: SBP <180, B DVT   Has the patient had 2 or more falls or a fall with injury in the past year? No  Prior Activity Level Community (5-7x/wk): Independent, very active, just  retired September  Prior Functional Level Self Care: Did the patient need help bathing, dressing, using the toilet or eating? Independent  Indoor Mobility: Did the patient need assistance with walking from room to room (with or without device)? Independent  Stairs: Did the patient need assistance with internal or external stairs (with or without device)? Independent  Functional Cognition: Did the patient need help planning regular tasks such as shopping or remembering to take medications? Independent  Patient Information Are you of Hispanic, Latino/a,or Spanish origin?: A. No, not of Hispanic, Latino/a, or Spanish origin What is your race?: A. White Do you need or want an interpreter to communicate with a doctor or health care staff?: 0. No  Patient's Response To:  Health Literacy and Transportation Is the patient able to respond to health literacy and transportation needs?: Yes Health Literacy - How often do you need to have someone help you when you read instructions, pamphlets, or other written material from your doctor or pharmacy?: Never In the past 12 months, has lack of transportation kept you from medical appointments or from getting medications?: No In the past 12 months, has lack of transportation kept you from meetings, work, or from getting things needed for daily living?: No  Development worker, international aid / Equipment Home Equipment: Cane - quad, Shower seat  Prior Device Use: Indicate devices/aids used by the patient prior to current illness, exacerbation or injury? None of the above  Current Functional Level Cognition  Arousal/Alertness: Awake/alert Overall Cognitive Status: Impaired/Different from baseline Current Attention Level: Sustained Orientation Level: Oriented X4 Following Commands: Follows one step commands consistently Safety/Judgement: Decreased awareness of safety, Decreased awareness of deficits General Comments: Decreased awareness of deficits but more  agreeable to sit and rest today when dyspneic. SpO2 WFL on 4L O2 [place] with exertion, but DOE 3/4 Attention: Sustained Sustained Attention: Impaired Sustained Attention Impairment: Verbal basic Memory: Impaired Memory Impairment: Storage deficit Awareness: Appears intact Executive Function: Decision Making Decision Making: Impaired    Extremity Assessment (includes Sensation/Coordination)  Upper Extremity Assessment: LUE deficits/detail LUE Deficits / Details: L elbow is swollen, red and painful. ROM is WFL. Encourgaged stretching and ROM.  Lower Extremity Assessment: Defer to PT evaluation LLE Deficits / Details: weeping, discoloration, swelling ankles and feet L>R LLE Sensation:  (hypersensitive) LLE Coordination: decreased gross motor  ADLs  Overall ADL's : Needs assistance/impaired Eating/Feeding: Set up, Sitting Grooming: Minimal assistance, Sitting Upper Body Bathing: Minimal assistance, Sitting Lower Body Bathing: Maximal assistance, Cueing for safety, Sitting/lateral leans Upper Body Dressing : Minimal assistance, Sitting Lower Body Dressing: Maximal assistance, Sitting/lateral leans, Sit to/from stand Toilet Transfer: Minimal assistance, +2 for safety/equipment, Ambulation Toilet Transfer Details (indicate cue type and reason): min A for verbal cues and LOB Toileting- Clothing Manipulation and Hygiene: Maximal assistance, Sitting/lateral lean, Sit to/from stand, Cueing for safety, Cueing for sequencing Functional mobility during ADLs: Minimal assistance, Moderate assistance, +2 for physical assistance, +2 for safety/equipment General ADL Comments: Session focused on incrased OOB activity tolerance. Pt tolerated ~5 minutes of amblation without AD, but did have 2 LOB that required mod A +2 to correct.    Mobility  Overal bed mobility: Needs Assistance Bed Mobility: Supine to Sit Supine to sit: Supervision General bed mobility comments: received in chair    Transfers   Overall transfer level: Needs assistance Equipment used: Rolling walker (2 wheels) Transfers: Sit to/from Stand Sit to Stand: Min guard General transfer comment: from chair x5 reps total    Ambulation / Gait / Stairs / Wheelchair Mobility  Ambulation/Gait Ambulation/Gait assistance: +2 safety/equipment, Min assist Gait Distance (Feet): 75 Feet (67f, 76fx2, 5073fith seated breaks between) Assistive device: Rolling walker (2 wheels) Gait Pattern/deviations: Step-through pattern, Decreased stride length, Narrow base of support, Drifts right/left, Trunk flexed General Gait Details: Pt with narrow BOS, fair use of RW, close chair follow for safety. Pt anxious re: gait belt but understanding need for it and for seated breaks today. Gait velocity: decreased Gait velocity interpretation: <1.8 ft/sec, indicate of risk for recurrent falls Stairs: Yes Stairs assistance: Min assist, +2 safety/equipment Stair Management: Two rails, Step to pattern, Forwards Number of Stairs: 4 (2+2) General stair comments: cues for safety/sequencing, no buckling but pt more dyspneic after needed seated break next to stairs    Posture / Balance Balance Overall balance assessment: Needs assistance Sitting-balance support: Feet unsupported, No upper extremity supported Sitting balance-Leahy Scale: Good Standing balance support: No upper extremity supported, During functional activity Standing balance-Leahy Scale: Fair Standing balance comment: Fair to good with RW support; needs external support when unsupported    Special needs/care consideration Daily Radiation at [facility] as follow: 12/19     1245 12/20     1235 12/21     1215 12/22     1200   12/23     1215 12/26     no radiation 12/27     215 12/28     245 12/29     115 12/30     115 1/1          no radiation 1/3          130 last day of radiation for 10 total KatEsmond Camper at Radiation can be reached at  [phone] lead Radiation for scheduling issues can be reached at [email or web address] contacted 12/16 at 1300 to arrange transport daily for pickup 30 minutes prior to scheduled appointment daily    Previous Home Environment  Living Arrangements: Spouse/significant other  Lives With: Spouse Available Help at Discharge:  (wife to take FMLA) Type of Home: House Home Layout: Two level, Able to live on main level with bedroom/bathroom Home Access: Level entry Bathroom Shower/Tub: Wal[employer] programmerandicapped height Bathroom Accessibility: Yes [place] Additional Comments: wife has taken [employer]  to be with him. Children and other family live nearby as well  Discharge Living Setting Plans for Discharge Living Setting: Patient's home, Lives with (comment) (wife) Type of Home at Discharge: House Discharge Home Layout: Two level, Able to live on main level with bedroom/bathroom Discharge Home Access: Level entry Discharge Bathroom Shower/Tub: Walk-in shower Discharge Bathroom Toilet: Handicapped height Discharge Bathroom Accessibility: Yes How Accessible: Accessible via walker Does the patient have any problems obtaining your medications?: No  Social/Family/Support Systems Patient Roles: Spouse, Parent Contact Information: wife, Sharyn Lull Anticipated Caregiver: wife and family Anticipated Caregiver's Contact Information: [phone] Ability/Limitations of Caregiver: to take FMLA Caregiver Availability: 24/7 Discharge Plan Discussed with Primary Caregiver: Yes Is Caregiver In Agreement with Plan?: Yes Does Caregiver/Family have Issues with Lodging/Transportation while Pt is in Rehab?: No  Goals Patient/Family Goal for Rehab: supervision to min PT and OT, supervision with SLP Expected length of stay: ELOS 10 to 12 days Pt/Family Agrees to Admission and willing to participate: Yes Program Orientation Provided & Reviewed with Pt/Caregiver Including Roles   & Responsibilities: Yes  Decrease burden of Care through IP rehab admission: n/a  Possible need for SNF placement upon discharge: not anticipated  Patient Condition: I have reviewed medical records from [facility], spoken with CM, and patient, spouse, and son. I met with patient at the bedside for inpatient rehabilitation assessment.  Patient will benefit from ongoing PT, OT, and SLP, can actively participate in 3 hours of therapy a day 5 days of the week, and can make measurable gains during the admission.  Patient will also benefit from the coordinated team approach during an Inpatient Acute Rehabilitation admission.  The patient will receive intensive therapy as well as Rehabilitation physician, nursing, social worker, and care management interventions.  Due to bladder management, bowel management, safety, skin/wound care, disease management, medication administration, pain management, and patient education the patient requires 24 hour a day rehabilitation nursing.  The patient is currently min assist overall with mobility and basic ADLs.  Discharge setting and therapy post discharge at home with outpatient is anticipated.  Patient has agreed to participate in the Acute Inpatient Rehabilitation Program and will admit today.  Preadmission Screen Completed By:  Cleatrice Burke, 12/04/2021 1:11 PM ______________________________________________________________________   Discussed status with Dr. Naaman Plummer on 12/04/2021 at 1311 and received approval for admission today.  Admission Coordinator:  Cleatrice Burke, RN, time 1311 Date 12/04/2021   Assessment/Plan: Diagnosis: embolic CVA's Does the need for close, 24 hr/day Medical supervision in concert with the patient's rehab needs make it unreasonable for this patient to be served in a less intensive setting? Yes Co-Morbidities requiring supervision/potential complications: lung cancer with mets, left olecranon bursitis Due to  bladder management, bowel management, safety, skin/wound care, disease management, medication administration, pain management, and patient education, does the patient require 24 hr/day rehab nursing? Yes Does the patient require coordinated care of a physician, rehab nurse, PT, OT, and SLP to address physical and functional deficits in the context of the above medical diagnosis(es)? Yes Addressing deficits in the following areas: balance, endurance, locomotion, strength, transferring, bowel/bladder control, bathing, dressing, feeding, grooming, toileting, cognition, and psychosocial support Can the patient actively participate in an intensive therapy program of at least 3 hrs of therapy 5 days a week? Yes The potential for patient to make measurable gains while on inpatient rehab is excellent Anticipated functional outcomes upon discharge from inpatient rehab: supervision and min assist PT, supervision and min assist OT, supervision SLP Estimated rehab  length of stay to reach the above functional goals is: 10-12 days Anticipated discharge destination: Home 10. Overall Rehab/Functional Prognosis: excellent   MD Signature: [name], MD, Gem Director Rehabilitation Services 12/04/2021

## 2021-12-04 NOTE — Progress Notes (Signed)
Inpatient Rehabilitation Admissions Coordinator   I met at bedside with pt's wife and son. Patient at [facility] oncology . I will contact acute team and oncology to clarify radiation plans as well as future chemotherapy plans. We can accommodate daily radiation treatments and transport, but will also be scheduling 2 to 3 hrs per day of therapy per rehab protocol. We can not accommodate IV chemotherapy treatment in conjunction with other oncology and therapy plans. I have discussed with Dr Naaman Plummer with CIR. I await clarification of oncology team plans before proceeding with a possible Cir admit today.  Danne Baxter, RN, MSN Rehab Admissions Coordinator [phone] 12/04/2021 10:35 AM

## 2021-12-04 NOTE — Progress Notes (Signed)
Wound care provided to bilateral lower legs per orders.

## 2021-12-04 NOTE — Progress Notes (Signed)
Radiation Oncology         [phone] ________________________________  Follow-up New Visit   Inpatient   Name: Christopher Randall MRN: [id number]  Date: 12/04/2021  DOB: May 22, 1961  CC:Allwardt, Randa Evens, PA-C  Arrien, Jimmy Picket*   REFERRING PHYSICIAN: Tawni Millers*  DIAGNOSIS:     ICD-10-CM   1. Brain metastasis ([place])  C79.31     2. Mass of middle lobe of right lung  R91.8     3. 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 To review from his consultation, pathology from bronchoscopy was pending at the time. We discussed that if he were to have small cell lung cancer, whole brain radiation therapy would be indicated. For other types of cancer, stereotactic radiosurgery would be in his best interest. Ultimately, I predicted that he would be an excellent candidate for stereotactic radiosurgery unless his histology reveals small cell lung cancer.   Since then, biopsy of the RML mass performed on 10/09/21 revealed poorly differentiated non-small cell carcinoma (RML brushing and lavage also revealed malignant cells consistent with non-small cell carcinoma). Fine needle aspiration of the 10R, sub carinal, and 4R lymph nodes revealed malignant cells consistent with non-small cell carcinoma (in all 3 biopsies).   Neuronavigational MRI performed on 10/15/2021 revealed a slight interval decrease in size of 3 enhancing intracranial lesions, with associated decreased surrounding edema. No new lesions were identified.  On 10/20/21, the patient followed up with Dr. Irene Limbo. During which time, Dr. Irene Limbo ordered molecular testing on lung cancer tissue  samples to determine the optical systemic treatment plan after completing radiation. The patient was also noted to be tolerating steroids/steroid taper well.  Accordingly, the patient was simulated and underwent SRS treatment on 10/23/21.  Guardant 360 testing collected from biopsies on 10/09/21 did not show any targetable mutations. His MSI high biomarker was not detected.  The patient followed up with Dr. Irene Limbo on 11/05/21 via tele-health visit. During which time, the patient reported a mild headache and some difficulty sleeping due to steroids, and reported no acute new focal neurological deficits. The patient met with Dr. Irene Limbo via video visit because he tested positive for both COVID-19 and influenza. Patient reported significant respiratory symptoms including cough productive of colored phlegm. He was treated by his primary care PA-C Alyssa Allwardt with Tamiflu and Molnupiravir with improvement in his respiratory symptoms and fever. Given the patient's guardant 360 results. Dr. Irene Limbo recommended proceeding with platinum doublet chemotherapy with  carboplatin/Alimta/pembrolizumab with growth factor support, after his viral infection's resolve.   The patient then followed up with me on 11/17/21 due to onset of new symptoms including progressive swelling of the bilateral lower extremities with weeping of the skin, acute epistaxis, and mouth sores (despite a recent regimen of fluconazole). Patient also reported generalized achiness to the right side/lateral/posterior side of his head, bouts of nausea after eating, and discomfort with ambulation/movement due to bilateral LE edema. In regards to his LE edema, we expedited his steroid taper out of suspicion that it may be contributing to his edema. Patient was instructed to take half a tablet daily of his dexamethasone on 11/30, and  then half a tablet every other day starting on 12/03.   The patient received his first infusion on 11/20/21.   During follow  up with Dr. Mauri Reading on 11/24/21, the patient reported issues with attention, memory, and language expression over past 6 weeks since brain metastases were first diagnosed.  Patient also reported feeling very tired and fatigued' since chemo was administered. Neuro exam performed revealed age advanced psychomotor slowing, features of agnosia,  and poor insight into clinical limitations. Given his presenting symptoms, MRI was recommended ASAP and his decadron was reduced to 1 mg daily.  MRI of the brain on 11/27/21 revealed numerous small foci of acute to subacute infarcts involving multiple vascular territories, suggestive of a central embolic source. Otherwise, imaging portrayed a positive treatment response.   On 11/27/21, the patient presented to the ED after being seen by oncology with concerns for cellulitis. The patient presented to the cancer center that day due to LE edema. Per the patient's wife, the patient developed weeping from his legs 3 days prior, which produced "cups of liquid". The night prior to ED presentation, the fluid started to appear yellow and bloody. The patient also reported pain over the past 2 weeks over his left lower lung, shortness of breath over the last 2 days, and cough over the last 2-3 days which sounded wet. Physical exam noted sanguineous weeping, peeling of skin, and purpura to bilateral feet/ankles, with petechiae all over (reference image attached to ED H&P). Per findings on MRI dated 11/27/21, the patient was evaluated for embolic stroke and admitted for progressive work-up and other acute issues. The patient remains admitted at this time.   Pertinent imaging performed during the patient's current admission includes the following:  --CXR on 11/27/21 showed bilateral airspace disease with small right effusion, concerning for multifocal pneumonia. --MRA of the head without contrast on 11/28/21 revealed moderate stenosis of right anterior cerebral artery. Left anterior  cerebral artery was appreciated to be widely patent, as well as both middle cerebral arteries.  Loss of flow related signal was also noted in respects to the distal right vertebral artery (possibly due to occlusion). --CT of the left elbow (due to suspicion of soft tissue infection) on 11/28/21 revealed a fluid collection overlying the olecranon measuring approximately 3.3 x 1.2 x 3.0 cm, with peripheral enhancement noted to possibly represent as abscess or bursitis. Prominent soft tissue edema and ill-defined fluid at the elbow were also seen, proximal to the mid forearm, suggestive of cellulitis. --CXR prompted due to SOB on 11/30/21 revealed increased central pulmonary vascular congestion and increased bilateral lung opacities concerning for edema. Increased right middle lobe opacity also seen was noted as concerning for pneumonia, atelectasis, or possibly fluid within the right minor fissure. --Chest CT on 12/02/21 demonstrated worsening of extensive mediastinal and hilar adenopathy, as well as enlarging left supraclavicular and abdominal retroperitoneal nodes. An enlarging right upper lobe mass was also appreciated, noted to appear to collapse the right middle lobe and efface the proximal bronchi. Additionally, cardiomegaly with increased pericardial effusion were also appreciated, as well as increased venous distension, moderate right and small left pleural effusions, interstitial edema in the lung apices and bases, and interstitial and ground-glass opacities in the upper greater than lower lobes consistent with edema, pneumonia or combination.   PREVIOUS RADIATION THERAPY: Yes, SRS treatment on 10/23/21. (Brain)  PAST MEDICAL HISTORY:  has a past medical history of Abnormal TSH (09/28/2017), Anemia (11/27/2021), Anxiety (07/03/2012), Depression, Difficulty sleeping (09/28/2017), Examination of participant in  clinical trial (09/11/2012), History of immunotherapy (02/23/2016), Melanoma of skin  (10/26/2011), Nicotine dependence (07/17/2015), Smoker, and STEMI (ST elevation myocardial infarction) ([place]) (11/27/2021).    PAST SURGICAL HISTORY: Past Surgical History:  Procedure Laterality Date   BRONCHIAL BIOPSY  10/09/2021   Procedure: BRONCHIAL BIOPSIES;  Surgeon: [name], MD;  Location: [facility];  Service: Endoscopy;;   BRONCHIAL BRUSHINGS  10/09/2021   Procedure: BRONCHIAL BRUSHINGS;  Surgeon: [name], MD;  Location: [facility];  Service: Endoscopy;;   ENDOBRONCHIAL ULTRASOUND N/A 10/09/2021   Procedure: ENDOBRONCHIAL ULTRASOUND;  Surgeon: [name], MD;  Location: [facility];  Service: Endoscopy;  Laterality: N/A;   FINE NEEDLE ASPIRATION  10/09/2021   Procedure: FINE NEEDLE ASPIRATION (FNA) LINEAR;  Surgeon: [name], MD;  Location: [facility];  Service: Endoscopy;;   LEFT POPLITEAL MASS RESECTION  08/27/2011   pigmented villonodular synovitis, negative for malignancy   LYMPH GLAND EXCISION  07/12/2011   right axilla, consistent with melanoma   LYMPH NODE DISSECTION  08/27/2011   right, 0/17 nodes positive   VIDEO BRONCHOSCOPY N/A 10/09/2021   Procedure: VIDEO BRONCHOSCOPY WITHOUT FLUORO;  Surgeon: [name], MD;  Location: [facility];  Service: Endoscopy;  Laterality: N/A;    FAMILY HISTORY: family history includes CAD in his mother; Hypertension in his mother; Lung cancer in his father.  SOCIAL HISTORY:  reports that he has quit smoking. His smoking use included cigarettes. He smoked an average of .25 packs per day. He has never used smokeless tobacco. He reports current alcohol use of about 6.0 standard drinks per week. He reports that he does not use drugs.  ALLERGIES: Iopamidol, Iodinated diagnostic agents, and Iodine-131  MEDICATIONS:  No current facility-administered medications for this encounter.   No current outpatient medications on file.   Facility-Administered Medications Ordered in Other Encounters   Medication Dose Route Frequency Provider Last Rate Last Admin   [START ON 12/05/2021] (feeding supplement) PROSource Plus liquid 30 mL  30 mL Oral BID BM Love, Pamela S, PA-C       acetaminophen (TYLENOL) tablet 325-650 mg  325-650 mg Oral Q4H PRN Love, Pamela S, PA-C       alum & mag hydroxide-simeth (MAALOX/MYLANTA) 200-200-20 MG/5ML suspension 30 mL  30 mL Oral Q4H PRN Love, Pamela S, PA-C       apixaban (ELIQUIS) tablet 5 mg  5 mg Oral BID Love, Pamela S, PA-C       [START ON 12/05/2021] atorvastatin (LIPITOR) tablet 40 mg  40 mg Oral Daily Love, Pamela S, PA-C       bisacodyl (DULCOLAX) suppository 10 mg  10 mg Rectal Daily PRN Love, Pamela S, PA-C       cefadroxil (DURICEF) capsule 1,000 mg  1,000 mg Oral BID Love, Pamela S, PA-C       [START ON 12/05/2021] Chlorhexidine Gluconate Cloth 2 % PADS 6 each  6 each Topical Daily Love, Pamela S, PA-C       clonazePAM (KLONOPIN) tablet 0.5 mg  0.5 mg Oral BID PRN Love, Pamela S, PA-C       [START ON 12/05/2021] clopidogrel (PLAVIX) tablet 75 mg  75 mg Oral Daily Love, Pamela S, PA-C       [START ON 12/05/2021] dexamethasone (DECADRON) tablet 1 mg  1 mg Oral Q breakfast Love, Pamela S, PA-C       diphenhydrAMINE (BENADRYL) 12.5 MG/5ML elixir 12.5-25 mg  12.5-25 mg Oral Q6H PRN Love, Ivan Anchors, PA-C       [  START ON 12/05/2021] fluconazole (DIFLUCAN) tablet 100 mg  100 mg Oral Daily Bary Leriche, PA-C       [START ON 49/70/2637] folic acid (FOLVITE) tablet 1 mg  1 mg Oral Daily Love, Pamela S, PA-C       [START ON 12/05/2021] furosemide (LASIX) tablet 40 mg  40 mg Oral Daily Love, Pamela S, PA-C       guaiFENesin (MUCINEX) 12 hr tablet 1,200 mg  1,200 mg Oral BID Love, Pamela S, PA-C       guaiFENesin-dextromethorphan (ROBITUSSIN DM) 100-10 MG/5ML syrup 5-10 mL  5-10 mL Oral Q6H PRN Love, Pamela S, PA-C       HYDROcodone-acetaminophen (NORCO/VICODIN) 5-325 MG per tablet 1-2 tablet  1-2 tablet Oral Q4H PRN Love, Pamela S, PA-C        ipratropium-albuterol (DUONEB) 0.5-2.5 (3) MG/3ML nebulizer solution 3 mL  3 mL Nebulization Q6H PRN Love, Pamela S, PA-C       lidocaine (XYLOCAINE) 2 % jelly   Topical PRN Love, Pamela S, PA-C       magic mouthwash w/lidocaine  5 mL Oral QID Love, Pamela S, PA-C       metoprolol tartrate (LOPRESSOR) tablet 12.5 mg  12.5 mg Oral BID Love, Pamela S, PA-C       mirtazapine (REMERON) tablet 15 mg  15 mg Oral QHS [name], MD       mupirocin ointment (BACTROBAN) 2 % 1 application  1 application Nasal BID Love, Pamela S, PA-C       [START ON 12/05/2021] nicotine (NICODERM CQ - dosed in mg/24 hr) patch 7 mg  7 mg Transdermal Daily Love, Pamela S, PA-C       [START ON 12/05/2021] pantoprazole (PROTONIX) EC tablet 40 mg  40 mg Oral Daily Love, Pamela S, PA-C       polyethylene glycol (MIRALAX / GLYCOLAX) packet 17 g  17 g Oral Daily PRN Love, Pamela S, PA-C       polyvinyl alcohol (LIQUIFILM TEARS) 1.4 % ophthalmic solution 1 drop  1 drop Both Eyes QID PRN Love, Pamela S, PA-C       prochlorperazine (COMPAZINE) tablet 5-10 mg  5-10 mg Oral Q6H PRN Love, Pamela S, PA-C       Or   prochlorperazine (COMPAZINE) injection 5-10 mg  5-10 mg Intramuscular Q6H PRN Love, Pamela S, PA-C       Or   prochlorperazine (COMPAZINE) suppository 12.5 mg  12.5 mg Rectal Q6H PRN Love, Pamela S, PA-C       silver sulfADIAZINE (SILVADENE) 1 % cream   Topical BID Love, Pamela S, PA-C       sodium phosphate (FLEET) 7-19 GM/118ML enema 1 enema  1 enema Rectal Once PRN Love, Pamela S, PA-C       traZODone (DESYREL) tablet 25-50 mg  25-50 mg Oral QHS PRN Love, Pamela S, PA-C       [START ON 12/05/2021] vitamin B-12 (CYANOCOBALAMIN) tablet 1,000 mcg  1,000 mcg Oral Daily Love, Pamela S, PA-C        REVIEW OF SYSTEMS:  As above.   PHYSICAL EXAM:  vitals were not taken for this visit.   General: Alert, in no acute distress  - breathing [place] O2 HEENT: Head is normocephalic.   Heart: Regular in rate and rhythm with no  murmurs, rubs, or gallops. Chest: Clear to auscultation bilaterally, with no rhonchi, wheezes, or rales. Neurologic: Cranial nerves II through XII are grossly intact.  Speech is fluent. Coordination is grossly intact. Psychiatric: Judgment and insight are intact. Affect is appropriate.  ECOG = 3  0 - Asymptomatic (Fully active, able to carry on all predisease activities without restriction)  1 - Symptomatic but completely ambulatory (Restricted in physically strenuous activity but ambulatory and able to carry out work of a light or sedentary nature. For example, light housework, office work)  2 - Symptomatic, <50% in bed during the day (Ambulatory and capable of all self care but unable to carry out any work activities. Up and about more than 50% of waking hours)  3 - Symptomatic, >50% in bed, but not bedbound (Capable of only limited self-care, confined to bed or chair 50% or more of waking hours)  4 - Bedbound (Completely disabled. Cannot carry on any self-care. Totally confined to bed or chair)  5 - Death   Christopher Randall MM, Creech RH, Tormey DC, et al. [phone]). "Toxicity and response criteria of the [facility]". [place] Oncol. 5 (6): 649-55  LABORATORY DATA:  Lab Results  Component Value Date   WBC 40.2 (H) 12/04/2021   HGB 10.7 (L) 12/04/2021   HCT 30.3 (L) 12/04/2021   MCV 89.9 12/04/2021   PLT 70 (L) 12/04/2021   CMP     Component Value Date/Time   NA 124 (L) 12/04/2021 0234   K 3.5 12/04/2021 0234   CL 88 (L) 12/04/2021 0234   CO2 24 12/04/2021 0234   GLUCOSE 102 (H) 12/04/2021 0234   BUN 20 12/04/2021 0234   CREATININE 0.92 12/04/2021 0234   CREATININE 0.82 11/27/2021 1142   CALCIUM 7.9 (L) 12/04/2021 0234   PROT 4.8 (L) 12/02/2021 0452   ALBUMIN 1.8 (L) 12/02/2021 0452   AST 45 (H) 12/02/2021 0452   AST 40 11/27/2021 1142   ALT 49 (H) 12/02/2021 0452   ALT 31 11/27/2021 1142   ALKPHOS 105 12/02/2021 0452   BILITOT 0.4 12/02/2021 0452    BILITOT 0.4 11/27/2021 1142   GFRNONAA >60 12/04/2021 0234   GFRNONAA >60 11/27/2021 1142         RADIOGRAPHY: DG Chest 2 View  Result Date: 11/27/2021 CLINICAL DATA:  Suspected sepsis EXAM: CHEST - 2 VIEW COMPARISON:  10/27/2021 FINDINGS: Heart is normal size. Bilateral airspace disease, most pronounced in the right lower lobe and left upper lobe. Small right pleural effusion. No acute bony abnormality. IMPRESSION: Bilateral airspace disease with small right effusion. Findings are concerning for multifocal pneumonia. Electronically Signed   By: Rolm Baptise M.D.   On: 11/27/2021 18:14   CT CHEST WO CONTRAST  Result Date: 12/02/2021 CLINICAL DATA:  Suspected enlarging right hilar mass with multifocal pneumonia/edema. Multifocal adenopathy on prior CT. EXAM: CT CHEST WITHOUT CONTRAST TECHNIQUE: Multidetector CT imaging of the chest was performed following the standard protocol without IV contrast. COMPARISON:  CT with IV contrast 10/07/2021 FINDINGS: Cardiovascular: Mild panchamber cardiomegaly is increased since the prior study is also circumferential pericardial effusion now 1.5 cm in thickness, previously no more than 6 mm. There is interval increased distention of the superior pulmonary veins and of the pulmonary trunk with the pulmonary trunk 3.5 cm indicating arterial hypertension. There is patchy calcification in the left main, LAD and circumflex coronary arteries, mild calcification in the aorta and branch vessels, stably ectatic aortic root and ascending segment both measuring 4.2 cm with the remainder of the aorta normal caliber. Mediastinum/Nodes: Adenopathy is difficult to evaluate given the lack of IV contrast but there is notable  worsening. An index left supraclavicular lymph node now measures 2.1 x 2.5 cm on series 3 axial 7, previously 2.2 x 2.2 cm. Axillary surgical clips are noted in the right but no axillary adenopathy. In the mediastinum, extensive adenopathy has worsened in the  interval with lobular adenopathy along both hila also increased. The hilar adenopathy is difficult to separate from adjacent vessels, but an index right paratracheal lymph node now measures 3.5 x 3.4 cm on axial 7, was previously 2.7 x 2.2 cm, with an index AP window lymph node now 3.1 x 2.4 cm previously 1.7 x 1.2 cm, left-sided precarinal nodal complex now 3.3 x 1.6 cm on axial 59 and previously 1.5 x 0.9 cm; and a conglomerate, centrally necrotic subcarinal mass is now 4.7 x 4.1 cm on axial 73 mm previously 4.3 x 3.2 cm. No mass is seen visualized thyroid gland. The tracheal air column is patent. No focal esophageal thickening. Lungs/Pleura: There is a perihilar mass in the base of the right upper lobe centered medially but extending to the periphery and as before, is difficult to separate from adjacent atelectasis but has also enlarged, now measuring estimated 8.5 x 5.4 cm, previously 5.6 x 4.0 cm, again completely collapsing the right middle lobe first order bronchi and likely also the right lower lobe interlobar pulmonary artery. There is interval new demonstration of a moderate right and small left layering pleural effusions with a small amount of the right pleural fluid loculated anteriorly. There is interlobular septal thickening in the lung bases and apices also increased. Paraseptal emphysematous changes in the apices are again noted. Most of the right lower lobe is collapsed by the right pleural effusion. Elsewhere there are interstitial and dense ground-glass opacities in a patchy distribution in the upper lobes with small amounts in the lower lobes, in the left lower lobe primarily in the periphery. I believe the right middle lobe is probably collapsed due to the mass. Upper Abdomen: There is increased adenopathy in the porta hepatis and gastrohepatic ligament as well as in the aortocaval space and portacaval space. Without contrast no mass in the visualized liver seen , no adrenal mass.  Musculoskeletal: No destructive osseous or lytic lesions are observed. IMPRESSION: 1. Worsening of extensive mediastinal and hilar adenopathy with enlarging left supraclavicular and abdominal retroperitoneal nodes. 2. Enlarging right upper lobe mass which now appears to collapse the right middle lobe and effaces its proximal bronchi. 3. Cardiomegaly with increased pericardial effusion, increased venous distension, moderate right and small left pleural effusions, and interstitial edema in the lung apices and bases. 4. Interstitial and ground-glass opacities in the upper greater than lower lobes consistent with edema, pneumonia or combination. Alternatively lymphangitic carcinomatosis. 5. Aortic and coronary artery atherosclerosis with 4.2 cm ectasia of the aortic root and ascending segment, and increased prominence of the pulmonary trunk. Electronically Signed   By: Telford Nab M.D.   On: 12/02/2021 03:44   MR ANGIO HEAD WO CONTRAST  Result Date: 11/28/2021 CLINICAL DATA:  Metastatic disease to brain.  Acute stroke. EXAM: MRA HEAD WITHOUT CONTRAST TECHNIQUE: Angiographic images of the Circle of Willis were acquired using MRA technique without intravenous contrast. COMPARISON:  MRI head 11/27/2021 FINDINGS: Anterior circulation: Internal carotid artery patent bilaterally. Moderate stenosis right A2 segment. Left anterior cerebral artery widely patent. Middle cerebral artery widely patent bilaterally. Posterior circulation: Left vertebral artery is widely patent supplies the basilar. Left PICA patent. Distal right vertebral artery not visualized and may be occluded. This appears to be  a small vessel on the MRI. Basilar widely patent. Posterior cerebral arteries patent without large vessel occlusion. Fetal origin right posterior cerebral artery. Anatomic variants: None Other: Negative for cerebral aneurysm. IMPRESSION: Moderate stenosis right anterior cerebral artery. Left anterior cerebral artery widely patent.  Both middle cerebral arteries patent Loss of flow related signal distal right vertebral artery. This may be occluded. Electronically Signed   By: Franchot Gallo M.D.   On: 11/28/2021 13:35   MR BRAIN W WO CONTRAST  Result Date: 11/27/2021 CLINICAL DATA:  Brain metastasis. Brain/central nervous system neoplasm; assess for treatment response. EXAM: MRI HEAD WITHOUT AND WITH CONTRAST TECHNIQUE: Multiplanar, multiecho pulse sequences of the brain and surrounding structures were obtained without and with intravenous contrast. CONTRAST:  44m GADAVIST GADOBUTROL 1 MMOL/ML IV SOLN COMPARISON:  MR head 10/15/2021. FINDINGS: Motion artifact is present. Brain: The largest, likely extra-axial lesion along the right falx has decreased in size measuring approximately 1.2 x 0.6 cm (previously 2.1 x 1.2 cm). Left parasagittal frontal lesion measures approximately 5 mm (series 17, image 102) is slightly smaller (previously 6 mm). The small left parietal lesion is not definitely seen. Extensive edema in the right frontal lobe has nearly resolved. Edema in the anterior left frontal lobe has resolved. No definite new mass or abnormal enhancement. There are numerous foci of diffusion hyperintensity the cerebral hemispheres involving all but the temporal lobes as well as the cerebellar hemispheres and thalami. No hydrocephalus or extra-axial collection. Vascular: Major vessel flow voids at the skull base are preserved. Skull and upper cervical spine: Normal marrow signal is preserved. Sinuses/Orbits: Paranasal sinuses are aerated. Orbits are unremarkable. Other: Sella is unremarkable.  Mastoid air cells are clear. IMPRESSION: Motion degraded study. Positive treatment response as detailed above. No definite new metastasis. Numerous small foci of acute to subacute infarcts involving multiple vascular territories suggestive of a central embolic source. These results will be called to the ordering clinician or representative by the  Radiologist Assistant, and communication documented in the PACS or [employer] Electronically Signed   By: PMacy MisM.D.   On: 11/27/2021 13:05   CT ELBOW LEFT W CONTRAST  Result Date: 11/28/2021 CLINICAL DATA:  Soft tissue infection suspected, upper arm, xray done EXAM: CT OF THE UPPER LEFT EXTREMITY WITH CONTRAST TECHNIQUE: Multidetector CT imaging of the upper left extremity was performed according to the standard protocol following intravenous contrast administration. CONTRAST:  1552mOMNIPAQUE IOHEXOL 300 MG/ML  SOLN COMPARISON:  None. FINDINGS: Technical note: Examination is limited by beam hardening artifact related to patient's positioning of the elbow adjacent to the abdominal wall. Elbow is in flexion resulting in nonstandard imaging planes. There is also slight motion degradation affecting all series. Bones/Joint/Cartilage No acute fracture. No dislocation. No significant elbow joint effusion is seen. No bony destruction or periosteal elevation. Ligaments Suboptimally assessed by CT. Muscles and Tendons Musculotendinous structures are poorly evaluated. No obvious intramuscular fluid collection. Soft tissues Fluid collection overlying the olecranon measuring approximately 3.3 x 1.2 x 3.0 cm with peripheral enhancement (series 8, image 119; series 11, image 71) may represent abscess or bursitis. There is prominent soft tissue edema and ill-defined fluid at the elbow and proximal to mid forearm. No soft tissue gas. IMPRESSION: 1. Limited exam. 2. Fluid collection overlying the olecranon measuring approximately 3.3 x 1.2 x 3.0 cm with peripheral enhancement may represent abscess or bursitis. 3. Prominent soft tissue edema and ill-defined fluid at the elbow and proximal to mid forearm suggestive of cellulitis. No  soft tissue gas. 4. No acute osseous abnormality. 5. No appreciable elbow joint effusion to suggest septic arthritis. Electronically Signed   By: Davina Poke D.O.   On:  11/28/2021 19:46   DG Chest Port 1 View  Result Date: 11/30/2021 CLINICAL DATA:  Shortness of breath. EXAM: PORTABLE CHEST 1 VIEW COMPARISON:  November 27, 2021. FINDINGS: Stable cardiomediastinal silhouette. Increased central pulmonary vascular congestion is noted as well as bilateral lung opacities concerning for edema. Increased right middle lobe opacity is noted concerning for pneumonia, atelectasis or possibly fluid within the right minor fissure. Bony thorax is unremarkable. IMPRESSION: Increased central pulmonary vascular congestion is noted with increased bilateral lung opacities concerning for edema. Increased right middle lobe opacity is noted concerning for pneumonia, atelectasis or possibly fluid within the right minor fissure. Electronically Signed   By: Marijo Conception M.D.   On: 11/30/2021 08:51   ECHOCARDIOGRAM COMPLETE BUBBLE STUDY  Result Date: 11/28/2021    ECHOCARDIOGRAM REPORT   Patient Name:   Christopher Randall Date of Exam: 11/28/2021 Medical Rec #:  [id number]         Height:       69.0 in Accession #:    [id number]        Weight:       203.7 lb Date of Birth:  1961/10/27         BSA:          2.082 m Patient Age:    80 years          BP:           137/84 mmHg Patient Gender: M                 HR:           114 bpm. Exam Location:  Inpatient Procedure: 2D Echo and Saline Contrast Bubble Study Indications:     stroke  History:         Patient has no prior history of Echocardiogram examinations.                  Lung cancer. sepsis.  Sonographer:     Johny Chess RDCS Referring Phys:  [id number] [place] Diagnosing Phys: Jenkins Rouge MD  Sonographer Comments: Image acquisition challenging due to respiratory motion. IMPRESSIONS  1. Patient to return for more images to assess RWMAls and apex with definity . Left ventricular ejection fraction, by estimation, is 55%. The left ventricle has normal function. The left ventricle has no regional wall motion abnormalities. Left  ventricular diastolic parameters were normal.  2. Right ventricular systolic function is normal. The right ventricular size is normal.  3. No evidence of tamponade . Small to moderate. The pericardial effusion is posterior to the left ventricle and anterior to the right ventricle. There is no evidence of cardiac tamponade.  4. The mitral valve is grossly normal. No evidence of mitral valve regurgitation. No evidence of mitral stenosis.  5. The aortic valve is tricuspid. Aortic valve regurgitation is not visualized. No aortic stenosis is present.  6. The inferior vena cava is normal in size with greater than 50% respiratory variability, suggesting right atrial pressure of 3 mmHg.  7. Agitated saline contrast bubble study was negative, with no evidence of any interatrial shunt. FINDINGS  Left Ventricle: Patient to return for more images to assess RWMAls and apex with definity. Left ventricular ejection fraction, by estimation, is 55%. The left ventricle has normal function. The left ventricle  has no regional wall motion abnormalities. The left ventricular internal cavity size was normal in size. There is no left ventricular hypertrophy. Left ventricular diastolic parameters were normal. Right Ventricle: The right ventricular size is normal. No increase in right ventricular wall thickness. Right ventricular systolic function is normal. Left Atrium: Left atrial size was normal in size. Right Atrium: Right atrial size was normal in size. Pericardium: No evidence of tamponade. Small to moderate. The pericardial effusion is posterior to the left ventricle and anterior to the right ventricle. There is no evidence of cardiac tamponade. Presence of epicardial fat layer. Mitral Valve: The mitral valve is grossly normal. No evidence of mitral valve regurgitation. No evidence of mitral valve stenosis. Tricuspid Valve: The tricuspid valve is grossly normal. Tricuspid valve regurgitation is mild . No evidence of tricuspid  stenosis. Aortic Valve: The aortic valve is tricuspid. Aortic valve regurgitation is not visualized. No aortic stenosis is present. Pulmonic Valve: The pulmonic valve was grossly normal. Pulmonic valve regurgitation is not visualized. No evidence of pulmonic stenosis. Aorta: The aortic root and ascending aorta are structurally normal, with no evidence of dilitation. Venous: The inferior vena cava is normal in size with greater than 50% respiratory variability, suggesting right atrial pressure of 3 mmHg. IAS/Shunts: The atrial septum is grossly normal. Agitated saline contrast was given intravenously to evaluate for intracardiac shunting. Agitated saline contrast bubble study was negative, with no evidence of any interatrial shunt.  LEFT VENTRICLE PLAX 2D LVIDd:         4.10 cm LVIDs:         2.60 cm LV PW:         1.10 cm LV IVS:        1.00 cm LVOT diam:     2.10 cm LVOT Area:     3.46 cm  RIGHT VENTRICLE         IVC TAPSE (M-mode): 2.7 cm  IVC diam: 2.00 cm LEFT ATRIUM             Index        RIGHT ATRIUM           Index LA diam:        3.40 cm 1.63 cm/m   RA Area:     16.90 cm LA Vol (A2C):   26.3 ml 12.63 ml/m  RA Volume:   39.50 ml  18.97 ml/m LA Vol (A4C):   32.4 ml 15.58 ml/m LA Biplane Vol: 29.2 ml 14.02 ml/m   AORTA Ao Root diam: 3.70 cm Ao Asc diam:  3.60 cm  SHUNTS Systemic Diam: 2.10 cm Jenkins Rouge MD Electronically signed by Jenkins Rouge MD Signature Date/Time: 11/28/2021/1:26:38 PM    Final (Updated)    VAS US CAROTID  Result Date: 11/30/2021 Carotid Arterial Duplex Study Patient Name:  Christopher Randall  Date of Exam:   11/28/2021 Medical Rec #: [id number]          Accession #:    [id number] Date of Birth: 17-May-1961          Patient Gender: M Patient Age:   68 years Exam Location:  [facility] Procedure:      VAS US CAROTID Referring Phys: Alferd Patee [facility] --------------------------------------------------------------------------------  Indications:       CVA. Risk Factors:       Past history of smoking, prior MI. Comparison Study:  No prior study Performing Technologist: Maudry Mayhew MHA, RDMS, RVT, RDCS  Examination Guidelines: A complete evaluation includes B-mode imaging,  spectral Doppler, color Doppler, and power Doppler as needed of all accessible portions of each vessel. Bilateral testing is considered an integral part of a complete examination. Limited examinations for reoccurring indications may be performed as noted.  Right Carotid Findings: +----------+--------+-------+--------+----------------------+------------------+             PSV cm/s EDV     Stenosis Plaque Description     Comments                                 cm/s                                                        +----------+--------+-------+--------+----------------------+------------------+  CCA Prox   103      24                                      intimal thickening  +----------+--------+-------+--------+----------------------+------------------+  CCA Distal 57       13                                      intimal thickening  +----------+--------+-------+--------+----------------------+------------------+  ICA Prox   37       17               smooth and                                                                       heterogenous                               +----------+--------+-------+--------+----------------------+------------------+  ICA Distal 66       30                                                          +----------+--------+-------+--------+----------------------+------------------+  ECA        125      12               smooth and                                                                       heterogenous                               +----------+--------+-------+--------+----------------------+------------------+ +----------+--------+-------+----------------+-------------------+  PSV cm/s EDV cms Describe         Arm Pressure (mmHG)   +----------+--------+-------+----------------+-------------------+  Subclavian 95               Multiphasic, WNL                      +----------+--------+-------+----------------+-------------------+ +---------+--------+--+--------+-+---------+  Vertebral PSV cm/s 20 EDV cm/s 6 Antegrade  +---------+--------+--+--------+-+---------+  Left Carotid Findings: +----------+--------+-------+--------+----------------------+------------------+             PSV cm/s EDV     Stenosis Plaque Description     Comments                                 cm/s                                                        +----------+--------+-------+--------+----------------------+------------------+  CCA Prox   117      23                                      intimal thickening  +----------+--------+-------+--------+----------------------+------------------+  CCA Distal 58       17                                                          +----------+--------+-------+--------+----------------------+------------------+  ICA Prox   34       11               smooth and                                                                       heterogenous                               +----------+--------+-------+--------+----------------------+------------------+  ICA Distal 75       34                                                          +----------+--------+-------+--------+----------------------+------------------+  ECA        73       10                                                          +----------+--------+-------+--------+----------------------+------------------+ +----------+--------+--------+----------------+-------------------+  PSV cm/s EDV cm/s Describe         Arm Pressure (mmHG)  +----------+--------+--------+----------------+-------------------+  Subclavian 143               Multiphasic, WNL                      +----------+--------+--------+----------------+-------------------+  +---------+--------+--+--------+--+---------+  Vertebral PSV cm/s 63 EDV cm/s 32 Antegrade  +---------+--------+--+--------+--+---------+   Summary: Right Carotid: Velocities in the right ICA are consistent with a 1-39% stenosis. Left Carotid: Velocities in the left ICA are consistent with a 1-39% stenosis.               Heterogenous area of the left subclavian area noted; etiology               unknown. Vertebrals:  Bilateral vertebral arteries demonstrate antegrade flow. Subclavians: Normal flow hemodynamics were seen in bilateral subclavian              arteries. *See table(s) above for measurements and observations.  Electronically signed by Antony Contras MD on 11/30/2021 at 8:21:24 AM.    Final    VAS [place] LOWER EXTREMITY VENOUS (DVT) (ONLY [facility] & [facility])  Result Date: 11/29/2021  Lower Venous DVT Study Patient Name:  Christopher Randall  Date of Exam:   11/28/2021 Medical Rec #: [id number]          Accession #:    [id number] Date of Birth: 02/17/1961          Patient Gender: M Patient Age:   36 years Exam Location:  [facility] Procedure:      VAS [place] LOWER EXTREMITY VENOUS (DVT) Referring Phys: JOSHUA LONG --------------------------------------------------------------------------------  Indications: Edema, and ulceration.  Risk Factors: Cancer Metastatic non-small cell lung cancer. Comparison Study: No prior study Performing Technologist: Maudry Mayhew MHA, RDMS, RVT, RDCS  Examination Guidelines: A complete evaluation includes B-mode imaging, spectral Doppler, color Doppler, and power Doppler as needed of all accessible portions of each vessel. Bilateral testing is considered an integral part of a complete examination. Limited examinations for reoccurring indications may be performed as noted. The reflux portion of the exam is performed with the patient in reverse Trendelenburg.  +---------+---------------+---------+-----------+----------+--------------+  RIGHT      Compressibility Phasicity Spontaneity Properties Thrombus Aging  +---------+---------------+---------+-----------+----------+--------------+  CFV       Full            Yes       Yes                                    +---------+---------------+---------+-----------+----------+--------------+  SFJ       Full                                                             +---------+---------------+---------+-----------+----------+--------------+  FV Prox   Full                                                             +---------+---------------+---------+-----------+----------+--------------+  FV Mid  Full                                                             +---------+---------------+---------+-----------+----------+--------------+  FV Distal Full                                                             +---------+---------------+---------+-----------+----------+--------------+  PFV       Full                                                             +---------+---------------+---------+-----------+----------+--------------+  POP       Full            Yes       Yes                                    +---------+---------------+---------+-----------+----------+--------------+  PTV       Full                                                             +---------+---------------+---------+-----------+----------+--------------+  PERO      Full                                                             +---------+---------------+---------+-----------+----------+--------------+  Gastroc   None                      No                     Acute           +---------+---------------+---------+-----------+----------+--------------+   +---------+---------------+---------+-----------+----------+--------------+  LEFT      Compressibility Phasicity Spontaneity Properties Thrombus Aging  +---------+---------------+---------+-----------+----------+--------------+  CFV       Full            Yes       Yes                                     +---------+---------------+---------+-----------+----------+--------------+  SFJ       Full                                                             +---------+---------------+---------+-----------+----------+--------------+  FV Prox   Full                                                             +---------+---------------+---------+-----------+----------+--------------+  FV Mid    Full                                                             +---------+---------------+---------+-----------+----------+--------------+  FV Distal Full                                                             +---------+---------------+---------+-----------+----------+--------------+  PFV       Full                                                             +---------+---------------+---------+-----------+----------+--------------+  POP       Full            Yes       Yes                                    +---------+---------------+---------+-----------+----------+--------------+  PTV       Full                                                             +---------+---------------+---------+-----------+----------+--------------+  PERO      Full                                                             +---------+---------------+---------+-----------+----------+--------------+  Gastroc   None                      No                     Acute           +---------+---------------+---------+-----------+----------+--------------+     Summary: RIGHT: - Findings consistent with acute deep vein thrombosis involving the right gastrocnemius veins. - No cystic structure found in the popliteal fossa.  LEFT: - Findings consistent with acute deep vein thrombosis involving the left gastrocnemius veins. - No cystic structure found in the popliteal fossa.  *See table(s) above for measurements and observations. Electronically signed by Harold Barban MD on 11/29/2021 at  7:47:26 PM.    Final    ECHOCARDIOGRAM  LIMITED  Result Date: 11/28/2021    ECHOCARDIOGRAM LIMITED REPORT   Patient Name:   Christopher Randall Date of Exam: 11/28/2021 Medical Rec #:  [id number]         Height:       69.0 in Accession #:    [id number]        Weight:       203.7 lb Date of Birth:  05/04/1961         BSA:          2.082 m Patient Age:    15 years          BP:           100/70 mmHg Patient Gender: M                 HR:           115 bpm. Exam Location:  Inpatient Procedure: Limited Echo Indications:    TIA Stroke Abnormal ECG  History:        Patient has prior history of Echocardiogram examinations.                 Stroke; Signs/Symptoms:Altered Mental Status.  Sonographer:    Johny Chess RDCS Referring Phys: [id number] [place]  1. Limited echo: With definity Overall EF preserved distal septal apical and inferior apical hypokinesis No mural apical thormbus seen. FINDINGS  Additional Comments: Limited echo: With definity Overall EF preserved distal septal apical and inferior apical hypokinesis No mural apical thormbus seen. Jenkins Rouge MD Electronically signed by Jenkins Rouge MD Signature Date/Time: 11/28/2021/2:21:40 PM    Final       IMPRESSION/PLAN: This is a very pleasant 60 year old man with metastatic disease to the brain from [place]. After Brain SRS, Dr. Irene Limbo started systemic therapy. Dr. Irene Limbo is giving him a break from systemic therapy and has concerns that his disease is refractory to his initial regimen per chest CT.  I recommend a 10 fx course of RT to the bulkiest disease in his chest with palliative intent/goal of local control. I have reviewed his medical history and understand he is coping with many comorbid conditions during his inpatient stay.  I had a lengthy discussion with the patient in person and his wife (by phone - wife) after reviewing his imaging personally  We discussed the risks of palliative RT to the chest which may include but not necessarily be limited to: fatigue, skin irritation,  esophagitis, injury to heart/lungs/ spinal cord. We discussed the rationale and logistics of treatment.  After lengthy discussion, the patient  and his wife would like to proceed with treatment. CT simulation today, with expedited RT planning to allow treatment start on 12/19, Monday.  They are pleased with this plan - I communicated plan w/ Dr. Irene Limbo as well.    On date of service, in total, I spent 40 minutes on this encounter. Patient was seen in person.   __________________________________________   Eppie Gibson, MD  This document serves as a record of services personally performed by Eppie Gibson, MD. It was created on her behalf by Roney Mans, a trained medical scribe. The creation of this record is based on the scribe's personal observations and the provider's statements to them. This document has been checked and approved by the attending provider.

## 2021-12-04 NOTE — Consult Note (Signed)
° °  [facility] Inpatient Consult   12/04/2021  Christopher Randall 1961/11/13 [id number]  [place] [facility] [[facility]] Patient:  Primary Care Provider:  Allwardt, Randa Evens, PA-C [place], an Embedded provider  Patient screened for hospitalization with noted extreme high risk score for unplanned readmission risk and  to assess for potential [facility] service needs for post hospital transition.  Review of patient's medical record reveals patient is transitioning to inpatient rehabilitation facility.   Plan:  Current transitional needs will be to a CIR program for post hospital needs.    For questions contact:   Natividad Brood, RN BSN [facility] Liaison  [phone] business mobile phone Toll free office [phone]  Fax number: [phone] [email or web address]

## 2021-12-04 NOTE — Progress Notes (Signed)
[name], MD  Physician Physical Medicine and Rehabilitation PMR Pre-admission    Signed Date of Service:  12/04/2021 12:21 PM  Related encounter: ED to Hosp-Admission (Current) from 11/27/2021 in [facility]   Signed      Show:Clear all '[x]' Written'[x]' Templated'[]' Copied  Added by: '[x]' Cristina Gong, RN'[x]' [name], MD  '[]' Hover for details                                                                                                                                                                                                                                                                                                                                                                                                                                                       PMR Admission Coordinator Pre-Admission Assessment   Patient: Christopher Randall is an 60 y.o., male MRN: [id number] DOB: Apr 25, 1961 Height: '5\' 9"'  (175.3 cm) Weight: 91.1 kg   Insurance Information HMO:     PPO:      PCP:      IPA:      80/20:      OTHER:  PRIMARY: [employer] [place]      Policy#: [id number]      Subscriber: pt CM Name: Amy      Phone#: [phone]     Fax#: [phone] Pre-Cert#:  [id number] approved until 12/28      Employer:  Benefits:  Phone #: [phone]     Name: 12/13 Eff. Date: 09/19/2021     Deduct: $1250      Out of Pocket Max: $4890      Life Max: none CIR: 80%      SNF: 80% 100 days Outpatient: $10 to $52 per visit     Co-Pay: visits per medical neccesity Home Health: 80%      Co-Pay: visits per medical neccesity DME: 80%     Co-Pay: 20% Providers: in network  SECONDARY: none   Financial Counselor:       Phone#:    [facility] for patients in Inpatient Rehabilitation  Facilities with attached Privacy Act [place] Records was provided and verbally reviewed with: N/A   Emergency Contact Information Contact Information       Name Relation Home Work Mobile    [place] Spouse     [phone]         Current Medical History  Patient Admitting Diagnosis: CVA   History of Present Illness: 60 year old male with medical history depression, recent dx of 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 Presented on 11/27/2021 after being seen by oncology with concerns for LE cellulitis.    MRI brain with bilateral anterior and posterior punctuate/small infarcts consistent with embolic shower secondary to hypercoagulable state. MRA distal R VA occlusion, right ACA stenosis. 2 D echo EF 55 % no thrombus or PFO. LE venous doppler bilateral distal DVT. LDL 119, Hgb A1c 5.8. Planned heparin IV transition to Eliquis per discussions with oncologist, Dr Irene Limbo. Thrombocytopenia felt likely due to chemotherapy. On Plavix per cardiology as long as platelets stable >50.     EKG on admission showed ST elevation and cardiology consulted. Deemed not a candidate for invasive angiography due to thrombocytopenia as well as advanced malignancy. Continue metoprolol and statin. 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   Left elbow septic bursitis. Ortho consulted. Fluid aspirate from left elbow on 12/12 with preliminary cultures growing gram positive cocci. ID consulted. Was planned for washout but cancelled due to improvement with antibiotics . ID recommends 2 to 3 weeks of antibiotic given immunosuppression. On cefadroxil daily.    CT scan of chest showed interstitial and ground glass opacities in the upper greater than lower lobes consistent with edema. Pneumonia combination or lymphangitic carcinomatosis. Kept on broad spectrum antibiotics for a week.    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 Case discussed with oncology and radiation. On 12/16 got simulation for radiation treatment. To receive 10 doses/fractions of radiation daily per Dr Lanell Persons and follow up with Dr Irene Limbo after rehab for potential additional chemotherapy.    Complete NIHSS TOTAL: 0   Patient's [facility] has been reviewed by the rehabilitation admission coordinator and physician.   Past Medical History      Past Medical History:  Diagnosis Date   Abnormal TSH 09/28/2017   Anemia 11/27/2021   Anxiety 07/03/2012   Depression     Difficulty sleeping 09/28/2017   Examination of participant in clinical trial 09/11/2012   History of immunotherapy 02/23/2016   Melanoma of skin 10/26/2011   Nicotine dependence 07/17/2015   Smoker     STEMI (ST elevation myocardial infarction) ([place]) 11/27/2021    Has the  patient had major surgery during 100 days prior to admission? No   Family History   family history includes CAD in his mother; Hypertension in his mother; Lung cancer in his father.   Current Medications   Current Facility-Administered Medications:    0.9 %  sodium chloride infusion (Manually program via Guardrails IV Fluids), , Intravenous, Once, Deland Pretty, MD   0.9 %  sodium chloride infusion, , Intravenous, PRN, Orma Flaming, MD, Last Rate: 10 mL/hr at 12/01/21 2334, Infusion Verify at 12/01/21 2334   acetaminophen (TYLENOL) tablet 650 mg, 650 mg, Oral, Q6H PRN **OR** acetaminophen (TYLENOL) suppository 650 mg, 650 mg, Rectal, Q6H PRN, Orma Flaming, MD   apixaban Arne [place]) tablet 5 mg, 5 mg, Oral, BID, Dahal, Binaya, MD, 5 mg at 12/04/21 1127   atorvastatin (LIPITOR) tablet 40 mg, 40 mg, Oral, Daily, Rosalin Hawking, MD, 40 mg at 12/04/21 1127   cefadroxil (DURICEF) capsule 1,000 mg, 1,000 mg, Oral, BID, Mignon Pine, DO, 1,000 mg at 12/04/21 1126   Chlorhexidine Gluconate Cloth 2 % PADS 6 each, 6 each, Topical, Daily, Orma Flaming, MD, 6 each at 12/02/21 0814   clonazePAM (KLONOPIN) tablet 0.5 mg, 0.5 mg, Oral, BID PRN, Orma Flaming, MD, 0.5 mg at 12/03/21 2218   [START ON 12/05/2021] clopidogrel (PLAVIX) tablet 75 mg, 75 mg, Oral, Daily, Dahal, Binaya, MD   dexamethasone (DECADRON) tablet 1 mg, 1 mg, Oral, Q breakfast, Orma Flaming, MD, 1 mg at 12/04/21 1152   fluconazole (DIFLUCAN) tablet 100 mg, 100 mg, Oral, Daily, Orma Flaming, MD, 100 mg at 57/01/77 9390   folic acid (FOLVITE) tablet 1 mg, 1 mg, Oral, Daily, Orma Flaming, MD, 1 mg at 12/04/21 1126   furosemide (LASIX) tablet 40 mg, 40 mg, Oral, Daily, Dahal, Binaya, MD, 40 mg at 12/04/21 1126   guaiFENesin (MUCINEX) 12 hr tablet 1,200 mg, 1,200 mg, Oral, BID, Dahal, Binaya, MD, 1,200 mg at 12/04/21 1126   HYDROcodone-acetaminophen (NORCO/VICODIN) 5-325 MG per tablet 1-2 tablet, 1-2 tablet, Oral, Q4H PRN, Orma Flaming, MD, 2 tablet at 12/03/21 1823   ipratropium-albuterol (DUONEB) 0.5-2.5 (3) MG/3ML nebulizer solution 3 mL, 3 mL, Nebulization, Q6H PRN, Dahal, Marlowe Aschoff, MD, 3 mL at 12/03/21 1838   magic mouthwash w/lidocaine, 5 mL, Oral, TID PRN, Orma Flaming, MD, 5 mL at 11/30/21 1051   megestrol (MEGACE) tablet 160 mg, 160 mg, Oral, Daily, Jacky Kindle, MD, 160 mg at 12/04/21 1133   metoprolol tartrate (LOPRESSOR) tablet 12.5 mg, 12.5 mg, Oral, BID, O'Neal, Cassie Freer, MD, 12.5 mg at 12/04/21 1126   mupirocin ointment (BACTROBAN) 2 % 1 application, 1 application, Nasal, BID, Dahal, Binaya, MD, 1 application at 30/09/23 1132   nicotine (NICODERM CQ - dosed in mg/24 hr) patch 7 mg, 7 mg, Transdermal, Daily, Byrum, Rose Fillers, MD, 7 mg at 12/04/21 1125   ondansetron (ZOFRAN) tablet 4 mg, 4 mg, Oral, Q6H PRN **OR** ondansetron (ZOFRAN) injection 4 mg, 4 mg, Intravenous, Q6H PRN, Orma Flaming, MD, 4 mg at 12/01/21 0847   pantoprazole (PROTONIX) EC tablet 40 mg, 40 mg, Oral, Daily, Orma Flaming, MD, 40 mg at 12/04/21 1126   polyethylene glycol (MIRALAX  / GLYCOLAX) packet 17 g, 17 g, Oral, Daily PRN, Dahal, Binaya, MD   polyvinyl alcohol (LIQUIFILM TEARS) 1.4 % ophthalmic solution 1 drop, 1 drop, Both Eyes, QID PRN, Orma Flaming, MD   silver sulfADIAZINE (SILVADENE) 1 % cream, , Topical, BID, Collene Gobble, MD, Given at 12/03/21 2210   vitamin B-12 (CYANOCOBALAMIN)  tablet 1,000 mcg, 1,000 mcg, Oral, Daily, Orma Flaming, MD, 1,000 mcg at 12/04/21 1126   Patients Current Diet:  Diet Order                  Diet general             Diet Heart Room service appropriate? Yes; Fluid consistency: Thin  Diet effective now                       Precautions / Restrictions Precautions Precautions: Fall Precaution Comments: L elbow septic bursitis, B heel wounds, monitor SpO2 Restrictions Weight Bearing Restrictions: No Other Position/Activity Restrictions: SBP <180, B DVT    Has the patient had 2 or more falls or a fall with injury in the past year? No   Prior Activity Level Community (5-7x/wk): Independent, very active, just retired September   Prior Functional Level Self Care: Did the patient need help bathing, dressing, using the toilet or eating? Independent   Indoor Mobility: Did the patient need assistance with walking from room to room (with or without device)? Independent   Stairs: Did the patient need assistance with internal or external stairs (with or without device)? Independent   Functional Cognition: Did the patient need help planning regular tasks such as shopping or remembering to take medications? Independent   Patient Information Are you of Hispanic, Latino/a,or Spanish origin?: A. No, not of Hispanic, Latino/a, or Spanish origin What is your race?: A. White Do you need or want an interpreter to communicate with a doctor or health care staff?: 0. No   Patient's Response To:  Health Literacy and Transportation Is the patient able to respond to health literacy and transportation needs?: Yes Health Literacy -  How often do you need to have someone help you when you read instructions, pamphlets, or other written material from your doctor or pharmacy?: Never In the past 12 months, has lack of transportation kept you from medical appointments or from getting medications?: No In the past 12 months, has lack of transportation kept you from meetings, work, or from getting things needed for daily living?: No   Development worker, international aid / Equipment Home Equipment: Cane - quad, Shower seat   Prior Device Use: Indicate devices/aids used by the patient prior to current illness, exacerbation or injury? None of the above   Current Functional Level Cognition   Arousal/Alertness: Awake/alert Overall Cognitive Status: Impaired/Different from baseline Current Attention Level: Sustained Orientation Level: Oriented X4 Following Commands: Follows one step commands consistently Safety/Judgement: Decreased awareness of safety, Decreased awareness of deficits General Comments: Decreased awareness of deficits but more agreeable to sit and rest today when dyspneic. SpO2 WFL on 4L O2 [place] with exertion, but DOE 3/4 Attention: Sustained Sustained Attention: Impaired Sustained Attention Impairment: Verbal basic Memory: Impaired Memory Impairment: Storage deficit Awareness: Appears intact Executive Function: Decision Making Decision Making: Impaired    Extremity Assessment (includes Sensation/Coordination)   Upper Extremity Assessment: LUE deficits/detail LUE Deficits / Details: L elbow is swollen, red and painful. ROM is WFL. Encourgaged stretching and ROM.  Lower Extremity Assessment: Defer to PT evaluation LLE Deficits / Details: weeping, discoloration, swelling ankles and feet L>R LLE Sensation:  (hypersensitive) LLE Coordination: decreased gross motor     ADLs   Overall ADL's : Needs assistance/impaired Eating/Feeding: Set up, Sitting Grooming: Minimal assistance, Sitting Upper Body Bathing: Minimal assistance,  Sitting Lower Body Bathing: Maximal assistance, Cueing for safety, Sitting/lateral leans Upper Body Dressing : Minimal  assistance, Sitting Lower Body Dressing: Maximal assistance, Sitting/lateral leans, Sit to/from stand Toilet Transfer: Minimal assistance, +2 for safety/equipment, Ambulation Toilet Transfer Details (indicate cue type and reason): min A for verbal cues and LOB Toileting- Clothing Manipulation and Hygiene: Maximal assistance, Sitting/lateral lean, Sit to/from stand, Cueing for safety, Cueing for sequencing Functional mobility during ADLs: Minimal assistance, Moderate assistance, +2 for physical assistance, +2 for safety/equipment General ADL Comments: Session focused on incrased OOB activity tolerance. Pt tolerated ~5 minutes of amblation without AD, but did have 2 LOB that required mod A +2 to correct.     Mobility   Overal bed mobility: Needs Assistance Bed Mobility: Supine to Sit Supine to sit: Supervision General bed mobility comments: received in chair     Transfers   Overall transfer level: Needs assistance Equipment used: Rolling walker (2 wheels) Transfers: Sit to/from Stand Sit to Stand: Min guard General transfer comment: from chair x5 reps total     Ambulation / Gait / Stairs / Wheelchair Mobility   Ambulation/Gait Ambulation/Gait assistance: +2 safety/equipment, Min assist Gait Distance (Feet): 75 Feet (36f, 770fx2, 5065fith seated breaks between) Assistive device: Rolling walker (2 wheels) Gait Pattern/deviations: Step-through pattern, Decreased stride length, Narrow base of support, Drifts right/left, Trunk flexed General Gait Details: Pt with narrow BOS, fair use of RW, close chair follow for safety. Pt anxious re: gait belt but understanding need for it and for seated breaks today. Gait velocity: decreased Gait velocity interpretation: <1.8 ft/sec, indicate of risk for recurrent falls Stairs: Yes Stairs assistance: Min assist, +2  safety/equipment Stair Management: Two rails, Step to pattern, Forwards Number of Stairs: 4 (2+2) General stair comments: cues for safety/sequencing, no buckling but pt more dyspneic after needed seated break next to stairs     Posture / Balance Balance Overall balance assessment: Needs assistance Sitting-balance support: Feet unsupported, No upper extremity supported Sitting balance-Leahy Scale: Good Standing balance support: No upper extremity supported, During functional activity Standing balance-Leahy Scale: Fair Standing balance comment: Fair to good with RW support; needs external support when unsupported     Special needs/care consideration [place]: 12/19     1245 12/20     1235 12/21     1215 12/22     1200   12/23     1215 12/26     no radiation 12/27     215 12/28     245 12/29     115 12/30     115 1/1          no radiation 1/3          130 last day of radiation for 10 total KatEsmond Camper at Radiation can be reached at [phone] lead Radiation for scheduling issues can be reached at [email or web address] contacted 12/16 at 1300 to arrange transport daily for pickup 30 minutes prior to scheduled appointment daily    Previous Home Environment  Living Arrangements: Spouse/significant other  Lives With: Spouse Available Help at Discharge:  (wife to take FMLA) Type of Home: House Home Layout: Two level, Able to live on main level with bedroom/bathroom Home Access: Level entry Bathroom Shower/Tub: WalMultimedia programmerandicapped height Bathroom Accessibility: Yes [place] Additional Comments: wife has taken FMLA to be with him. Children and other family live nearby as well   Discharge Living Setting Plans for Discharge Living Setting: Patient's home, Lives with (comment) (wife)  Type of Home at Discharge: House Discharge Home Layout: Two level, Able to live on main  level with bedroom/bathroom Discharge Home Access: Level entry Discharge Bathroom Shower/Tub: Walk-in shower Discharge Bathroom Toilet: Handicapped height Discharge Bathroom Accessibility: Yes How Accessible: Accessible via walker Does the patient have any problems obtaining your medications?: No   Social/Family/Support Systems Patient Roles: Spouse, Parent Contact Information: wife, Sharyn Lull Anticipated Caregiver: wife and family Anticipated Caregiver's Contact Information: [phone] Ability/Limitations of Caregiver: to take FMLA Caregiver Availability: 24/7 Discharge Plan Discussed with Primary Caregiver: Yes Is Caregiver In Agreement with Plan?: Yes Does Caregiver/Family have Issues with Lodging/Transportation while Pt is in Rehab?: No   Goals Patient/Family Goal for Rehab: supervision to min PT and OT, supervision with SLP Expected length of stay: ELOS 10 to 12 days Pt/Family Agrees to Admission and willing to participate: Yes Program Orientation Provided & Reviewed with Pt/Caregiver Including Roles  & Responsibilities: Yes   Decrease burden of Care through IP rehab admission: n/a   Possible need for SNF placement upon discharge: not anticipated   Patient Condition: I have reviewed medical records from [facility], spoken with CM, and patient, spouse, and son. I met with patient at the bedside for inpatient rehabilitation assessment.  Patient will benefit from ongoing PT, OT, and SLP, can actively participate in 3 hours of therapy a day 5 days of the week, and can make measurable gains during the admission.  Patient will also benefit from the coordinated team approach during an Inpatient Acute Rehabilitation admission.  The patient will receive intensive therapy as well as Rehabilitation physician, nursing, social worker, and care management interventions.  Due to bladder management, bowel management, safety, skin/wound care, disease management, medication administration,  pain management, and patient education the patient requires 24 hour a day rehabilitation nursing.  The patient is currently min assist overall with mobility and basic ADLs.  Discharge setting and therapy post discharge at home with outpatient is anticipated.  Patient has agreed to participate in the Acute Inpatient Rehabilitation Program and will admit today.   Preadmission Screen Completed By:  Cleatrice Burke, 12/04/2021 1:11 PM ______________________________________________________________________   Discussed status with Dr. Naaman Plummer on 12/04/2021 at 1311 and received approval for admission today.   Admission Coordinator:  Cleatrice Burke, RN, time 1311 Date 12/04/2021    Assessment/Plan: Diagnosis: embolic CVA's Does the need for close, 24 hr/day Medical supervision in concert with the patient's rehab needs make it unreasonable for this patient to be served in a less intensive setting? Yes Co-Morbidities requiring supervision/potential complications: lung cancer with mets, left olecranon bursitis Due to bladder management, bowel management, safety, skin/wound care, disease management, medication administration, pain management, and patient education, does the patient require 24 hr/day rehab nursing? Yes Does the patient require coordinated care of a physician, rehab nurse, PT, OT, and SLP to address physical and functional deficits in the context of the above medical diagnosis(es)? Yes Addressing deficits in the following areas: balance, endurance, locomotion, strength, transferring, bowel/bladder control, bathing, dressing, feeding, grooming, toileting, cognition, and psychosocial support Can the patient actively participate in an intensive therapy program of at least 3 hrs of therapy 5 days a week? Yes The potential for patient to make measurable gains while on inpatient rehab is excellent Anticipated functional outcomes upon discharge from inpatient rehab: supervision and min  assist PT, supervision and min assist OT, supervision SLP Estimated rehab length of stay to reach the above functional goals is: 10-12 days Anticipated discharge destination: Home 10. Overall  Rehab/Functional Prognosis: excellent     MD Signature: [name], MD, [place] Director Rehabilitation Services 12/04/2021          Revision History                          Note Details  Author [name], MD File Time 12/04/2021  1:34 PM  Author Type Physician Status Signed  Last Editor [name], MD Service Physical Medicine and Rehabilitation

## 2021-12-04 NOTE — H&P (Addendum)
Physical Medicine and Rehabilitation Admission H&P     CC: Functional deficits   HPI: Christopher Randall. Clipper is a 60 year old male with history of melanoma, anxiety/depression, diagnosis of NSCLC with mets to the brain10/21/22, reports of hallucinations w/headaches since thanksgiving and recent initiation of chemo/immunotherapy/XRT. He was unable to proceed with full dose chemo due to thrombocytopenia.  He was admitted on 11/27/21 from Hem/onc office with edema BLE with cellulitis and drainage as well as 3-4 days of increased confusion since XRT. MRI brain done revealing numerous small foci of acute to subacute infarcts involving multiple vascular territories suggestive for central embolic source.  MRA brain showed moderate stenosis right ACA and loss of flow signal related to distal right-VA. BLE Doppler showed acute DVT right and left gastrocnemius vein.  Patient noted to have significant hyponatremia at admission with sodium 119, abnormal LFTs, leukocytosis with white count 67K and thrombocytopenia with platelets at 29K and chest x-ray done showing bilateral airspace disease with small right effusion concerning for multifocal pneumonia and he was started on IV antibiotics for PNA and cellulitis.  He also reported chest pain with elevated cardiac enzyme and was started on IV heparin.  2D echo showed EF 55 to 60% with hypokinesis of apex.  He was transfused with platelets and started on low-dose ASA and IV heparin.  Dr. Audie Box  was consulted for input due to STEMI and felt that patient with stress-induced cardiomyopathy, pt was not a candidate for invasive angiopathy and recommended medical management with plavix and BB.     Dr. Marin Olp was consulted for input and felt that low sodium could be from dehydration and treatment.  Dr. Erlinda Hong felt that stroke emboli right PE due to hypercoagulable state from advanced malignancy.  He reported left elbow pain with swelling on 12/12 which was concerning for septic  bursitis.  Elbow was aspirated and sent for culture with question of formal I&D.  Cultures were positive for abundant staph aureus and CT chest done 12/14 showing worsening of extensive mediastinal and hilar adenopathy with enlargement of supraclavicular and abdominal retroperitoneal nodes, enlarging RUL mass collapsing RML and effacing proximal bronchi, cardiomegaly with increased pericardial effusion and interstitial and groundglass opacities consistent with pneumonia and /or lymphangitic carcinomatosis. Antibiotics narrowed to Duricef x 2- 3 weeks  starting 12/15 per input from Dr. Juleen China and to follow up with ID in 2 weeks.  As patient had improvement in pain with decrease in edema after aspiration,  formal I&D was needed per Dr. Lucia Gaskins.  He was transitioned to Eliquis on 12/15      Review of Systems  Constitutional:  Negative for chills and fever.  HENT:  Negative for hearing loss.   Eyes:  Positive for blurred vision. Negative for double vision.       [place]line glaucoma and degenerative eye disease.  Respiratory:  Positive for cough, sputum production (increased in the past few days), shortness of breath and wheezing.   Cardiovascular:  Positive for leg swelling. Negative for chest pain.  Gastrointestinal:  Positive for nausea (more in the past 2 days). Negative for abdominal pain and constipation.  Genitourinary:  Negative for dysuria, flank pain and frequency.       Hesitancy --difficult to urinate and needs multiple maneuvers.   Musculoskeletal:  Positive for myalgias.  Skin:  Negative for itching and rash.  Neurological:  Positive for weakness. Negative for dizziness and headaches.  Psychiatric/Behavioral:  Positive for hallucinations (since thanksgiving and has been having conversations  during sleep) and memory loss. The patient has insomnia (anxiety and SOB--can't get breath. Fear of death/leaving family alone.).           Past Medical History:  Diagnosis Date   Abnormal TSH  09/28/2017   Anemia 11/27/2021   Anxiety 07/03/2012   Depression     Difficulty sleeping 09/28/2017   Examination of participant in clinical trial 09/11/2012   History of immunotherapy 02/23/2016   Melanoma of skin 10/26/2011   Nicotine dependence 07/17/2015   Smoker     STEMI (ST elevation myocardial infarction) ([place]) 11/27/2021           Past Surgical History:  Procedure Laterality Date   BRONCHIAL BIOPSY   10/09/2021    Procedure: BRONCHIAL BIOPSIES;  Surgeon: [name], MD;  Location: [facility];  Service: Endoscopy;;   BRONCHIAL BRUSHINGS   10/09/2021    Procedure: BRONCHIAL BRUSHINGS;  Surgeon: [name], MD;  Location: [facility];  Service: Endoscopy;;   ENDOBRONCHIAL ULTRASOUND N/A 10/09/2021    Procedure: ENDOBRONCHIAL ULTRASOUND;  Surgeon: [name], MD;  Location: [facility];  Service: Endoscopy;  Laterality: N/A;   FINE NEEDLE ASPIRATION   10/09/2021    Procedure: FINE NEEDLE ASPIRATION (FNA) LINEAR;  Surgeon: [name], MD;  Location: [facility];  Service: Endoscopy;;   LEFT POPLITEAL MASS RESECTION   08/27/2011    pigmented villonodular synovitis, negative for malignancy   LYMPH GLAND EXCISION   07/12/2011    right axilla, consistent with melanoma   LYMPH NODE DISSECTION   08/27/2011    right, 0/17 nodes positive   VIDEO BRONCHOSCOPY N/A 10/09/2021    Procedure: VIDEO BRONCHOSCOPY WITHOUT FLUORO;  Surgeon: [name], MD;  Location: [facility];  Service: Endoscopy;  Laterality: N/A;           Family History  Problem Relation Age of Onset   Hypertension Mother     CAD Mother     Lung cancer Father        Social History:  Married. 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  Has 5 children 27-81 years old who live locally. He reports that he has quit smoking. His smoking use included cigarettes. He smoked an average of 1.5 PPD and quit in Sept 2022. He has never used smokeless tobacco. He  reports current alcohol use of about 6.0 standard drinks per week. He reports that he does not use drugs.          Allergies  Allergen Reactions   Iopamidol Hives and Itching      Isovue break through with prep, Hives and Itching   Patient had PET?CT scan with 125 mls of Isovue-300. Noted to have two raised itchy hives which self-resolved. Patient had PET/CT scan with contrast on 05/27/2014 while on prednisone prep (3 doses of 50 mg-13 hour prep) and noted to have two hives which self -resolved without treatment   Iodinated Diagnostic Agents Rash   Iodine-131 Rash            Medications Prior to Admission  Medication Sig Dispense Refill   dexamethasone (DECADRON) 1 MG tablet Take 1 tablet (1 mg total) by mouth daily with breakfast. 30 tablet 1   Diclofenac Sodium 1.5 % SOLN Place one application on to the skin 2x daily. (Patient taking differently: Apply 1 each topically daily as needed (cramps & aches).) 1.5 Bottle 1   fluconazole (DIFLUCAN) 100 MG tablet Take 2 tablets today, then 1  tablet daily x 20 more days. (Patient taking differently: Take 100 mg by mouth daily.) 35 tablet 0   folic acid (FOLVITE) 1 MG tablet Take 1 tablet (1 mg total) by mouth daily. Start 7 days before pemetrexed chemotherapy. Continue until 21 days after pemetrexed completed. 100 tablet 3   furosemide (LASIX) 20 MG tablet Take 1 tablet (20 mg total) by mouth daily. 30 tablet 0   magic mouthwash (lidocaine, diphenhydrAMINE, alum & mag hydroxide) suspension Swish and spit 5 mls by mouth every 4-6 hours as needed (Patient taking differently: Swish and spit 5 mLs daily as needed for mouth pain.) 240 mL 2   Multiple Vitamins-Minerals (ICAPS AREDS 2 PO) Take 1 capsule by mouth daily.       naphazoline-pheniramine (VISINE) 0.025-0.3 % ophthalmic solution 1 drop 4 (four) times daily as needed for eye irritation.       nicotine polacrilex (COMMIT) 4 MG lozenge Take 4 mg by mouth 2 (two) times daily as needed for smoking  cessation.       omeprazole (PRILOSEC) 20 MG capsule Take daily while on steroids. (Patient taking differently: 20 mg daily.) 60 capsule 0   ondansetron (ZOFRAN) 8 MG tablet Take 1 tablet (8 mg total) by mouth 2 (two) times daily as needed (Nausea or vomiting). Start if needed on the third day after cisplatin. (Patient taking differently: Take 8 mg by mouth 2 (two) times daily as needed for nausea or vomiting.) 30 tablet 1   prochlorperazine (COMPAZINE) 10 MG tablet Take 1 tablet (10 mg total) by mouth every 6 (six) hours as needed (Nausea or vomiting). (Patient taking differently: Take 10 mg by mouth every 6 (six) hours as needed for nausea or vomiting.) 30 tablet 1   sodium chloride (OCEAN) 0.65 % SOLN nasal spray Place 1 spray into both nostrils daily as needed for congestion.       vitamin B-12 (CYANOCOBALAMIN) 1000 MCG tablet Take 1,000 mcg by mouth daily.       lidocaine-prilocaine (EMLA) cream Apply to affected area once (Patient not taking: Reported on 11/27/2021) 30 g 3   oseltamivir (TAMIFLU) 75 MG capsule Take 1 capsule (75 mg total) by mouth 2 (two) times daily. (Patient not taking: Reported on 11/27/2021) 10 capsule 0      Drug Regimen Review  Drug regimen was reviewed and remains appropriate with no significant issues identified   Home: Home Living Family/patient expects to be discharged to:: Private residence Living Arrangements: Spouse/significant other Available Help at Discharge: Family, Available 24 hours/day Type of Home: House Home Access: Level entry Home Layout: Two level, Able to live on main level with bedroom/bathroom Bathroom Shower/Tub: Multimedia programmer: Handicapped height Bathroom Accessibility: Yes Home Equipment: Cane - quad, Shower seat Additional Comments: wife has taken FMLA to be with him. Children and other family live nearby as well   Functional History: Prior Function Prior Level of Function : Independent/Modified Independent,  Driving Mobility Comments: independent until past couple of days. Just retired. Likes to mountain bike, walks 5 mi/ day, goes to gym 5 days/wk but is a smoker ADLs Comments: independent   Functional Status:  Mobility: Bed Mobility Overal bed mobility: Needs Assistance Bed Mobility: Supine to Sit Supine to sit: Supervision General bed mobility comments: received in chair Transfers Overall transfer level: Needs assistance Equipment used: Rolling walker (2 wheels) Transfers: Sit to/from Stand Sit to Stand: Min guard General transfer comment: from chair x5 reps total Ambulation/Gait Ambulation/Gait assistance: +2 safety/equipment, Min assist  Gait Distance (Feet): 75 Feet (78ft, 35ft x2, 65ft with seated breaks between) Assistive device: Rolling walker (2 wheels) Gait Pattern/deviations: Step-through pattern, Decreased stride length, Narrow base of support, Drifts right/left, Trunk flexed General Gait Details: Pt with narrow BOS, fair use of RW, close chair follow for safety. Pt anxious re: gait belt but understanding need for it and for seated breaks today. Gait velocity: decreased Gait velocity interpretation: <1.8 ft/sec, indicate of risk for recurrent falls Stairs: Yes Stairs assistance: Min assist, +2 safety/equipment Stair Management: Two rails, Step to pattern, Forwards Number of Stairs: 4 (2+2) General stair comments: cues for safety/sequencing, no buckling but pt more dyspneic after needed seated break next to stairs   ADL: ADL Overall ADL's : Needs assistance/impaired Eating/Feeding: Set up, Sitting Grooming: Minimal assistance, Sitting Upper Body Bathing: Minimal assistance, Sitting Lower Body Bathing: Maximal assistance, Cueing for safety, Sitting/lateral leans Upper Body Dressing : Minimal assistance, Sitting Lower Body Dressing: Maximal assistance, Sitting/lateral leans, Sit to/from stand Toilet Transfer: Minimal assistance, +2 for safety/equipment, Ambulation Toilet  Transfer Details (indicate cue type and reason): min A for verbal cues and LOB Toileting- Clothing Manipulation and Hygiene: Maximal assistance, Sitting/lateral lean, Sit to/from stand, Cueing for safety, Cueing for sequencing Functional mobility during ADLs: Minimal assistance, Moderate assistance, +2 for physical assistance, +2 for safety/equipment General ADL Comments: Session focused on incrased OOB activity tolerance. Pt tolerated ~5 minutes of amblation without AD, but did have 2 LOB that required mod A +2 to correct.   Cognition: Cognition Overall Cognitive Status: Impaired/Different from baseline Arousal/Alertness: Awake/alert Orientation Level: Oriented X4 Attention: Sustained Sustained Attention: Impaired Sustained Attention Impairment: Verbal basic Memory: Impaired Memory Impairment: Storage deficit Awareness: Appears intact Executive Function: Decision Making Decision Making: Impaired Cognition Arousal/Alertness: Awake/alert Behavior During Therapy: WFL for tasks assessed/performed, Impulsive Overall Cognitive Status: Impaired/Different from baseline Area of Impairment: Following commands, Safety/judgement, Awareness, Problem solving Current Attention Level: Sustained Memory: Decreased recall of precautions, Decreased short-term memory Following Commands: Follows one step commands consistently Safety/Judgement: Decreased awareness of safety, Decreased awareness of deficits Awareness: Emergent Problem Solving: Difficulty sequencing, Requires verbal cues General Comments: Decreased awareness of deficits but more agreeable to sit and rest today when dyspneic. SpO2 WFL on 4L O2 [place] with exertion, but DOE 3/4     Blood pressure (!) 123/93, pulse (!) 118, temperature 97.6 F (36.4 C), temperature source Axillary, resp. rate 14, height 5\' 9"  (1.753 m), weight 91.1 kg, SpO2 96 %. Physical Exam Vitals and nursing note reviewed.  Constitutional:      Appearance: Normal  appearance. He is ill-appearing.  HENT:     Head: Normocephalic and atraumatic.     Right Ear: External ear normal.     Left Ear: External ear normal.     Nose: Nose normal.     Mouth/Throat:     Mouth: Mucous membranes are moist.  Eyes:     Extraocular Movements: Extraocular movements intact.     Pupils: Pupils are equal, round, and reactive to light.  Cardiovascular:     Rate and Rhythm: Tachycardia present.     Heart sounds: No murmur heard.   No gallop.  Pulmonary:     Effort: Pulmonary effort is normal. No respiratory distress.     Breath sounds: Normal breath sounds. No wheezing.  Abdominal:     General: There is no distension.     Palpations: Abdomen is soft.     Tenderness: There is no abdominal tenderness.  Musculoskeletal:     Cervical  back: Normal range of motion.  Skin:    General: Skin is warm.     Comments: Left elbow with resolving erythema/edema with flaky skin. Right elbow red with few scabbed areas. Mild drainage from aspiration site and another just above. BLE with 2+ pedal edema and dressed bilateral ankle/foot wounds with ecchymoses, dressed from knees down to mid foot.   Neurological:     Mental Status: He is alert and oriented to person, place, and time.     Comments: Pt with reasonable insight and awareness. Some delays in processing and with conversation. Functional memory. Complained of mild diplopia when tracking my finger. UE. Strength 4-/5 UE's prox to 4/5 distally. LE: 3+ prox to 4- distally. DTR's 1+. Decreased [place] but without ataxia.   Psychiatric:        Mood and Affect: Mood normal.        Behavior: Behavior normal.      Lab Results Last 48 Hours        Results for orders placed or performed during the hospital encounter of 11/27/21 (from the past 48 hour(s))  Surgical PCR screen     Status: Abnormal    Collection Time: 12/02/21  8:50 PM    Specimen: Nasal Mucosa; Nasal Swab  Result Value Ref Range    MRSA, PCR NEGATIVE NEGATIVE     Staphylococcus aureus POSITIVE (A) NEGATIVE      Comment: (NOTE) The Xpert SA Assay (FDA approved for NASAL specimens in patients 97 years of age and older), is one component of a comprehensive surveillance program. It is not intended to diagnose infection nor to guide or monitor treatment. Performed at [facility], [place] [address] [place], [place] [address]    CBC     Status: Abnormal    Collection Time: 12/03/21  3:31 AM  Result Value Ref Range    WBC 40.5 (H) 4.0 - 10.5 K/uL    RBC 3.25 (L) 4.22 - 5.81 MIL/uL    Hemoglobin 10.2 (L) 13.0 - 17.0 g/dL    HCT 29.5 (L) 39.0 - 52.0 %    MCV 90.8 80.0 - 100.0 fL    MCH 31.4 26.0 - 34.0 pg    MCHC 34.6 30.0 - 36.0 g/dL    RDW 15.6 (H) 11.5 - 15.5 %    Platelets 90 (L) 150 - 400 K/uL      Comment: Immature Platelet Fraction may be clinically indicated, consider ordering this additional test [id number] CONSISTENT WITH PREVIOUS RESULT REPEATED TO VERIFY      nRBC 0.1 0.0 - 0.2 %      Comment: Performed at [facility], [place] [address] [place], [place] [address]  Heparin level (unfractionated)     Status: None    Collection Time: 12/03/21  3:31 AM  Result Value Ref Range    Heparin Unfractionated 0.37 0.30 - 0.70 IU/mL      Comment: (NOTE) The clinical reportable range upper limit is being lowered to >1.10 to align with the FDA approved guidance for the current laboratory assay.   If heparin results are below expected values, and patient dosage has  been confirmed, suggest follow up testing of antithrombin III levels. Performed at [facility], [place] [address] [place],  [address]    Basic metabolic panel     Status: Abnormal    Collection Time: 12/03/21  3:31 AM  Result Value Ref Range    Sodium 126 (L) 135 - 145 mmol/L  Potassium 3.7 3.5 - 5.1 mmol/L    Chloride 90 (L) 98 - 111 mmol/L    CO2 24 22 - 32 mmol/L    Glucose, Bld 93 70 - 99 mg/dL      Comment: Glucose reference range applies only to  samples taken after fasting for at least 8 hours.    BUN 12 6 - 20 mg/dL    Creatinine, Ser 0.78 0.61 - 1.24 mg/dL    Calcium 8.0 (L) 8.9 - 10.3 mg/dL    GFR, Estimated >60 >60 mL/min      Comment: (NOTE) Calculated using the CKD-EPI Creatinine Equation (2021)      Anion gap 12 5 - 15      Comment: Performed at [facility] [address], [place], [place] [address]  CBC     Status: Abnormal    Collection Time: 12/04/21  2:34 AM  Result Value Ref Range    WBC 40.2 (H) 4.0 - 10.5 K/uL    RBC 3.37 (L) 4.22 - 5.81 MIL/uL    Hemoglobin 10.7 (L) 13.0 - 17.0 g/dL    HCT 30.3 (L) 39.0 - 52.0 %    MCV 89.9 80.0 - 100.0 fL    MCH 31.8 26.0 - 34.0 pg    MCHC 35.3 30.0 - 36.0 g/dL    RDW 15.9 (H) 11.5 - 15.5 %    Platelets 70 (L) 150 - 400 K/uL      Comment: Immature Platelet Fraction may be clinically indicated, consider ordering this additional test [id number] REPEATED TO VERIFY      nRBC 0.1 0.0 - 0.2 %      Comment: Performed at [facility], [place] [address], [place], [place] [address]  Basic metabolic panel     Status: Abnormal    Collection Time: 12/04/21  2:34 AM  Result Value Ref Range    Sodium 124 (L) 135 - 145 mmol/L    Potassium 3.5 3.5 - 5.1 mmol/L    Chloride 88 (L) 98 - 111 mmol/L    CO2 24 22 - 32 mmol/L    Glucose, Bld 102 (H) 70 - 99 mg/dL      Comment: Glucose reference range applies only to samples taken after fasting for at least 8 hours.    BUN 20 6 - 20 mg/dL    Creatinine, Ser 0.92 0.61 - 1.24 mg/dL    Calcium 7.9 (L) 8.9 - 10.3 mg/dL    GFR, Estimated >60 >60 mL/min      Comment: (NOTE) Calculated using the CKD-EPI Creatinine Equation (2021)      Anion gap 12 5 - 15      Comment: Performed at [facility] [address] [place], [place] [address]      Imaging Results (Last 48 hours)  No results found.           Medical Problem List and Plan: 1. 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  Antithrombotics: -DVT/anticoagulation:  Pharmaceutical: Other (comment)--Eliquis             -  antiplatelet therapy: N/A 3. Pain Management: Tylenol 4. Mood: pt struggling with diagnosis and end of life thoughts -would like for him to speak with neuropsychology while he's here -palliative care also would be appropriate.  -remeron trial (discussed with pt/wife)             -antipsychotic agents: N/A 5. Neuropsych: This patient is not fully capable of making decisions on his own behalf. 6. Skin/Wound Care:  Routine pressure relief measures             --Monitor elbow/BLE for healing                          -add warm moist compress to elbow bid, otherwise wrap with kerlex             -might benefit from a darco-type shoe for gait given his heel wounds 7. Fluids/Electrolytes/Severe protein deficient malutrition: Monitor I/O. Check CMET in am.              -add protein supplement             -add remeron for mood/appetite as opposed to megace given his hypercoagulable state 8. Metastatic lung cancer: Now on oxygen--using 3-4 liters. Has difficulty lying flat due to dyspnea             --chemo/immunotherapy on hold             --Will be starting daily XRT starting Monday.  9. Urinary retention?:  Has been having difficulty voiding since July. CT showed thickened bladder             --will check UA/UCS to rule out cystitis             --monitor voiding with bladder scan/PVR checks.  10. Septic olecranon bursitis: On Duricef 1000 mg bid for 2-3 weeks --Will need follow up with ID on 11/28 for input to finalize course of treatment.  -local care 11. Leukocytosis: Felt to be steroid effect.  12. Hyponatremia: Na continues to fluctuate from 120 at  admission-->126-->124 13. Anemia/Thrombocytopenia: Felt to be due to chemo. Monitor for signs of bleeding.  --Recheck CBC in am.  15. Candida esophagitis: On diflucan. Will schedule MMW qid as this was effective at home.       Bary Leriche, PA-C 12/04/2021   I have personally performed a face to face diagnostic evaluation of this patient and formulated the key components of the plan.  Additionally, I have personally reviewed laboratory data, imaging studies, as well as relevant notes and concur with the physician assistant's documentation above.  The patient's status has not changed from the original H&P.  Any changes in documentation from the acute care chart have been noted above.  [name], MD, [facility]

## 2021-12-04 NOTE — Discharge Summary (Signed)
Physician Discharge Summary  Christopher Randall CXK:[id number] DOB: 1961/06/30 DOA: 11/27/2021  PCP: Fredirick Lathe, PA-C  Admit date: 11/27/2021 Discharge date: 12/05/2021  Admitted From: Home Discharge disposition: CIR   Code Status: Full Code   Discharge Diagnosis:   Principal Problem:   Septic olecranon bursitis of left elbow Active Problems:   Anxiety, takes Ativan prn, mostly at night   Lung cancer metastatic to brain [facility])   Counseling regarding advance care planning and goals of care   Embolic stroke ([place])   Hyponatremia   Thrombocytopenia ([place])   Leukocytosis   Anemia   Acute respiratory failure (HCC)   Multifocal pneumonia   Sepsis ([place])   Cellulitis   Confusion   STEMI (ST elevation myocardial infarction) ([place])   Acute deep vein thrombosis (DVT) of proximal vein of both lower extremities ([place])   Chief complaint: Dyspnea, pedal edema  Brief narrative: Christopher Randall is a 60 y.o. male with lung cancer with mets to brain admitted October 2022, follows up with Dr. Irene Limbo as an outpatient and received 1 session of radiotherapy and chemotherapy on 12/2. Per patient's wife, patient had weakness prior to chemotherapy as well but it significantly got worse after chemotherapy. Patient presented to the ED on 12/9 with complaint of progressively worsening shortness of breath, weeping bilateral lower extremity edema, increased sleepiness, progressive fatigue, worsening confusion, word finding difficulties.  MRI in the ED showed numerous small foci of acute to subacute infarcts involving multiple vascular territories suggestive of a central embolic source. Chest x-ray showed bilateral airspace disease with small right pleural effusion, concerning for multifocal pneumonia. EKG on admission showed inferolateral ST elevation MI. He was initially admitted to ICU transferred out to hospitalist service on 12/13.  Subjective: Patient was seen and examined this morning.  Was  waiting for radiation simulation.  No new symptoms.  Discussed with wife and son at bedside.  Assessment/Plan: Bilateral stroke -Multifocal acute to subacute infarct noted in MRI after an admission -Stroke work-up completed.  Neurology consult appreciated. -Imaging findings consistent with embolic shower possibly in the setting of hypercoagulable state due to advanced malignancy.   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  Carotid Dopplers were unremarkable.   -Ultrasound duplex scan of lower extremities showed bilateral DVT but echocardiogram did not show any PFO.  It showed EF of 55%. -Neurology recommended anticoagulation  Bilateral DVT of right and left gastrocnemius muscles -Probably related to underlying hypercoagulable state related to malignancy -Initially treated with IV heparin.  Discussed with oncologist Dr. Irene Limbo on 12/15.  Heparin was switched to oral Eliquis.  Inferolateral ST elevation MI Hyperlipidemia -EKG on admission showed ST elevation in anterolateral leads.  Cardiology was consulted.  Echocardiogram did not show wall motion abnormality.  Patient was deemed not a candidate for invasive angiography due to thrombocytopenia as well as advanced malignancy.  Per previous note, cardiology recommended Plavix but it was not initiated because of potential need of orthopedic surgery on 12/15.  Since the patient did not require surgery.  We will add Plavix to his regimen.  Of note he is already on Eliquis as well.  -Continue metoprolol, statin  Left elbow septic bursitis -Orthopedic surgery was consulted.  Fluid aspirated from the left elbow on 12/12 with preliminary cultures growing gram-positive cocci. -ID and orthopedics consulted.  Patient was planned for washout in the OR today.  However on reevaluation by orthopedics today, he was noted to be improving with antibiotics and hence did  not need surgical debridement.  -Per ID recommendation, patient is now on  cefadroxil 1 g twice daily.  ID recommends 2 to 3 weeks of antibiotic total given immunosuppression, lack of I&D.  If no improvement with antibiotics, may still need I&D.  Patient will follow up with ID doctor, on 12/28 at 2:30 PM.    Acute respiratory failure with hypoxia Multifocal pneumonia -CT scan of chest showed interstitial and groundglass opacities in the upper greater than lower lobes consistent with edema/pneumonia combination or lymphangitic carcinomatosis.   -Patient was kept on broad-spectrum antibiotics for a week. -Continue oxygen supplementation.  Wean down as tolerated.  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  -CT chest on 12/14 raise possibility of lymphangitic carcinomatosis.  Case was discussed with oncology and radiation oncology.  Today, patient got simulation study done for radiation treatment.  To follow-up with oncology as an outpatient for chemotherapy.  Thrombocytopenia Leukemoid reaction -likely in the setting of malignancy/infection.   -Received 2 units of platelet transfusion on 12/10.  Platelet low but roughly stable.  Need to monitor for bleeding in the setting of thrombocytopenia and use of Plavix and Eliquis. Recent Labs  Lab 11/28/21 1016 11/28/21 1447 12/01/21 0207 12/02/21 0452 12/03/21 0331 12/04/21 0234 12/05/21 0506  WBC 43.9*   < > 41.6* 40.8* 40.5* 40.2* 36.2*  NEUTROABS 33.7*  --   --  34.1*  --   --  28.9*  HGB 10.8*   < > 9.8* 10.2* 10.2* 10.7* 10.5*  HCT 30.2*   < > 28.8* 29.6* 29.5* 30.3* 30.2*  MCV 88.3   < > 90.0 90.5 90.8 89.9 90.7  PLT 87*   < > 84* 89* 90* 70* 46*   < > = values in this interval not displayed.   Bilateral lower extremity edema -1+ bilateral pedal edema in the setting of bilateral DVT, normal EF and SIADH -Expected to improve with Lasix and mobilization.  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    -Started on Lasix 40 mg daily on 12/15.  Continue same at discharge. -Repeat BMP next 2 to 3 days.  May need nephrology involvement if sodium level is not improving. Recent Labs  Lab 11/30/21 0406 12/01/21 0717 12/02/21 0452 12/03/21 0331 12/04/21 0234 12/05/21 0506  NA 122* 124* 126* 126* 124* 126*   Mobility: Encourage ambulation Living condition: Was living at home Goals of care:   Code Status: Full Code  Nutritional status: Body mass index is 29.66 kg/m.       Discharge Medications:   Allergies as of 12/04/2021       Reactions   Iopamidol Hives, Itching   Isovue break through with prep, Hives and Itching Patient had PET?CT scan with 125 mls of Isovue-300. Noted to have two raised itchy hives which self-resolved. Patient had PET/CT scan with contrast on 05/27/2014 while on prednisone prep (3 doses of 50 mg-13 hour prep) and noted to have two hives which self -resolved without treatment   Iodinated Diagnostic Agents Rash   Iodine-131 Rash        Medication List     STOP taking these medications    Diclofenac Sodium 1.5 % Soln   oseltamivir 75 MG capsule Commonly known as: TAMIFLU       TAKE these medications    apixaban 5 MG Tabs tablet Commonly known as: ELIQUIS Take 1 tablet (5 mg total)  by mouth 2 (two) times daily.   atorvastatin 40 MG tablet Commonly known as: LIPITOR Take 1 tablet (40 mg total) by mouth daily.   cefadroxil 500 MG capsule Commonly known as: DURICEF Take 2 capsules (1,000 mg total) by mouth 2 (two) times daily.   clonazePAM 0.5 MG tablet Commonly known as: KLONOPIN Take 1 tablet (0.5 mg total) by mouth 2 (two) times daily as needed (anxiety).   clopidogrel 75 MG tablet Commonly known as: PLAVIX Take 1 tablet (75 mg total) by mouth daily.   dexamethasone 1 MG tablet Commonly known as: DECADRON Take 1 tablet (1 mg total) by mouth daily with breakfast.   fluconazole 100 MG tablet Commonly known as: DIFLUCAN Take 2  tablets today, then 1 tablet daily x 20 more days. What changed:  how much to take how to take this when to take this additional instructions   folic acid 1 MG tablet Commonly known as: FOLVITE Take 1 tablet (1 mg total) by mouth daily. Start 7 days before pemetrexed chemotherapy. Continue until 21 days after pemetrexed completed.   furosemide 40 MG tablet Commonly known as: LASIX Take 1 tablet (40 mg total) by mouth daily. What changed:  medication strength how much to take   guaiFENesin 600 MG 12 hr tablet Commonly known as: MUCINEX Take 2 tablets (1,200 mg total) by mouth 2 (two) times daily.   HYDROcodone-acetaminophen 5-325 MG tablet Commonly known as: NORCO/VICODIN Take 1-2 tablets by mouth every 4 (four) hours as needed for moderate pain.   ICAPS AREDS 2 PO Take 1 capsule by mouth daily.   ipratropium-albuterol 0.5-2.5 (3) MG/3ML Soln Commonly known as: DUONEB Take 3 mLs by nebulization every 6 (six) hours as needed.   lidocaine-prilocaine cream Commonly known as: EMLA Apply to affected area once   magic mouthwash (lidocaine, diphenhydrAMINE, alum & mag hydroxide) suspension Swish and spit 5 mls by mouth every 4-6 hours as needed What changed:  how much to take how to take this when to take this reasons to take this   megestrol 40 MG tablet Commonly known as: MEGACE Take 4 tablets (160 mg total) by mouth daily.   metoprolol tartrate 25 MG tablet Commonly known as: LOPRESSOR Take 0.5 tablets (12.5 mg total) by mouth 2 (two) times daily.   mupirocin ointment 2 % Commonly known as: BACTROBAN Place 1 application into the nose 2 (two) times daily.   nicotine polacrilex 4 MG lozenge Commonly known as: COMMIT Take 4 mg by mouth 2 (two) times daily as needed for smoking cessation.   omeprazole 20 MG capsule Commonly known as: PRILOSEC Take daily while on steroids. What changed:  how much to take when to take this additional instructions    ondansetron 8 MG tablet Commonly known as: Zofran Take 1 tablet (8 mg total) by mouth 2 (two) times daily as needed (Nausea or vomiting). Start if needed on the third day after cisplatin. What changed:  reasons to take this additional instructions   polyethylene glycol 17 g packet Commonly known as: MIRALAX / GLYCOLAX Take 17 g by mouth daily as needed for moderate constipation.   prochlorperazine 10 MG tablet Commonly known as: COMPAZINE Take 1 tablet (10 mg total) by mouth every 6 (six) hours as needed (Nausea or vomiting). What changed: reasons to take this   silver sulfADIAZINE 1 % cream Commonly known as: SILVADENE Apply topically 2 (two) times daily.   sodium chloride 0.65 % Soln nasal spray Commonly known as: OCEAN Place 1  spray into both nostrils daily as needed for congestion.   Visine 0.025-0.3 % ophthalmic solution Generic drug: naphazoline-pheniramine 1 drop 4 (four) times daily as needed for eye irritation.   vitamin B-12 1000 MCG tablet Commonly known as: CYANOCOBALAMIN Take 1,000 mcg by mouth daily.               Discharge Care Instructions  (From admission, onward)           Start     Ordered   12/04/21 0000  Discharge wound care:        12/04/21 1144            Wound care:   Wound / Incision (Open or Dehisced) 12/01/21 Pretibial Distal;Left;Right;Lower;Posterior bilateral leg wounds with open areas and sloughing from DVTs (Active)  Date First Assessed: 12/01/21   Location: Pretibial  Location Orientation: Distal;Left;Right;Lower;Posterior  Wound Description (Comments): bilateral leg wounds with open areas and sloughing from DVTs  Present on Admission: Yes    Assessments 12/01/2021  8:45 AM 12/04/2021  9:00 PM  Dressing Type Abdominal pads;Silver dressings Gauze (Comment)  Dressing Changed Changed Reinforced  Dressing Status Old drainage --  Dressing Change Frequency Daily Daily  Site / Wound Assessment Bleeding;Black;Pale;Pink;Red  --  Peri-wound Assessment Intact;Black;Erythema (non-blanchable);Pink;Maceration --  Margins Unattached edges (unapproximated) --  Closure None --  Drainage Amount Scant --  Drainage Description Serosanguineous --  Treatment Cleansed Other (Comment)     No Linked orders to display     Wound / Incision (Open or Dehisced) 12/04/21 Other (Comment) Elbow Left;Posterior Hardened and inflamed area on the left elbow (Active)  Date First Assessed/Time First Assessed: 12/04/21 1710   Wound Type: (c) Other (Comment)  Location: Elbow  Location Orientation: Left;Posterior  Wound Description (Comments): Hardened and inflamed area on the left elbow  Present on Admission: Yes    Assessments 12/04/2021  5:10 PM 12/04/2021  9:00 PM  Dressing Type None None  Site / Wound Assessment Red;Pink Red;Pink  Peri-wound Assessment Intact --  Margins Attached edges (approximated) --  Treatment -- Other (Comment)     No Linked orders to display     Pressure Injury 12/04/21 Arm Lower;Posterior;Proximal;Right Stage 1 -  Intact skin with non-blanchable redness of a localized area usually over a bony prominence. reddness to the right elbow (Active)  Date First Assessed/Time First Assessed: 12/04/21 1710   Location: Arm  Location Orientation: Lower;Posterior;Proximal;Right  Staging: Stage 1 -  Intact skin with non-blanchable redness of a localized area usually over a bony prominence.  Wound Descri...    Assessments 12/04/2021  5:10 PM 12/04/2021  9:00 PM  Dressing Type None None  Site / Wound Assessment Pink;Red Pink;Red  Peri-wound Assessment Intact --  Margins Attached edges (approximated) --  Treatment -- Other (Comment)     No Linked orders to display    Discharge Instructions:   Discharge Instructions     Ambulatory referral to Neurology   Complete by: As directed    Follow up with stroke clinic NP (Jessica Vanschaick or Cecille Rubin, if both not available, consider Zachery Dauer, or A[place]) at [facility] in  about 4 weeks. Thanks.   Call MD for:  difficulty breathing, headache or visual disturbances   Complete by: As directed    Call MD for:  extreme fatigue   Complete by: As directed    Call MD for:  hives   Complete by: As directed    Call MD for:  persistant dizziness or light-headedness  Complete by: As directed    Call MD for:  persistant nausea and vomiting   Complete by: As directed    Call MD for:  severe uncontrolled pain   Complete by: As directed    Call MD for:  temperature >100.4   Complete by: As directed    Diet general   Complete by: As directed    Discharge instructions   Complete by: As directed    General discharge instructions:  Follow with Primary MD Allwardt, Randa Evens, PA-C in 7 days   Get CBC/BMP checked in next visit within 1 week by PCP or SNF MD. (We routinely change or add medications that can affect your baseline labs and fluid status, therefore we recommend that you get the mentioned basic workup next visit with your PCP, your PCP may decide not to get them or add new tests based on their clinical decision)  On your next visit with your PCP, please get your medicines reviewed and adjusted.  Please request your PCP  to go over all hospital tests, procedures, radiology results at the follow up, please get all Hospital records sent to your PCP by signing hospital release before you go home.  Activity: As tolerated with Full fall precautions use walker/cane & assistance as needed  Avoid using any recreational substances like cigarette, tobacco, alcohol, or non-prescribed drug.  If you experience worsening of your admission symptoms, develop shortness of breath, life threatening emergency, suicidal or homicidal thoughts you must seek medical attention immediately by calling 911 or calling your MD immediately  if symptoms less severe.  You must read complete instructions/literature along with all the possible adverse reactions/side effects for all the medicines  you take and that have been prescribed to you. Take any new medicine only after you have completely understood and accepted all the possible adverse reactions/side effects.   Do not drive, operate heavy machinery, perform activities at heights, swimming or participation in water activities or provide baby sitting services if your were admitted for syncope or siezures until you have seen by Primary MD or a Neurologist and advised to do so again.  Do not drive when taking Pain medications.  Do not take more than prescribed Pain, Sleep and Anxiety Medications  Wear Seat belts while driving.  Please note You were cared for by a hospitalist during your hospital stay. If you have any questions about your discharge medications or the care you received while you were in the hospital after you are discharged, you can call the unit and asked to speak with the hospitalist on call if the hospitalist that took care of you is not available. Once you are discharged, your primary care physician will handle any further medical issues. Please note that NO REFILLS for any discharge medications will be authorized once you are discharged, as it is imperative that you return to your primary care physician (or establish a relationship with a primary care physician if you do not have one) for your aftercare needs so that they can reassess your need for medications and monitor your lab values.   Discharge wound care:   Complete by: As directed    Increase activity slowly   Complete by: As directed        Follow ups:    Follow-up Information     [facility]. Schedule an appointment as soon as possible for a visit in 1 month(s).   Specialty: Neurology Why: stroke clinic Contact information: [address] [place]        Erle Crocker, MD Follow up.   Specialty: Orthopedic Surgery Why: As needed Contact information: [place] [address] [phone]         Thayer Headings, MD. Go on 12/16/2021.   Specialty: Infectious Diseases Why: at 3:30pm Contact information: [address] [address] [phone]                 Discharge Exam:   Vitals:   12/03/21 2353 12/04/21 0430 12/04/21 0819 12/04/21 1126  BP: 110/76 (!) 112/101 (!) 123/93 110/86  Pulse: (!) 107 (!) 107 (!) 118 (!) 115  Resp: 16 18 14 18   Temp: (!) 97.4 F (36.3 C) 97.6 F (36.4 C)  98.1 F (36.7 C)  TempSrc: Oral Axillary  Oral  SpO2: 97% 97% 96% 97%  Weight:  91.1 kg    Height:        Body mass index is 29.66 kg/m.  General exam: Pleasant middle-aged Caucasian male.  Looks older for his age. Skin: No rashes, lesions or ulcers. HEENT: Atraumatic, normocephalic, no obvious bleeding Lungs: Air entry in both bases improving today.  Coughs on deep breathing.   CVS: Mild tachycardia, regular rhythm, no murmur GI/Abd soft, nontender, nondistended, bowel sound present CNS: Alert, awake, oriented x3 Psychiatry: Sad affect Extremities: No pedal edema, no calf tenderness  Time coordinating discharge: 35 minutes   The results of significant diagnostics from this hospitalization (including imaging, microbiology, ancillary and laboratory) are listed below for reference.    Procedures and Diagnostic Studies:   No results found.   Labs:   Basic Metabolic Panel: Recent Labs  Lab 11/30/21 0406 12/01/21 0717 12/02/21 0452 12/03/21 0331 12/04/21 0234 12/05/21 0506  NA 122* 124* 126* 126* 124* 126*  K 3.7 3.0* 3.5 3.7 3.5 3.8  CL 88* 87* 90* 90* 88* 88*  CO2 23 26 27 24 24 27   GLUCOSE 97 131* 97 93 102* 113*  BUN 17 17 14 12 20 20   CREATININE 0.68 0.79 0.72 0.78 0.92 0.91  CALCIUM 7.8* 7.8* 7.9* 8.0* 7.9* 8.0*  MG 1.8  --  1.8  --   --   --   PHOS 2.8  --   --   --   --   --    GFR Estimated Creatinine Clearance: 97.5 mL/min (by C-G formula based on SCr of 0.91 mg/dL). Liver  Function Tests: Recent Labs  Lab 12/02/21 0452 12/05/21 0506  AST 45* 34  ALT 49* 35  ALKPHOS 105 87  BILITOT 0.4 0.6  PROT 4.8* 4.8*  ALBUMIN 1.8* 2.0*   No results for input(s): LIPASE, AMYLASE in the last 168 hours. No results for input(s): AMMONIA in the last 168 hours.  Coagulation profile No results for input(s): INR, PROTIME in the last 168 hours.   CBC: Recent Labs  Lab 11/28/21 1016 11/28/21 1447 12/01/21 0207 12/02/21 0452 12/03/21 0331 12/04/21 0234 12/05/21 0506  WBC 43.9*   < > 41.6* 40.8* 40.5* 40.2* 36.2*  NEUTROABS 33.7*  --   --  34.1*  --   --  28.9*  HGB 10.8*   < > 9.8* 10.2* 10.2* 10.7* 10.5*  HCT 30.2*   < > 28.8* 29.6* 29.5* 30.3* 30.2*  MCV 88.3   < > 90.0 90.5 90.8 89.9 90.7  PLT 87*   < > 84* 89* 90* 70* 46*   < > = values in  this interval not displayed.   Cardiac Enzymes: No results for input(s): CKTOTAL, CKMB, CKMBINDEX, TROPONINI in the last 168 hours. BNP: Invalid input(s): POCBNP CBG: No results for input(s): GLUCAP in the last 168 hours. D-Dimer No results for input(s): DDIMER in the last 72 hours. Hgb A1c No results for input(s): HGBA1C in the last 72 hours. Lipid Profile No results for input(s): CHOL, HDL, LDLCALC, TRIG, CHOLHDL, LDLDIRECT in the last 72 hours. Thyroid function studies No results for input(s): TSH, T4TOTAL, T3FREE, THYROIDAB in the last 72 hours.  Invalid input(s): FREET3 Anemia work up No results for input(s): VITAMINB12, FOLATE, FERRITIN, TIBC, IRON, RETICCTPCT in the last 72 hours. Microbiology Recent Results (from the past 240 hour(s))  Culture, blood (Routine x 2)     Status: None   Collection Time: 11/27/21  5:35 PM   Specimen: BLOOD LEFT ARM  Result Value Ref Range Status   Specimen Description BLOOD LEFT ARM  Final   Special Requests   Final    BOTTLES DRAWN AEROBIC AND ANAEROBIC Blood Culture adequate volume   Culture   Final    NO GROWTH 5 DAYS Performed at [facility], [address] [place], [place] [address]    Report Status 12/02/2021 FINAL  Final  Culture, blood (Routine x 2)     Status: None   Collection Time: 11/27/21 10:47 PM   Specimen: BLOOD  Result Value Ref Range Status   Specimen Description BLOOD LEFT ANTECUBITAL  Final   Special Requests   Final    BOTTLES DRAWN AEROBIC AND ANAEROBIC Blood Culture adequate volume   Culture   Final    NO GROWTH 5 DAYS Performed at [facility], [place] [address], [place], [place] [address]    Report Status 12/02/2021 FINAL  Final  MRSA Next Gen by PCR, Nasal     Status: None   Collection Time: 11/28/21  4:17 AM   Specimen: Nasal Mucosa; Nasal Swab  Result Value Ref Range Status   MRSA by PCR Next Gen NOT DETECTED NOT DETECTED Final    Comment: (NOTE) The GeneXpert MRSA Assay (FDA approved for NASAL specimens only), is one component of a comprehensive MRSA colonization surveillance program. It is not intended to diagnose MRSA infection nor to guide or monitor treatment for MRSA infections. Test performance is not FDA approved in patients less than 37 years old. Performed at [facility], [place] [address], [place], [place] [address]   Body fluid culture w Gram Stain     Status: None   Collection Time: 11/30/21 10:51 AM   Specimen: Body Fluid  Result Value Ref Range Status   Specimen Description FLUID  Final   Special Requests LEFT ELBOW  Final   Gram Stain   Final    ABUNDANT WBC PRESENT,BOTH PMN AND MONONUCLEAR ABUNDANT GRAM POSITIVE COCCI Performed at [facility], [address], [place], [place] [address]    Culture ABUNDANT STAPHYLOCOCCUS AUREUS  Final   Report Status 12/02/2021 FINAL  Final   Organism ID, Bacteria STAPHYLOCOCCUS AUREUS  Final      Susceptibility   Staphylococcus aureus - MIC*    CIPROFLOXACIN <=0.5 SENSITIVE Sensitive     ERYTHROMYCIN <=0.25 SENSITIVE Sensitive     GENTAMICIN <=0.5 SENSITIVE Sensitive     OXACILLIN <=0.25 SENSITIVE Sensitive     TETRACYCLINE <=1  SENSITIVE Sensitive     VANCOMYCIN 1 SENSITIVE Sensitive     TRIMETH/SULFA <=10 SENSITIVE Sensitive  CLINDAMYCIN <=0.25 SENSITIVE Sensitive     RIFAMPIN <=0.5 SENSITIVE Sensitive     Inducible Clindamycin NEGATIVE Sensitive     * ABUNDANT STAPHYLOCOCCUS AUREUS  Surgical PCR screen     Status: Abnormal   Collection Time: 12/02/21  8:50 PM   Specimen: Nasal Mucosa; Nasal Swab  Result Value Ref Range Status   MRSA, PCR NEGATIVE NEGATIVE Final   Staphylococcus aureus POSITIVE (A) NEGATIVE Final    Comment: (NOTE) The Xpert SA Assay (FDA approved for NASAL specimens in patients 40 years of age and older), is one component of a comprehensive surveillance program. It is not intended to diagnose infection nor to guide or monitor treatment. Performed at [facility], [place] [address] [place], [place] [address]      Signed: Terrilee Croak  [facility] 12/05/2021, 7:15 AM

## 2021-12-05 ENCOUNTER — Other Ambulatory Visit: Payer: Self-pay

## 2021-12-05 DIAGNOSIS — L8990 Pressure ulcer of unspecified site, unspecified stage: Secondary | ICD-10-CM | POA: Insufficient documentation

## 2021-12-05 LAB — COMPREHENSIVE METABOLIC PANEL
ALT: 35 U/L (ref 0–44)
AST: 34 U/L (ref 15–41)
Albumin: 2 g/dL — ABNORMAL LOW (ref 3.5–5.0)
Alkaline Phosphatase: 87 U/L (ref 38–126)
Anion gap: 11 (ref 5–15)
BUN: 20 mg/dL (ref 6–20)
CO2: 27 mmol/L (ref 22–32)
Calcium: 8 mg/dL — ABNORMAL LOW (ref 8.9–10.3)
Chloride: 88 mmol/L — ABNORMAL LOW (ref 98–111)
Creatinine, Ser: 0.91 mg/dL (ref 0.61–1.24)
GFR, Estimated: >60 mL/min (ref >60)
Glucose, Bld: 113 mg/dL — ABNORMAL HIGH (ref 70–99)
Potassium: 3.8 mmol/L (ref 3.5–5.1)
Sodium: 126 mmol/L — ABNORMAL LOW (ref 135–145)
Total Bilirubin: 0.6 mg/dL (ref 0.3–1.2)
Total Protein: 4.8 g/dL — ABNORMAL LOW (ref 6.5–8.1)

## 2021-12-05 LAB — URINALYSIS, ROUTINE W REFLEX MICROSCOPIC
Bilirubin Urine: NEGATIVE
Glucose, UA: NEGATIVE mg/dL
Hgb urine dipstick: NEGATIVE
Ketones, ur: NEGATIVE mg/dL
Leukocytes,Ua: NEGATIVE
Nitrite: NEGATIVE
Protein, ur: NEGATIVE mg/dL
Specific Gravity, Urine: 1.025 (ref 1.005–1.030)
pH: 5.5 (ref 5.0–8.0)

## 2021-12-05 LAB — CBC WITH DIFFERENTIAL/PLATELET
Abs Immature Granulocytes: 3.36 10*3/uL — ABNORMAL HIGH (ref 0.00–0.07)
Basophils Absolute: 0.1 10*3/uL (ref 0.0–0.1)
Basophils Relative: 0 %
Eosinophils Absolute: 0 10*3/uL (ref 0.0–0.5)
Eosinophils Relative: 0 %
HCT: 30.2 % — ABNORMAL LOW (ref 39.0–52.0)
Hemoglobin: 10.5 g/dL — ABNORMAL LOW (ref 13.0–17.0)
Immature Granulocytes: 9 %
Lymphocytes Relative: 6 %
Lymphs Abs: 2.2 10*3/uL (ref 0.7–4.0)
MCH: 31.5 pg (ref 26.0–34.0)
MCHC: 34.8 g/dL (ref 30.0–36.0)
MCV: 90.7 fL (ref 80.0–100.0)
Monocytes Absolute: 1.6 10*3/uL — ABNORMAL HIGH (ref 0.1–1.0)
Monocytes Relative: 4 %
Neutro Abs: 28.9 10*3/uL — ABNORMAL HIGH (ref 1.7–7.7)
Neutrophils Relative %: 81 %
Platelets: 46 10*3/uL — ABNORMAL LOW (ref 150–400)
RBC: 3.33 MIL/uL — ABNORMAL LOW (ref 4.22–5.81)
RDW: 15.8 % — ABNORMAL HIGH (ref 11.5–15.5)
Smear Review: DECREASED
WBC: 36.2 10*3/uL — ABNORMAL HIGH (ref 4.0–10.5)
nRBC: 0.1 % (ref 0.0–0.2)

## 2021-12-05 MED ORDER — ENSURE ENLIVE PO LIQD
237.0000 mL | Freq: Two times a day (BID) | ORAL | Status: DC
  Administered 2021-12-05 – 2021-12-10 (×11): 237 mL via ORAL

## 2021-12-05 NOTE — Plan of Care (Signed)
Problem: RH Balance Goal: LTG Patient will maintain dynamic standing with ADLs (OT) Description: LTG:  Patient will maintain dynamic standing balance with assist during activities of daily living (OT)  Flowsheets (Taken 12/05/2021 1213) LTG: Pt will maintain dynamic standing balance during ADLs with: Supervision/Verbal cueing   Problem: Sit to Stand Goal: LTG:  Patient will perform sit to stand in prep for activites of daily living with assistance level (OT) Description: LTG:  Patient will perform sit to stand in prep for activites of daily living with assistance level (OT) Flowsheets (Taken 12/05/2021 1213) LTG: PT will perform sit to stand in prep for activites of daily living with assistance level: Independent   Problem: RH Eating Goal: LTG Patient will perform eating w/assist, cues/equip (OT) Description: LTG: Patient will perform eating with assist, with/without cues using equipment (OT) Flowsheets (Taken 12/05/2021 1213) LTG: Pt will perform eating with assistance level of: Independent with assistive device    Problem: RH Grooming Goal: LTG Patient will perform grooming w/assist,cues/equip (OT) Description: LTG: Patient will perform grooming with assist, with/without cues using equipment (OT) Flowsheets (Taken 12/05/2021 1213) LTG: Pt will perform grooming with assistance level of: Independent   Problem: RH Bathing Goal: LTG Patient will bathe all body parts with assist levels (OT) Description: LTG: Patient will bathe all body parts with assist levels (OT) Flowsheets (Taken 12/05/2021 1213) LTG: Pt will perform bathing with assistance level/cueing: Independent with assistive device    Problem: RH Dressing Goal: LTG Patient will perform upper body dressing (OT) Description: LTG Patient will perform upper body dressing with assist, with/without cues (OT). Flowsheets (Taken 12/05/2021 1213) LTG: Pt will perform upper body dressing with assistance level of: Independent Goal:  LTG Patient will perform lower body dressing w/assist (OT) Description: LTG: Patient will perform lower body dressing with assist, with/without cues in positioning using equipment (OT) Flowsheets (Taken 12/05/2021 1213) LTG: Pt will perform lower body dressing with assistance level of: Independent   Problem: RH Toileting Goal: LTG Patient will perform toileting task (3/3 steps) with assistance level (OT) Description: LTG: Patient will perform toileting task (3/3 steps) with assistance level (OT)  Flowsheets (Taken 12/05/2021 1213) LTG: Pt will perform toileting task (3/3 steps) with assistance level: Independent with assistive device   Problem: RH Simple Meal Prep Goal: LTG Patient will perform simple meal prep w/assist (OT) Description: LTG: Patient will perform simple meal prep with assistance, with/without cues (OT). Flowsheets (Taken 12/05/2021 1213) LTG: Pt will perform simple meal prep with assistance level of: Supervision/Verbal cueing   Problem: RH Light Housekeeping Goal: LTG Patient will perform light housekeeping w/assist (OT) Description: LTG: Patient will perform light housekeeping with assistance, with/without cues (OT). Flowsheets (Taken 12/05/2021 1213) LTG: Pt will perform light housekeeping with assistance level of: Supervision/Verbal cueing   Problem: RH Toilet Transfers Goal: LTG Patient will perform toilet transfers w/assist (OT) Description: LTG: Patient will perform toilet transfers with assist, with/without cues using equipment (OT) Flowsheets (Taken 12/05/2021 1213) LTG: Pt will perform toilet transfers with assistance level of: Independent   Problem: RH Tub/Shower Transfers Goal: LTG Patient will perform tub/shower transfers w/assist (OT) Description: LTG: Patient will perform tub/shower transfers with assist, with/without cues using equipment (OT) Flowsheets (Taken 12/05/2021 1213) LTG: Pt will perform tub/shower stall transfers with assistance level of:  Supervision/Verbal cueing   Problem: RH Furniture Transfers Goal: LTG Patient will perform furniture transfers w/assist (OT/PT) Description: LTG: Patient will perform furniture transfers  with assistance (OT/PT). Flowsheets (Taken 12/05/2021 1213) LTG: Pt will perform furniture  transfers with assist:: Supervision/Verbal cueing

## 2021-12-05 NOTE — Evaluation (Signed)
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 [facility]) Active Problems:   Cerebral infarction due to embolism of cerebral artery ([place])   Protein-calorie malnutrition, severe ([place])   Reactive depression   Pressure injury of skin  Past Medical History:  Past Medical History:  Diagnosis Date   Abnormal TSH 09/28/2017   Anemia 11/27/2021   Anxiety 07/03/2012   Depression    Difficulty sleeping 09/28/2017   Examination of participant in clinical trial 09/11/2012   History of immunotherapy 02/23/2016   Melanoma of skin 10/26/2011   Nicotine dependence 07/17/2015   Smoker    STEMI (ST elevation myocardial infarction) ([place]) 11/27/2021   Past Surgical History:  Past Surgical History:  Procedure Laterality Date   BRONCHIAL BIOPSY  10/09/2021   Procedure: BRONCHIAL BIOPSIES;  Surgeon: [name], MD;  Location: [facility] END[facility];  Service: Endoscopy;;   BRONCHIAL BRUSHINGS  10/09/2021   Procedure: BRONCHIAL BRUSHINGS;  Surgeon: [name], MD;  Location: [facility];  Service: Endoscopy;;   ENDOBRONCHIAL ULTRASOUND N/A 10/09/2021   Procedure: ENDOBRONCHIAL ULTRASOUND;  Surgeon: [name], MD;  Location: [facility];  Service: Endoscopy;  Laterality: N/A;   FINE NEEDLE ASPIRATION  10/09/2021   Procedure: FINE NEEDLE ASPIRATION (FNA) LINEAR;  Surgeon: [name], MD;  Location: [facility];  Service: Endoscopy;;   LEFT POPLITEAL MASS RESECTION  08/27/2011   pigmented villonodular synovitis, negative for malignancy   LYMPH GLAND EXCISION  07/12/2011   right axilla, consistent with melanoma   LYMPH NODE DISSECTION  08/27/2011   right,  0/17 nodes positive   VIDEO BRONCHOSCOPY N/A 10/09/2021   Procedure: VIDEO BRONCHOSCOPY WITHOUT FLUORO;  Surgeon: [name], MD;  Location: [facility];  Service: Endoscopy;  Laterality: N/A;    Assessment / Plan / Recommendation Clinical Impression  Patient is a 60 y.o. year old male with history of melanoma, anxiety/depression, diagnosis of NSCLC with mets to the brain 10/09/21, reports of hallucinations w/headaches since thanksgiving and recent initiation of chemo/immunotherapy/XRT. He was unable to proceed with full dose chemo due to thrombocytopenia.  He was admitted on 11/27/21 from Hem/onc office with edema BLE with cellulitis and drainage as well as 3-4 days of increased confusion since XRT. MRI brain done revealing numerous small foci of acute to subacute infarcts involving multiple vascular territories suggestive for central embolic source.  MRA brain showed moderate stenosis right ACA and loss of flow signal related to distal right-VA. BLE Doppler showed acute DVT right and left gastrocnemius vein. Chest x-ray done showing bilateral airspace disease with small right effusion concerning for multifocal pneumonia and he was started on IV antibiotics for PNA and cellulitis. Dr. Audie Box  was consulted for input due to STEMI and felt that patient with stress-induced cardiomyopathy. CT chest done 12/14 showing worsening of extensive mediastinal and hilar adenopathy with enlargement of supraclavicular and abdominal retroperitoneal nodes, enlarging RUL mass collapsing RML and effacing proximal bronchi, cardiomegaly with increased pericardial effusion and interstitial and groundglass opacities consistent with pneumonia and /or lymphangitic carcinomatosis. Patient transferred to CIR on 12/04/2021.   Pt presents with mild-moderate cognitive impairment as evidenced by SLUMS score of 18/30 (score of 15/30 on 12/10), pt and wife endorse cognitive  improvements in recent week. Although assessment score  wasn't greatly improved from initial evaluation in acute care, pt demonstrates improvements in following multistep commands, simple recall and sustained attention. Pt able to recall 2/4 story elements and 1/5 words with delay. Pt with some difficulty with recall of recent medical events and timeline, defers to wife when unable to remember. Pt with inconsistent awareness of cognitive impairments during assessment, responds well to cues. It is also noted patient answers impulsively at times, cues to slow down before responding increased accuracy with tasks. Pt with 1 episode of anomia during evaluation, wife reports this happens occasionally during the day. Pt motivated to continue med management, money management, cooking and other high level cognitive tasks, ST recommends 24/7 supervision at home. Pt left in recliner with alarm belt placed on, wife present for needs. Pt will benefit from skilled ST during CIR to increase safety and independence with daily routine at home.    Skilled Therapeutic Interventions          Pt participating in re-administration of [place] Status Exam (SLUMS) as well as further non-standardized assessments of cognitive linguistic function. Please see above.   SLP Assessment  Patient will need skilled Speech Lanaguage Pathology Services during CIR admission    Recommendations  Medication Administration: Whole meds with puree Oral Care Recommendations: Oral care BID Recommendations for Other Services: Neuropsych consult Patient destination: Home Follow up Recommendations: Home Health SLP;Outpatient SLP Equipment Recommended: None recommended by SLP    SLP Frequency 3 to 5 out of 7 days   SLP Duration  SLP Intensity  SLP Treatment/Interventions 5-7 days  Minumum of 1-2 x/day, 30 to 90 minutes  Cognitive remediation/compensation;Environmental controls;Internal/external aids;Therapeutic Exercise;Therapeutic Activities;Patient/family education;Functional tasks     Pain Pain Assessment Pain Scale: 0-10 Pain Score: 0-No pain  Prior Functioning Cognitive/Linguistic Baseline: Within functional limits Type of Home: House  Lives With: Spouse Available Help at Discharge: Other (Comment) (wife taking FMLA through [employer]) Vocation: Retired  SLP Evaluation Cognition Overall Cognitive Status: Impaired/Different from baseline Arousal/Alertness: Awake/alert Orientation Level: Oriented X4 Year: 2023 Month: December Day of Week: Correct Attention: Sustained Sustained Attention: Impaired Sustained Attention Impairment: Verbal basic Memory: Impaired Memory Impairment: Storage deficit;Decreased recall of new information Awareness: Impaired Awareness Impairment: Emergent impairment;Anticipatory impairment Problem Solving: Impaired Problem Solving Impairment: Verbal basic;Verbal complex;Functional complex Executive Function: Museum/gallery conservator: Impaired Decision Making: Impaired Behaviors: Impulsive (with answers at times) Safety/Judgment: Impaired  Comprehension Auditory Comprehension Overall Auditory Comprehension: Appears within functional limits for tasks assessed Expression Expression Primary Mode of Expression: Verbal Verbal Expression Overall Verbal Expression: Appears within functional limits for tasks assessed Written Expression Dominant Hand: Right Oral Motor Oral Motor/Sensory Function Overall Oral Motor/Sensory Function: Within functional limits Motor Speech Overall Motor Speech: Appears within functional limits for tasks assessed  Care Tool Care Tool Cognition Ability to hear (with hearing aid or hearing appliances if normally used Ability to hear (with hearing aid or hearing appliances if normally used): 1. Minimal difficulty - difficulty in some environments (e.g. when person speaks softly or setting is noisy)   Expression of Ideas and Wants Expression of Ideas and Wants: 3. Some difficulty -  exhibits some difficulty with expressing needs and ideas (e.g, some words or finishing thoughts) or speech is not clear   Understanding Verbal and Non-Verbal Content Understanding Verbal and Non-Verbal Content: 3. Usually understands - understands most conversations, but misses some part/intent of message. Requires cues at times to understand  Memory/Recall Ability Memory/Recall Ability : Current  season;That he or she is in a hospital/hospital unit;Staff names and faces   Short Term Goals: Week 1: SLP Short Term Goal 1 (Week 1): STG = LTG d/t ELOS  Refer to Care Plan for Long Term Goals  Recommendations for other services: Neuropsych  Discharge Criteria: Patient will be discharged from SLP if patient refuses treatment 3 consecutive times without medical reason, if treatment goals not met, if there is a change in medical status, if patient makes no progress towards goals or if patient is discharged from hospital.  The above assessment, treatment plan, treatment alternatives and goals were discussed and mutually agreed upon: by patient and by family  Dewaine Conger 12/05/2021, 11:41 AM

## 2021-12-05 NOTE — Progress Notes (Signed)
PROGRESS NOTE   Subjective/Complaints:  No issues overnite , reviewed labs Discussed rehab with pt and wife  OT eval in progress Discussed cog issues with SLP   ROS- neg CP, SOB, N/V/D   Objective:   No results found. Recent Labs    12/04/21 0234 12/05/21 0506  WBC 40.2* 36.2*  HGB 10.7* 10.5*  HCT 30.3* 30.2*  PLT 70* 46*   Recent Labs    12/04/21 0234 12/05/21 0506  NA 124* 126*  K 3.5 3.8  CL 88* 88*  CO2 24 27  GLUCOSE 102* 113*  BUN 20 20  CREATININE 0.92 0.91  CALCIUM 7.9* 8.0*    Intake/Output Summary (Last 24 hours) at 12/05/2021 0906 Last data filed at 12/05/2021 0744 Gross per 24 hour  Intake 240 ml  Output 413 ml  Net -173 ml     Pressure Injury 12/04/21 Arm Lower;Posterior;Proximal;Right Stage 1 -  Intact skin with non-blanchable redness of a localized area usually over a bony prominence. reddness to the right elbow (Active)  12/04/21 1710  Location: Arm  Location Orientation: Lower;Posterior;Proximal;Right  Staging: Stage 1 -  Intact skin with non-blanchable redness of a localized area usually over a bony prominence.  Wound Description (Comments): reddness to the right elbow  Present on Admission: Yes    Physical Exam: Vital Signs Blood pressure 105/81, pulse (!) 108, temperature 97.7 F (36.5 C), temperature source Oral, resp. rate 18, weight 90 kg, SpO2 99 %.  General: No acute distress Mood and affect are appropriate Heart: Regular rate and rhythm no rubs murmurs or extra sounds Lungs: Clear to auscultation, breathing unlabored, no rales or wheezes Abdomen: Positive bowel sounds, soft nontender to palpation, nondistended Extremities: No clubbing, cyanosis, or edema Skin: ACE wrap bilateral ant shin  Neurologic: Cranial nerves II through XII intact, motor strength is 5/5 in bilateral deltoid, bicep, tricep, grip, hip flexor, knee extensors, ankle dorsiflexor and plantar  flexor Sensory exam normal sensation to light touch  in bilateral upper and lower extremities Cerebellar exam normal finger to nose to finger as well as heel to shin in bilateral upper and lower extremities Musculoskeletal: Full range of motion in all 4 extremities. No joint swelling    Assessment/Plan: 1. Functional deficits which require 3+ hours per day of interdisciplinary therapy in a comprehensive inpatient rehab setting. Physiatrist is providing close team supervision and 24 hour management of active medical problems listed below. Physiatrist and rehab team continue to assess barriers to discharge/monitor patient progress toward functional and medical goals  Care Tool:  Bathing              Bathing assist       Upper Body Dressing/Undressing Upper body dressing        Upper body assist      Lower Body Dressing/Undressing Lower body dressing            Lower body assist       Toileting Toileting    Toileting assist       Transfers Chair/bed transfer  Transfers assist     Chair/bed transfer assist level: Contact Guard/Touching assist     Locomotion Ambulation   Ambulation assist  Walk 10 feet activity   Assist           Walk 50 feet activity   Assist           Walk 150 feet activity   Assist           Walk 10 feet on uneven surface  activity   Assist           Wheelchair     Assist               Wheelchair 50 feet with 2 turns activity    Assist            Wheelchair 150 feet activity     Assist          Blood pressure 105/81, pulse (!) 108, temperature 97.7 F (36.5 C), temperature source Oral, resp. rate 18, weight 90 kg, SpO2 99 %.  Medical Problem List and Plan: 1. Functional deficits secondary to multiple, bi-cerebral embolic CVA's likely due to hypercoagulable state             -patient may shower with feet covered             -ELOS/Goals: 10-12 days,  supervision to min assist with PT,OT and supervision with SLP             -will have to adjust therapy schedule to accommodate for midday XRT 2.  Antithrombotics: -DVT/anticoagulation:  Pharmaceutical: Other (comment)--Eliquis             -antiplatelet therapy: N/A 3. Pain Management: Tylenol 4. Mood: pt struggling with diagnosis and end of life thoughts -would like for him to speak with neuropsychology while he's here -palliative care also would be appropriate.  -remeron trial (discussed with pt/wife)             -antipsychotic agents: N/A 5. Neuropsych: This patient is not fully capable of making decisions on his own behalf. 6. Skin/Wound Care:  Routine pressure relief measures             --Monitor elbow/BLE for healing                          -add warm moist compress to elbow bid, otherwise wrap with kerlex             -might benefit from a darco-type shoe for gait given his heel wounds 7. Fluids/Electrolytes/Severe protein deficient malutrition: Monitor I/O. Check CMET in am.              -add protein supplement             -add remeron for mood/appetite as opposed to megace given his hypercoagulable state 8. Metastatic lung cancer: Now on oxygen--using 3-4 liters. Has difficulty lying flat due to dyspnea             --chemo/immunotherapy on hold             --Will be starting daily XRT starting Monday.  9. Urinary retention?:  Has been having difficulty voiding since July. CT showed thickened bladder             --will check UA/UCS to rule out cystitis             --monitor voiding with bladder scan/PVR checks.  10. Septic olecranon bursitis: On Duricef 1000 mg bid for 2-3 weeks --Will need follow up with ID on 11/28 for input to finalize course of treatment.  -  local care 11. Leukocytosis: Felt to be steroid effect.  12. Hyponatremia: Na continues to fluctuate from 120 at admission-->126-->124, urine Na 46 on 12/12 13. Anemia/Thrombocytopenia: Felt to be due to chemo. Monitor for  signs of bleeding.  -daily CBC 15. Candida esophagitis: On diflucan. Will schedule MMW qid as this was effective at home.      LOS: 1 days A FACE TO FACE EVALUATION WAS PERFORMED  Charlett Blake 12/05/2021, 9:06 AM

## 2021-12-05 NOTE — Evaluation (Signed)
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 [facility]) Active Problems:   Cerebral infarction due to embolism of cerebral artery ([place])   Protein-calorie malnutrition, severe ([place])   Reactive depression   Pressure injury of skin   Past Medical History:  Past Medical History:  Diagnosis Date   Abnormal TSH 09/28/2017   Anemia 11/27/2021   Anxiety 07/03/2012   Depression    Difficulty sleeping 09/28/2017   Examination of participant in clinical trial 09/11/2012   History of immunotherapy 02/23/2016   Melanoma of skin 10/26/2011   Nicotine dependence 07/17/2015   Smoker    STEMI (ST elevation myocardial infarction) ([place]) 11/27/2021   Past Surgical History:  Past Surgical History:  Procedure Laterality Date   BRONCHIAL BIOPSY  10/09/2021   Procedure: BRONCHIAL BIOPSIES;  Surgeon: [name], MD;  Location: [facility];  Service: Endoscopy;;   BRONCHIAL BRUSHINGS  10/09/2021   Procedure: BRONCHIAL BRUSHINGS;  Surgeon: [name], MD;  Location: [facility];  Service: Endoscopy;;   ENDOBRONCHIAL ULTRASOUND N/A 10/09/2021   Procedure: ENDOBRONCHIAL ULTRASOUND;  Surgeon: [name], MD;  Location: [facility];  Service: Endoscopy;  Laterality: N/A;   FINE NEEDLE ASPIRATION  10/09/2021   Procedure: FINE NEEDLE ASPIRATION (FNA) LINEAR;  Surgeon: [name], MD;  Location: [facility];  Service: Endoscopy;;   LEFT POPLITEAL MASS  RESECTION  08/27/2011   pigmented villonodular synovitis, negative for malignancy   LYMPH GLAND EXCISION  07/12/2011   right axilla, consistent with melanoma   LYMPH NODE DISSECTION  08/27/2011   right, 0/17 nodes positive   VIDEO BRONCHOSCOPY N/A 10/09/2021   Procedure: VIDEO BRONCHOSCOPY WITHOUT FLUORO;  Surgeon: [name], MD;  Location: [facility];  Service: Endoscopy;  Laterality: N/A;    Assessment & Plan Clinical Impression: Patient is a 60 y.o. year old male with history of melanoma, anxiety/depression, diagnosis of NSCLC with mets to the brain10/21/22, reports of hallucinations w/headaches since thanksgiving and recent initiation of chemo/immunotherapy/XRT. He was unable to proceed with full dose chemo due to thrombocytopenia.  He was admitted on 11/27/21 from Hem/onc office with edema BLE with cellulitis and drainage as well as 3-4 days of increased confusion since XRT. MRI brain done revealing numerous small foci of acute to subacute infarcts involving multiple vascular territories suggestive for central embolic source.  MRA brain showed moderate stenosis right ACA and loss of flow signal related to distal right-VA. BLE Doppler showed acute DVT right and left gastrocnemius vein.  Patient noted to have significant hyponatremia at admission with sodium 119, abnormal LFTs, leukocytosis with white count 67K and thrombocytopenia with platelets at 29K and chest x-ray done showing bilateral airspace disease with small right effusion concerning for multifocal pneumonia and he was started on IV antibiotics for PNA and cellulitis.  He also reported chest pain with elevated cardiac enzyme and was started on IV heparin.  2D echo showed EF 55 to 60% with hypokinesis of apex.  He was transfused with platelets and started on low-dose ASA and IV heparin.  Dr.  O'Neal  was consulted for input due to STEMI and felt that patient with stress-induced cardiomyopathy, pt was not a candidate for invasive  angiopathy and recommended medical management with plavix and BB.     Dr. Marin Olp was consulted for input and felt that low sodium could be from dehydration and treatment.  Dr. Erlinda Hong felt that stroke emboli right PE due to hypercoagulable state from advanced malignancy.  He reported left elbow pain with swelling on 12/12 which was concerning for septic bursitis.  Elbow was aspirated and sent for culture with question of formal I&D.  Cultures were positive for abundant staph aureus and CT chest done 12/14 showing worsening of extensive mediastinal and hilar adenopathy with enlargement of supraclavicular and abdominal retroperitoneal nodes, enlarging RUL mass collapsing RML and effacing proximal bronchi, cardiomegaly with increased pericardial effusion and interstitial and groundglass opacities consistent with pneumonia and /or lymphangitic carcinomatosis. Antibiotics narrowed to Duricef x 2- 3 weeks  starting 12/15 per input from Dr. Juleen China and to follow up with ID in 2 weeks.  As patient had improvement in pain with decrease in edema after aspiration,  formal I&D was needed per Dr. Lucia Gaskins.  He was transitioned to Eliquis on 12/15    Patient transferred to CIR on 12/04/2021 .    Patient currently requires  CGA  with basic self-care skills secondary to muscle weakness, decreased cardiorespiratoy endurance, decreased coordination, "floaters" in vision, decreased awareness, decreased problem solving, decreased safety awareness, decreased memory, and delayed processing, and decreased standing balance, decreased postural control, and decreased balance strategies.  Prior to hospitalization, patient could complete BADL/IADL/mobility with independent .  Patient will benefit from skilled intervention to decrease level of assist with basic self-care skills, increase independence with basic self-care skills, and increase level of independence with iADL prior to discharge home with care partner.  Anticipate patient will  require intermittent supervision and follow up outpatient.  OT - End of Session Activity Tolerance: Tolerates 10 - 20 min activity with multiple rests Endurance Deficit: Yes Endurance Deficit Description: O2 at 4LPM, decreases to 91% w/ activity, recovers in 1-2' OT Assessment Rehab Potential (ACUTE ONLY): Good OT Barriers to Discharge: New oxygen OT Patient demonstrates impairments in the following area(s): Balance;Cognition;Endurance;Pain;Safety;Motor;Vision;Sensory OT Basic ADL's Functional Problem(s): Grooming;Dressing;Bathing;Toileting OT Advanced ADL's Functional Problem(s): Simple Meal Preparation;Light Housekeeping OT Transfers Functional Problem(s): Toilet;Tub/Shower OT Additional Impairment(s): None OT Plan OT Intensity: Minimum of 1-2 x/day, 45 to 90 minutes OT Frequency: 5 out of 7 days OT Duration/Estimated Length of Stay: 5 - 7 days OT Treatment/Interventions: Balance/vestibular training;Disease mangement/prevention;Neuromuscular re-education;Self Care/advanced ADL retraining;Therapeutic Exercise;Wheelchair propulsion/positioning;UE/LE Strength taining/ROM;Skin care/wound managment;Pain management;DME/adaptive equipment instruction;Cognitive remediation/compensation;Community reintegration;Functional electrical stimulation;Patient/family education;UE/LE Coordination activities;Visual/perceptual remediation/compensation;Therapeutic Activities;Psychosocial support;Functional mobility training;Discharge planning OT Self Feeding Anticipated Outcome(s): mod I OT Basic Self-Care Anticipated Outcome(s): ind OT Toileting Anticipated Outcome(s): ind OT Bathroom Transfers Anticipated Outcome(s): S OT Recommendation Recommendations for Other Services: Therapeutic Recreation consult Therapeutic Recreation Interventions: Pet therapy;Kitchen group;Stress management;Outing/community reintergration Patient destination: Home Follow Up Recommendations: Outpatient OT Equipment Recommended: To  be determined   OT Evaluation Precautions/Restrictions  Precautions Precautions: Fall Precaution Comments: L elbow septic bursitis, B heel wounds, monitor SpO2, decreased cognition Restrictions Weight Bearing Restrictions: No General Chart Reviewed: Yes Family/Caregiver Present: Yes (wife)  Pain Pain Assessment Pain Scale: 0-10 Pain Score: 7  Pain Type: Acute pain Pain Location: Ankle (at Achilles tendon area w/ dressing.) Pain Orientation: Right;Left Pain Descriptors / Indicators: Sharp Pain Onset: On-going Home Living/Prior Functioning Home Living Available Help at Discharge: Family  Type of Home: House (town home) Home Access: Level entry Home Layout: Two level, Able to live on main level with bedroom/bathroom Bathroom Shower/Tub: Multimedia programmer: Handicapped height Bathroom Accessibility: Yes Additional Comments: wife has taken FMLA to be with him. 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 [employer] and Hobbies: Likes to mountain bike, walks 5 mi/ day, goes to gym 5 days/wk Prior Function Level of Independence: Independent with transfers, Independent with gait, Independent with basic ADLs, Independent with homemaking with ambulation  Able to Take Stairs?: Yes Driving: Yes Vocation: Retired Surveyor, mining Baseline Vision/History: 1 Wears glasses (bifocals) Ability to See in Adequate Light: 0 Adequate Patient Visual Report: Other (comment) (reports intermittent "floaters" in vision) Vision Assessment?: Yes Eye Alignment: Within Functional Limits Ocular Range of Motion: Within Functional Limits Tracking/Visual Pursuits: Able to track stimulus in all quads without difficulty Saccades: Decreased speed of saccadic movement Convergence: Within functional limits Visual Fields: No  apparent deficits Perception  Perception: Within Functional Limits Praxis Praxis: Impaired Praxis Impairment Details: Initiation;Motor planning Cognition Overall Cognitive Status: Impaired/Different from baseline Arousal/Alertness: Awake/alert Orientation Level: Person;Place;Situation Person: Oriented Place: Oriented Situation: Oriented Year: 2022 Month: January Day of Week: Correct Memory: Impaired Memory Impairment: Decreased recall of new information Immediate Memory Recall: Sock;Blue;Bed Memory Recall Sock: Without Cue Memory Recall Blue: Without Cue Memory Recall Bed: Without Cue Attention: Sustained Sustained Attention: Impaired Sustained Attention Impairment: Verbal basic Awareness: Impaired Awareness Impairment: Emergent impairment;Anticipatory impairment Problem Solving: Impaired Problem Solving Impairment: Verbal basic;Verbal complex;Functional complex Executive Function: Museum/gallery conservator: Impaired Decision Making: Impaired Behaviors: Impulsive;Other (comment) Safety/Judgment: Impaired Comments: Requires increased time and multimodal cuing to sequence safe walk-in shower tranfers Sensation Sensation Light Touch: Appears Intact Hot/Cold: Not tested Proprioception: Appears Intact Stereognosis: Appears Intact Coordination Gross Motor Movements are Fluid and Coordinated: No Fine Motor Movements are Fluid and Coordinated: Yes (grossly WFL, mild L dysmetria) Coordination and Movement Description: limited by decreased dynamic standing balance and generalized deconditioning/fatigue Finger Nose Finger Test: mily L dysmetria on L Heel Shin Test: NT 2/2 heel/ankle pain from wounds. Motor  Motor Motor: Motor impersistence;Other (comment) Motor - Skilled Clinical Observations: flat-footed gait; limited by decreased dynamic standing balance and generalized deconditioning/fatigue  Trunk/Postural Assessment  Cervical Assessment Cervical Assessment:  Within Functional Limits Thoracic Assessment Thoracic Assessment: Exceptions to [facility] (mild rounded shoulders) Lumbar Assessment Lumbar Assessment: Within Functional Limits Postural Control Postural Control: Deficits on evaluation (intermittent furniture walking) Righting Reactions: decreased arm swing  Balance Balance Balance Assessed: Yes Static Sitting Balance Static Sitting - Balance Support: Feet supported Static Sitting - Level of Assistance: 7: Independent Dynamic Sitting Balance Dynamic Sitting - Balance Support: Feet supported Dynamic Sitting - Level of Assistance: 5: Stand by assistance Static Standing Balance Static Standing - Balance Support: No upper extremity supported;During functional activity Static Standing - Level of Assistance: 5: Stand by assistance Dynamic Standing Balance Dynamic Standing - Balance Support: No upper extremity supported;During functional activity Dynamic Standing - Level of Assistance: 5: Stand by assistance Extremity/Trunk Assessment RUE Assessment RUE Assessment: Within Functional Limits LUE Assessment LUE Assessment: Within Functional Limits General Strength Comments: mild L dysmetria/weakness but grossly WFL for ADL assesesd; 4/5 in shoulder flexion  Care Tool Care Tool Self Care Eating   Eating Assist Level: Set up assist    Oral Care    Oral Care Assist Level: Contact Guard/Toucning  assist (stance)    Bathing   Body parts bathed by patient: Face;Right arm;Left arm;Chest;Abdomen;Front perineal area;Buttocks;Right upper leg;Left upper leg;Right lower leg;Left lower leg     Assist Level: Contact Guard/Touching assist (stance)    Upper Body Dressing(including orthotics)   What is the patient wearing?: Pull over shirt   Assist Level: Supervision/Verbal cueing    Lower Body Dressing (excluding footwear)   What is the patient wearing?: Pants Assist for lower body dressing: Contact Guard/Touching assist    Putting on/Taking off  footwear   What is the patient wearing?: Non-skid slipper socks Assist for footwear: Minimal Assistance - Patient > 75%       Care Tool Toileting Toileting activity   Assist for toileting: Contact Guard/Touching assist     Care Tool Bed Mobility Roll left and right activity   Roll left and right assist level: Contact Guard/Touching assist    Sit to lying activity   Sit to lying assist level: Contact Guard/Touching assist    Lying to sitting on side of bed activity   Lying to sitting on side of bed assist level: the ability to move from lying on the back to sitting on the side of the bed with no back support.: Contact Guard/Touching assist     Care Tool Transfers Sit to stand transfer   Sit to stand assist level: Contact Guard/Touching assist    Chair/bed transfer   Chair/bed transfer assist level: Contact Guard/Touching assist     Toilet transfer   Assist Level: Contact Guard/Touching assist     Care Tool Cognition  Expression of Ideas and Wants Expression of Ideas and Wants: 3. Some difficulty - exhibits some difficulty with expressing needs and ideas (e.g, some words or finishing thoughts) or speech is not clear  Understanding Verbal and Non-Verbal Content Understanding Verbal and Non-Verbal Content: 3. Usually understands - understands most conversations, but misses some part/intent of message. Requires cues at times to understand   Memory/Recall Ability Memory/Recall Ability : Current season;That he or she is in a hospital/hospital unit;Staff names and faces   Refer to Care Plan for [place] [facility] OT Short Term Goal 1 (Week 1): STG = LTG 2/2 ELOS  Recommendations for other services: None    Skilled Therapeutic Intervention ADL ADL Eating: Set up Where Assessed-Eating: Chair Grooming: Supervision/safety Where Assessed-Grooming: Standing at sink Upper Body Bathing: Supervision/safety Where Assessed-Upper Body Bathing: Sitting at  sink Lower Body Bathing: Contact guard Where Assessed-Lower Body Bathing: Standing at sink Upper Body Dressing: Supervision/safety Where Assessed-Upper Body Dressing: Chair Lower Body Dressing: Contact guard Where Assessed-Lower Body Dressing: Chair Toileting: Contact guard Where Assessed-Toileting: Glass blower/designer: Therapist, music Method: Counselling psychologist: Bedside commode;Grab bars Tub/Shower Transfer: Metallurgist Method: Optometrist: Facilities manager: Curator Method: Heritage manager: Manufacturing systems engineer  Bed Mobility Bed Mobility: Not assessed Transfers Sit to Stand: Contact Guard/Touching assist Stand to Sit: Contact Guard/Touching assist  Session Note: Pt received seated in recliner with LPN/wife present, no c/o pain and agreeable to OT eval.  Reviewed role of CIR OT, evaluation process, ADL/func mobility retraining, goals for therapy, and safety plan. Evaluation completed as documented above. Completed ambulatory toilet transfer with RW and close S and use of L grab bar/ 3in1. Completed walk in shower transfer via side-stepping after 2 visual demonstrations with CGA, pt perseverative on completing and required cues  for safety/technique/cease activity. Ambulated > recliner with CGA and no AD. Changed shirt with set-up A and rest break halfway in between. Reports significant fatigue post activity. MD in/out morning assessment. Changed shorts with CGA and increased time, as well. Pt reports would most like to get "back to the basics" in rehab. SLP then present for following SLP eval..  SatO2 at 91 - 100% throughout session on 4L via [place]. Pt left seated in recliner with wife and SLP present, call bell in reach, and all immediate needs met.   Discharge Criteria: Patient will be discharged from OT if patient refuses  treatment 3 consecutive times without medical reason, if treatment goals not met, if there is a change in medical status, if patient makes no progress towards goals or if patient is discharged from hospital.  The above assessment, treatment plan, treatment alternatives and goals were discussed and mutually agreed upon: by patient and by family  Volanda Napoleon MS, OTR/L  12/05/2021, 12:46 PM

## 2021-12-05 NOTE — Plan of Care (Signed)
°  Problem: RH Problem Solving Goal: LTG Patient will demonstrate problem solving for (SLP) Description: LTG:  Patient will demonstrate problem solving for basic/complex daily situations with cues  (SLP) Flowsheets (Taken 12/05/2021 1008) LTG: Patient will demonstrate problem solving for (SLP): Basic daily situations LTG Patient will demonstrate problem solving for: Supervision   Problem: RH Memory Goal: LTG Patient will use memory compensatory aids to (SLP) Description: LTG:  Patient will use memory compensatory aids to recall biographical/new, daily complex information with cues (SLP) Flowsheets (Taken 12/05/2021 1008) LTG: Patient will use memory compensatory aids to (SLP): Supervision   Problem: RH Attention Goal: LTG Patient will demonstrate this level of attention during functional activites (SLP) Description: LTG:  Patient will will demonstrate this level of attention during functional activites (SLP) Flowsheets (Taken 12/05/2021 1008) Patient will demonstrate during cognitive/linguistic activities the attention type of: Sustained Patient will demonstrate this level of attention during cognitive/linguistic activities in: Home LTG: Patient will demonstrate this level of attention during cognitive/linguistic activities with assistance of (SLP): Supervision   Problem: RH Awareness Goal: LTG: Patient will demonstrate awareness during functional activites type of (SLP) Description: LTG: Patient will demonstrate awareness during functional activites type of (SLP) Flowsheets (Taken 12/05/2021 1008) Patient will demonstrate during cognitive/linguistic activities awareness type of: Emergent LTG: Patient will demonstrate awareness during cognitive/linguistic activities with assistance of (SLP): Supervision

## 2021-12-05 NOTE — Progress Notes (Signed)
Initial Nutrition Assessment  DOCUMENTATION CODES:   Not applicable  INTERVENTION:   Liberalize diet to REGULAR  Ensure Enlive po BID, each supplement provides 350 kcal and 20 grams of protein  Constipation: recommend scheduling bowel regimen until pt with BM  Continue 30 ml ProSource Plus BID, each supplement provides 100 kcals and 15 grams protein.    NUTRITION DIAGNOSIS:   Increased nutrient needs related to cancer and cancer related treatments as evidenced by estimated needs.  GOAL:   Patient will meet greater than or equal to 90% of their needs   MONITOR:   PO intake, Supplement acceptance, Labs, Weight trends  REASON FOR ASSESSMENT:   Malnutrition Screening Tool    ASSESSMENT:   60 yo male admitted to [facility] with functional deficits post multiple CVAs.  Pt with dx of NSCLC with mets to brain on 09/2021.  Chemo on hold; noted plan to start XRT daily  Pt ate 0% at dinner last night, 75% at breakfast. Unable to reach pt via phone  Pt with candida esophagitis, on diflucan as well as magic mouthwash  No recent BM; last documented BM 12/11. Noted order for enema prn as well as miralax and dulcolax suppository prn. Recommend possibly scheduling bowel regimen until pt with successful BM  Labs: sodium 126 (L) Meds: decadron, diflucan, magic mouthwash, , B-12  NUTRITION - FOCUSED PHYSICAL EXAM:  Unable to assess  Diet Order:   Diet Order             Diet Heart Room service appropriate? Yes; Fluid consistency: Thin  Diet effective now                   EDUCATION NEEDS:   Not appropriate for education at this time  Skin:  Skin Assessment: Skin Integrity Issues: Skin Integrity Issues:: Other (Comment) Other: b/l leg wounds with open areas from DVT  Last BM:  12/11  Height:   Ht Readings from Last 1 Encounters:  11/28/21 5\' 9"  (1.753 m)    Weight:   Wt Readings from Last 1 Encounters:  12/05/21 90 kg    BMI:  Body mass  index is 29.3 kg/m.  Estimated Nutritional Needs:   Kcal:  1093-2355 kcals  Protein:  135-160 g  Fluid:  >/= 2L   Kerman Passey MS, RDN, LDN, CNSC Registered Dietitian III Clinical Nutrition RD Pager and On-Call Pager Number Located in [place]

## 2021-12-05 NOTE — Evaluation (Signed)
Physical Therapy Assessment and Plan  Patient Details  Name: Christopher Randall MRN: [id number] Date of Birth: 11/01/1961  PT Diagnosis: Abnormality of gait, Cognitive deficits, Difficulty walking, Impaired cognition, and Muscle weakness Rehab Potential: Good ELOS: 5-7 days.   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)   Protein-calorie malnutrition, severe ([place])   Reactive depression   Pressure injury of skin   Past Medical History:  Past Medical History:  Diagnosis Date   Abnormal TSH 09/28/2017   Anemia 11/27/2021   Anxiety 07/03/2012   Depression    Difficulty sleeping 09/28/2017   Examination of participant in clinical trial 09/11/2012   History of immunotherapy 02/23/2016   Melanoma of skin 10/26/2011   Nicotine dependence 07/17/2015   Smoker    STEMI (ST elevation myocardial infarction) ([place]) 11/27/2021   Past Surgical History:  Past Surgical History:  Procedure Laterality Date   BRONCHIAL BIOPSY  10/09/2021   Procedure: BRONCHIAL BIOPSIES;  Surgeon: [name], MD;  Location: [facility];  Service: Endoscopy;;   BRONCHIAL BRUSHINGS  10/09/2021   Procedure: BRONCHIAL BRUSHINGS;  Surgeon: [name], MD;  Location: [facility];  Service: Endoscopy;;   ENDOBRONCHIAL ULTRASOUND N/A 10/09/2021   Procedure: ENDOBRONCHIAL ULTRASOUND;  Surgeon: [name], MD;  Location: [facility];  Service: Endoscopy;  Laterality: N/A;   FINE NEEDLE ASPIRATION  10/09/2021   Procedure: FINE NEEDLE ASPIRATION (FNA) LINEAR;  Surgeon: [name], MD;  Location: [facility];  Service: Endoscopy;;   LEFT POPLITEAL MASS RESECTION  08/27/2011   pigmented villonodular synovitis, negative for malignancy   LYMPH GLAND EXCISION  07/12/2011   right  axilla, consistent with melanoma   LYMPH NODE DISSECTION  08/27/2011   right, 0/17 nodes positive   VIDEO BRONCHOSCOPY N/A 10/09/2021   Procedure: VIDEO BRONCHOSCOPY WITHOUT FLUORO;  Surgeon: [name], MD;  Location: [facility];  Service: Endoscopy;  Laterality: N/A;    Assessment & Plan Clinical Impression: [name]. [name] is a 60 year old male with history of melanoma, anxiety/depression, diagnosis of NSCLC with mets to the brain10/21/22, reports of hallucinations w/headaches since thanksgiving and recent initiation of chemo/immunotherapy/XRT. He was unable to proceed with full dose chemo due to thrombocytopenia.  He was admitted on 11/27/21 from Hem/onc office with edema BLE with cellulitis and drainage as well as 3-4 days of increased confusion since XRT. MRI brain done revealing numerous small foci of acute to subacute infarcts involving multiple vascular territories suggestive for central embolic source.  MRA brain showed moderate stenosis right ACA and loss of flow signal related to distal right-VA. BLE Doppler showed acute DVT right and left gastrocnemius vein.  Patient noted to have significant hyponatremia at admission with sodium 119, abnormal LFTs, leukocytosis with white count 67K and thrombocytopenia with platelets at 29K and chest x-ray done showing bilateral airspace disease with small right effusion concerning for multifocal pneumonia and he was started on IV antibiotics for PNA and cellulitis.  He also reported chest pain with elevated cardiac enzyme and was started on IV heparin.  2D echo showed EF 55 to 60% with hypokinesis of apex.  He was transfused with platelets and started on low-dose ASA and IV heparin.  Dr. Audie Box  was consulted for input due to STEMI and felt that patient with stress-induced cardiomyopathy,  pt was not a candidate for invasive angiopathy and recommended medical management with plavix and BB.     Dr. Marin Olp was consulted for input and felt that low  sodium could be from dehydration and treatment.  Dr. Erlinda Hong felt that stroke emboli right PE due to hypercoagulable state from advanced malignancy.  He reported left elbow pain with swelling on 12/12 which was concerning for septic bursitis.  Elbow was aspirated and sent for culture with question of formal I&D.  Cultures were positive for abundant staph aureus and CT chest done 12/14 showing worsening of extensive mediastinal and hilar adenopathy with enlargement of supraclavicular and abdominal retroperitoneal nodes, enlarging RUL mass collapsing RML and effacing proximal bronchi, cardiomegaly with increased pericardial effusion and interstitial and groundglass opacities consistent with pneumonia and /or lymphangitic carcinomatosis. Antibiotics narrowed to Duricef x 2- 3 weeks  starting 12/15 per input from Dr. Juleen China and to follow up with ID in 2 weeks.  As patient had improvement in pain with decrease in edema after aspiration,  formal I&D was needed per Dr. Lucia Gaskins.  He was transitioned to Eliquis on 12/15     Patient currently requires min with mobility secondary to decreased cardiorespiratoy endurance and decreased oxygen support and decreased coordination and decreased motor planning.  Prior to hospitalization, patient was independent  with mobility and lived with Spouse, Daughter in a House (town home) home.  Home access is  Level entry.  Patient will benefit from skilled PT intervention to maximize safe functional mobility, minimize fall risk, and decrease caregiver burden for planned discharge home with 24 hour supervision.  Anticipate patient will benefit from follow up OP at discharge.  PT - End of Session Activity Tolerance: Tolerates 10 - 20 min activity with multiple rests Endurance Deficit: Yes Endurance Deficit Description: O2 at 4LPM, decreases to 88% w/ activity, recovers in 1-2' PT Assessment Rehab Potential (ACUTE/IP ONLY): Good PT Barriers to Discharge: New oxygen;Pending  chemo/radiation PT Patient demonstrates impairments in the following area(s): Balance;Pain;Behavior;Safety;Endurance;Motor PT Transfers Functional Problem(s): Bed Mobility;Bed to Chair;Car;Furniture PT Locomotion Functional Problem(s): Ambulation;Stairs;Wheelchair Mobility PT Plan PT Intensity: Minimum of 1-2 x/day ,45 to 90 minutes PT Frequency: 5 out of 7 days PT Duration Estimated Length of Stay: 5-7 days. PT Treatment/Interventions: Ambulation/gait training;Cognitive remediation/compensation;Discharge planning;DME/adaptive equipment instruction;Functional mobility training;Therapeutic Activities;UE/LE Strength taining/ROM;Balance/vestibular training;Community reintegration;Neuromuscular re-education;Patient/family education;Stair training;Wheelchair propulsion/positioning PT Transfers Anticipated Outcome(s): mod I PT Locomotion Anticipated Outcome(s): supervision w/ LRAD PT Recommendation Recommendations for Other Services: Neuropsych consult Follow Up Recommendations: Outpatient PT Patient destination: Home Equipment Recommended: To be determined Equipment Details: pt has shower seat, qc per spouse, but did not use pre-morbid.   PT Evaluation Precautions/Restrictions Precautions Precautions: Fall Precaution Comments: L elbow septic bursitis, B heel wounds, monitor SpO2, decreased cognition Restrictions Weight Bearing Restrictions: No General Chart Reviewed: Yes Family/Caregiver Present: Yes Vital Signs Pain Pain Assessment Pain Scale: 0-10 Pain Score: 7  Pain Type: Acute pain Pain Location: Ankle (at Achilles tendon area w/ dressing.) Pain Orientation: Right;Left Pain Descriptors / Indicators: Sharp Pain Onset: On-going Pain Interference Pain Interference Pain Effect on Sleep: 1. Rarely or not at all Pain Interference with Therapy Activities: 1. Rarely or not at all Pain Interference with Day-to-Day Activities: 1. Rarely or not at all Home Living/Prior  [place] Available Help at Discharge: Family Type of Home: House (town home) Home Access: Level entry Home Layout: Two level;Able to live on main level with bedroom/bathroom Bathroom Shower/Tub: Walk-in shower  Lives With: Spouse;Daughter Prior Function Level of  Independence: Independent with transfers;Independent with gait  Able to Take Stairs?: Yes Driving: Yes Vocation: Retired Vision/Perception  Vision - History Ability to See in Adequate Light: 0 Adequate  Cognition Overall Cognitive Status: Impaired/Different from baseline Arousal/Alertness: Awake/alert Orientation Level: Oriented X4 Year: 2023 Month: December Day of Week: Correct Attention: Sustained Sustained Attention: Impaired Sustained Attention Impairment: Verbal basic Memory: Impaired Memory Impairment: Decreased recall of new information Awareness: Impaired Awareness Impairment: Emergent impairment;Anticipatory impairment Problem Solving: Impaired Problem Solving Impairment: Verbal basic;Verbal complex;Functional complex Executive Function: Museum/gallery conservator: Impaired Decision Making: Impaired Behaviors: Impulsive;Other (comment) (can be agitated if not given clear direction/instruction.) Safety/Judgment: Impaired Sensation Sensation Light Touch: Appears Intact Coordination Gross Motor Movements are Fluid and Coordinated: No Heel Shin Test: NT 2/2 heel/ankle pain from wounds. Motor  Motor Motor: Motor impersistence Motor - Skilled Clinical Observations: flat-footed gait   Trunk/Postural Assessment  Postural Control Postural Control: Deficits on evaluation Righting Reactions: decreased arm swing  Balance Balance Balance Assessed: Yes Extremity Assessment      RLE Assessment RLE Assessment: Within Functional Limits General Strength Comments: grossly 4/5, not manually resisted 2/2 wounds/bulky dressing. LLE Assessment LLE Assessment: Within Functional  Limits General Strength Comments: grossly 4/5, not manually resisted 2/2 wounds/bulky dressing.  Care Tool Care Tool Bed Mobility Roll left and right activity   Roll left and right assist level: Contact Guard/Touching assist    Sit to lying activity   Sit to lying assist level: Contact Guard/Touching assist    Lying to sitting on side of bed activity   Lying to sitting on side of bed assist level: the ability to move from lying on the back to sitting on the side of the bed with no back support.: Contact Guard/Touching assist     Care Tool Transfers Sit to stand transfer   Sit to stand assist level: Contact Guard/Touching assist    Chair/bed transfer   Chair/bed transfer assist level: Contact Guard/Touching assist     Toilet transfer   Assist Level: Contact Guard/Touching assist    Car transfer   Car transfer assist level: Contact Guard/Touching assist      Care Tool Locomotion Ambulation   Assist level: Contact Guard/Touching assist Assistive device: Walker-rolling (and w/o AD.) Max distance: 70  Walk 10 feet activity   Assist level: Contact Guard/Touching assist Assistive device: No Device;Walker-rolling   Walk 50 feet with 2 turns activity   Assist level: Contact Guard/Touching assist Assistive device: No Device;Walker-rolling  Walk 150 feet activity Walk 150 feet activity did not occur: Safety/medical concerns Assist level: Moderate Assistance - Patient - 50 - 74% Assistive device: No Device  Walk 10 feet on uneven surfaces activity Walk 10 feet on uneven surfaces activity did not occur: Safety/medical concerns      Stairs   Assist level: Contact Guard/Touching assist Stairs assistive device: 2 hand rails Max number of stairs: 4  Walk up/down 1 step activity   Walk up/down 1 step (curb) assist level: Contact Guard/Touching assist Walk up/down 1 step or curb assistive device: 2 hand rails  Walk up/down 4 steps activity   Walk up/down 4 steps assist level:  Contact Guard/Touching assist Walk up/down 4 steps assistive device: 2 hand rails  Walk up/down 12 steps activity Walk up/down 12 steps activity did not occur: Safety/medical concerns      Pick up small objects from floor   Pick up small object from the floor assist level: Minimal Assistance - Patient > 75% Pick up small object from the  floor assistive device: no device.  Wheelchair Is the patient using a wheelchair?: Yes Type of Wheelchair: Manual   Wheelchair assist level: Contact Guard/Touching assist Max wheelchair distance: 40  Wheel 50 feet with 2 turns activity Wheelchair 50 feet with 2 turns activity did not occur: Safety/medical concerns Assist Level: Minimal Assistance - Patient > 75%  Wheel 150 feet activity Wheelchair 150 feet activity did not occur: Safety/medical concerns Assist Level: Maximal Assistance - Patient 25 - 49%    Refer to Care Plan for Long Term Goals  SHORT TERM GOAL WEEK 1 PT Short Term Goal 1 (Week 1): STGs + LTG due to ELOS.  Recommendations for other services: Neuropsych  Skilled Therapeutic Intervention Evaluation completed (see details above and below) with education on PT POC and goals and individual treatment initiated with focus on  endurance, gait, balance, safety.  Pt presents sitting in recliner and agreeable to therapy.  Pt transfers w/ CGA sit to stand, w/ cueing for safety 2/2 impulsiveness.  Pt amb in room w/o AD w/ flat-footed gait to w/c , c/o some pain to posterior calf near dressing.  Pt wheeled to main gym for time conservation.  Pt amb short distance w/o AD and CGA to stairs.  Pt negotiated 4 steps w/ B rails and CGA, step-through gait pattern and educated pt on use of step-to pattern for energy conservation.  O2 sats decreased to 88% on 3 LPM of O2, but returned to > 90 w/in 1 min.  Pt amb w/o AD and CGA up to 70' including turns to return to seat w/ seated rest break required 2/2 fatigue and increased RR.  Pt amb w/ RW and CGA , but  increased verbal cueing for turns to negotiate RW.  Pt performed simulated care transfer to raised mat table (to simulate sedan height) w/ platform on side for door height w/ CGA.  Pt states may be D/Cd w/ sedan or truck, so further practice necessary.  Pt negotiated w/c w/ B Ues x 40' w/ CGA, pt becoming a little agitated.  Pt amb into room from hallway to recliner.  Chair alarm on and all needs in reach, spouse present.    Mobility Bed Mobility Bed Mobility: Not assessed Transfers Transfers: Sit to Stand;Stand to Sit;Stand Pivot Transfers Sit to Stand: Contact Guard/Touching assist Stand to Sit: Contact Guard/Touching assist (does not complete turns for safe transfer stand to sit w/o verbal cueing.) Stand Pivot Transfers: Contact Guard/Touching assist Locomotion  Gait Ambulation: Yes Gait Assistance: Contact Guard/Touching assist Gait Distance (Feet): 75 Feet Assistive device: None Gait Gait: Yes Gait Pattern: Impaired (flat footed gait 2/2 pain?) Gait Pattern: Decreased step length - right;Decreased step length - left;Decreased dorsiflexion - left;Decreased dorsiflexion - right Gait velocity: decreased Stairs / Additional Locomotion Stairs: Yes Stairs Assistance: Contact Guard/Touching assist Stair Management Technique: Two rails Number of Stairs: 4 Height of Stairs: 6 Wheelchair Mobility Wheelchair Mobility: Yes Wheelchair Assistance: Development worker, international aid: Both upper extremities Wheelchair Parts Management: Needs assistance;Supervision/cueing Distance: 40   Discharge Criteria: Patient will be discharged from PT if patient refuses treatment 3 consecutive times without medical reason, if treatment goals not met, if there is a change in medical status, if patient makes no progress towards goals or if patient is discharged from hospital.  The above assessment, treatment plan, treatment alternatives and goals were discussed and mutually agreed  upon: by patient and by family  Ladoris Gene 12/05/2021, 12:34 PM

## 2021-12-06 LAB — CBC
HCT: 31.3 % — ABNORMAL LOW (ref 39.0–52.0)
Hemoglobin: 11.1 g/dL — ABNORMAL LOW (ref 13.0–17.0)
MCH: 31.5 pg (ref 26.0–34.0)
MCHC: 35.5 g/dL (ref 30.0–36.0)
MCV: 88.9 fL (ref 80.0–100.0)
Platelets: 43 10*3/uL — ABNORMAL LOW (ref 150–400)
RBC: 3.52 MIL/uL — ABNORMAL LOW (ref 4.22–5.81)
RDW: 16.1 % — ABNORMAL HIGH (ref 11.5–15.5)
WBC: 37.1 10*3/uL — ABNORMAL HIGH (ref 4.0–10.5)
nRBC: 0.2 % (ref 0.0–0.2)

## 2021-12-06 LAB — URINE CULTURE: Culture: NO GROWTH

## 2021-12-06 MED ORDER — ORAL CARE MOUTH RINSE
15.0000 mL | Freq: Two times a day (BID) | OROMUCOSAL | Status: DC
  Administered 2021-12-06 – 2021-12-10 (×9): 15 mL via OROMUCOSAL

## 2021-12-06 MED ORDER — MIRTAZAPINE 15 MG PO TABS
7.5000 mg | ORAL_TABLET | Freq: Every day | ORAL | Status: DC
  Administered 2021-12-06 – 2021-12-10 (×5): 7.5 mg via ORAL
  Filled 2021-12-06 (×5): qty 1

## 2021-12-06 MED ORDER — CLONAZEPAM 0.25 MG PO TBDP
0.2500 mg | ORAL_TABLET | Freq: Two times a day (BID) | ORAL | Status: DC | PRN
  Administered 2021-12-06 – 2021-12-09 (×3): 0.25 mg via ORAL
  Filled 2021-12-06 (×4): qty 1

## 2021-12-06 NOTE — Progress Notes (Addendum)
Upon initial rounds patient just waking up-appears groggy and restless. Flat affect, A&Ox4. Patient ambulated to bathroom with assist of 2 for safety to void. Patient transferred to chair upon request. Oxygen [place] on at 4L. Patient's son at bedside. Patient restless throughout the night, only sleeping short intervals. Klonopin 0.5mg  given at 0258. Upon reassessment patient asleep in the chair. Vitals stable. Patient drowsy this morning but able to answer orientation questions with extra time and effort to awaken. Rapid response contacted for second opinion regarding patient's cognitive status and alertness. No recommendations received at this time. Continue to monitor

## 2021-12-06 NOTE — IPOC Note (Signed)
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 [facility]) Active Problems:   Cerebral infarction due to embolism of cerebral artery ([place])   Protein-calorie malnutrition, severe ([place])   Reactive depression   Pressure injury of skin     Functional Problem List: Nursing Edema, Endurance, Medication Management, Pain, Nutrition, Safety, Skin Integrity  PT Balance, Pain, Behavior, Safety, Endurance, Motor  OT Balance, Cognition, Endurance, Pain, Safety, Motor, Vision, Sensory  SLP Cognition, Safety  TR         Basic ADLs: OT Grooming, Dressing, Bathing, Toileting     Advanced  ADLs: OT Simple Meal Preparation, Light Housekeeping     Transfers: PT Bed Mobility, Bed to Chair, Musician, Manufacturing systems engineer, Metallurgist: PT Ambulation, Stairs, Emergency planning/management officer     Additional Impairments: OT None  SLP Social Cognition   Problem Solving, Memory, Attention, Awareness  TR      Anticipated Outcomes Item Anticipated Outcome  Self Feeding mod I  Swallowing      Basic self-care  ind  Toileting  ind   Bathroom Transfers S  Bowel/Bladder  n/a  Transfers  mod I  Locomotion  supervision w/ LRAD  Communication     Cognition  Supervision  Pain  < 3  Safety/Judgment  min assist and no falls   Therapy Plan: PT Intensity: Minimum of 1-2 x/day ,45 to 90 minutes PT Frequency: 5 out of 7 days PT Duration Estimated Length of Stay: 5-7 days. OT Intensity: Minimum of 1-2 x/day, 45 to 90 minutes OT Frequency: 5 out of 7 days OT Duration/Estimated Length of Stay: 5 - 7 days SLP Intensity: Minumum of 1-2 x/day, 30 to 90 minutes SLP Frequency: 3 to 5 out of 7 days SLP Duration/Estimated Length of Stay: 5-7 days   Due to the current state of emergency, patients may not be receiving their  3-hours of Medicare-mandated therapy.   Team Interventions: Nursing Interventions Patient/Family Education, Disease Management/Prevention, Pain Management, Medication Management, Skin Care/Wound Management, Discharge Planning, Psychosocial Support  PT interventions Ambulation/gait training, Cognitive remediation/compensation, Discharge planning, DME/adaptive equipment instruction, Functional mobility training, Therapeutic Activities, UE/LE Strength taining/ROM, Training and development officer, Community reintegration, Neuromuscular re-education, Patient/family education, IT trainer, Wheelchair propulsion/positioning  OT Interventions Training and development officer, Disease mangement/prevention, Neuromuscular re-education, Self Care/advanced ADL retraining, Therapeutic Exercise, Wheelchair propulsion/positioning, UE/LE Strength taining/ROM, Skin care/wound managment, Pain management, DME/adaptive equipment instruction, Cognitive remediation/compensation, Community reintegration, Technical sales engineer stimulation, Patient/family education, UE/LE Coordination activities, Visual/perceptual remediation/compensation, Therapeutic Activities, Psychosocial support, Functional mobility training, Discharge planning  SLP Interventions Cognitive remediation/compensation, Environmental controls, Internal/external aids, Therapeutic Exercise, Therapeutic Activities, Patient/family education, Functional tasks  TR Interventions    SW/CM Interventions     Barriers to Discharge MD  Medical stability and Pending chemo/radiation  Nursing Decreased caregiver support, Home environment access/layout, Wound Care, Lack of/limited family support, Weight, Medication compliance, Nutrition means Lives with spouse in 2 level home with level entry. Able to live on main level with bedroom/bathroom. Spouse to take FMLA and additional family will assist at discharge.  PT New oxygen, Pending chemo/radiation    OT New oxygen    SLP  (none  for speech)    SW       Team Discharge Planning: Destination: PT-Home ,OT- Home , SLP-Home Projected Follow-up: PT-Outpatient PT, OT-  Outpatient OT, SLP-Home Health SLP, Outpatient SLP Projected Equipment  Needs: PT-To be determined, OT- To be determined, SLP-None recommended by SLP Equipment Details: PT-pt has shower seat, qc per spouse, but did not use pre-morbid., OT-  Patient/family involved in discharge planning: PT- Patient, Family member/caregiver,  OT-Patient, Family member/caregiver, SLP-Patient, Family member/caregiver  MD ELOS: 10-14d Medical Rehab Prognosis:  Guarded Assessment: 60 year old male with history of melanoma, anxiety/depression, diagnosis of NSCLC with mets to the brain10/21/22, reports of hallucinations w/headaches since thanksgiving and recent initiation of chemo/immunotherapy/XRT. He was unable to proceed with full dose chemo due to thrombocytopenia.  He was admitted on 11/27/21 from Hem/onc office with edema BLE with cellulitis and drainage as well as 3-4 days of increased confusion since XRT. MRI brain done revealing numerous small foci of acute to subacute infarcts involving multiple vascular territories suggestive for central embolic source.  MRA brain showed moderate stenosis right ACA and loss of flow signal related to distal right-VA. BLE Doppler showed acute DVT right and left gastrocnemius vein.  Patient noted to have significant hyponatremia at admission with sodium 119, abnormal LFTs, leukocytosis with white count 67K and thrombocytopenia with platelets at 29K and chest x-ray done showing bilateral airspace disease with small right effusion concerning for multifocal pneumonia and he was started on IV antibiotics for PNA and cellulitis.  He also reported chest pain with elevated cardiac enzyme and was started on IV heparin.  2D echo showed EF 55 to 60% with hypokinesis of apex.  He was transfused with platelets and started on low-dose ASA and IV heparin.  Dr. Audie Box   was consulted for input due to STEMI and felt that patient with stress-induced cardiomyopathy, pt was not a candidate for invasive angiopathy and recommended medical management with plavix and BB.     Dr. Marin Olp was consulted for input and felt that low sodium could be from dehydration and treatment.  Dr. Erlinda Hong felt that stroke emboli right PE due to hypercoagulable state from advanced malignancy.  He reported left elbow pain with swelling on 12/12 which was concerning for septic bursitis.  Elbow was aspirated and sent for culture with question of formal I&D.  Cultures were positive for abundant staph aureus and CT chest done 12/14 showing worsening of extensive mediastinal and hilar adenopathy with enlargement of supraclavicular and abdominal retroperitoneal nodes, enlarging RUL mass collapsing RML and effacing proximal bronchi, cardiomegaly with increased pericardial effusion and interstitial and groundglass opacities consistent with pneumonia and /or lymphangitic carcinomatosis. Antibiotics narrowed to Duricef x 2- 3 weeks  starting 12/15 per input from Dr. Juleen China and to follow up with ID in 2 weeks.  As patient had improvement in pain with decrease in edema after aspiration,  formal I&D was needed per Dr. Lucia Gaskins.  He was transitioned to Eliquis on 12/15       See Team Conference Notes for weekly updates to the plan of care

## 2021-12-06 NOTE — IPOC Note (Signed)
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 [facility]) Active Problems:   Cerebral infarction due to embolism of cerebral artery ([place])   Protein-calorie malnutrition, severe (HCC)   Reactive depression   Pressure injury of skin     Functional Problem List: Nursing Edema, Endurance, Medication Management, Pain, Nutrition, Safety, Skin Integrity  PT Balance, Pain, Behavior, Safety, Endurance, Motor  OT Balance, Cognition, Endurance, Pain, Safety, Motor, Vision, Sensory  SLP Cognition, Safety  TR         Basic ADLs: OT Grooming, Dressing, Bathing, Toileting     Advanced  ADLs: OT Simple Meal Preparation, Light Housekeeping     Transfers: PT Bed Mobility, Bed to Chair, Musician, Manufacturing systems engineer, Metallurgist: PT Ambulation, Data processing manager, Emergency planning/management officer     Additional Impairments: OT None  SLP Social Cognition   Problem Solving, Memory, Attention, Awareness  TR      Anticipated Outcomes Item Anticipated Outcome  Self Feeding mod I  Swallowing      Basic self-care  ind  Toileting  ind   Bathroom Transfers S  Bowel/Bladder  n/a  Transfers  mod I  Locomotion  supervision w/ LRAD  Communication     Cognition  Supervision  Pain  < 3  Safety/Judgment  min assist and no falls   Therapy Plan: PT Intensity: Minimum of 1-2 x/day ,45 to 90 minutes PT Frequency: 5 out of 7 days PT Duration Estimated Length of Stay: 5-7 days. OT Intensity: Minimum of 1-2 x/day, 45 to 90 minutes OT Frequency: 5 out of 7 days OT Duration/Estimated Length of Stay: 5 - 7 days SLP Intensity: Minumum of 1-2 x/day, 30 to 90 minutes SLP Frequency: 3 to 5 out of 7 days SLP Duration/Estimated Length of Stay: 5-7 days   Due to the current state of emergency, patients may not be  receiving their 3-hours of Medicare-mandated therapy.   Team Interventions: Nursing Interventions Patient/Family Education, Disease Management/Prevention, Pain Management, Medication Management, Skin Care/Wound Management, Discharge Planning, Psychosocial Support  PT interventions Ambulation/gait training, Cognitive remediation/compensation, Discharge planning, DME/adaptive equipment instruction, Functional mobility training, Therapeutic Activities, UE/LE Strength taining/ROM, Training and development officer, Community reintegration, Neuromuscular re-education, Patient/family education, IT trainer, Wheelchair propulsion/positioning  OT Interventions Training and development officer, Disease mangement/prevention, Neuromuscular re-education, Self Care/advanced ADL retraining, Therapeutic Exercise, Wheelchair propulsion/positioning, UE/LE Strength taining/ROM, Skin care/wound managment, Pain management, DME/adaptive equipment instruction, Cognitive remediation/compensation, Community reintegration, Technical sales engineer stimulation, Patient/family education, UE/LE Coordination activities, Visual/perceptual remediation/compensation, Therapeutic Activities, Psychosocial support, Functional mobility training, Discharge planning  SLP Interventions Cognitive remediation/compensation, Environmental controls, Internal/external aids, Therapeutic Exercise, Therapeutic Activities, Patient/family education, Functional tasks  TR Interventions    SW/CM Interventions     Barriers to Discharge MD  Medical stability  Nursing Decreased caregiver support, Home environment access/layout, Wound Care, Lack of/limited family support, Weight, Medication compliance, Nutrition means Lives with spouse in 2 level home with level entry. Able to live on main level with bedroom/bathroom. Spouse to take FMLA and additional family will assist at discharge.  PT New oxygen, Pending chemo/radiation    OT New oxygen    SLP  (none for  speech)    SW       Team Discharge Planning: Destination: PT-Home ,OT- Home , SLP-Home Projected Follow-up: PT-Outpatient PT, OT-  Outpatient OT, SLP-Home Health SLP, Outpatient SLP Projected Equipment Needs:  PT-To be determined, OT- To be determined, SLP-None recommended by SLP Equipment Details: PT-pt has shower seat, qc per spouse, but did not use pre-morbid., OT-  Patient/family involved in discharge planning: PT- Patient, Family member/caregiver,  OT-Patient, Family member/caregiver, SLP-Patient, Family member/caregiver  MD ELOS: 5-7 days Medical Rehab Prognosis:  Excellent Assessment: The patient has been admitted for CIR therapies with the diagnosis of embolic CVA's, metastatic lung ca. The team will be addressing functional mobility, strength, stamina, balance, safety, adaptive techniques and equipment, self-care, bowel and bladder mgt, patient and caregiver education, NMR, cognition, community reentry, coping skills. Goals have been set at mod I to supervision with self-care and mobility, supervision with cognition.   Due to the current state of emergency, patients may not be receiving their 3 hours per day of Medicare-mandated therapy.    [name], MD, FAAPMR     See Team Conference Notes for weekly updates to the plan of care

## 2021-12-06 NOTE — Progress Notes (Addendum)
PROGRESS NOTE   Subjective/Complaints:  Spoke to son at bedside who had concerns about pt's fatigue level, noted yesterday PM as well as this am  Discussed meds , labs, multiple medical issues and poor activity tolerance as a result of the above   ROS- neg CP, SOB, N/V/D   Objective:   No results found. Recent Labs    12/05/21 0506 12/06/21 0515  WBC 36.2* 37.1*  HGB 10.5* 11.1*  HCT 30.2* 31.3*  PLT 46* 43*    Recent Labs    12/04/21 0234 12/05/21 0506  NA 124* 126*  K 3.5 3.8  CL 88* 88*  CO2 24 27  GLUCOSE 102* 113*  BUN 20 20  CREATININE 0.92 0.91  CALCIUM 7.9* 8.0*     Intake/Output Summary (Last 24 hours) at 12/06/2021 0819 Last data filed at 12/05/2021 1700 Gross per 24 hour  Intake 120 ml  Output --  Net 120 ml      Pressure Injury 12/04/21 Arm Lower;Posterior;Proximal;Right Stage 1 -  Intact skin with non-blanchable redness of a localized area usually over a bony prominence. reddness to the right elbow (Active)  12/04/21 1710  Location: Arm  Location Orientation: Lower;Posterior;Proximal;Right  Staging: Stage 1 -  Intact skin with non-blanchable redness of a localized area usually over a bony prominence.  Wound Description (Comments): reddness to the right elbow  Present on Admission: Yes    Physical Exam: Vital Signs Blood pressure (!) 129/93, pulse (!) 108, temperature 98.4 F (36.9 C), resp. rate 16, weight 90 kg, SpO2 95 %.  General: No acute distress Mood and affect are appropriate Heart: Regular rate and rhythm no rubs murmurs or extra sounds Lungs: Clear to auscultation, breathing unlabored, no rales or wheezes Abdomen: Positive bowel sounds, soft nontender to palpation, nondistended Extremities: No clubbing, cyanosis, or edema Skin: ACE wrap bilateral legs Neurologic:oriented to person and place, think he last saw author ~3-4 d ago (actually yesterday)  Cranial nerves II  through XII intact, motor strength is 5/5 in bilateral deltoid, bicep, tricep, grip, hip flexor, knee extensors, ankle dorsiflexor and plantar flexor Sensory exam normal sensation to light touch  in bilateral upper and lower extremities Cerebellar exam normal finger to nose to finger as well as heel to shin in bilateral upper and lower extremities Musculoskeletal: Full range of motion in all 4 extremities. No joint swelling    Assessment/Plan: 1. Functional deficits which require 3+ hours per day of interdisciplinary therapy in a comprehensive inpatient rehab setting. Physiatrist is providing close team supervision and 24 hour management of active medical problems listed below. Physiatrist and rehab team continue to assess barriers to discharge/monitor patient progress toward functional and medical goals  Care Tool:  Bathing    Body parts bathed by patient: Face, Right arm, Left arm, Chest, Abdomen, Front perineal area, Buttocks, Right upper leg, Left upper leg, Right lower leg, Left lower leg         Bathing assist Assist Level: Contact Guard/Touching assist (stance)     Upper Body Dressing/Undressing Upper body dressing   What is the patient wearing?: Pull over shirt    Upper body assist Assist Level: Supervision/Verbal cueing  Lower Body Dressing/Undressing Lower body dressing      What is the patient wearing?: Pants     Lower body assist Assist for lower body dressing: Contact Guard/Touching assist     Toileting Toileting    Toileting assist Assist for toileting: Minimal Assistance - Patient > 75%     Transfers Chair/bed transfer  Transfers assist     Chair/bed transfer assist level: Minimal Assistance - Patient > 75%     Locomotion Ambulation   Ambulation assist      Assist level: Contact Guard/Touching assist Assistive device: Walker-rolling (and w/o AD.) Max distance: 70   Walk 10 feet activity   Assist     Assist level: Contact  Guard/Touching assist Assistive device: No Device, Walker-rolling   Walk 50 feet activity   Assist    Assist level: Contact Guard/Touching assist Assistive device: No Device, Walker-rolling    Walk 150 feet activity   Assist Walk 150 feet activity did not occur: Safety/medical concerns  Assist level: Moderate Assistance - Patient - 50 - 74% Assistive device: No Device    Walk 10 feet on uneven surface  activity   Assist Walk 10 feet on uneven surfaces activity did not occur: Safety/medical concerns         Wheelchair     Assist Is the patient using a wheelchair?: Yes Type of Wheelchair: Manual    Wheelchair assist level: Contact Guard/Touching assist Max wheelchair distance: 40    Wheelchair 50 feet with 2 turns activity    Assist    Wheelchair 50 feet with 2 turns activity did not occur: Safety/medical concerns   Assist Level: Minimal Assistance - Patient > 75%   Wheelchair 150 feet activity     Assist  Wheelchair 150 feet activity did not occur: Safety/medical concerns   Assist Level: Maximal Assistance - Patient 25 - 49%   Blood pressure (!) 129/93, pulse (!) 108, temperature 98.4 F (36.9 C), resp. rate 16, weight 90 kg, SpO2 95 %.  Medical Problem List and Plan: 1. Functional deficits secondary to multiple, bi-cerebral embolic CVA's likely due to hypercoagulable state             -patient may shower with feet covered             -ELOS/Goals: 10-12 days, supervision to min assist with PT,OT and supervision with SLP             -will have to adjust therapy schedule to accommodate for midday XRT- may need 15/7 schedule  2.  Antithrombotics: -DVT/anticoagulation:  Pharmaceutical: Other (comment)--Eliquis             -antiplatelet therapy: N/A 3. Pain Management: Tylenol 4. Mood: pt struggling with diagnosis and end of life thoughts -would like for him to speak with neuropsychology while he's here -palliative care also would be  appropriate.  -remeron trial (discussed with pt/wife)- will reduce to 7.5 mg due to concerns about sedation  Anxiety -on klonopin will reduce to .25 mg , avoid giving med after midnoc            -antipsychotic agents: N/A 5. Neuropsych: This patient is not fully capable of making decisions on his own behalf. 6. Skin/Wound Care:  Routine pressure relief measures             --Monitor elbow/BLE for healing                          -add  warm moist compress to elbow bid, otherwise wrap with kerlex             -might benefit from a darco-type shoe for gait given his heel wounds 7. Fluids/Electrolytes/Severe protein deficient malutrition: Monitor I/O. Check CMET in am.              -add protein supplement             -add remeron for mood/appetite as opposed to megace given his hypercoagulable state 8. Metastatic lung cancer: Now on oxygen--using 3-4 liters. Has difficulty lying flat due to dyspnea             --chemo/immunotherapy on hold             --Will be starting daily XRT starting Monday. As discussed with son, this will likely contribute to fatigue level  9. Urinary retention?:  Has been having difficulty voiding since July. CT showed thickened bladder             --will check UA/UCS to rule out cystitis- doubt given  Duricef - UA neg             --monitor voiding with bladder scan/PVR checks.  10. Septic olecranon bursitis: On Duricef 1000 mg bid for 2-3 weeks --Will need follow up with ID on 11/28 for input to finalize course of treatment.  -local care 11. Leukocytosis: Felt to be steroid effect.  12. Hyponatremia: Na continues to fluctuate from 120 at admission-->126-->124, urine Na 46 on 12/12 13. Anemia/Thrombocytopenia: Felt to be due to chemo. Monitor for signs of bleeding.  -daily CBC- stable/improving Hgb 15. Candida esophagitis: On diflucan. Will schedule MMW qid as this was effective at home.    LOS: 2 days A FACE TO FACE EVALUATION WAS PERFORMED  Charlett Blake 12/06/2021, 8:19 AM

## 2021-12-07 ENCOUNTER — Ambulatory Visit
Admission: RE | Admit: 2021-12-07 | Discharge: 2021-12-07 | Disposition: A | Discharge status: Home / Self Care | Payer: BC Managed Care – PPO | Source: Ambulatory Visit | Attending: Radiation Oncology | Admitting: Radiation Oncology

## 2021-12-07 ENCOUNTER — Ambulatory Visit: Payer: BC Managed Care – PPO | Admitting: Radiation Oncology

## 2021-12-07 DIAGNOSIS — C7931 Secondary malignant neoplasm of brain: Secondary | ICD-10-CM | POA: Diagnosis not present

## 2021-12-07 LAB — CBC
HCT: 32.5 % — ABNORMAL LOW (ref 39.0–52.0)
Hemoglobin: 11.4 g/dL — ABNORMAL LOW (ref 13.0–17.0)
MCH: 32 pg (ref 26.0–34.0)
MCHC: 35.1 g/dL (ref 30.0–36.0)
MCV: 91.3 fL (ref 80.0–100.0)
Platelets: 61 10*3/uL — ABNORMAL LOW (ref 150–400)
RBC: 3.56 MIL/uL — ABNORMAL LOW (ref 4.22–5.81)
RDW: 16.9 % — ABNORMAL HIGH (ref 11.5–15.5)
WBC: 30.3 10*3/uL — ABNORMAL HIGH (ref 4.0–10.5)
nRBC: 0.4 % — ABNORMAL HIGH (ref 0.0–0.2)

## 2021-12-07 LAB — BASIC METABOLIC PANEL
Anion gap: 9 (ref 5–15)
BUN: 24 mg/dL — ABNORMAL HIGH (ref 6–20)
CO2: 29 mmol/L (ref 22–32)
Calcium: 7.9 mg/dL — ABNORMAL LOW (ref 8.9–10.3)
Chloride: 88 mmol/L — ABNORMAL LOW (ref 98–111)
Creatinine, Ser: 0.88 mg/dL (ref 0.61–1.24)
GFR, Estimated: >60 mL/min (ref >60)
Glucose, Bld: 100 mg/dL — ABNORMAL HIGH (ref 70–99)
Potassium: 3.6 mmol/L (ref 3.5–5.1)
Sodium: 126 mmol/L — ABNORMAL LOW (ref 135–145)

## 2021-12-07 MED ORDER — POLYETHYLENE GLYCOL 3350 17 G PO PACK
17.0000 g | PACK | Freq: Every day | ORAL | Status: DC
  Administered 2021-12-08 – 2021-12-10 (×3): 17 g via ORAL
  Filled 2021-12-07 (×3): qty 1

## 2021-12-07 MED ORDER — NICOTINE 14 MG/24HR TD PT24
14.0000 mg | MEDICATED_PATCH | Freq: Every day | TRANSDERMAL | Status: DC
Start: 2021-12-08 — End: 2021-12-11
  Administered 2021-12-08 – 2021-12-10 (×3): 14 mg via TRANSDERMAL
  Filled 2021-12-07 (×3): qty 1

## 2021-12-07 MED ORDER — MAGNESIUM HYDROXIDE 400 MG/5ML PO SUSP: 30.0000 mL | Freq: Every day | ORAL | Status: DC | PRN

## 2021-12-07 MED ORDER — TAMSULOSIN HCL 0.4 MG PO CAPS
0.4000 mg | ORAL_CAPSULE | Freq: Every day | ORAL | Status: DC
  Administered 2021-12-07 – 2021-12-10 (×4): 0.4 mg via ORAL
  Filled 2021-12-07 (×4): qty 1

## 2021-12-07 NOTE — Discharge Instructions (Addendum)
Inpatient Rehab Discharge Instructions  Christopher Randall Discharge date and time:    Activities/Precautions/ Functional Status: Activity: no lifting, driving, or strenuous exercise till cleared by MD Diet: regular diet Wound Care:    Functional status:  ___ No restrictions     ___ Walk up steps independently _X__ 24/7 supervision/assistance   ___ Walk up steps with assistance ___ Intermittent supervision/assistance  ___ Bathe/dress independently ___ Walk with walker     ___ Bathe/dress with assistance ___ Walk Independently    ___ Shower independently ___ Walk with assistance    _X__ Shower with assistance _X__ No alcohol     ___ Return to work/school ________   Special Instructions:    My questions have been answered and I understand these instructions. I will adhere to these goals and the provided educational materials after my discharge from the hospital.  Patient/Caregiver Signature _______________________________ Date __________  Clinician Signature _______________________________________ Date __________  Please bring this form and your medication list with you to all your follow-up doctor's appointments.

## 2021-12-07 NOTE — Progress Notes (Signed)
PROGRESS NOTE   Subjective/Complaints: Pt still feels a little fatigued. Is moving well with therapy. Nurse found chewing tobacco on patient today--"I have to have at least a little enjoyment given the situation."  Still feels SOB  ROS: Patient denies fever, rash, sore throat, blurred vision, nausea, vomiting, diarrhea,  chest pain, joint or back pain, headache, or mood change.   Objective:   No results found. Recent Labs    12/06/21 0515 12/07/21 0654  WBC 37.1* 30.3*  HGB 11.1* 11.4*  HCT 31.3* 32.5*  PLT 43* 61*   Recent Labs    12/05/21 0506 12/07/21 0654  NA 126* 126*  K 3.8 3.6  CL 88* 88*  CO2 27 29  GLUCOSE 113* 100*  BUN 20 24*  CREATININE 0.91 0.88  CALCIUM 8.0* 7.9*    Intake/Output Summary (Last 24 hours) at 12/07/2021 1235 Last data filed at 12/07/2021 0747 Gross per 24 hour  Intake 960 ml  Output --  Net 960 ml     Pressure Injury 12/04/21 Arm Lower;Posterior;Proximal;Right Stage 1 -  Intact skin with non-blanchable redness of a localized area usually over a bony prominence. reddness to the right elbow (Active)  12/04/21 1710  Location: Arm  Location Orientation: Lower;Posterior;Proximal;Right  Staging: Stage 1 -  Intact skin with non-blanchable redness of a localized area usually over a bony prominence.  Wound Description (Comments): reddness to the right elbow  Present on Admission: Yes    Physical Exam: Vital Signs Blood pressure 126/81, pulse (!) 108, temperature 98.1 F (36.7 C), temperature source Oral, resp. rate 16, weight 86.3 kg, SpO2 97 %.  Constitutional: No distress . Vital signs reviewed. HEENT: NCAT, EOMI, oral membranes moist, O2  Neck: supple Cardiovascular: RRR without murmur. No JVD    Respiratory/Chest: CTA Bilaterally without wheezes or rales. Normal effort    GI/Abdomen: BS +, non-tender, non-distended Ext: no clubbing, cyanosis, or edema Psych: pleasant and  cooperative  Skin: ACE wrap bilateral legs, foot/ankle wounds, left eblow looks much better with decreased swelling as well. Neurologic:fair insight and memory, oriented x 3. Remembered me from Friday.  Cranial nerves II through XII intact, motor strength is 5/5 in bilateral deltoid, bicep, tricep, grip, hip flexor, knee extensors, ankle dorsiflexor and plantar flexor Sensory exam normal sensation to light touch  in bilateral upper and lower extremities Cerebellar exam normal finger to nose to finger as well as heel to shin in bilateral upper and lower extremities Musculoskeletal: Full range of motion in all 4 extremities. No joint swelling    Assessment/Plan: 1. Functional deficits which require 3+ hours per day of interdisciplinary therapy in a comprehensive inpatient rehab setting. Physiatrist is providing close team supervision and 24 hour management of active medical problems listed below. Physiatrist and rehab team continue to assess barriers to discharge/monitor patient progress toward functional and medical goals  Care Tool:  Bathing    Body parts bathed by patient: Face, Right arm, Left arm, Chest, Abdomen, Front perineal area, Buttocks, Right upper leg, Left upper leg, Right lower leg, Left lower leg         Bathing assist Assist Level: Contact Guard/Touching assist (stance)     Upper  Body Dressing/Undressing Upper body dressing   What is the patient wearing?: Pull over shirt    Upper body assist Assist Level: Supervision/Verbal cueing    Lower Body Dressing/Undressing Lower body dressing      What is the patient wearing?: Pants     Lower body assist Assist for lower body dressing: Contact Guard/Touching assist     Toileting Toileting    Toileting assist Assist for toileting: Minimal Assistance - Patient > 75%     Transfers Chair/bed transfer  Transfers assist     Chair/bed transfer assist level: Minimal Assistance - Patient > 75%      Locomotion Ambulation   Ambulation assist      Assist level: Contact Guard/Touching assist Assistive device: Walker-rolling Max distance: 50   Walk 10 feet activity   Assist     Assist level: Contact Guard/Touching assist Assistive device: Walker-rolling   Walk 50 feet activity   Assist    Assist level: Contact Guard/Touching assist Assistive device: Walker-rolling    Walk 150 feet activity   Assist Walk 150 feet activity did not occur: Safety/medical concerns         Walk 10 feet on uneven surface  activity   Assist Walk 10 feet on uneven surfaces activity did not occur: Safety/medical concerns         Wheelchair     Assist Is the patient using a wheelchair?: Yes Type of Wheelchair: Manual    Wheelchair assist level: Contact Guard/Touching assist Max wheelchair distance: 40    Wheelchair 50 feet with 2 turns activity    Assist    Wheelchair 50 feet with 2 turns activity did not occur:  (per PT documentation)   Assist Level: Minimal Assistance - Patient > 75%   Wheelchair 150 feet activity     Assist  Wheelchair 150 feet activity did not occur:  (per PT documentation)   Assist Level: Maximal Assistance - Patient 25 - 49%   Blood pressure 126/81, pulse (!) 108, temperature 98.1 F (36.7 C), temperature source Oral, resp. rate 16, weight 86.3 kg, SpO2 97 %.  Medical Problem List and Plan: 1. Functional deficits secondary to multiple, bi-cerebral embolic CVA's likely due to hypercoagulable state             -patient may shower with feet covered             -ELOS/Goals: 5-7 days, mod I to supervision             -  adjusting therapy schedule to accommodate for midday XRT- may need 15/7 schedule  2.  Antithrombotics: -DVT/anticoagulation:  Pharmaceutical: Other (comment)--Eliquis             -antiplatelet therapy: N/A 3. Pain Management: Tylenol 4. Mood: pt struggling with diagnosis and end of life thoughts -would like for  him to speak with neuropsychology while he's here -palliative care also would be appropriate.  -remeron trial : reduced to 7.5 mg due to concerns about sedation  Anxiety -on klonopin at reduced dose HS             -antipsychotic agents: N/A 12/19 pt seemed pretty alert today  -will add nicotine patch 5. Neuropsych: This patient is not fully capable of making decisions on his own behalf. 6. Skin/Wound Care:  Routine pressure relief measures             --Monitor elbow/BLE for healing                          -  added warm moist compress to elbow bid, otherwise wrap with kerlex             -might benefit from a darco-type shoe for gait given his heel wounds  12/19 left elbow much improved today 7. Fluids/Electrolytes/Severe protein deficient malutrition: Monitor I/O. Check CMET in am.              -add protein supplement             -continue remeron for mood/appetite  --sporadic right now 8. Metastatic lung cancer: Now on oxygen--using 3-4 liters. Has difficulty lying flat due to dyspnea             --chemo/immunotherapy on hold             --  starting daily XRT starting today,Monday. As discussed with son, this will likely contribute to fatigue level  9. Urinary retention?:  Has been having difficulty voiding since July. CT showed thickened bladder             --ua/ucx negative             --monitor voiding with bladder scan/PVR checks.  10. Septic olecranon bursitis: On Duricef 1000 mg bid for 2-3 weeks --Will need follow up with ID on 11/28 for input to finalize course of treatment.  -local care, arm looks better today 11. Leukocytosis: Felt to be steroid effect.  12. Hyponatremia: Na continues to fluctuate from 120 at admission-->126-->124, urine Na 46 on 12/12--->Na+ 126 12/19 13. Anemia/Thrombocytopenia: Felt to be due to chemo. Monitor for signs of bleeding.  -daily CBC- stable/improving Hgb-->11.4 15. Candida esophagitis: On diflucan. Will schedule MMW qid as this was effective at  home. 16. Leukocytosis: steadily falling  30.3 12/19    LOS: 3 days A FACE TO FACE EVALUATION WAS PERFORMED  [name] 12/07/2021, 12:35 PM

## 2021-12-07 NOTE — Progress Notes (Signed)
SLP Cancellation Note  Patient Details Name: Christopher Randall MRN: [id number] DOB: 05-17-1961   Cancelled treatment:  Pt out of facility for midday XRT at this time, missed 45 minutes of skilled tx. Will continue with ST POC.   Dewaine Conger 12/07/2021, 2:54 PM

## 2021-12-07 NOTE — Progress Notes (Signed)
I saw Mr. Mears and his wife today. Mr. Liska mental status appears to have declined since Friday 12/16.  He did not remember receiving his radiotherapy (fraction 1 to chest) 10 minutes earlier. He did tolerate the treatment well and I reviewed the rationale for the chest radiotherapy with his wife again today. His wife is tearful and wants insight /expertise on his neurological status/etiology for cognitive decline.  I have contacted his neurologists about these concerns. Dr. Erlinda Hong is out of town; Dr. Mickeal Skinner is going to try to arrange followup visit ASAP.  I sat with Mr. Stucke and his wife and provided emotional support especially for her. This is a very difficult time and I encouraged her to call me this week if she has other questions or needs. Dr Sondra Come will be covering for me next week and I let her know this. We will continue RT to the chest with palliative intent, as planned. -----------------------------------  Eppie Gibson, MD

## 2021-12-07 NOTE — Progress Notes (Signed)
Physical Therapy Session Note  Patient Details  Name: Christopher Randall MRN: [id number] Date of Birth: 12/11/1961  Today's Date: 12/07/2021 PT Individual Time: 1000-1100 PT Individual Time Calculation (min): 60 min   Short Term Goals: Week 1:  PT Short Term Goal 1 (Week 1): STGs + LTG due to ELOS.  Skilled Therapeutic Interventions/Progress Updates: Pt presents sitting in recliner and agreeable to therapy.  Pt O2 sats at 89% on 4 LPM sitting, but educated on breathing technique and increased to 98% w/in 10 seconds.  Pt transfers sit to stand w/ min A and verbal cues for hand placement.  Pt performs w/ split hand technique, 1 hand on chair, 1 hand on RW.  Pt amb across room w/ CGA to w/c.Pt wheeled to 5th floor gym and transferred to Nu-step w/ RW and CGA.  Pt requires constant verbal cues for hand placement for safe transition stand to sit as tends to sit before reaching seat and leaving RW too far away.  Pt tolerated Nu-step x 1' x 3 trials at Level 1 w/ reported 17 on Borg scale, HR 109, O2 sats on 4LPM at 99%. Pt required extended rest breaks 2/2 fatigue.  Pt amb 45', 50' 45' and 20' into room and back to recliner w/ CGA.  Pt required seated rest breaks w/ HR 105 and O2 sats of 97% although states fatigue.  Pt son present and states speech is much different since 3 days ago.  Messaged ST and will check, nursing also aware of concerns.  Chair alarm on and all needs in reach.  Son in room.     Therapy Documentation Precautions:  Precautions Precautions: Fall Precaution Comments: L elbow septic bursitis, B heel wounds, monitor SpO2, decreased cognition Restrictions Weight Bearing Restrictions: No General:   Vital Signs:  Pain:0/10, but c/o stretching of B HC w/ activity. Pain Assessment Pain Scale: 0-10 Pain Score: 0-No pain Mobility:       Therapy/Group: Individual Therapy  Christopher Randall 12/07/2021, 12:14 PM

## 2021-12-07 NOTE — Progress Notes (Signed)
Physical Therapy Session Note  Patient Details  Name: Christopher Randall MRN: [id number] Date of Birth: 09/23/1961  Today's Date: 12/07/2021 PT Individual Time: 1515-1540 PT Individual Time Calculation (min): 25 min   Short Term Goals: Week 1:  PT Short Term Goal 1 (Week 1): STGs + LTG due to ELOS.  Skilled Therapeutic Interventions/Progress Updates:    Pt received seated in recliner in room, agreeable to PT session. No complaints of pain. Sit to stand with Supervision to RW during session. Ambulation x 90 ft, x 100 ft with RW and CGA for balance before needing rest break due to fatigue. Pt is able to almost double distance he was ambulating this AM during this session. Pt on 4L O2 at rest and with activity, SpO2 remains at 90% (+) with activity. Pt left seated in recliner in room with needs in reach at end of session.  Therapy Documentation Precautions:  Precautions Precautions: Fall Precaution Comments: L elbow septic bursitis, B heel wounds, monitor SpO2, decreased cognition Restrictions Weight Bearing Restrictions: No     Therapy/Group: Individual Therapy   Excell Seltzer, PT, DPT, CSRS  12/07/2021, 5:33 PM

## 2021-12-07 NOTE — Progress Notes (Signed)
Inpatient Rehabilitation  Patient information reviewed and entered into eRehab system by Ibrahim Mcpheeters Bartlett Enke, OTR/L.   Information including medical coding, functional ability and quality indicators will be reviewed and updated through discharge.    

## 2021-12-07 NOTE — Progress Notes (Signed)
Inpatient [facility] Individual Statement of Services  Patient Name:  Christopher Randall  Date:  12/07/2021  Welcome to the [facility].  Our goal is to provide you with an individualized program based on your diagnosis and situation, designed to meet your specific needs.  With this comprehensive rehabilitation program, you will be expected to participate in at least 3 hours of rehabilitation therapies Monday-Friday, with modified therapy programming on the weekends.  Your rehabilitation program will include the following services:  Physical Therapy (PT), Occupational Therapy (OT), Speech Therapy (ST), 24 hour per day rehabilitation nursing, Therapeutic Recreaction (TR), Neuropsychology, Care Coordinator, Rehabilitation Medicine, Nutrition Services, Pharmacy Services, and Other  Weekly team conferences will be held on Wednesday to discuss your progress.  Your Inpatient Rehabilitation Care Coordinator will talk with you frequently to get your input and to update you on team discussions.  Team conferences with you and your family in attendance may also be held.  Expected length of stay: 10-12 Days  Overall anticipated outcome:  Supervision to Min A  Depending on your progress and recovery, your program may change. Your Inpatient Rehabilitation Care Coordinator will coordinate services and will keep you informed of any changes. Your Inpatient Rehabilitation Care Coordinator's name and contact numbers are listed  below.  The following services may also be recommended but are not provided by the [facility]:   [place] will be made to provide these services after discharge if needed.  Arrangements include referral to agencies that provide these services.  Your insurance has been verified to be:  [facility] Your primary doctor is:  Allwardt, Alyssa,  PA-C  Pertinent information will be shared with your doctor and your insurance company.  Inpatient Rehabilitation Care Coordinator:  Erlene Quan, [place] or [phone]  Information discussed with and copy given to patient by: Dyanne Iha, 12/07/2021, 12:17 PM

## 2021-12-07 NOTE — Progress Notes (Signed)
Occupational Therapy Session Note  Patient Details  Name: Christopher Randall MRN: [id number] Date of Birth: Mar 28, 1961  Today's Date: 12/07/2021 OT Individual Time: 6553-7482 OT Individual Time Calculation (min): 57 min    Short Term Goals: Week 1:  OT Short Term Goal 1 (Week 1): STG = LTG 2/2 ELOS  Skilled Therapeutic Interventions/Progress Updates:    Pt in recliner to start with spouse present.  He was able to complete functional mobility over to the sink for grooming tasks of washing his face and hands at overall min assist using the RW for support.  Decreased intellectual awareness noted as pt reports not having decreased balance, even though he used a walker for the transfer and with standing exhibited increased swaying forward and backwards.  Oxygen sats 94% on 4Ls nasal cannula after standing with HR increasing into the 110 range.  Next, he ambulated out into the hallway without an assistive device and min assist.  Pt reaching for doors and door facings as well at the rail in the hallway secondary to balance deficits and pain in LEs with weightbearing.  He was asked if he felt he needed a walker and replied "yes".  Had him use the RW to walk another 30' before requesting to sit.  Decreased safety noted with pt pushing the walker too far in front of him resulting in increased flexion as well as not reaching back to the arms of the chair when sitting and cued by therapist.  Evelina Bucy him the rest of the way to the therapy gym via wheelchair with transfer stand pivot to the therapy mat.  Focused rest of session on standing balance and endurance while having to pick up and place playing cards on the mirror to match the ones already there.  Mod demonstrational cueing to start with pt placing 80% of the cards correctly in standing, but needed multiple seated rest breaks.  He was only able to tolerate standing for up to one minute before requesting to sit.  Noted pt while resting to have smokeless tobacco  can in his shorts pocket as well, which therapist questioned him about and removed.  Nursing and covering MD notified about this as well.  Oxygen sats 94% on 4Ls between standing intervals with HR at 105.  Finished session with return to the room via wheelchair and transfer to the recliner where he was left with the safety alarm belt in place and call button and phone in reach.  He was able to show therapist how to get help if needed as well.    Therapy Documentation Precautions:  Precautions Precautions: Fall Precaution Comments: L elbow septic bursitis, B heel wounds, monitor SpO2, decreased cognition Restrictions Weight Bearing Restrictions: No  Pain: Pain Assessment Pain Scale: 0-10 Pain Score: 0-No pain ADL: See Care Tool Section for some details of mobility and selfcare    Therapy/Group: Individual Therapy  Arriona Prest OTR/L  12/07/2021, 9:52 AM

## 2021-12-08 ENCOUNTER — Ambulatory Visit
Admission: RE | Admit: 2021-12-08 | Discharge: 2021-12-08 | Disposition: A | Discharge status: Home / Self Care | Payer: BC Managed Care – PPO | Source: Ambulatory Visit | Attending: Radiation Oncology | Admitting: Radiation Oncology

## 2021-12-08 ENCOUNTER — Ambulatory Visit: Payer: BC Managed Care – PPO

## 2021-12-08 LAB — CBC
HCT: 31.4 % — ABNORMAL LOW (ref 39.0–52.0)
Hemoglobin: 11.1 g/dL — ABNORMAL LOW (ref 13.0–17.0)
MCH: 32 pg (ref 26.0–34.0)
MCHC: 35.4 g/dL (ref 30.0–36.0)
MCV: 90.5 fL (ref 80.0–100.0)
Platelets: 56 10*3/uL — ABNORMAL LOW (ref 150–400)
RBC: 3.47 MIL/uL — ABNORMAL LOW (ref 4.22–5.81)
RDW: 17.2 % — ABNORMAL HIGH (ref 11.5–15.5)
WBC: 27.3 10*3/uL — ABNORMAL HIGH (ref 4.0–10.5)
nRBC: 0.4 % — ABNORMAL HIGH (ref 0.0–0.2)

## 2021-12-08 NOTE — Progress Notes (Signed)
Patient is A&O x 3, can answer questions appropriately however has confusion. Patient requested PRN tylenol for generalized body pain. Tylenol given as ordered. Patient continues to ask several times after medications were given. TX completed to bilateral lower extremities, and elbows per order. Patient left at 1200 for radiation and returned at approximately 1315. Continue to encourage elevation of BLE however patient refuses to elevate BLE while in bed or high back chair. Call light within reach, personal items within reach safety maintained.

## 2021-12-08 NOTE — Progress Notes (Signed)
Patient wife at bedside reports concerns of patient cognitive decline over past couple of days. Patient alert and oriented x4 to orientation questions but appears more confused this evening during general conversation. Patient states "I need to go downstairs to get my clothes" "go to the bathroom outside". Patient completed a radiation treatment this afternoon. Reesa Chew PA notified of family concerns. New order received for routine MRI. Continue to monitor.

## 2021-12-08 NOTE — Progress Notes (Signed)
Occupational Therapy Session Note  Patient Details  Name: Christopher Randall MRN: [id number] Date of Birth: November 05, 1961  Today's Date: 12/08/2021 OT Individual Time: 6789-3810 OT Individual Time Calculation (min): 28 min  and Today's Date: 12/08/2021 OT Missed Time: 47 Minutes Missed Time Reason: Patient fatigue   Short Term Goals: Week 1:  OT Short Term Goal 1 (Week 1): STG = LTG 2/2 ELOS  Skilled Therapeutic Interventions/Progress Updates:    Pt initially received asleep in bed, momentarily awakened to voice. SatO2 at 97% on 4L. Pt closing eyes and unable to rouse despite tactile and max verbal cues after checking O2 levels.   Returned 1 hour later and received seated up in recliner with RN present, denies pain but endorses dizziness, agreeable to therapy. Session focus on self-care retraining, activity tolerance, BUE strengthening in prep for improved ADL/IADL/func mobility performance + decreased caregiver burden. Pants noted to be halfway donned with pt with poor awareness. Pulled over hips with min A to pull over L hip, CGA for sit to stand with no AD. Donned robe with min A to thread LUE, as well. Pt noted to frequently close eyes in between therapeutic activities and to briefly fall asleep.   Pt able to complete 1x15 of the following with 4 lb dowel rod in prep for increased activity tolerance: forward circles, chest press, and B shoulder flexion. Pt noted to fall asleep in between sets and required extended therapeutic rest break prior to continuing. Rates activity as "somewhat tiring." Activity ceased due to increased fatigue. SatO2 at 93-95% on 4L via [place] with activity and HR up to 120 bpm.   Set-up A for breakfast.  Pt left seated in recliner with BLE elevated with safety belt alarm engaged, call bell in reach, and all immediate needs met.    Therapy Documentation Precautions:  Precautions Precautions: Fall Precaution Comments: L elbow septic bursitis, B heel wounds, monitor  SpO2, decreased cognition Restrictions Weight Bearing Restrictions: No  Pain: denies, endorses dizziness, declined intervention ADL: See Care Tool for more details. Therapy/Group: Individual Therapy  Volanda Napoleon MS, OTR/L  12/08/2021, 6:55 AM

## 2021-12-08 NOTE — Progress Notes (Signed)
Speech Language Pathology Daily Session Note  Patient Details  Name: Christopher Randall MRN: [id number] Date of Birth: [birth year]  Today's Date: 12/08/2021 SLP Individual Time: 1103-1594 SLP Individual Time Calculation (min): 60 min  Short Term Goals: Week 1: SLP Short Term Goal 1 (Week 1): STG = LTG d/t ELOS  Skilled Therapeutic Interventions: Skilled ST treatment focused on cognitive goals. SLP facilitated session by providing education on cognitive-communication changes in which pt reported he was unable to recall/observe at this time. However, he did acknowledge physical changes such as decreased strength and endurance. Pt observed UNO cards that SLP had from previous session and pt indicated interest in participating, as he enjoyed playing cards with his children years ago. Pt initially completed task with supervision A verbal cues for recall of instructions, alternating attention, and problem solving skills. Following approximately 5 minutes pt became increasingly restless and complained of headache and leg discomfort. Patient was unable to recall if he had already received pain medication therefore SLP prompted patient to press call bell to ask nurse. Following ~ 2 minutes call bell was answered by staff and at that time patient had forgotten he had pressed call bell to request assistance. SLP clarified information and nurse arrived shortly after and reminded patient that he had already received pain medication prior to SLP session. At this time patient was transferred to bed for the remainder of session per pt request. SLP provided consistent encouragement to elevate lower extremities but patient continued to reposition himself with legs hanging EOB. Upon getting into bed pt exhibited SOB and signs consistent with anxiety. SLP coached patient through obtaining long/sustained inhalation through nose and exhaling through mouth which pt effectively executed. SPO2 was at 94% which appeared to provide  pt with reassurance. Pt eventually able to engage in functional conversation about returning home and his interests. Patient was left in bed with alarm activated and immediate needs within reach at end of session. Continue per current plan of care.      Pain Pain Assessment Pain Scale: 0-10 Pain Score: 8  Pain Type: Acute pain Pain Location: Head Pain Descriptors / Indicators: Headache Pain Onset: On-going Pain Intervention(s): RN made aware;Emotional support;Environmental changes  Therapy/Group: Individual Therapy  Shuntay Everetts T Aishani Kalis 12/08/2021, 9:15 AM

## 2021-12-08 NOTE — Progress Notes (Signed)
Physical Therapy Session Note  Patient Details  Name: Christopher Randall MRN: [id number] Date of Birth: 1961/10/08  Today's Date: 12/08/2021 PT Individual Time: 7939-0300 PT Individual Time Calculation (min): 83 min   Short Term Goals: Week 1:  PT Short Term Goal 1 (Week 1): STGs + LTG due to ELOS.  Skilled Therapeutic Interventions/Progress Updates:    Patient received reclined in bed, agreeable to PT. He reports pain, but did not state where or rate despite PT inquiring. Premedicated. PT providing rest breaks, distractions and repositioning to assist with pain management. Patient perseverative on having B LE dressings changed. PT alerting nurse. Patient coming to sit edge of bed with supervision. Significantly prolonged period of time to rest and max encouragement to continue to participate in therapy. Patient removing his oxygen and then stating that he was SOB. PT educating patient on importance of keeping [name] on. Patient minimally agreeable. PT needing to re-don [name] throughout session. 4L O2 maintained. Patient ultimately completing sit > stand with RW and CGA. Ambulatory transfer to wc with RW and CGA. PT transporting patient in wc to therapy gym for time management and energy conservation. 4x15 reps on Kinetron at 50cm/s. Patient falling asleep between bouts and requiring extended rest due to fatigue and reminder of task at hand. PT attempting to engage patient in puzzle as he requested to only complete seated activities for remainder of session. Patient easily becoming frustrated when challenged. Patient able to complete peg board based on picture with min verbal cuing for accuracy. Patient completing slightly more complex pattern with Min cuing as well. PT transporting patient back to his room in wc. Requested to use bathroom. Ambulatory transfer with RW and CGA. Continent void. Supervision for clothing management in standing. Patient ambulating to recliner with RW and CGA. Patient limited this  session by fatigue, poor attention to task and anxiety. Seatbelt alarm on, call light within reach, B LE elevated, RN aware of patients location.   Therapy Documentation Precautions:  Precautions Precautions: Fall Precaution Comments: L elbow septic bursitis, B heel wounds, monitor SpO2, decreased cognition Restrictions Weight Bearing Restrictions: No    Therapy/Group: Individual Therapy  [name], PT, DPT, CBIS  12/08/2021, 9:31 AM

## 2021-12-08 NOTE — Progress Notes (Signed)
Dressings changed to BLE earlier in the night. 3+ pitting edema to feet. Patient back in bed with feet slightly elevated at this time. Oxygen [place] at 4L. Patient sleeping off and on. Son at the bedside.

## 2021-12-08 NOTE — Progress Notes (Signed)
PROGRESS NOTE   Subjective/Complaints:  Reviewed labs and meds Has fluctuating level of alertness  Appreciate rad onc note , started palliative RT to chest  Per RN, wife interested in palliative care consult   ROS: Patient denies CP, SOB, N/V/D   Objective:   No results found. Recent Labs    12/07/21 0654 12/08/21 0550  WBC 30.3* 27.3*  HGB 11.4* 11.1*  HCT 32.5* 31.4*  PLT 61* 56*    Recent Labs    12/07/21 0654  NA 126*  K 3.6  CL 88*  CO2 29  GLUCOSE 100*  BUN 24*  CREATININE 0.88  CALCIUM 7.9*     Intake/Output Summary (Last 24 hours) at 12/08/2021 0813 Last data filed at 12/07/2021 1837 Gross per 24 hour  Intake 360 ml  Output --  Net 360 ml      Pressure Injury 12/04/21 Arm Lower;Posterior;Proximal;Right Stage 1 -  Intact skin with non-blanchable redness of a localized area usually over a bony prominence. reddness to the right elbow (Active)  12/04/21 1710  Location: Arm  Location Orientation: Lower;Posterior;Proximal;Right  Staging: Stage 1 -  Intact skin with non-blanchable redness of a localized area usually over a bony prominence.  Wound Description (Comments): reddness to the right elbow  Present on Admission: Yes    Physical Exam: Vital Signs Blood pressure 120/83, pulse (!) 107, temperature (!) 97.4 F (36.3 C), resp. rate 18, weight 84.8 kg, SpO2 96 %.   General: No acute distress Mood and affect are appropriate Heart: Regular rate and rhythm no rubs murmurs or extra sounds Lungs: Clear to auscultation, breathing unlabored, no rales or wheezes Abdomen: Positive bowel sounds, soft nontender to palpation, nondistended Extremities: No clubbing, cyanosis, or edema   Skin: ACE wrap bilateral legs, foot/ankle wounds, left eblow looks much better with decreased swelling as well. Neurologic:Alert , oriented person, hospital, year  "Jan"  "Monday or TUesday"  Cranial nerves II through  XII intact, motor strength is 5/5 in bilateral deltoid, bicep, tricep, grip, hip flexor, knee extensors, ankle dorsiflexor and plantar flexor Sensory exam normal sensation to light touch  in bilateral upper and lower extremities Cerebellar exam normal finger to nose to finger RUE , minimal dysmetria LUE  Musculoskeletal: Full range of motion in all 4 extremities. No joint swelling    Assessment/Plan: 1. Functional deficits which require 3+ hours per day of interdisciplinary therapy in a comprehensive inpatient rehab setting. Physiatrist is providing close team supervision and 24 hour management of active medical problems listed below. Physiatrist and rehab team continue to assess barriers to discharge/monitor patient progress toward functional and medical goals  Care Tool:  Bathing    Body parts bathed by patient: Face, Right arm, Left arm, Chest, Abdomen, Front perineal area, Buttocks, Right upper leg, Left upper leg, Right lower leg, Left lower leg         Bathing assist Assist Level: Contact Guard/Touching assist (stance)     Upper Body Dressing/Undressing Upper body dressing   What is the patient wearing?: Pull over shirt    Upper body assist Assist Level: Supervision/Verbal cueing    Lower Body Dressing/Undressing Lower body dressing  What is the patient wearing?: Pants     Lower body assist Assist for lower body dressing: Contact Guard/Touching assist     Toileting Toileting    Toileting assist Assist for toileting: Minimal Assistance - Patient > 75%     Transfers Chair/bed transfer  Transfers assist     Chair/bed transfer assist level: Minimal Assistance - Patient > 75%     Locomotion Ambulation   Ambulation assist      Assist level: Contact Guard/Touching assist Assistive device: Walker-rolling Max distance: 50   Walk 10 feet activity   Assist     Assist level: Contact Guard/Touching assist Assistive device: Walker-rolling   Walk  50 feet activity   Assist    Assist level: Contact Guard/Touching assist Assistive device: Walker-rolling    Walk 150 feet activity   Assist Walk 150 feet activity did not occur: Safety/medical concerns         Walk 10 feet on uneven surface  activity   Assist Walk 10 feet on uneven surfaces activity did not occur: Safety/medical concerns         Wheelchair     Assist Is the patient using a wheelchair?: Yes Type of Wheelchair: Manual    Wheelchair assist level: Contact Guard/Touching assist Max wheelchair distance: 40    Wheelchair 50 feet with 2 turns activity    Assist    Wheelchair 50 feet with 2 turns activity did not occur:  (per PT documentation)   Assist Level: Minimal Assistance - Patient > 75%   Wheelchair 150 feet activity     Assist  Wheelchair 150 feet activity did not occur:  (per PT documentation)   Assist Level: Maximal Assistance - Patient 25 - 49%   Blood pressure 120/83, pulse (!) 107, temperature (!) 97.4 F (36.3 C), resp. rate 18, weight 84.8 kg, SpO2 96 %.  Medical Problem List and Plan: 1. 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  Antithrombotics: -DVT/anticoagulation:  Pharmaceutical: Other (comment)--Eliquis             -antiplatelet therapy: N/A 3. Pain Management: Tylenol 4. Mood: pt struggling with diagnosis and end of life thoughts -would like for him to speak with neuropsychology while he's here -palliative care  also would be appropriate.  -remeron trial : reduced to 7.5 mg due to concerns about sedation  Anxiety -on klonopin at reduced dose HS             -antipsychotic agents: N/A 12/19 pt seemed pretty alert today  -will add nicotine patch 5. Neuropsych: This patient is not fully capable of making decisions on his own behalf. 6. Skin/Wound Care:  Routine pressure relief measures             --Monitor elbow/BLE for healing                          -  added warm moist compress to elbow bid, otherwise wrap with kerlex             -might benefit from a darco-type shoe for gait given his heel wounds  12/19 left elbow much improved today 7. Fluids/Electrolytes/Severe protein deficient malutrition: Monitor I/O. Check CMET in am.              -add protein supplement             -continue remeron for mood/appetite  --sporadic right now 8. Metastatic lung cancer: Now on oxygen--using 3-4 liters. Has difficulty lying flat due to dyspnea             --chemo/immunotherapy on hold             --  starting daily XRT starting today,Monday. As discussed with son, this will likely contribute to fatigue level  9. Urinary retention?:  Has been having difficulty voiding since July. CT showed thickened bladder             --ua/ucx negative             --monitor voiding with bladder scan/PVR checks.  10. Septic olecranon bursitis: On Duricef 1000 mg bid for 2-3 weeks --Will need follow up with ID on 11/28 for input to finalize course of treatment.  -local care, arm looks better today 11. Leukocytosis: Felt to be steroid effect.  12. Hyponatremia: Na continues to fluctuate from 120 at admission-->126-->124, urine Na 46 on 12/12--->Na+ 126 12/19 13. Anemia/Thrombocytopenia: Felt to be due to chemo. Monitor for signs of bleeding.  -daily CBC- stable/improving Hgb-->11.4 15. Candida esophagitis: On diflucan. Will schedule MMW qid as this was effective at home. 16. Leukocytosis: steadily falling  30.3 12/19, 27.3 on  12/20    LOS: 4 days A FACE TO [place] E Kaine Mcquillen 12/08/2021, 8:13 AM

## 2021-12-08 NOTE — Progress Notes (Signed)
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 [facility]) Active Problems:   Cerebral infarction due to embolism of cerebral artery ([place])   Protein-calorie malnutrition, severe ([place])   Reactive depression   Pressure injury of skin  Past Medical History:  Past Medical History:  Diagnosis Date   Abnormal TSH 09/28/2017   Anemia 11/27/2021   Anxiety 07/03/2012   Depression    Difficulty sleeping 09/28/2017   Examination of participant in clinical trial 09/11/2012   History of immunotherapy 02/23/2016   Melanoma of skin 10/26/2011   Nicotine dependence 07/17/2015   Smoker    STEMI (ST elevation myocardial infarction) ([place]) 11/27/2021   Past Surgical History:  Past Surgical History:  Procedure Laterality Date   BRONCHIAL BIOPSY  10/09/2021   Procedure: BRONCHIAL BIOPSIES;  Surgeon: [name], MD;  Location: [facility] ENDOS[facility];  Service: Endoscopy;;   BRONCHIAL BRUSHINGS  10/09/2021   Procedure: BRONCHIAL BRUSHINGS;  Surgeon: [name], MD;  Location: [facility];  Service: Endoscopy;;   ENDOBRONCHIAL ULTRASOUND N/A 10/09/2021   Procedure: ENDOBRONCHIAL ULTRASOUND;  Surgeon: [name], MD;  Location: [facility];  Service: Endoscopy;  Laterality: N/A;   FINE NEEDLE ASPIRATION  10/09/2021   Procedure: FINE NEEDLE ASPIRATION (FNA) LINEAR;  Surgeon: [name], MD;  Location: [facility];  Service: Endoscopy;;   LEFT POPLITEAL MASS RESECTION  08/27/2011   pigmented villonodular synovitis, negative for malignancy   LYMPH GLAND EXCISION  07/12/2011   right axilla, consistent with melanoma   LYMPH NODE DISSECTION  08/27/2011   right, 0/17 nodes positive   VIDEO BRONCHOSCOPY N/A 10/09/2021   Procedure: VIDEO BRONCHOSCOPY WITHOUT FLUORO;  Surgeon: [name], MD;   Location: [facility];  Service: Endoscopy;  Laterality: N/A;   Social History:  reports that he has quit smoking. His smoking use included cigarettes. He smoked an average of .25 packs per day. He has never used smokeless tobacco. He reports current alcohol use of about 6.0 standard drinks per week. He reports that he does not use drugs.  Family / Support Systems Marital Status: Married Spouse/Significant Other: Arts development officer Anticipated Caregiver: Spouse and Family Ability/Limitations of Caregiver: none Caregiver Availability: 24/7 Family Dynamics: spouse primary caregiver  Social History Preferred language: English Religion: Catholic Writes: Yes Employment Status: Retired Date Retired/Disabled/Unemployed: September Legal History/Current Legal Issues: n/a Guardian/Conservator: Arts development officer   Abuse/Neglect Abuse/Neglect Assessment Can Be Completed: Yes Physical Abuse: Denies Verbal Abuse: Denies Sexual Abuse: Denies Exploitation of patient/patient's resources: Denies Self-Neglect: Denies  Patient response to: Social Isolation - How often do you feel lonely or isolated from those around you?: Never  Emotional Status Recent Psychosocial Issues: hx of depression & anxiety Psychiatric History: hx of depression & anxiety Substance Abuse History: smoking history  Patient / Family Perceptions, Expectations & Goals Pt/Family understanding of illness & functional limitations: yes Premorbid pt/family roles/activities: Independent & Active Anticipated changes in roles/activities/participation: spouse to assist Pt/family expectations/goals: Supervison to [employer]: None Premorbid Home Care/DME Agencies: Other (Comment) Charity fundraiser, Civil engineer, contracting) Transportation available at discharge: spouse Is the patient able to respond to transportation needs?: Yes In the past 12 months, has lack of transportation kept you from medical appointments or from getting  medications?: No In the past 12 months, has lack of transportation kept you from meetings, work, or from getting things  needed for daily living?: No  Discharge Planning Living Arrangements: Spouse/significant other Support Systems: Spouse/significant other Insurance Resources: Multimedia programmer (specify) ([employer] of [place]) Financial Resources: Family Support, Employment Financial Screen Referred: No Living Expenses: Lives with family Money Management: Patient Does the patient have any problems obtaining your medications?: No Home Management: Independent Patient/Family Preliminary Plans: spouse plans to assisting Care Coordinator Barriers to Discharge: Insurance for SNF coverage Care Coordinator Anticipated Follow Up Needs: HH/OP DC Planning Additional Notes/Comments: Radiation Treatments Expected length of stay: 10-12 days  Clinical Impression SW spoke wit pt spouse on 12/19. Introduced self, explained role and addressed questions and concerns. SW will follow up with spouse on Wednesday & meet on Thursday.   Dyanne Iha 12/08/2021, 10:35 AM

## 2021-12-08 NOTE — Progress Notes (Addendum)
STROKE TEAM PROGRESS NOTE   SUBJECTIVE (INTERVAL HISTORY) Called to see patient again due to concerns for altered mental status.  On exam, patient is alert and oriented x3 and able to discuss his current situation easily.  No focal neurologic deficits noted.  Change in mental status is more likely due to fatigue.  OBJECTIVE Temp:  [97.4 F (36.3 C)-98.2 F (36.8 C)] 97.5 F (36.4 C) (12/20 1326) Pulse Rate:  [93-107] 105 (12/20 1326) Resp:  [17-19] 19 (12/20 1326) BP: (117-124)/(72-92) 117/72 (12/20 1326) SpO2:  [94 %-100 %] 96 % (12/20 1326) Weight:  [84.8 kg] 84.8 kg (12/20 0500)  No results for input(s): GLUCAP in the last 168 hours. Recent Labs  Lab 12/02/21 0452 12/03/21 0331 12/04/21 0234 12/05/21 0506 12/07/21 0654  NA 126* 126* 124* 126* 126*  K 3.5 3.7 3.5 3.8 3.6  CL 90* 90* 88* 88* 88*  CO2 $Re'27 24 24 27 29  'LNd$ GLUCOSE 97 93 102* 113* 100*  BUN $Re'14 12 20 20 'Klw$ 24*  CREATININE 0.72 0.78 0.92 0.91 0.88  CALCIUM 7.9* 8.0* 7.9* 8.0* 7.9*  MG 1.8  --   --   --   --     Recent Labs  Lab 12/02/21 0452 12/05/21 0506  AST 45* 34  ALT 49* 35  ALKPHOS 105 87  BILITOT 0.4 0.6  PROT 4.8* 4.8*  ALBUMIN 1.8* 2.0*    Recent Labs  Lab 12/02/21 0452 12/03/21 0331 12/04/21 0234 12/05/21 0506 12/06/21 0515 12/07/21 0654 12/08/21 0550  WBC 40.8*   < > 40.2* 36.2* 37.1* 30.3* 27.3*  NEUTROABS 34.1*  --   --  28.9*  --   --   --   HGB 10.2*   < > 10.7* 10.5* 11.1* 11.4* 11.1*  HCT 29.6*   < > 30.3* 30.2* 31.3* 32.5* 31.4*  MCV 90.5   < > 89.9 90.7 88.9 91.3 90.5  PLT 89*   < > 70* 46* 43* 61* 56*   < > = values in this interval not displayed.    No results for input(s): CKTOTAL, CKMB, CKMBINDEX, TROPONINI in the last 168 hours. No results for input(s): LABPROT, INR in the last 72 hours.  No results for input(s): COLORURINE, LABSPEC, [place], GLUCOSEU, HGBUR, BILIRUBINUR, KETONESUR, PROTEINUR, UROBILINOGEN, NITRITE, LEUKOCYTESUR in the last 72 hours.  Invalid input(s):  APPERANCEUR     Component Value Date/Time   CHOL 173 11/28/2021 0031   TRIG 172 (H) 11/28/2021 0031   HDL 20 (L) 11/28/2021 0031   CHOLHDL 8.7 11/28/2021 0031   VLDL 34 11/28/2021 0031   LDLCALC 119 (H) 11/28/2021 0031   Lab Results  Component Value Date   HGBA1C 5.8 (H) 11/28/2021   No results found for: LABOPIA, COCAINSCRNUR, LABBENZ, AMPHETMU, THCU, LABBARB  No results for input(s): ETH in the last 168 hours.  I have personally reviewed the radiological images below and agree with the radiology interpretations.  DG Chest 2 View  Result Date: 11/27/2021 CLINICAL DATA:  Suspected sepsis EXAM: CHEST - 2 VIEW COMPARISON:  10/27/2021 FINDINGS: Heart is normal size. Bilateral airspace disease, most pronounced in the right lower lobe and left upper lobe. Small right pleural effusion. No acute bony abnormality. IMPRESSION: Bilateral airspace disease with small right effusion. Findings are concerning for multifocal pneumonia. Electronically Signed   By: Rolm Baptise M.D.   On: 11/27/2021 18:14   CT CHEST WO CONTRAST  Result Date: 12/02/2021 CLINICAL DATA:  Suspected enlarging right hilar mass with multifocal pneumonia/edema. Multifocal adenopathy  on prior CT. EXAM: CT CHEST WITHOUT CONTRAST TECHNIQUE: Multidetector CT imaging of the chest was performed following the standard protocol without IV contrast. COMPARISON:  CT with IV contrast 10/07/2021 FINDINGS: Cardiovascular: Mild panchamber cardiomegaly is increased since the prior study is also circumferential pericardial effusion now 1.5 cm in thickness, previously no more than 6 mm. There is interval increased distention of the superior pulmonary veins and of the pulmonary trunk with the pulmonary trunk 3.5 cm indicating arterial hypertension. There is patchy calcification in the left main, LAD and circumflex coronary arteries, mild calcification in the aorta and branch vessels, stably ectatic aortic root and ascending segment both measuring 4.2  cm with the remainder of the aorta normal caliber. Mediastinum/Nodes: Adenopathy is difficult to evaluate given the lack of IV contrast but there is notable worsening. An index left supraclavicular lymph node now measures 2.1 x 2.5 cm on series 3 axial 7, previously 2.2 x 2.2 cm. Axillary surgical clips are noted in the right but no axillary adenopathy. In the mediastinum, extensive adenopathy has worsened in the interval with lobular adenopathy along both hila also increased. The hilar adenopathy is difficult to separate from adjacent vessels, but an index right paratracheal lymph node now measures 3.5 x 3.4 cm on axial 7, was previously 2.7 x 2.2 cm, with an index AP window lymph node now 3.1 x 2.4 cm previously 1.7 x 1.2 cm, left-sided precarinal nodal complex now 3.3 x 1.6 cm on axial 59 and previously 1.5 x 0.9 cm; and a conglomerate, centrally necrotic subcarinal mass is now 4.7 x 4.1 cm on axial 73 mm previously 4.3 x 3.2 cm. No mass is seen visualized thyroid gland. The tracheal air column is patent. No focal esophageal thickening. Lungs/Pleura: There is a perihilar mass in the base of the right upper lobe centered medially but extending to the periphery and as before, is difficult to separate from adjacent atelectasis but has also enlarged, now measuring estimated 8.5 x 5.4 cm, previously 5.6 x 4.0 cm, again completely collapsing the right middle lobe first order bronchi and likely also the right lower lobe interlobar pulmonary artery. There is interval new demonstration of a moderate right and small left layering pleural effusions with a small amount of the right pleural fluid loculated anteriorly. There is interlobular septal thickening in the lung bases and apices also increased. Paraseptal emphysematous changes in the apices are again noted. Most of the right lower lobe is collapsed by the right pleural effusion. Elsewhere there are interstitial and dense ground-glass opacities in a patchy distribution  in the upper lobes with small amounts in the lower lobes, in the left lower lobe primarily in the periphery. I believe the right middle lobe is probably collapsed due to the mass. Upper Abdomen: There is increased adenopathy in the porta hepatis and gastrohepatic ligament as well as in the aortocaval space and portacaval space. Without contrast no mass in the visualized liver seen , no adrenal mass. Musculoskeletal: No destructive osseous or lytic lesions are observed. IMPRESSION: 1. Worsening of extensive mediastinal and hilar adenopathy with enlarging left supraclavicular and abdominal retroperitoneal nodes. 2. Enlarging right upper lobe mass which now appears to collapse the right middle lobe and effaces its proximal bronchi. 3. Cardiomegaly with increased pericardial effusion, increased venous distension, moderate right and small left pleural effusions, and interstitial edema in the lung apices and bases. 4. Interstitial and ground-glass opacities in the upper greater than lower lobes consistent with edema, pneumonia or combination. Alternatively lymphangitic carcinomatosis.  5. Aortic and coronary artery atherosclerosis with 4.2 cm ectasia of the aortic root and ascending segment, and increased prominence of the pulmonary trunk. Electronically Signed   By: Telford Nab M.D.   On: 12/02/2021 03:44   MR ANGIO HEAD WO CONTRAST  Result Date: 11/28/2021 CLINICAL DATA:  Metastatic disease to brain.  Acute stroke. EXAM: MRA HEAD WITHOUT CONTRAST TECHNIQUE: Angiographic images of the Circle of Willis were acquired using MRA technique without intravenous contrast. COMPARISON:  MRI head 11/27/2021 FINDINGS: Anterior circulation: Internal carotid artery patent bilaterally. Moderate stenosis right A2 segment. Left anterior cerebral artery widely patent. Middle cerebral artery widely patent bilaterally. Posterior circulation: Left vertebral artery is widely patent supplies the basilar. Left PICA patent. Distal right  vertebral artery not visualized and may be occluded. This appears to be a small vessel on the MRI. Basilar widely patent. Posterior cerebral arteries patent without large vessel occlusion. Fetal origin right posterior cerebral artery. Anatomic variants: None Other: Negative for cerebral aneurysm. IMPRESSION: Moderate stenosis right anterior cerebral artery. Left anterior cerebral artery widely patent. Both middle cerebral arteries patent Loss of flow related signal distal right vertebral artery. This may be occluded. Electronically Signed   By: Franchot Gallo M.D.   On: 11/28/2021 13:35   MR BRAIN W WO CONTRAST  Result Date: 11/27/2021 CLINICAL DATA:  Brain metastasis. Brain/central nervous system neoplasm; assess for treatment response. EXAM: MRI HEAD WITHOUT AND WITH CONTRAST TECHNIQUE: Multiplanar, multiecho pulse sequences of the brain and surrounding structures were obtained without and with intravenous contrast. CONTRAST:  74mL GADAVIST GADOBUTROL 1 MMOL/ML IV SOLN COMPARISON:  MR head 10/15/2021. FINDINGS: Motion artifact is present. Brain: The largest, likely extra-axial lesion along the right falx has decreased in size measuring approximately 1.2 x 0.6 cm (previously 2.1 x 1.2 cm). Left parasagittal frontal lesion measures approximately 5 mm (series 17, image 102) is slightly smaller (previously 6 mm). The small left parietal lesion is not definitely seen. Extensive edema in the right frontal lobe has nearly resolved. Edema in the anterior left frontal lobe has resolved. No definite new mass or abnormal enhancement. There are numerous foci of diffusion hyperintensity the cerebral hemispheres involving all but the temporal lobes as well as the cerebellar hemispheres and thalami. No hydrocephalus or extra-axial collection. Vascular: Major vessel flow voids at the skull base are preserved. Skull and upper cervical spine: Normal marrow signal is preserved. Sinuses/Orbits: Paranasal sinuses are aerated.  Orbits are unremarkable. Other: Sella is unremarkable.  Mastoid air cells are clear. IMPRESSION: Motion degraded study. Positive treatment response as detailed above. No definite new metastasis. Numerous small foci of acute to subacute infarcts involving multiple vascular territories suggestive of a central embolic source. These results will be called to the ordering clinician or representative by the Radiologist Assistant, and communication documented in the PACS or [employer]. Electronically Signed   By: Macy Mis M.D.   On: 11/27/2021 13:05   CT ELBOW LEFT W CONTRAST  Result Date: 11/28/2021 CLINICAL DATA:  Soft tissue infection suspected, upper arm, xray done EXAM: CT OF THE UPPER LEFT EXTREMITY WITH CONTRAST TECHNIQUE: Multidetector CT imaging of the upper left extremity was performed according to the standard protocol following intravenous contrast administration. CONTRAST:  164mL OMNIPAQUE IOHEXOL 300 MG/ML  SOLN COMPARISON:  None. FINDINGS: Technical note: Examination is limited by beam hardening artifact related to patient's positioning of the elbow adjacent to the abdominal wall. Elbow is in flexion resulting in nonstandard imaging planes. There is also slight motion degradation  affecting all series. Bones/Joint/Cartilage No acute fracture. No dislocation. No significant elbow joint effusion is seen. No bony destruction or periosteal elevation. Ligaments Suboptimally assessed by CT. Muscles and Tendons Musculotendinous structures are poorly evaluated. No obvious intramuscular fluid collection. Soft tissues Fluid collection overlying the olecranon measuring approximately 3.3 x 1.2 x 3.0 cm with peripheral enhancement (series 8, image 119; series 11, image 71) may represent abscess or bursitis. There is prominent soft tissue edema and ill-defined fluid at the elbow and proximal to mid forearm. No soft tissue gas. IMPRESSION: 1. Limited exam. 2. Fluid collection overlying the olecranon  measuring approximately 3.3 x 1.2 x 3.0 cm with peripheral enhancement may represent abscess or bursitis. 3. Prominent soft tissue edema and ill-defined fluid at the elbow and proximal to mid forearm suggestive of cellulitis. No soft tissue gas. 4. No acute osseous abnormality. 5. No appreciable elbow joint effusion to suggest septic arthritis. Electronically Signed   By: Davina Poke D.O.   On: 11/28/2021 19:46   DG Chest Port 1 View  Result Date: 11/30/2021 CLINICAL DATA:  Shortness of breath. EXAM: PORTABLE CHEST 1 VIEW COMPARISON:  November 27, 2021. FINDINGS: Stable cardiomediastinal silhouette. Increased central pulmonary vascular congestion is noted as well as bilateral lung opacities concerning for edema. Increased right middle lobe opacity is noted concerning for pneumonia, atelectasis or possibly fluid within the right minor fissure. Bony thorax is unremarkable. IMPRESSION: Increased central pulmonary vascular congestion is noted with increased bilateral lung opacities concerning for edema. Increased right middle lobe opacity is noted concerning for pneumonia, atelectasis or possibly fluid within the right minor fissure. Electronically Signed   By: Marijo Conception M.D.   On: 11/30/2021 08:51   ECHOCARDIOGRAM COMPLETE BUBBLE STUDY  Result Date: 11/28/2021    ECHOCARDIOGRAM REPORT   Patient Name:   TERENCE BART Date of Exam: 11/28/2021 Medical Rec #:  [id number]         Height:       69.0 in Accession #:    [id number]        Weight:       203.7 lb Date of Birth:  04-18-1961         BSA:          2.082 m Patient Age:    42 years          BP:           137/84 mmHg Patient Gender: M                 HR:           114 bpm. Exam Location:  Inpatient Procedure: 2D Echo and Saline Contrast Bubble Study Indications:     stroke  History:         Patient has no prior history of Echocardiogram examinations.                  Lung cancer. sepsis.  Sonographer:     Johny Chess RDCS Referring Phys:   [id number] [place] Diagnosing Phys: Jenkins Rouge MD  Sonographer Comments: Image acquisition challenging due to respiratory motion. IMPRESSIONS  1. Patient to return for more images to assess RWMAls and apex with definity . Left ventricular ejection fraction, by estimation, is 55%. The left ventricle has normal function. The left ventricle has no regional wall motion abnormalities. Left ventricular diastolic parameters were normal.  2. Right ventricular systolic function is normal. The right ventricular size is normal.  3. No evidence  of tamponade . Small to moderate. The pericardial effusion is posterior to the left ventricle and anterior to the right ventricle. There is no evidence of cardiac tamponade.  4. The mitral valve is grossly normal. No evidence of mitral valve regurgitation. No evidence of mitral stenosis.  5. The aortic valve is tricuspid. Aortic valve regurgitation is not visualized. No aortic stenosis is present.  6. The inferior vena cava is normal in size with greater than 50% respiratory variability, suggesting right atrial pressure of 3 mmHg.  7. Agitated saline contrast bubble study was negative, with no evidence of any interatrial shunt. FINDINGS  Left Ventricle: Patient to return for more images to assess RWMAls and apex with definity. Left ventricular ejection fraction, by estimation, is 55%. The left ventricle has normal function. The left ventricle has no regional wall motion abnormalities. The left ventricular internal cavity size was normal in size. There is no left ventricular hypertrophy. Left ventricular diastolic parameters were normal. Right Ventricle: The right ventricular size is normal. No increase in right ventricular wall thickness. Right ventricular systolic function is normal. Left Atrium: Left atrial size was normal in size. Right Atrium: Right atrial size was normal in size. Pericardium: No evidence of tamponade. Small to moderate. The pericardial effusion is posterior  to the left ventricle and anterior to the right ventricle. There is no evidence of cardiac tamponade. Presence of epicardial fat layer. Mitral Valve: The mitral valve is grossly normal. No evidence of mitral valve regurgitation. No evidence of mitral valve stenosis. Tricuspid Valve: The tricuspid valve is grossly normal. Tricuspid valve regurgitation is mild . No evidence of tricuspid stenosis. Aortic Valve: The aortic valve is tricuspid. Aortic valve regurgitation is not visualized. No aortic stenosis is present. Pulmonic Valve: The pulmonic valve was grossly normal. Pulmonic valve regurgitation is not visualized. No evidence of pulmonic stenosis. Aorta: The aortic root and ascending aorta are structurally normal, with no evidence of dilitation. Venous: The inferior vena cava is normal in size with greater than 50% respiratory variability, suggesting right atrial pressure of 3 mmHg. IAS/Shunts: The atrial septum is grossly normal. Agitated saline contrast was given intravenously to evaluate for intracardiac shunting. Agitated saline contrast bubble study was negative, with no evidence of any interatrial shunt.  LEFT VENTRICLE PLAX 2D LVIDd:         4.10 cm LVIDs:         2.60 cm LV PW:         1.10 cm LV IVS:        1.00 cm LVOT diam:     2.10 cm LVOT Area:     3.46 cm  RIGHT VENTRICLE         IVC TAPSE (M-mode): 2.7 cm  IVC diam: 2.00 cm LEFT ATRIUM             Index        RIGHT ATRIUM           Index LA diam:        3.40 cm 1.63 cm/m   RA Area:     16.90 cm LA Vol (A2C):   26.3 ml 12.63 ml/m  RA Volume:   39.50 ml  18.97 ml/m LA Vol (A4C):   32.4 ml 15.58 ml/m LA Biplane Vol: 29.2 ml 14.02 ml/m   AORTA Ao Root diam: 3.70 cm Ao Asc diam:  3.60 cm  SHUNTS Systemic Diam: 2.10 cm Charlton Haws MD Electronically signed by Charlton Haws MD Signature Date/Time: 11/28/2021/1:26:38 PM  Final (Updated)    VAS US CAROTID  Result Date: 11/30/2021 Carotid Arterial Duplex Study Patient Name:  KAYLON HITZ   Date of Exam:   11/28/2021 Medical Rec #: [id number]          Accession #:    [id number] Date of Birth: [birth year]          Patient Gender: M Patient Age:   20 years Exam Location:  [name] Procedure:      VAS US CAROTID Referring Phys: Terrilee Files [facility] --------------------------------------------------------------------------------  Indications:       CVA. Risk Factors:      Past history of smoking, prior MI. Comparison Study:  No prior study Performing Technologist: Gertie Fey MHA, RDMS, RVT, RDCS  Examination Guidelines: A complete evaluation includes B-mode imaging, spectral Doppler, color Doppler, and power Doppler as needed of all accessible portions of each vessel. Bilateral testing is considered an integral part of a complete examination. Limited examinations for reoccurring indications may be performed as noted.  Right Carotid Findings: +----------+--------+-------+--------+----------------------+------------------+             PSV cm/s EDV     Stenosis Plaque Description     Comments                                 cm/s                                                        +----------+--------+-------+--------+----------------------+------------------+  CCA Prox   103      24                                      intimal thickening  +----------+--------+-------+--------+----------------------+------------------+  CCA Distal 57       13                                      intimal thickening  +----------+--------+-------+--------+----------------------+------------------+  ICA Prox   37       17               smooth and                                                                       heterogenous                               +----------+--------+-------+--------+----------------------+------------------+  ICA Distal 66       30                                                          +----------+--------+-------+--------+----------------------+------------------+  ECA        125       12               smooth and                                                                       heterogenous                               +----------+--------+-------+--------+----------------------+------------------+ +----------+--------+-------+----------------+-------------------+             PSV cm/s EDV cms Describe         Arm Pressure (mmHG)  +----------+--------+-------+----------------+-------------------+  Subclavian 95               Multiphasic, WNL                      +----------+--------+-------+----------------+-------------------+ +---------+--------+--+--------+-+---------+  Vertebral PSV cm/s 20 EDV cm/s 6 Antegrade  +---------+--------+--+--------+-+---------+  Left Carotid Findings: +----------+--------+-------+--------+----------------------+------------------+             PSV cm/s EDV     Stenosis Plaque Description     Comments                                 cm/s                                                        +----------+--------+-------+--------+----------------------+------------------+  CCA Prox   117      23                                      intimal thickening  +----------+--------+-------+--------+----------------------+------------------+  CCA Distal 58       17                                                          +----------+--------+-------+--------+----------------------+------------------+  ICA Prox   34       11               smooth and                                                                       heterogenous                               +----------+--------+-------+--------+----------------------+------------------+  ICA Distal 75  34                                                          +----------+--------+-------+--------+----------------------+------------------+  ECA        73       10                                                          +----------+--------+-------+--------+----------------------+------------------+  +----------+--------+--------+----------------+-------------------+             PSV cm/s EDV cm/s Describe         Arm Pressure (mmHG)  +----------+--------+--------+----------------+-------------------+  Subclavian 143               Multiphasic, WNL                      +----------+--------+--------+----------------+-------------------+ +---------+--------+--+--------+--+---------+  Vertebral PSV cm/s 63 EDV cm/s 32 Antegrade  +---------+--------+--+--------+--+---------+   Summary: Right Carotid: Velocities in the right ICA are consistent with a 1-39% stenosis. Left Carotid: Velocities in the left ICA are consistent with a 1-39% stenosis.               Heterogenous area of the left subclavian area noted; etiology               unknown. Vertebrals:  Bilateral vertebral arteries demonstrate antegrade flow. Subclavians: Normal flow hemodynamics were seen in bilateral subclavian              arteries. *See table(s) above for measurements and observations.  Electronically signed by Antony Contras MD on 11/30/2021 at 8:21:24 AM.    Final    VAS [place] LOWER EXTREMITY VENOUS (DVT) (ONLY [facility] & [facility])  Result Date: 11/29/2021  Lower Venous DVT Study Patient Name:  SALIH WILLIAMSON  Date of Exam:   11/28/2021 Medical Rec #: [id number]          Accession #:    [id number] Date of Birth: Jul 16, 1961          Patient Gender: M Patient Age:   8 years Exam Location:  [facility] Procedure:      VAS [place] LOWER EXTREMITY VENOUS (DVT) Referring Phys: JOSHUA LONG --------------------------------------------------------------------------------  Indications: Edema, and ulceration.  Risk Factors: Cancer Metastatic non-small cell lung cancer. Comparison Study: No prior study Performing Technologist: Maudry Mayhew MHA, RDMS, RVT, RDCS  Examination Guidelines: A complete evaluation includes B-mode imaging, spectral Doppler, color Doppler, and power Doppler as needed of all accessible portions of each vessel. Bilateral testing is  considered an integral part of a complete examination. Limited examinations for reoccurring indications may be performed as noted. The reflux portion of the exam is performed with the patient in reverse Trendelenburg.  +---------+---------------+---------+-----------+----------+--------------+  RIGHT     Compressibility Phasicity Spontaneity Properties Thrombus Aging  +---------+---------------+---------+-----------+----------+--------------+  CFV       Full            Yes       Yes                                    +---------+---------------+---------+-----------+----------+--------------+  SFJ       Full                                                             +---------+---------------+---------+-----------+----------+--------------+  FV Prox   Full                                                             +---------+---------------+---------+-----------+----------+--------------+  FV Mid    Full                                                             +---------+---------------+---------+-----------+----------+--------------+  FV Distal Full                                                             +---------+---------------+---------+-----------+----------+--------------+  PFV       Full                                                             +---------+---------------+---------+-----------+----------+--------------+  POP       Full            Yes       Yes                                    +---------+---------------+---------+-----------+----------+--------------+  PTV       Full                                                             +---------+---------------+---------+-----------+----------+--------------+  PERO      Full                                                             +---------+---------------+---------+-----------+----------+--------------+  Gastroc   None                      No                     Acute            +---------+---------------+---------+-----------+----------+--------------+   +---------+---------------+---------+-----------+----------+--------------+  LEFT      Compressibility Phasicity Spontaneity Properties Thrombus Aging  +---------+---------------+---------+-----------+----------+--------------+  CFV       Full            Yes       Yes                                    +---------+---------------+---------+-----------+----------+--------------+  SFJ       Full                                                             +---------+---------------+---------+-----------+----------+--------------+  FV Prox   Full                                                             +---------+---------------+---------+-----------+----------+--------------+  FV Mid    Full                                                             +---------+---------------+---------+-----------+----------+--------------+  FV Distal Full                                                             +---------+---------------+---------+-----------+----------+--------------+  PFV       Full                                                             +---------+---------------+---------+-----------+----------+--------------+  POP       Full            Yes       Yes                                    +---------+---------------+---------+-----------+----------+--------------+  PTV       Full                                                             +---------+---------------+---------+-----------+----------+--------------+  PERO      Full                                                             +---------+---------------+---------+-----------+----------+--------------+  Gastroc   None                      No                     Acute           +---------+---------------+---------+-----------+----------+--------------+     Summary: RIGHT: - Findings consistent with acute deep vein thrombosis involving the right gastrocnemius veins. - No  cystic structure found in the popliteal fossa.  LEFT: - Findings consistent with acute deep vein thrombosis involving the left gastrocnemius veins. - No cystic structure found in the popliteal fossa.  *See table(s) above for measurements and observations. Electronically signed by Harold Barban MD on 11/29/2021 at 7:47:26 PM.    Final    ECHOCARDIOGRAM LIMITED  Result Date: 11/28/2021    ECHOCARDIOGRAM LIMITED REPORT   Patient Name:   Christopher Randall Date of Exam: 11/28/2021 Medical Rec #:  [id number]         Height:       69.0 in Accession #:    [id number]        Weight:       203.7 lb Date of Birth:  March 18, 1961         BSA:          2.082 m Patient Age:    66 years          BP:           100/70 mmHg Patient Gender: M                 HR:           115 bpm. Exam Location:  Inpatient Procedure: Limited Echo Indications:    TIA Stroke Abnormal ECG  History:        Patient has prior history of Echocardiogram examinations.                 Stroke; Signs/Symptoms:Altered Mental Status.  Sonographer:    Johny Chess RDCS Referring Phys: [id number] [place]  1. Limited echo: With definity Overall EF preserved distal septal apical and inferior apical hypokinesis No mural apical thormbus seen. FINDINGS  Additional Comments: Limited echo: With definity Overall EF preserved distal septal apical and inferior apical hypokinesis No mural apical thormbus seen. Jenkins Rouge MD Electronically signed by Jenkins Rouge MD Signature Date/Time: 11/28/2021/2:21:40 PM    Final      PHYSICAL EXAM  Temp:  [97.4 F (36.3 C)-98.2 F (36.8 C)] 97.5 F (36.4 C) (12/20 1326) Pulse Rate:  [93-107] 105 (12/20 1326) Resp:  [17-19] 19 (12/20 1326) BP: (117-124)/(72-92) 117/72 (12/20 1326) SpO2:  [94 %-100 %] 96 % (12/20 1326) Weight:  [84.8 kg] 84.8 kg (12/20 0500)  General - Well nourished, well developed, in no apparent distress.   NEURO:  Mental Status: AA&Ox3 Registration 3/3, recall 2/3, able to name 11  animals with 4 feet when asked. Speech/Language: speech is without dysarthria or aphasia.  Naming, repetition, fluency, and comprehension intact.  Cranial Nerves:  II: PERRL. Visual fields full.  III, IV, VI: EOMI. Eyelids elevate symmetrically.  V: Sensation is intact to light touch and symmetrical to face.  VII: Smile is symmetrical.  VIII: hearing intact to voice. IX, X: Phonation is normal.  XII: tongue is midline without fasciculations. Motor: 5/5 strength to all muscle groups tested.  Sensation- Intact to light touch bilaterally. Extinction absent to light touch to DSS.  Coordination: FTN  intact bilaterally.  Gait- deferred    ASSESSMENT/PLAN [name] is a 60 y.o. 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 No tPA given due to outside window, thrombocytopenia.  Stroke service was called to see patient again due to concerns for altered mental status.  On exam, patient is alert and oriented with slight deficits in recall.  He is able to converse easily about his current situation.  No acute neurological changes detected, and previous altered mental status is more likely due to fatigue.  Stroke:  bilateral anterior and posterior punctate/small infarcts consistent with embolic shower embolic secondary to hypercoagulable state from advanced malignancy, libman-sacks endocarditis, or DVT in the setting of PFO MRI  b/l anterior and posterior punctate/small infarcts consistent with embolic shower MRA distal R VA occlusion, right ACA stenosis. Carotid Doppler  unremarkable 2D Echo EF 55%, no LV thrombus, no PFO LE venous doppler b/l distal DVT LDL 119 HgbA1c 5.8 SCDs for VTE prophylaxis No antithrombotic prior to admission, now on clopidogrel 75 mg daily and Eliquis (apixaban) daily.  Ongoing aggressive stroke risk factor management Therapy recommendations:   CIR Disposition:  pending  B/l DVT LE venous doppler showed b/l distal DVT Likely the cause of b/l LE swelling, weeping wound at forelegs  Likely due to hypercoagulable state from advance malignancy On Eliquis  Elevated troponin Cardiology on board EF 55%, no LV thrombus or PFO On plavix per cardiology as long as platelet stable > 50.   Thrombocytopenia  Could be due to chemo therapy Platelet 29->28->31->79->67 On plavix per cardiology as long as platelet stable > 50.  Close montioring  ? Cellulitis/sepsis Leukocytosis, improving WBC 27.3 B/l foreleg swelling, redness with weeping wound, improving Wound consult placed Tachycardia  On cefadroxil  Lung cancer with brain met Oncology following Undergoing chemo SRT for brain mets (b/l frontal lesions) On decadron   Hyperlipidemia Home meds:  none  LDL 119, goal < 70 Now on lipitor 40 Continue statin at discharge  Other Stroke Risk Factors Former Cigarette smoker, quit 8 weeks ago  Other Active Problems Tachycardia    [place] , MSN, AGACNP-BC [facility] See Amion for schedule and pager information 12/08/2021 3:11 PM   ADDEDNDUM : Reviewed MRI scan of the brain done this morning 12/09/2021 which showed numerous foci of acute infarcts involving bilateral cerebellar hemispheres left worse than right, midbrain and bilateral parietal lobes and subacute small infarcts in bilateral frontal lobes.  These appear increased compared with previous MRI from 11/27/2021 and have happened despite patient being on anticoagulation with Eliquis.  Recommend discussion with oncologist on switching Eliquis to alternative anticoagulant.  Recurrent embolic strokes are more likely related to hypercoagulability from his lung cancer.  Marantic endocarditis is possible however less likely.  Discussed with Dr. Letta Pate and answered questions. Antony Contras, MD Medical Director [facility] Pager:  [phone] 12/09/2021 1:47 PM  To contact Stroke Continuity provider, please refer to [email or web address] After hours, contact General Neurology

## 2021-12-08 NOTE — Progress Notes (Signed)
Third attempt to call the floor to contact pt. RN.  No answer from floor, will continue to attempt.

## 2021-12-09 ENCOUNTER — Ambulatory Visit
Admission: RE | Admit: 2021-12-09 | Discharge: 2021-12-09 | Disposition: A | Discharge status: Home / Self Care | Payer: BC Managed Care – PPO | Source: Ambulatory Visit | Attending: Radiation Oncology | Admitting: Radiation Oncology

## 2021-12-09 ENCOUNTER — Inpatient Hospital Stay (HOSPITAL_COMMUNITY): Payer: BC Managed Care – PPO

## 2021-12-09 ENCOUNTER — Ambulatory Visit: Payer: BC Managed Care – PPO

## 2021-12-09 LAB — CBC
HCT: 32.4 % — ABNORMAL LOW (ref 39.0–52.0)
Hemoglobin: 11.2 g/dL — ABNORMAL LOW (ref 13.0–17.0)
MCH: 31.6 pg (ref 26.0–34.0)
MCHC: 34.6 g/dL (ref 30.0–36.0)
MCV: 91.5 fL (ref 80.0–100.0)
Platelets: 65 10*3/uL — ABNORMAL LOW (ref 150–400)
RBC: 3.54 MIL/uL — ABNORMAL LOW (ref 4.22–5.81)
RDW: 18.1 % — ABNORMAL HIGH (ref 11.5–15.5)
WBC: 25.7 10*3/uL — ABNORMAL HIGH (ref 4.0–10.5)
nRBC: 0.5 % — ABNORMAL HIGH (ref 0.0–0.2)

## 2021-12-09 IMAGING — MR MR HEAD W/O CM
4 of 9 series · 18 of 48 positions shown · non-contrast
Comparison: MR head [DATE], MRA head [DATE]

CLINICAL DATA: History of recently diagnosed lung cancer with brain
metastatic disease, undergoing chemotherapy admitted for altered
mental status

EXAM:
MRI HEAD WITHOUT CONTRAST
TECHNIQUE: Multiplanar, multiecho pulse sequences of the brain and surrounding
structures were obtained without intravenous contrast.

[Series 2: DWI · axial · 3.0mm · 0.94mm/px · z∈[-49,+82]mm · 8 of 99 slices shown (1 of 2)]
[im 1/99]
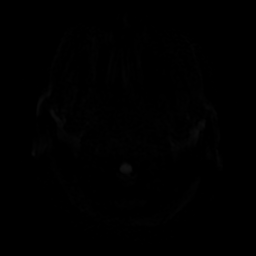
[im 18/99]
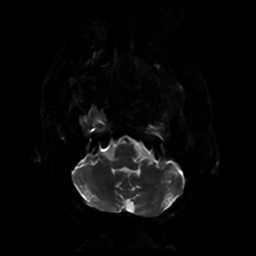
[im 27/99]
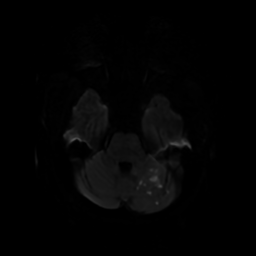
[im 45/99]
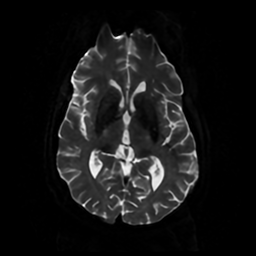
[im 54/99]
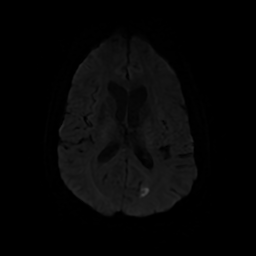
[im 72/99]
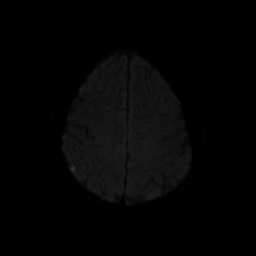
[im 81/99]
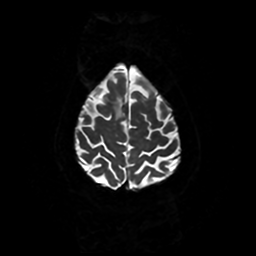
[im 90/99]
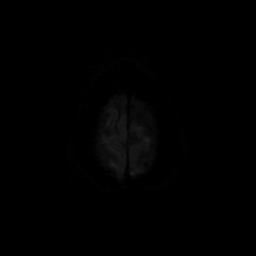

[Series 3: DWI · coronal · 4.0mm · 0.94mm/px · 3 of 72 slices shown (2 of 2)]
[im 9/72]
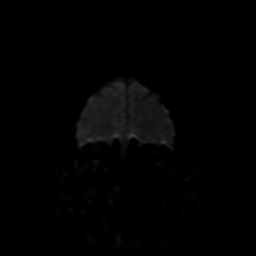
[im 36/72]
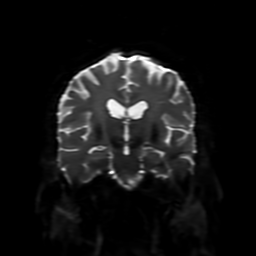
[im 63/72]
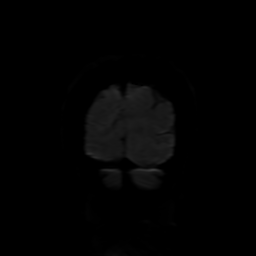

[Series 4: FLAIR · sagittal · 5.0mm · 0.23mm/px · 3 of 24 slices shown (1 of 2)]
[im 1/24]
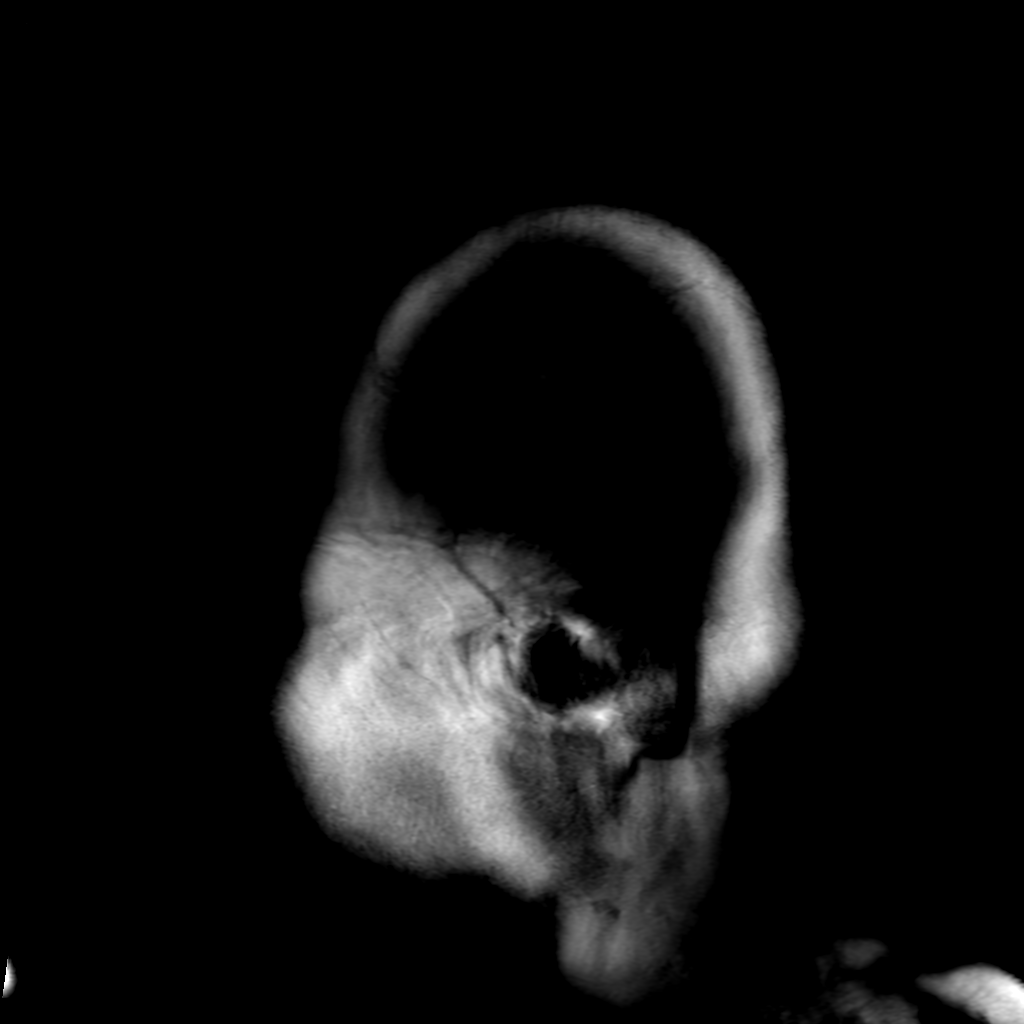
[im 12/24]
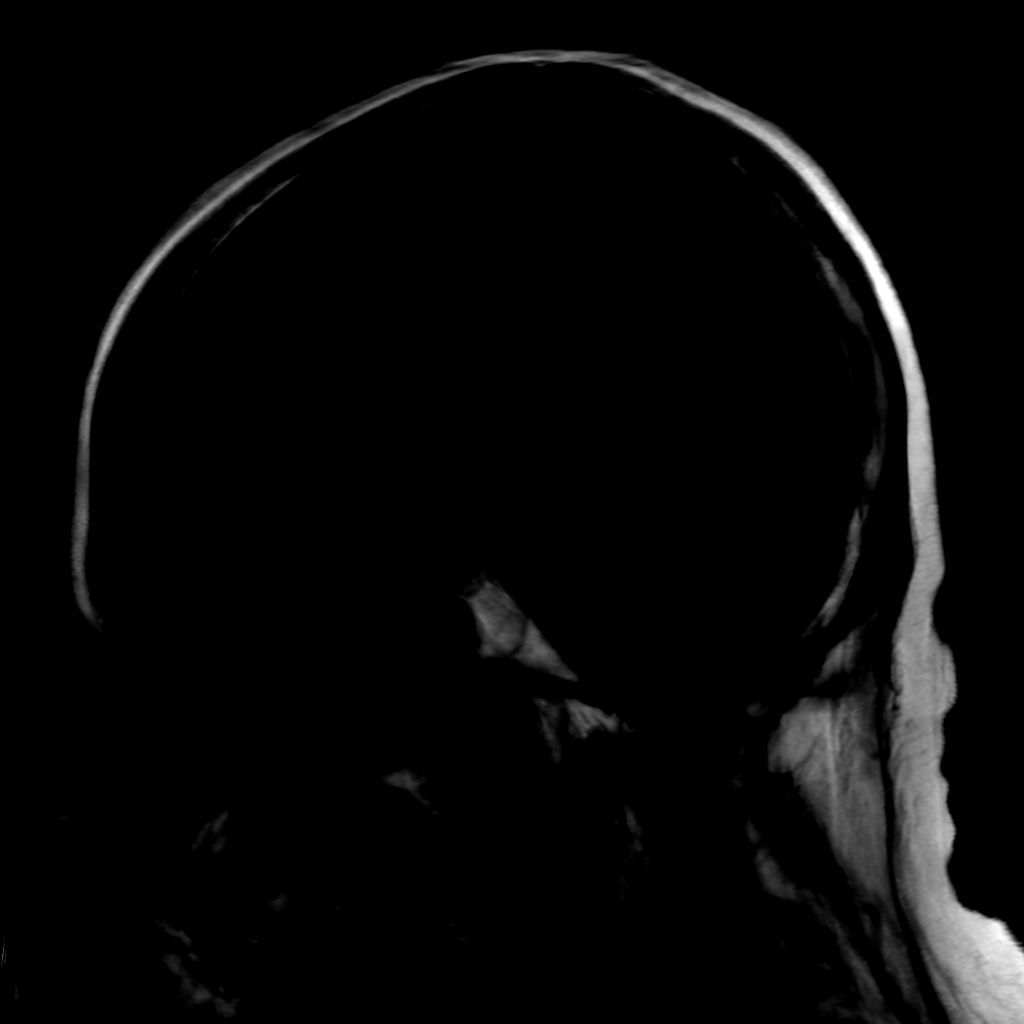
[im 24/24]
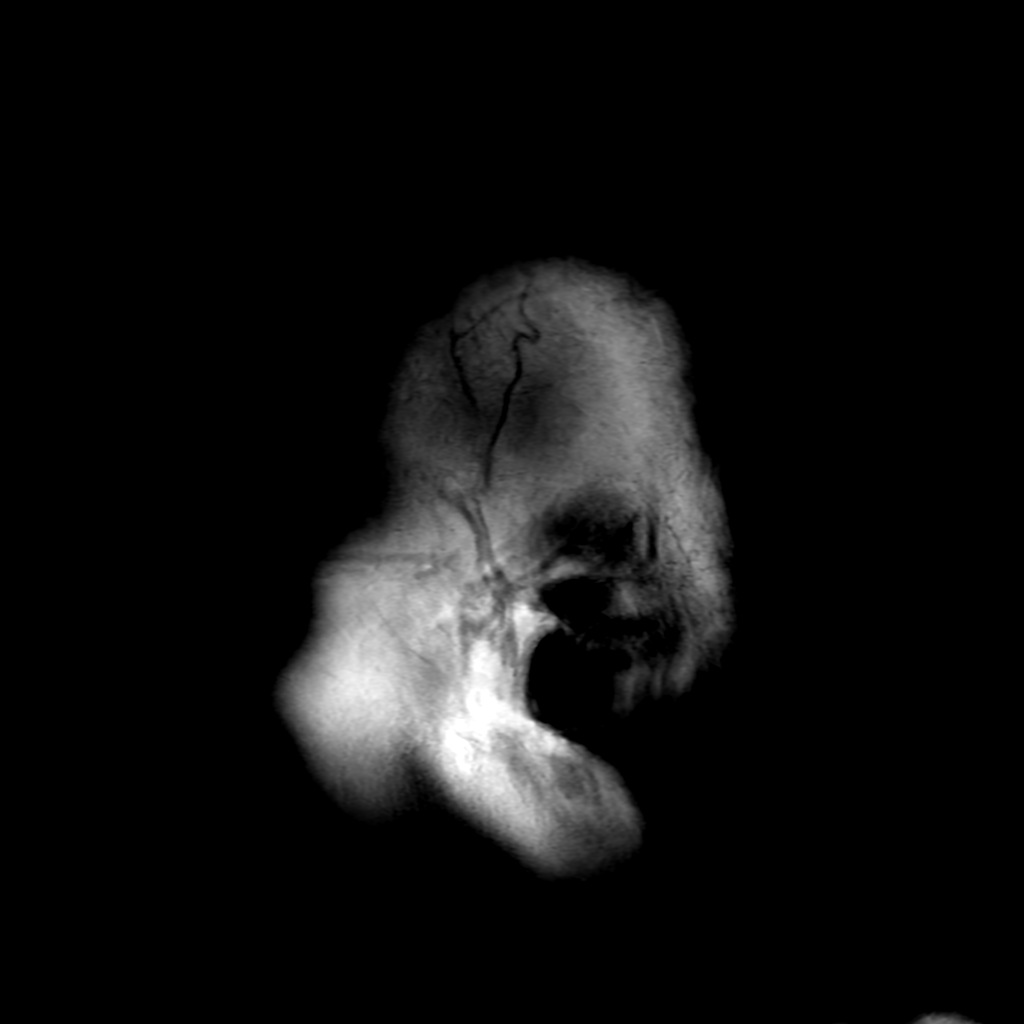

[Series 5: FLAIR · axial · 4.0mm · 0.45mm/px · z∈[-48,+88]mm · 4 of 33 slices shown (2 of 2)]
[im 1/33]
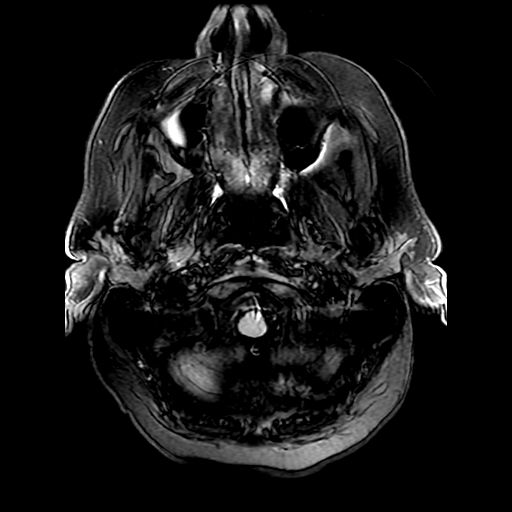
[im 11/33]
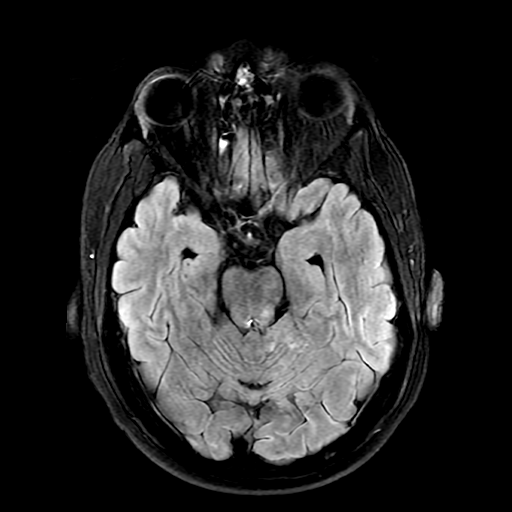
[im 22/33]
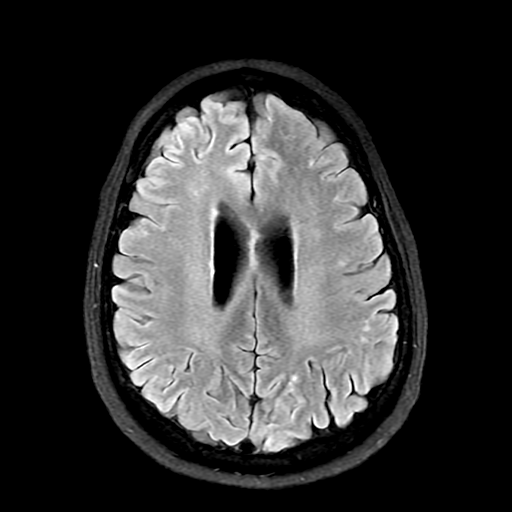
[im 33/33]
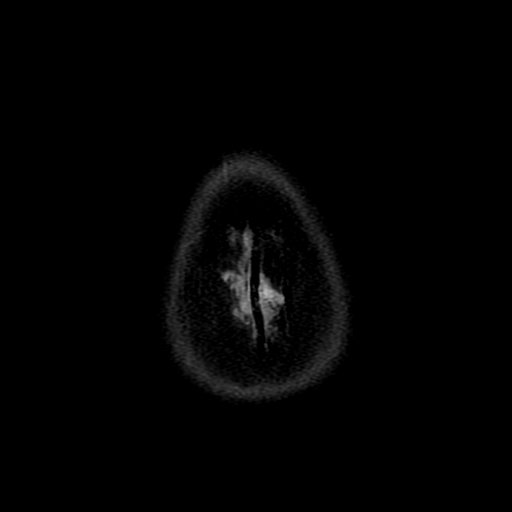

[18 of 48 positions shown; findings below may reference images not displayed]

FINDINGS: The study was terminated before axial T1 images could be obtained.

Brain: There are numerous foci of diffusion restriction with
associated T2/FLAIR signal abnormality in the bilateral cerebellar
hemispheres (left significantly worse than right), left dorsal
midbrain, and bilateral parietal lobes consistent with acute
infarct. Faint foci of diffusion restriction in the bilateral
frontal lobes may reflect small subacute infarcts. There is no
associated hemorrhage.

Again seen is an extra-axial lesion along the right aspect of the
falx near the vertex measuring up to approximately 0.9 cm AP on the
FLAIR sequence, not significantly changed in size when measured on
the FLAIR sequence on the prior study from [DATE] (5-27). There
is mild edema in the underlying parasagittal frontal lobe,
unchanged. The previously seen enhancing lesion in the left
parasagittal frontal lobe is not seen on the current noncontrast
study.

Parenchymal volume is stable. The ventricles are unchanged in size.
Scattered additional foci of FLAIR signal abnormality in the
subcortical and periventricular white matter likely reflects sequela
of mild chronic white matter microangiopathy.

There is no midline shift.

Vascular: Normal flow voids.

Skull and upper cervical spine: Normal marrow signal.

Sinuses/Orbits: The paranasal sinuses are clear. The globes and
orbits are unremarkable.

Other: None.
IMPRESSION: 1. Numerous foci of acute infarct in the bilateral cerebellar
hemispheres (left worse than right), midbrain, and bilateral
parietal lobes, again favored to be embolic in etiology.
2. Probable small subacute infarcts in the bilateral frontal lobes.
3. No significant interval change in size of the extra-axial lesion
along the right aspect of the falx near the vertex when measured on
the FLAIR sequence. The smaller lesion in the left parasagittal
frontal lobe is not identified on this noncontrast study.

## 2021-12-09 MED ORDER — ENOXAPARIN SODIUM 100 MG/ML IJ SOSY
1.0000 mg/kg | PREFILLED_SYRINGE | Freq: Two times a day (BID) | INTRAMUSCULAR | Status: DC
  Administered 2021-12-09 – 2021-12-10 (×3): 85 mg via SUBCUTANEOUS
  Filled 2021-12-09 (×5): qty 0.85

## 2021-12-09 NOTE — Progress Notes (Signed)
Physical Therapy Session Note  Patient Details  Name: Christopher Randall MRN: [id number] Date of Birth: 03/31/1961  Today's Date: 12/09/2021 PT Individual Time: 0805-0850 PT Individual Time Calculation (min): 45 min   Short Term Goals: Week 1:  PT Short Term Goal 1 (Week 1): STGs + LTG due to ELOS.  Skilled Therapeutic Interventions/Progress Updates:     Session 1   Pt received sitting in WC and agreeable to PT. PT assessed VS. HR 119, B 116/80. SpO2 96%. Informed by NT that transport will be here shourtly to take pt to MRI  Stand pivot transfer to bed with CGA and RW. Sit<>supine with supervision assist. Supine therex: SAQ, SLR, hip abduction, ankle pumps, each performed x 10 BLE with cues for full ROM. Pt requesting to sit EOB.   Pt performed 5 time sit<>stand (5xSTS): 15 sec (>15 sec indicates increased fall risk) with puch from bd with RUE and LUE supported on RW. SpO2 97-98% HR 128 with 1 min rst break to reduce to 120. PT lowered O2 to 3L/min standing tolerance x 30sec with no change in SpO2, breath rate increased to 25per min, requiring max cues for puresed lip breathing.  Gait in room x 70ft with CGA and RW cues for safety with AD management in turn. Once returned to EOB pt required max cues for pursed lip breathing to drop respiratory rate back to <20. Pt O2 sats remained >96% on 3L via nasal canula   Pt reporting increased fatigue and need for rest and returned to bed per pt requestin.    Session 2.   Attempted to see pt for PT treatment, pt off unit for MRI. Will re-attempt at later time/date.  Attempted to make up missed PT session in PM but pt reports fatigue from transport to and from oncology at [facility] long. Left in room with family present seated in [place].      Therapy Documentation Precautions:  Precautions Precautions: Fall Precaution Comments: L elbow septic bursitis, B heel wounds, monitor SpO2, decreased cognition Restrictions Weight Bearing Restrictions:  No General: PT Amount of Missed Time (min): 75 Minutes PT Missed Treatment Reason: Patient fatigue Vital Signs: Therapy Vitals Pulse Rate: (!) 120 Resp: (!) 25 BP: 116/80 Patient Position (if appropriate): Sitting Oxygen Therapy SpO2: 96 % O2 Device: Nasal Cannula Pain:   denies  Therapy/Group: Individual Therapy  Lorie Phenix 12/09/2021, 9:07 AM

## 2021-12-09 NOTE — Progress Notes (Addendum)
Discussed MRI findings with Dr Leonie Man, new left cerebellar and midbrain infarcts that do not appear to be symptomatic but signal a failure of apixaban.  Will ask Onc to advise on alternative anticoagulants in setting of hypercoagulable state related to metastatic NSCLC.    Long discussion with pt and wife regarding new infarcts, anticoagulation and wound care.  Also discussed with Dr Mickeal Skinner and Leonie Man use of lovenox in setting of apixaban failure in hypercoagulable state associated with NSCLCa.  Will consult pharmacy for enoxaparin dosing (BID)  Dr Mickeal Skinner plans to see pt on 12/29 in office for 1 hr visit , [place]              Will ask for [facility] consult, may use light ACE wrap given pt's difficulty in maintaining elevation of lower extremities   Wife and pt still discussing home 12/23 vs 12/28  Charlett Blake M.D. [facility] Fellow Am Acad of Phys Med and Rehab Diplomate Am Board of Electrodiagnostic Med Fellow Am Board of Interventional Pain

## 2021-12-09 NOTE — Progress Notes (Signed)
ANTICOAGULATION CONSULT NOTE - Initial Consult  Pharmacy Consult for transition from apixaban to lovenox Indication: bilateral DVT  Allergies  Allergen Reactions   Iopamidol Hives and Itching    Isovue break through with prep, Hives and Itching  Patient had PET?CT scan with 125 mls of Isovue-300. Noted to have two raised itchy hives which self-resolved. Patient had PET/CT scan with contrast on 05/27/2014 while on prednisone prep (3 doses of 50 mg-13 hour prep) and noted to have two hives which self -resolved without treatment   Iodinated Diagnostic Agents Rash   Iodine-131 Rash    Patient Measurements: Weight: 84.8 kg (186 lb 15.2 oz) Lovenox Dosing Weight: 84.8 kg  Vital Signs: Temp: 97.5 F (36.4 C) (12/21 1356) Temp Source: Oral (12/21 1356) BP: 115/83 (12/21 1356) Pulse Rate: 111 (12/21 1356)  Labs: Recent Labs    12/07/21 0654 12/08/21 0550 12/09/21 0518  HGB 11.4* 11.1* 11.2*  HCT 32.5* 31.4* 32.4*  PLT 61* 56* 65*  CREATININE 0.88  --   --     Estimated Creatinine Clearance: 90.4 mL/min (by C-G formula based on SCr of 0.88 mg/dL).   Medical History: Past Medical History:  Diagnosis Date   Abnormal TSH 09/28/2017   Anemia 11/27/2021   Anxiety 07/03/2012   Depression    Difficulty sleeping 09/28/2017   Examination of participant in clinical trial 09/11/2012   History of immunotherapy 02/23/2016   Melanoma of skin 10/26/2011   Nicotine dependence 07/17/2015   Smoker    STEMI (ST elevation myocardial infarction) ([place]) 11/27/2021    Assessment: Transition from apixaban to lovenox for B/L DVT secondary to hypercoagulable state in the setting of NSCLCa failing apixaban therapy. Oncology and Attending are aware of thrombocytopenia. Thrombocytes appear to be trending upward over the past 4 days (46,000 to 65,000)  Goal of Therapy:   Monitor platelets by anticoagulation protocol: Yes   Plan:  Lovenox 1 mg/kg (85 mg) [place] q12h. This will start 12 hours from  last apixaban dose given (23:00 on 12/09/2021)  Monitor for signs of bleeding. Monitor and trend thrombocytes  Davan Hark BS, PharmD, BCPS Clinical Pharmacist 12/09/2021,4:45 PM

## 2021-12-09 NOTE — Patient Care Conference (Signed)
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 Alysia Penna Social Worker Present: Erlene Quan, BSW Nurse Present: Dorien Chihuahua, RN PT Present: Barrie Folk, PT OT Present: Providence Lanius, OT SLP Present: Sherren Kerns, SLP PPS Coordinator present : Gunnar Fusi, SLP     Current Status/Progress Goal Weekly Team Focus  Bowel/Bladder   Continent of B/B. LBM 11/2021  Maintain Continence.      Swallow/Nutrition/ Hydration             ADL's   CGA to min A ambulatory bathroom transfers no AD, bathing shower level, LBD, UB dressing, grooming in stance; fatigues easily/cognitive and activity tolerance decline since eval  S bathroom transfers, mod I overall  transfer/balance/self-care retraining, pt/family education, activity tolerance   Mobility             Communication             Safety/Cognition/ Behavioral Observations  mod A - limited by fatigue and pain  supervision  attention, awareness, memory, problem solving   Pain   C/O Generalized pain, and  to BLE (cellulitis) 7/10. Managed with vicodin/tylenol  Pain <3/10  Assess Q shift and prn   Skin   Skin tears wnd BLE cellulitis.  Change dressing as  per provider orders. Promote wound healing.  Assess skin Qshift and prn     Discharge Planning:  Discharging home with spouse to provide assistance   Team Discussion: Hypercoag state with cancer. Noted increased in confusion; neuro consulted, felt symptoms related to fatigue. Strained family dynamic stress patient. Sleeping better as insomnia addressed. Continent of bowel and bladder. Wound care ongoing. Pain addressed.  Patient on target to meet rehab goals: no, patient limited by fatigue, pain, and daily radiation treatments. Currently needs cues to maintain attention to tasks. Requires CGA for sit - stand transfers. Working on attention, problem solving,  and awareness. Overall min assist with goals for discharge set for supervision level.  *See Care Plan and progress notes for long and short-term goals.   Revisions to Treatment Plan:  Downgraded goals for PT   Teaching Needs: Safety, wound care, medication management, transfers, toileting, etc.   Current Barriers to Discharge: Decreased caregiver support and Home enviroment access/layout  Possible Resolutions to Barriers: Transfer to [facility]/oncology service reviewed Family education with wife FMLA for wife reviewed Hospice care referral available     Medical Summary               I attest that I was present, lead the team conference, and concur with the assessment and plan of the team.   Dorien Chihuahua B 12/09/2021, 1:53 PM

## 2021-12-09 NOTE — Progress Notes (Signed)
Occupational Therapy Session Note  Patient Details  Name: Christopher Randall MRN: [id number] Date of Birth: 1961-01-13  Today's Date: 12/09/2021 OT Individual Time: 1133-1202 OT Individual Time Calculation (min): 29 min  and Today's Date: 12/09/2021 OT Missed Time: 60 Minutes Missed Time Reason: CT/MRI   Short Term Goals: Week 1:  OT Short Term Goal 1 (Week 1): STG = LTG 2/2 ELOS  Skilled Therapeutic Interventions/Progress Updates:    Session 1 attempt: Attempted to see pt at scheduled therapy time, not in room and out for MRI at this time.   Session 2 [phone]): Pt received seated in recliner with son/DIL and LPN present, agreeable to therapy with encouragement. Session focus on self-care retraining, activity tolerance, transfer retraining in prep for improved ADL/IADL/func mobility performance + decreased caregiver burden.  Donned shirt with S, but needed assist to manage [place] line, glasses, and untying w/c. Donned shorts with mod A to thread BLE due to fatigue. Required increased time due to significant fatigue, DOE, and requiring consistent cues to stay on task due to STM/poor sustained attention deficits. With extensive encouragement from family, ambulated > gym next door with RW and CGA with cues for sequencing turn and safe RW management.  Able to complete 1 min then 30 seconds at level 4/10 resistance. Reported 9/10 on modified RPE post activity and requesting to be wheeled back to room. SatO2 at 98+% on 4L via [place] and HR at 120 - 124 bpm throughout session.  Pt able complete short ambulatory transfer same manner as before > recliner. Pt left seated in recliner with BLE elevated, safety belt placed, and family present with call bell in reach, and all immediate needs met.    Therapy Documentation Precautions:  Precautions Precautions: Fall Precaution Comments: L elbow septic bursitis, B heel wounds, monitor SpO2, decreased cognition Restrictions Weight Bearing Restrictions:  No  Pain: no c/o   ADL: See Care Tool for more details.  Therapy/Group: Individual Therapy  Volanda Napoleon MS, OTR/L  12/09/2021, 6:56 AM

## 2021-12-09 NOTE — Progress Notes (Signed)
PROGRESS NOTE   Subjective/Complaints:  Per PT, pt with reduced O2 requirement but HR is up today Son and daughter in law at bedside  ROS: Patient denies CP, SOB, N/V/D   Objective:   No results found. Recent Labs    12/08/21 0550 12/09/21 0518  WBC 27.3* 25.7*  HGB 11.1* 11.2*  HCT 31.4* 32.4*  PLT 56* 65*    Recent Labs    12/07/21 0654  NA 126*  K 3.6  CL 88*  CO2 29  GLUCOSE 100*  BUN 24*  CREATININE 0.88  CALCIUM 7.9*     Intake/Output Summary (Last 24 hours) at 12/09/2021 1250 Last data filed at 12/09/2021 0900 Gross per 24 hour  Intake 120 ml  Output --  Net 120 ml      Pressure Injury 12/04/21 Arm Lower;Posterior;Proximal;Right Stage 1 -  Intact skin with non-blanchable redness of a localized area usually over a bony prominence. reddness to the right elbow (Active)  12/04/21 1710  Location: Arm  Location Orientation: Lower;Posterior;Proximal;Right  Staging: Stage 1 -  Intact skin with non-blanchable redness of a localized area usually over a bony prominence.  Wound Description (Comments): reddness to the right elbow  Present on Admission: Yes    Physical Exam: Vital Signs Blood pressure 118/85, pulse (!) 119, temperature 97.6 F (36.4 C), temperature source Oral, resp. rate 20, weight 84.8 kg, SpO2 98 %.  General: No acute distress Mood and affect are appropriate Heart: tachycardia nl rhythm no rubs murmurs or extra sounds Lungs: Clear to auscultation, breathing unlabored, no rales or wheezes Abdomen: Positive bowel sounds, soft nontender to palpation, nondistended Extremities: No clubbing, cyanosis, or edema Skin: No evidence of breakdown, no evidence of rash  Skin: ACE wrap bilateral legs, foot/ankle wounds, left eblow looks much better with decreased swelling as well. Neurologic:Alert , oriented person, hospital, year  , month but not day or date Cranial nerves II through XII  intact, motor strength is 5/5 in bilateral deltoid, bicep, tricep, grip, hip flexor, knee extensors, ankle dorsiflexor and plantar flexor Sensory exam normal sensation to light touch  in bilateral upper and lower extremities  Musculoskeletal: Full range of motion in all 4 extremities. No joint swelling    Assessment/Plan: 1. Functional deficits which require 3+ hours per day of interdisciplinary therapy in a comprehensive inpatient rehab setting. Physiatrist is providing close team supervision and 24 hour management of active medical problems listed below. Physiatrist and rehab team continue to assess barriers to discharge/monitor patient progress toward functional and medical goals  Care Tool:  Bathing    Body parts bathed by patient: Face, Right arm, Left arm, Chest, Abdomen, Front perineal area, Buttocks, Right upper leg, Left upper leg, Right lower leg, Left lower leg         Bathing assist Assist Level: Contact Guard/Touching assist (stance)     Upper Body Dressing/Undressing Upper body dressing   What is the patient wearing?: Pull over shirt    Upper body assist Assist Level: Supervision/Verbal cueing    Lower Body Dressing/Undressing Lower body dressing      What is the patient wearing?: Pants     Lower body assist Assist for  lower body dressing: Moderate Assistance - Patient 50 - 74%     Toileting Toileting    Toileting assist Assist for toileting: Minimal Assistance - Patient > 75%     Transfers Chair/bed transfer  Transfers assist     Chair/bed transfer assist level: Minimal Assistance - Patient > 75%     Locomotion Ambulation   Ambulation assist      Assist level: Contact Guard/Touching assist Assistive device: Walker-rolling Max distance: 50   Walk 10 feet activity   Assist     Assist level: Contact Guard/Touching assist Assistive device: Walker-rolling   Walk 50 feet activity   Assist    Assist level: Contact Guard/Touching  assist Assistive device: Walker-rolling    Walk 150 feet activity   Assist Walk 150 feet activity did not occur: Safety/medical concerns         Walk 10 feet on uneven surface  activity   Assist Walk 10 feet on uneven surfaces activity did not occur: Safety/medical concerns         Wheelchair     Assist Is the patient using a wheelchair?: Yes Type of Wheelchair: Manual    Wheelchair assist level: Contact Guard/Touching assist Max wheelchair distance: 40    Wheelchair 50 feet with 2 turns activity    Assist    Wheelchair 50 feet with 2 turns activity did not occur:  (per PT documentation)   Assist Level: Minimal Assistance - Patient > 75%   Wheelchair 150 feet activity     Assist  Wheelchair 150 feet activity did not occur:  (per PT documentation)   Assist Level: Maximal Assistance - Patient 25 - 49%   Blood pressure 118/85, pulse (!) 119, temperature 97.6 F (36.4 C), temperature source Oral, resp. rate 20, weight 84.8 kg, SpO2 98 %.  Medical Problem List and Plan: 1. 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 Order palliative consult  Team conference today please see physician documentation under team conference tab, met with team  to discuss problems,progress, and goals. Formulized individual treatment plan based on medical history, underlying problem and comorbidities. If pt discharges on 12/23 would be at [place] A level If pt discharges 12/28 would be at [facility] level Would set up HHPT, OT,SLP, RN Would also qualify for hospice per rad onc  Son would prefer pt to stay until the 28th but pt would like to discuss this with his wife prior to decision 2.  Antithrombotics: -DVT/anticoagulation:  Pharmaceutical: Other (comment)--Eliquis             -antiplatelet therapy: N/A 3.  Pain Management: Tylenol 4. Mood: pt struggling with diagnosis and end of life thoughts -would like for him to speak with neuropsychology while he's here -palliative care also would be appropriate.  -remeron trial : reduced to 7.5 mg due to concerns about sedation  Anxiety -on klonopin at reduced dose HS             -antipsychotic agents: N/A 12/19 pt seemed pretty alert today  -will add nicotine patch 5. Neuropsych: This patient is not fully capable of making decisions on his own behalf. 6. Skin/Wound Care:  Routine pressure relief measures             --Monitor elbow/BLE for healing                          -  added warm moist compress to elbow bid, otherwise wrap with kerlex             -might benefit from a darco-type shoe for gait given his heel wounds  12/19 left elbow much improved today 7. Fluids/Electrolytes/Severe protein deficient malutrition: Monitor I/O. Check CMET in am.              -add protein supplement             -continue remeron for mood/appetite  --sporadic right now 8. Metastatic no small cell lung cancer: Now on oxygen--using 3-4 liters. Will change order to allow O2 rate adjustment based on sats             --chemo/immunotherapy on hold             --  starting daily XRT starting today,Monday. As discussed with son, this will likely contribute to fatigue level  9. Urinary retention?:  Has been having difficulty voiding since July. CT showed thickened bladder             --ua/ucx negative             --monitor voiding with bladder scan/PVR checks.  10. Septic olecranon bursitis: On Duricef 1000 mg bid for 2-3 weeks --Will need follow up with ID on 11/28 for input to finalize course of treatment.  -local care, arm looks better today 11. Leukocytosis: Felt to be steroid effect.  12. Hyponatremia: Na continues to fluctuate from 120 at admission-->126-->124, urine Na 46 on 12/12--->Na+ 126 12/19 13. Anemia/Thrombocytopenia: Felt to be due to chemo. Monitor for signs of  bleeding.  -daily CBC- stable/improving Hgb-->11.4 15. Candida esophagitis: On diflucan. Will schedule MMW qid as this was effective at home. 16. Leukocytosis: steadily falling  30.3 12/19, 27.3 on 12/20  17.  Tachycardia EF is nl, did have similar tachy during ECHO, last EKG 12/12 showed sinus tach and inf infarct, no chest pain, 12/21 CBC anemia is stable Last BMET 12/19 had mildly increased BUN   LOS: 5 days A FACE TO FACE EVALUATION WAS PERFORMED  Charlett Blake 12/09/2021, 12:50 PM

## 2021-12-10 ENCOUNTER — Ambulatory Visit: Payer: BC Managed Care – PPO

## 2021-12-10 ENCOUNTER — Encounter: Payer: Self-pay | Admitting: Hematology

## 2021-12-10 ENCOUNTER — Inpatient Hospital Stay (HOSPITAL_COMMUNITY): Payer: BC Managed Care – PPO

## 2021-12-10 ENCOUNTER — Other Ambulatory Visit: Payer: BC Managed Care – PPO

## 2021-12-10 ENCOUNTER — Ambulatory Visit
Admission: RE | Admit: 2021-12-10 | Discharge: 2021-12-10 | Disposition: A | Discharge status: Home / Self Care | Payer: BC Managed Care – PPO | Source: Ambulatory Visit | Attending: Radiation Oncology | Admitting: Radiation Oncology

## 2021-12-10 DIAGNOSIS — Z87891 Personal history of nicotine dependence: Secondary | ICD-10-CM

## 2021-12-10 DIAGNOSIS — D696 Thrombocytopenia, unspecified: Secondary | ICD-10-CM

## 2021-12-10 DIAGNOSIS — C7931 Secondary malignant neoplasm of brain: Secondary | ICD-10-CM

## 2021-12-10 DIAGNOSIS — Z79899 Other long term (current) drug therapy: Secondary | ICD-10-CM

## 2021-12-10 DIAGNOSIS — I739 Peripheral vascular disease, unspecified: Secondary | ICD-10-CM

## 2021-12-10 DIAGNOSIS — Z7901 Long term (current) use of anticoagulants: Secondary | ICD-10-CM

## 2021-12-10 DIAGNOSIS — I82493 Acute embolism and thrombosis of other specified deep vein of lower extremity, bilateral: Secondary | ICD-10-CM

## 2021-12-10 DIAGNOSIS — L03116 Cellulitis of left lower limb: Secondary | ICD-10-CM

## 2021-12-10 DIAGNOSIS — C3490 Malignant neoplasm of unspecified part of unspecified bronchus or lung: Secondary | ICD-10-CM

## 2021-12-10 DIAGNOSIS — Z8673 Personal history of transient ischemic attack (TIA), and cerebral infarction without residual deficits: Secondary | ICD-10-CM

## 2021-12-10 DIAGNOSIS — Z7902 Long term (current) use of antithrombotics/antiplatelets: Secondary | ICD-10-CM

## 2021-12-10 DIAGNOSIS — L03115 Cellulitis of right lower limb: Secondary | ICD-10-CM

## 2021-12-10 LAB — CBC
HCT: 35.8 % — ABNORMAL LOW (ref 39.0–52.0)
Hemoglobin: 11.8 g/dL — ABNORMAL LOW (ref 13.0–17.0)
MCH: 31.1 pg (ref 26.0–34.0)
MCHC: 33 g/dL (ref 30.0–36.0)
MCV: 94.2 fL (ref 80.0–100.0)
Platelets: 61 10*3/uL — ABNORMAL LOW (ref 150–400)
RBC: 3.8 MIL/uL — ABNORMAL LOW (ref 4.22–5.81)
RDW: 18.5 % — ABNORMAL HIGH (ref 11.5–15.5)
WBC: 28.9 10*3/uL — ABNORMAL HIGH (ref 4.0–10.5)
nRBC: 0.7 % — ABNORMAL HIGH (ref 0.0–0.2)

## 2021-12-10 MED ORDER — ACETAMINOPHEN 325 MG PO TABS
325.0000 mg | ORAL_TABLET | ORAL | Status: AC | PRN
Start: 2021-12-10 — End: ?

## 2021-12-10 NOTE — Progress Notes (Signed)
Nutrition Follow-up  DOCUMENTATION CODES:   Not applicable  INTERVENTION:   Encourage good PO intake Continue Ensure Enlive po BID, each supplement provides 350 kcal and 20 grams of protein Continue 30 ml ProSource Plus BID, each supplement provides 100 kcals and 15 grams protein.   NUTRITION DIAGNOSIS:   Increased nutrient needs related to cancer and cancer related treatments as evidenced by estimated needs. - Ongoing  GOAL:   Patient will meet greater than or equal to 90% of their needs - Ongoing   MONITOR:   PO intake, Supplement acceptance, Skin, Weight trends  REASON FOR ASSESSMENT:   Malnutrition Screening Tool    ASSESSMENT:   60 yo male admitted to [facility] with functional deficits post multiple CVAs.  Pt with dx of NSCLC with mets to brain on 09/2021.  Family at bedside. Pt resting in chair.   Family reports that pt is eating very little. Reports that he is getting supplements but is not drinking a lot of them.   Per EMR, pt intake includes: 12/20: Breakfast 25%, Dinner 0% 12/21: Breakfast 0%, Lunch 0%, Dinner 50%  RD visit cut short - pt about to be transferred.  Medications reviewed and include: Folic acid, Lasix, Decadron, Remeron, Protonix, Miralax, Vitamin B12 Labs reviewed   NUTRITION - FOCUSED PHYSICAL EXAM:  Flowsheet Row Most Recent Value  Orbital Region No depletion  Upper Arm Region No depletion  Thoracic and Lumbar Region No depletion  Buccal Region No depletion  Temple Region No depletion  Clavicle Bone Region No depletion  Clavicle and Acromion Bone Region No depletion  Scapular Bone Region No depletion  Dorsal Hand Mild depletion  Patellar Region No depletion  Anterior Thigh Region No depletion  Posterior Calf Region Unable to assess  [wrapped]  Edema (RD Assessment) Mild  Hair Reviewed  Eyes Reviewed  Mouth Reviewed  Skin Reviewed  Nails Reviewed        Diet Order:   Diet Order             Diet regular  Room service appropriate? Yes; Fluid consistency: Thin  Diet effective now                   EDUCATION NEEDS:   Not appropriate for education at this time  Skin:  Skin Assessment: Skin Integrity Issues: Skin Integrity Issues:: Other (Comment) Other: b/l leg wounds with open areas from DVT  Last BM:  12/09/2021  Height:   Ht Readings from Last 1 Encounters:  11/28/21 5\' 9"  (1.753 m)    Weight:   Wt Readings from Last 1 Encounters:  12/10/21 85.1 kg    BMI:  Body mass index is 27.71 kg/m.  Estimated Nutritional Needs:   Kcal:  4920-1007 kcals  Protein:  135-160 g  Fluid:  >/= 2L   Christopher Randall, RD, LDN Clinical Dietitian See [facility] for contact information.

## 2021-12-10 NOTE — Progress Notes (Signed)
ABI has been completed.   Results can be found under chart review under CV PROC. 12/10/2021 4:59 PM Christopher Randall RVT, RDMS

## 2021-12-10 NOTE — Progress Notes (Signed)
Physical Therapy Session Note ° °Patient Details  °Name: Christopher Randall °MRN: [id number] °Date of Birth: 05/28/1961 ° °Today's Date: 12/10/2021 °PT Individual Time: 0802-0845 °PT Individual Time Calculation (min): 43 min  ° °Short Term Goals: °Week 1:  PT Short Term Goal 1 (Week 1): STGs + LTG due to ELOS. ° ° °Skilled Therapeutic Interventions/Progress Updates:  ° °Pt received sitting in EOB and agreeable to PT. Pt performed stand pivot transfer to WC with CGA and RW. Noted pain in the LLE 6/10, aching. RN notified. Pt transported to reahb gym in WC. Once in gym, PT noted blood on floor. Bandage applied to 2nd toe on the RLE and RN notified. Gait training with CGA and RW 2x 50ft; prolonged therapeutic rest break between bouts due to SOB. PT assessed SpO2 with prolonged wait for proper assessment due to poor perfusion through digits. Once read, O2 note dto be 87% and required 30 sec of additional pursed lip breathing to return to >90% HR remained 118 and above. Patient returned to room and left sitting in WC with call bell in reach and all needs met.   ° ° °   ° °Therapy Documentation °Precautions:  °Precautions °Precautions: Fall °Precaution Comments: L elbow septic bursitis, B heel wounds, monitor SpO2, decreased cognition °Restrictions °Weight Bearing Restrictions: No ° ° °Therapy/Group: Individual Therapy ° ° E  °12/10/2021, 9:05 AM  °

## 2021-12-10 NOTE — Consult Note (Addendum)
CONSULT NOTE  MRN : [id number]  Reason for Consult: B DVT gastrocnemius, B LE tissue Referring Physician: Kirsetins  History of Present Illness: 60 y/o male with history of melanoma, Lung cancer metastatic to brain, embolic stroke, anxiety/depression, diagnosis of NSCLC with mets to the brain10/21/22, reports of hallucinations w/headaches since thanksgiving and recent initiation of chemo/immunotherapy/XRT. He was unable to proceed with full dose chemo due to thrombocytopenia.  He was admitted on 11/27/21 from Hem/onc office with edema BLE with cellulitis and drainage as well as 3-4 days.  We have been asked to see this patient for DVT's and LE tissue loss.  He has had leg swelling since 10/20 with weeping.  Since his admission he has had increased tissue loss and darkening skin discoloration left > right LE.  Treatment has been Silvadene and ace wraps.   He is on Lovenox for anticoagulation.    He has pain on the left > right and is still ambulatory for very short distances.       Current Facility-Administered Medications  Medication Dose Route Frequency Provider Last Rate Last Admin   (feeding supplement) PROSource Plus liquid 30 mL  30 mL Oral BID BM Love, Ivan Anchors, PA-C   30 mL at 12/09/21 1602   acetaminophen (TYLENOL) tablet 325-650 mg  325-650 mg Oral Q4H PRN Bary Leriche, PA-C   650 mg at 12/09/21 0544   alum & mag hydroxide-simeth (MAALOX/MYLANTA) 200-200-20 MG/5ML suspension 30 mL  30 mL Oral Q4H PRN Love, Pamela S, PA-C       atorvastatin (LIPITOR) tablet 40 mg  40 mg Oral Daily Reesa Chew S, PA-C   40 mg at 12/09/21 1112   bisacodyl (DULCOLAX) suppository 10 mg  10 mg Rectal Daily PRN Reesa Chew S, PA-C       cefadroxil (DURICEF) capsule 1,000 mg  1,000 mg Oral BID Love, Pamela S, PA-C   1,000 mg at 12/09/21 2041   Chlorhexidine Gluconate Cloth 2 % PADS 6 each  6 each Topical Daily Bary Leriche, PA-C   6 each at 12/09/21 1114   clonazePAM (KLONOPIN) disintegrating tablet  0.25 mg  0.25 mg Oral BID PRN Charlett Blake, MD   0.25 mg at 12/09/21 2039   clopidogrel (PLAVIX) tablet 75 mg  75 mg Oral Daily Bary Leriche, PA-C   75 mg at 12/09/21 1112   dexamethasone (DECADRON) tablet 1 mg  1 mg Oral Q breakfast Love, Pamela S, PA-C   1 mg at 12/09/21 1112   diphenhydrAMINE (BENADRYL) 12.5 MG/5ML elixir 12.5-25 mg  12.5-25 mg Oral Q6H PRN Love, Pamela S, PA-C       enoxaparin (LOVENOX) injection 85 mg  1 mg/kg Subcutaneous Q12H Reome, Earle J, RPH   85 mg at 12/09/21 2226   feeding supplement (ENSURE ENLIVE / ENSURE PLUS) liquid 237 mL  237 mL Oral BID BM Charlett Blake, MD   237 mL at 12/09/21 1602   fluconazole (DIFLUCAN) tablet 100 mg  100 mg Oral Daily Bary Leriche, PA-C   100 mg at 19/37/90 2409   folic acid (FOLVITE) tablet 1 mg  1 mg Oral Daily Bary Leriche, PA-C   1 mg at 12/09/21 1112   furosemide (LASIX) tablet 40 mg  40 mg Oral Daily Bary Leriche, PA-C   40 mg at 12/09/21 1112   guaiFENesin (MUCINEX) 12 hr tablet 1,200 mg  1,200 mg Oral BID Love, Pamela S, PA-C   1,200 mg at  12/09/21 2039   guaiFENesin-dextromethorphan (ROBITUSSIN DM) 100-10 MG/5ML syrup 5-10 mL  5-10 mL Oral Q6H PRN Love, Pamela S, PA-C       HYDROcodone-acetaminophen (NORCO/VICODIN) 5-325 MG per tablet 1-2 tablet  1-2 tablet Oral Q4H PRN Bary Leriche, PA-C   1 tablet at 12/10/21 0404   ipratropium-albuterol (DUONEB) 0.5-2.5 (3) MG/3ML nebulizer solution 3 mL  3 mL Nebulization Q6H PRN Bary Leriche, PA-C   3 mL at 12/09/21 1748   lidocaine (XYLOCAINE) 2 % jelly   Topical PRN Love, Pamela S, PA-C       magic mouthwash w/lidocaine  5 mL Oral QID Love, Pamela S, PA-C   5 mL at 12/09/21 2042   magnesium hydroxide (MILK OF MAGNESIA) suspension 30 mL  30 mL Oral Daily PRN Bary Leriche, PA-C       MEDLINE mouth rinse  15 mL Mouth Rinse BID Charlett Blake, MD   15 mL at 12/09/21 2042   metoprolol tartrate (LOPRESSOR) tablet 12.5 mg  12.5 mg Oral BID Bary Leriche, PA-C   12.5  mg at 12/09/21 2040   mirtazapine (REMERON) tablet 7.5 mg  7.5 mg Oral QHS Charlett Blake, MD   7.5 mg at 12/09/21 2040   nicotine (NICODERM CQ - dosed in mg/24 hours) patch 14 mg  14 mg Transdermal Daily [name], MD   14 mg at 12/09/21 1115   pantoprazole (PROTONIX) EC tablet 40 mg  40 mg Oral Daily Bary Leriche, PA-C   40 mg at 12/09/21 1112   polyethylene glycol (MIRALAX / GLYCOLAX) packet 17 g  17 g Oral Daily Bary Leriche, PA-C   17 g at 12/09/21 1112   polyvinyl alcohol (LIQUIFILM TEARS) 1.4 % ophthalmic solution 1 drop  1 drop Both Eyes QID PRN Love, Pamela S, PA-C       prochlorperazine (COMPAZINE) tablet 5-10 mg  5-10 mg Oral Q6H PRN Love, Pamela S, PA-C       Or   prochlorperazine (COMPAZINE) injection 5-10 mg  5-10 mg Intramuscular Q6H PRN Love, Pamela S, PA-C       Or   prochlorperazine (COMPAZINE) suppository 12.5 mg  12.5 mg Rectal Q6H PRN Love, Pamela S, PA-C       silver sulfADIAZINE (SILVADENE) 1 % cream   Topical BID Charlett Blake, MD   Given at 12/09/21 2041   sodium phosphate (FLEET) 7-19 GM/118ML enema 1 enema  1 enema Rectal Once PRN Love, Pamela S, PA-C       tamsulosin [facility]) capsule 0.4 mg  0.4 mg Oral QPC supper Love, Pamela S, PA-C   0.4 mg at 12/09/21 1748   traZODone (DESYREL) tablet 25-50 mg  25-50 mg Oral QHS PRN Bary Leriche, PA-C   50 mg at 12/09/21 2357   vitamin B-12 (CYANOCOBALAMIN) tablet 1,000 mcg  1,000 mcg Oral Daily Bary Leriche, PA-C   1,000 mcg at 12/09/21 1112    Pt meds include: Statin :yes Betablocker: Yes ASA: No Other anticoagulants/antiplatelets: Plavix, Lovenox  Past Medical History:  Diagnosis Date   Abnormal TSH 09/28/2017   Anemia 11/27/2021   Anxiety 07/03/2012   Depression    Difficulty sleeping 09/28/2017   Examination of participant in clinical trial 09/11/2012   History of immunotherapy 02/23/2016   Melanoma of skin 10/26/2011   Nicotine dependence 07/17/2015   Smoker    STEMI (ST elevation  myocardial infarction) ([place]) 11/27/2021    Past Surgical History:  Procedure Laterality Date   BRONCHIAL BIOPSY  10/09/2021   Procedure: BRONCHIAL BIOPSIES;  Surgeon: [name], MD;  Location: [facility];  Service: Endoscopy;;   BRONCHIAL BRUSHINGS  10/09/2021   Procedure: BRONCHIAL BRUSHINGS;  Surgeon: [name], MD;  Location: [facility];  Service: Endoscopy;;   ENDOBRONCHIAL ULTRASOUND N/A 10/09/2021   Procedure: ENDOBRONCHIAL ULTRASOUND;  Surgeon: [name], MD;  Location: [facility];  Service: Endoscopy;  Laterality: N/A;   FINE NEEDLE ASPIRATION  10/09/2021   Procedure: FINE NEEDLE ASPIRATION (FNA) LINEAR;  Surgeon: [name], MD;  Location: [facility];  Service: Endoscopy;;   LEFT POPLITEAL MASS RESECTION  08/27/2011   pigmented villonodular synovitis, negative for malignancy   LYMPH GLAND EXCISION  07/12/2011   right axilla, consistent with melanoma   LYMPH NODE DISSECTION  08/27/2011   right, 0/17 nodes positive   VIDEO BRONCHOSCOPY N/A 10/09/2021   Procedure: VIDEO BRONCHOSCOPY WITHOUT FLUORO;  Surgeon: [name], MD;  Location: [facility];  Service: Endoscopy;  Laterality: N/A;    Social History Social History   Tobacco Use   Smoking status: Former    Packs/day: 0.25    Types: Cigarettes   Smokeless tobacco: Never  Vaping Use   Vaping Use: Never used  Substance Use Topics   Alcohol use: Yes    Alcohol/week: 6.0 standard drinks    Types: 6 Shots of liquor per week   Drug use: No    Family History Family History  Problem Relation Age of Onset   Hypertension Mother    CAD Mother    Lung cancer Father     Allergies  Allergen Reactions   Iopamidol Hives and Itching    Isovue break through with prep, Hives and Itching  Patient had PET?CT scan with 125 mls of Isovue-300. Noted to have two raised itchy hives which self-resolved. Patient had PET/CT scan with contrast on 05/27/2014 while on prednisone prep (3  doses of 50 mg-13 hour prep) and noted to have two hives which self -resolved without treatment   Iodinated Diagnostic Agents Rash   Iodine-131 Rash     REVIEW OF SYSTEMS  General: [ ]  Weight loss, [ ]  Fever, [ ]  chills Neurologic: [ ]  Dizziness, [ ]  Blackouts, [ ]  Seizure [ ]  Stroke, [ ]  "Mini stroke", [ ]  Slurred speech, [ ]  Temporary blindness; [ ]  weakness in arms or legs, [ ]  Hoarseness [ ]  Dysphagia Cardiac: [ ]  Chest pain/pressure, [ ]  Shortness of breath at rest [ ]  Shortness of breath with exertion, [ ]  Atrial fibrillation or irregular heartbeat  Vascular: [ ]  Pain in legs with walking, [ ]  Pain in legs at rest, [ ]  Pain in legs at night,  [ ]  Non-healing ulcer, [ ]  Blood clot in vein/DVT,   Pulmonary: [ ]  Home oxygen, [ ]  Productive cough, [ ]  Coughing up blood, [ ]  Asthma,  [ ]  Wheezing [ ]  COPD Musculoskeletal:  [ ]  Arthritis, [ ]  Low back pain, [ ]  Joint pain Hematologic: [ ]  Easy Bruising, [ ]  Anemia; [ ]  Hepatitis Gastrointestinal: [ ]  Blood in stool, [ ]  Gastroesophageal Reflux/heartburn, Urinary: [ ]  chronic Kidney disease, [ ]  on HD - [ ]  MWF or [ ]  TTHS, [ ]  Burning with urination, [ ]  Difficulty urinating Skin: [ ]  Rashes, [ ]  Wounds Psychological: [ ]  Anxiety, [ ]  Depression  Physical Examination Vitals:   12/09/21 2356 12/10/21 0356 12/10/21 0356 12/10/21 0406  BP:  135/76  Pulse: (!) 105 (!) 114 (!) 115   Resp:  20    Temp:   97.8 F (36.6 C)   TempSrc:   Oral   SpO2: 95% 97% 100%   Weight:    85.1 kg   Body mass index is 27.71 kg/m.  General:  WDWN in NAD, very tired and agitated.  HENT: WNL Eyes: Pupils equal Pulmonary: normal non-labored breathing , without Rales, rhonchi,  wheezing Cardiac: tachycardia RR, without  Murmurs, rubs or gallops; No carotid bruits Abdomen: soft, NT, no masses Skin: no rashes, ulcers noted;  positive open wounds              Vascular Exam/Pulses:left UE with radial, ulna and palmer doppler signals.   Right LE femoral pulse palpable, weak right femoral palpable.  Doppler DP/PT right LE, high AT doppler on left only.   Left LE from mid anterior tibial area is very cool to touch.  Motor is intact and sensation per patient is intact B LE.   Musculoskeletal: no muscle wasting or atrophy; positive B LE ptting edema  Neurologic: A&O X 3; Appropriate Affect ;  SENSATION: normal; MOTOR FUNCTION: intact grossly  Speech is fluent/normal   Significant Diagnostic Studies: CBC Lab Results  Component Value Date   WBC 28.9 (H) 12/10/2021   HGB 11.8 (L) 12/10/2021   HCT 35.8 (L) 12/10/2021   MCV 94.2 12/10/2021   PLT 61 (L) 12/10/2021    BMET    Component Value Date/Time   NA 126 (L) 12/07/2021 0654   K 3.6 12/07/2021 0654   CL 88 (L) 12/07/2021 0654   CO2 29 12/07/2021 0654   GLUCOSE 100 (H) 12/07/2021 0654   BUN 24 (H) 12/07/2021 0654   CREATININE 0.88 12/07/2021 0654   CREATININE 0.82 11/27/2021 1142   CALCIUM 7.9 (L) 12/07/2021 0654   GFRNONAA >60 12/07/2021 0654   GFRNONAA >60 11/27/2021 1142   Estimated Creatinine Clearance: 97.8 mL/min (by C-G formula based on SCr of 0.88 mg/dL).  COAG Lab Results  Component Value Date   INR 1.5 (H) 11/28/2021   INR 1.6 (H) 11/27/2021     Non-Invasive Vascular Imaging:      +---------+---------------+---------+-----------+----------+--------------+    RIGHT     Compressibility Phasicity Spontaneity Properties Thrombus  Aging   +---------+---------------+---------+-----------+----------+--------------+    CFV       Full            Yes       Yes                                       +---------+---------------+---------+-----------+----------+--------------+    SFJ       Full                                                                +---------+---------------+---------+-----------+----------+--------------+    FV Prox   Full                                                                 +---------+---------------+---------+-----------+----------+--------------+  FV Mid    Full                                                                +---------+---------------+---------+-----------+----------+--------------+    FV Distal Full                                                                +---------+---------------+---------+-----------+----------+--------------+    PFV       Full                                                                +---------+---------------+---------+-----------+----------+--------------+    POP       Full            Yes       Yes                                       +---------+---------------+---------+-----------+----------+--------------+    PTV       Full                                                                +---------+---------------+---------+-----------+----------+--------------+    PERO      Full                                                                +---------+---------------+---------+-----------+----------+--------------+    Gastroc   None                      No                     Acute             +---------+---------------+---------+-----------+----------+--------------+            +---------+---------------+---------+-----------+----------+--------------+    LEFT      Compressibility Phasicity Spontaneity Properties Thrombus  Aging   +---------+---------------+---------+-----------+----------+--------------+    CFV       Full            Yes       Yes                                       +---------+---------------+---------+-----------+----------+--------------+    SFJ  Full                                                                +---------+---------------+---------+-----------+----------+--------------+    FV Prox   Full                                                                +---------+---------------+---------+-----------+----------+--------------+    FV  Mid    Full                                                                +---------+---------------+---------+-----------+----------+--------------+    FV Distal Full                                                                +---------+---------------+---------+-----------+----------+--------------+    PFV       Full                                                                +---------+---------------+---------+-----------+----------+--------------+    POP       Full            Yes       Yes                                       +---------+---------------+---------+-----------+----------+--------------+    PTV       Full                                                                +---------+---------------+---------+-----------+----------+--------------+    PERO      Full                                                                +---------+---------------+---------+-----------+----------+--------------+    Gastroc   None  No                     Acute             +---------+---------------+---------+-----------+----------+--------------+   Summary:  RIGHT:  - Findings consistent with acute deep vein thrombosis involving the right  gastrocnemius veins.  - No cystic structure found in the popliteal fossa.     LEFT:  - Findings consistent with acute deep vein thrombosis involving the left  gastrocnemius veins.  - No cystic structure found in the popliteal fossa.      ASSESSMENT/PLAN:  diagnosis of NSCLC with mets to the brain10/21/22 bi-cerebral embolic CVA's likely due to hypercoagulable state Recent COVID no longer on precautions  B LE gastrocnemius MVT.  Left LE with high AT doppler signal.  Motor intact right > left LE. Mixed venous disease and likely left LE small vessel disease verses thrombus.  He may need aortogram with possible intervention Left LE.  He may benefit from UNA boot wrap on the right LE to assist with edema and  tissue loss from venous edema.     Dr. Donzetta Matters will discuss treatment plan.     Roxy Horseman 12/10/2021 10:00 AM   I have reviewed the chart and agree with PA assessment and plan above. I have also discussed with his wife. Recent history of bilateral mvt now with wounds that appear to be mixed venous and arterial.  We will plan for angiogram tomorrow in cath lab.   Huck Ashworth C. Donzetta Matters, MD [facility] Office: [phone] Pager: [phone]  Addendum: I was able to see the patient after radiation therapy talk with he and his family.  He has mostly somnolent during our discussion but does want to have everything done at this time.  We will begin with angiography from a right common femoral approach tomorrow to with a focus to treat the left lower extremity.  I discussed with the family the possible outcomes which include stenting or balloon angioplasty versus no need for intervention versus disease too severe for intervention which would render him high risk for amputation.  They demonstrate good understanding and asked very poignant questions.  Servando Snare, MD

## 2021-12-10 NOTE — Progress Notes (Signed)
Physical Therapy Session Note  Patient Details  Name: Christopher Randall MRN: [id number] Date of Birth: 03/29/1961  Today's Date: 12/10/2021 PT Individual Time: 1405-1500 PT Individual Time Calculation (min): 55 min   Short Term Goals: Week 1:  PT Short Term Goal 1 (Week 1): STGs + LTG due to ELOS.   Skilled Therapeutic Interventions/Progress Updates:   Pt received sitting in recliner and agreeable to PT. Pt noted to hav O2 off. PT assessed VS, HR 63, SpO2 75%. Pt instructed pt to don O2 on 4L/min after  min O2 able to be reassessed at 95%. RN notidifed. Pt performed gait training in hall x 152f with CGA-min assist x 1056fwith mild pain in the LLE following completion of gait training on 4L/min O2. SpO2 desat to 86% with 30 sec rest break increased to 95%. Pt reports need for BM. [facility]. Pt able to complete clothing management and pericare with supervision assist. Ambulatory transfer to EOB with supervision assist and RW. Seated therex. LAQ, reciprocal march. Ankle pumps, hip abduction. Cues for pursed lip breathing throughout as well as prolonged therapeutic rest breaks due to fatigue and increasing lethargy throughout session. Pt then performed stand pivot transfer to recliner to rest prior to transport for imaging per RN. Left with call bell in reach and all needs met.       Therapy Documentation Precautions:  Precautions Precautions: Fall Precaution Comments: L elbow septic bursitis, B heel wounds, monitor SpO2, decreased cognition Restrictions Weight Bearing Restrictions: No  Vital Signs: Therapy Vitals Temp: 97.8 F (36.6 C) Temp Source: Oral Pulse Rate: (!) 109 Resp: 20 BP: (!) 118/96 Patient Position (if appropriate): Sitting Oxygen Therapy SpO2: 99 % O2 Device: Nasal Cannula O2 Flow Rate (L/min): 5 L/min Pain: Pain Assessment Pain Scale: Faces Pain Score: 0-No pain     Therapy/Group: Individual  Therapy  AuLorie Phenix2/22/2022, 3:03 PM

## 2021-12-10 NOTE — Progress Notes (Signed)
Speech Language Pathology Daily Session Note  Patient Details  Name: Christopher Randall MRN: [id number] Date of Birth: Dec 11, 1961  Today's Date: 12/10/2021 SLP Individual Time: 9518-8416 SLP Individual Time Calculation (min): 45 min  Short Term Goals: Week 1: SLP Short Term Goal 1 (Week 1): STG = LTG d/t ELOS  Skilled Therapeutic Interventions:   Patient seen for skilled SLP intervention focused on cognitive function goals with wife present in room. Patient sitting up in [facility] when SLP arrived and was awake and alert, agreeable to therapy. SLP introduced initial task of filling out medical form with personal information (name, address, PCP's name, etc). Patient able to fill out information correctly as per wife's verification but he was impulsive and handwriting was largely illegible. He would intermittently require cues to arouse as he would close eyes and stop communicating without warning. SLP then introduced a basic level alternating attention task with patient becoming distracted by too much information on page. When SLP folded paper in half to block out other information, patient then able to perform task with 100% accuracy and minA verbal cues. He would frequently become distracted by waist belt alarm which was in place but very loose. When his wife would cue him to stop touching it, he would at least briefly. Patient and wife educated regarding recommendation to look over medication list that SLP had summarized for him with goal of him looking up purpose of each medication. Patient left in [facility] with all needs in place, safety alarms activated and wife in room. He continues to benefit from skilled SLP intervention to maximize cognitive function goals prior to discharge.  Pain Pain Assessment Pain Scale: Faces Pain Score: 0-No pain  Therapy/Group: Individual Therapy  Sonia Baller, MA, CCC-SLP Speech Therapy

## 2021-12-10 NOTE — Discharge Summary (Signed)
Physician Discharge Summary  Patient ID: Christopher Randall MRN: [id number] DOB/AGE: [birth year] 59 y.o.  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 COMPARISON:  MR head 11/27/2021, MRA head 11/28/2021 FINDINGS: The study was terminated before axial T1 images could be obtained. Brain: There are numerous foci of diffusion restriction with associated T2/FLAIR signal abnormality in the bilateral cerebellar hemispheres (left significantly worse than right), left dorsal midbrain, and bilateral parietal lobes consistent with acute infarct. Faint foci of diffusion restriction in the bilateral frontal lobes may reflect small subacute infarcts. There is no associated hemorrhage. Again seen is an extra-axial lesion along the right aspect of the falx near the vertex measuring up to approximately 0.9 cm AP on the FLAIR sequence, not significantly changed in size when measured on the FLAIR sequence on the prior study from 11/27/2021 (5-27). There is mild edema in the underlying parasagittal frontal lobe, unchanged. The previously seen enhancing lesion in the left parasagittal frontal lobe is not seen on the current noncontrast study. Parenchymal volume is  stable. The ventricles are unchanged in size. Scattered additional foci of FLAIR signal abnormality in the subcortical and periventricular white matter likely reflects sequela of mild chronic white matter microangiopathy. There is no midline shift. Vascular: Normal flow voids. Skull and upper cervical spine: Normal marrow signal. Sinuses/Orbits: The paranasal sinuses are clear. The globes and orbits are unremarkable. Other: None. IMPRESSION: 1. Numerous foci of acute infarct in the bilateral cerebellar hemispheres (left worse than right), midbrain, and bilateral parietal lobes, again favored to be embolic in etiology. 2. Probable small subacute infarcts in the bilateral frontal lobes. 3. No significant interval change in size of the extra-axial lesion along the right aspect of the falx near the vertex when measured on the FLAIR sequence. The smaller lesion in the left parasagittal frontal lobe is not identified on this noncontrast study. Electronically Signed   By: Valetta Mole M.D.   On: 12/09/2021 13:08    VAS [place] ABI WITH/WO TBI  Result Date: 12/10/2021  LOWER EXTREMITY DOPPLER STUDY Patient Name:  Christopher Randall  Date of Exam:   12/10/2021 Medical Rec #: [id number]          Accession #:    [id number] Date of Birth: [birth year]          Patient Gender: M Patient Age:   60 years Exam Location:  [facility] Procedure:      VAS [place] ABI WITH/WO TBI Referring Phys: Servando Snare --------------------------------------------------------------------------------  Indications: Peripheral artery disease. High Risk Factors: Past history of smoking, prior MI. Other Factors: Emolic CVA, Cancer patient on chemotherapy.  Comparison Study: No previous exams Performing Technologist: Hill, Jody RVT, RDMS  Examination Guidelines: A complete evaluation includes at minimum, Doppler waveform signals and systolic blood pressure reading at the level of bilateral brachial, anterior tibial,  and posterior tibial arteries, when  vessel segments are accessible. Bilateral testing is considered an integral part of a complete examination. Photoelectric Plethysmograph (PPG) waveforms and toe systolic pressure readings are included as required and additional duplex testing as needed. Limited examinations for reoccurring indications may be performed as noted.  ABI Findings: +--------+------------------+-----+---------+--------+  Right    Rt Pressure (mmHg) Index Waveform  Comment   +--------+------------------+-----+---------+--------+  Brachial 126                      triphasic           +--------+------------------+-----+---------+--------+  PTA      164                1.30  triphasic           +--------+------------------+-----+---------+--------+  DP       135                1.07  biphasic            +--------+------------------+-----+---------+--------+ +--------+------------------+-----+----------+---------------------------+  Left     Lt Pressure (mmHg) Index Waveform   Comment                      +--------+------------------+-----+----------+---------------------------+  Brachial                          triphasic  IVx2                         +--------+------------------+-----+----------+---------------------------+  PTA                                          Not visualized - open wound  +--------+------------------+-----+----------+---------------------------+  DP       53                 0.42  monophasic                              +--------+------------------+-----+----------+---------------------------+ +-------+-----------+-----------+------------+------------+  ABI/TBI Today's ABI Today's TBI Previous ABI Previous TBI  +-------+-----------+-----------+------------+------------+  Right   1.30        0                                      +-------+-----------+-----------+------------+------------+  Left    0.42        0                                      +-------+-----------+-----------+------------+------------+  Unable to  exam LLE PT due to open wounds.    Preliminary    Labs:  Basic Metabolic Panel: Recent Labs  Lab 12/05/21 0506 12/07/21 0654 12/11/21 0536  NA 126* 126* 130*  K 3.8 3.6 4.1  CL 88* 88* 89*  CO2 27 29 26   GLUCOSE 113* 100* 104*  BUN 20 24* 55*  CREATININE 0.91 0.88 1.53*  CALCIUM 8.0* 7.9* 7.9*    CBC: Recent Labs  Lab 12/05/21 0506 12/06/21 0515 12/09/21 0518 12/10/21 0551 12/11/21 0536  WBC 36.2*   < > 25.7* 28.9* 24.6*  NEUTROABS  28.9*  --   --   --  20.1*  HGB 10.5*   < > 11.2* 11.8* 10.9*  HCT 30.2*   < > 32.4* 35.8* 32.2*  MCV 90.7   < > 91.5 94.2 93.3  PLT 46*   < > 65* 61* 73*   < > = values in this interval not displayed.    CBG: No results for input(s): GLUCAP in the last 168 hours.  Brief HPI:   Christopher Randall is a 60 y.o. male with history of metastatic melanoma in the past, anxiety and depression, diagnosis of NSCLC with mets to brain 10/09/2021 with reports of hallucination and headache since Thanksgiving and recent initiation of chemo with XRT.  He was unable to proceed with full dose chemo due to thrombocytopenia and developed lesions BLE.  He was admitted on 11/27/2021 from heme-onc office with edema BLE with cellulitis and drainage as well as 3 to 4 days of increasing confusion since XRT.  MRI brain done revealing numerous small foci of acute to subacute infarcts involving multiple vascular territories suggestive of central embolic source.  MRA brain showed moderate stenosis of right ACA and loss of signal related to distal right VA.  BLE Doppler showed acute DVT in right and left gastrocnemius vein.  He was noted to have significant hyponatremia at admission with Na-119, platelets 29 as well as bilateral airspace disease with small right effusion concerning for multifocal pneumonia.  He was started on IV antibiotics for PNA and cellulitis.  He also reported chest pain and was noted to have elevated cardiac enzyme and  2D echo showed EF 55 to 60% with  hypokinesis of apex.  He was transfused with platelets, started on low-dose aspirin and IV heparin.  Dr. Davina Poke was consulted for input due to STEMI and felt that patient had stress-induced cardiomyopathy, was not a candidate for in invasive procedure and recommended medical management with Plavix and beta-blocker.  Dr. Marin Olp was consulted for input and felt that low sodium could be from dehydration as well as recent treatment.  Dr. Erlinda Hong felt the stroke was embolic due to hypercoagulable state from advanced malignancy.  Patient also reported left elbow pain with swelling on 12/12 which was concerning for septic bursitis.  Elbow was aspirated and culture was positive for abundant staph aureus.  ID was consulted for input and CT chest ordered 12/14 showing worsening of extensive mediastinal and hilar adenopathy with enlargement of supraclavicular and abdominal retroperitoneal nodes, enlarging RUL mass collapsing RML and effacing proximal bronchi, cardiomegaly with increased pericardial effusion as well as interstitial and groundglass opacities concerning for pneumonia and/or lymphangitis carcinomatosis.  Antibiotics were narrowed to [facility] with recommendation of 2 to 3 weeks of regimen starting 12/15 and to follow-up with ID in 2 weeks.  Patient was having improvement in pain of left elbow after aspiration and no formal I&D was needed per Dr. Marin Olp.  He was transitioned to Eliquis for secondary stroke prevention.  Therapies were ongoing and patient was noted to have limitations in mobility and ADLs.  CIR was recommended due to functional decline.   Hospital Course: Christopher Randall was admitted to rehab 12/04/2021 for inpatient therapies to consist of PT, ST and OT at least three hours five days a week. Past admission physiatrist, therapy team and rehab RN have worked together to provide customized collaborative inpatient rehab.During patient's stay in rehab weekly team conferences were held to monitor  patient's progress, set goals and discuss barriers to discharge. At  admission, patient required CGA with ADL tasks and min assist with mobility.  He exhibited mild to moderate cognitive deficits with SLUMS score 15/30, impulsivity and poor awareness of deficits.  Patient has continued to have issues with poor p.o. intake despite May use on board.  Multiple nutritional supplements were offered during the day.  He has also had issues with sleep-wake disruption as well as worsening of confusion.  Remeron and Klonopin doses with decrease with improvement in alertness.  Serial check of electrolytes shows that sodium continues to fluctuate.  Serial CBC white count to be slowly improving.  He reported ongoing issues with odynophagia due to thrush therefore MMW was added to diflucan to help ameliorate symptoms.  He reported difficulty with voiding with significant hesitancy therefore UA/UC was repeated and bladder scans ordered to monitor PVRs.  He was noted to have problems with urinary retention and has required cath.  Urine cultures x2 have been negative. He has been transported to [facility] daily for ongoing XRT.  Dr. Lanell Persons who is familiar with patient expressed concerns about patient's decline therefore MRI of brain was ordered for work-up.  Dr. Leonie Man was also consulted for input and patient was noted to be more alert and oriented but appeared to be more fatigued.  MRI brain did show new left cerebellar and midbrain infarcts that did not appear to be symptomatic but signal failure of apixaban.  Case was discussed with Dr. Mickeal Skinner and Dr. Leonie Man by Dr. Letta Pate and patient was transitioned to treatment dose Lovenox in setting of apixaban failure and hypercoagulable state associated with NSCLC.  He has an appointment currently to follow-up with Dr. Tereso Newcomer on 12/29 for office visit and to discuss [place].   His cognition continues to fluctuate.  He has also had issues with intermittent hypoxia requiring supplemental  oxygen. Lesions on BLE have continued to evolve and have declined with tissue loss, ongoing drainage and significant areas of yellow slough.  WOC was consulted for input and recommended consulting vascular surgery for input on further plan of care.  WOC recommended addition of Silvadene to BLE in addition to dry dressings twice daily.  Dr. 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  Angiography via right common femoral approach was set for work-up and to decide if patient would need stenting versus angioplasty.  He was discharged OR on 12/23 for transition to acute floor for monitoring after vascular procedure.  [facility] hospitalist also was contacted regarding transitioning patient to medical care as patient has had progressive decline and inability to tolerate intensive rehab program. Dr. Tamala Julian graciously agreed to assume care and patient was discharged to acute hospital on 12/11/21.    Current Medications:  Prosource supplements twice daily between meals Lipitor 40 mg p.o. per day Lovenox 85 mg subcu daily Duricef 1000 mg p.o. twice daily Klonopin 0.25 mg twice daily as needed Plavix 75 mg p.o. per day Decadron 1 mg p.o. per day 8.  Diflucan 100 mg p.o. daily 9.  Folic acid 1 mg p.o. per day 10.  Lasix 40 mg p.o. per day 11.  Mucinex 1200 mg p.o. twice daily 12.  Hydrocodone 5-3 25 1-2 p.o. every 4 hours as needed severe pain 13.  DuoNebs every 6 hours as needed shortness of breath 14.  Magic mouthwash with lidocaine 5 cc p.o. 4 times daily swish and swallow 15.  Metoprolol 12.5 mg p.o. twice daily 16.  Nicotine patch 15 mg change daily 17.  Remeron  7.5 mg p.o. nightly 18.  Silvadene cream to lesions on BLE after cleansing with soap and water.  19.  Trazodone 25 to 50 mg p.o. nightly as needed 20.  Flomax 0.4 mg p.o. q. p.m. 21.  Vitamin B12 1000 mcg p.o. per day   Diet: Regular.   Special Instructions:  Recommend cortak as intake  continues to be poor.   2. Will need transportation daily to continue XRT at [facility]  3.  In and out cath every 6 hours as needed volumes greater than 350 cc 4.  Wound care to BLE twice daily.  Cleanse with soap and water, apply Silvadene, cover with nonadherent dressing, cover with ABD pads and secure with Kerlix.  Disposition: Acute Hospital     Signed: Bary Leriche 12/11/2021, 4:21 PM

## 2021-12-10 NOTE — Progress Notes (Signed)
Occupational Therapy Session Note  Patient Details  Name: Christopher Randall MRN: [id number] Date of Birth: 1961/09/11  Today's Date: 12/10/2021 OT Individual Time: 3582-5189 OT Individual Time Calculation (min): 49 min    Short Term Goals: Week 1:  OT Short Term Goal 1 (Week 1): STG = LTG 2/2 ELOS  Skilled Therapeutic Interventions/Progress Updates:    Pt received seated in w/c with wife present, no initial c/o pain but c/o SOB throughout session, agreeable to therapy with encouragement. Session focus on self-care retraining, activity tolerance, transfer retraining, PLB in prep for improved ADL/IADL/func mobility performance + decreased caregiver burden. Pt noted to require frequent cuing/reminders of therapeutic task at hand, often fidgeting with [place] and gait belt becoming distracted. R sock noted to be soaked with blood, LPN notified and bandage on 3rd digit replaced. Amb > sink with CGA and RW. Required to remain seated at sink due to DOE and significant fatigue. Guided through PLB throughout session as pt noted to inhale/exhale quickly.   Pt able to shave head with electric trimmers with overall mod A for thoroughness due to fatigue and requiring extensive time to complete.   Short ambulatory transfer > recliner with CGA and assist to guide RW into turn.   Attempting to assess SatO2 throughout session, pt on 4L via [place], but difficulty assessing saturation due to poor perfusion through digits. Momentarily read at 73 - 83%. LPN made aware and will reassess.  Pt left seated in recliner with vascular team/wife present with safety belt placed, call bell in reach, and all immediate needs met.    Therapy Documentation Precautions:  Precautions Precautions: Fall Precaution Comments: L elbow septic bursitis, B heel wounds, monitor SpO2, decreased cognition Restrictions Weight Bearing Restrictions: No  Pain: R foot pain with ambulation, did not rate and improves with seated rest ADL: See  Care Tool for more details.   Therapy/Group: Individual Therapy  Volanda Napoleon MS, OTR/L  12/10/2021, 6:58 AM

## 2021-12-10 NOTE — Consult Note (Addendum)
[facility] Nurse re-consult Note: Reason for Consult: Refer to previous [place] consult from 12/11.  Wounds have significantly declined since that time; refer to photos in the EMR. Appearance is atypical and there are significant areas of yellow slough; recommend consult to vascular surgery for further plan of care.  Pt has a history of previous DVTs and Covid. Silvadene has not improved wounds. Discussed with Dr Clair Gulling. Wound type:  Left lower calf and foot is dark purple with patchy areas of yellow moist "punched out: round full thickness wounds scattered across leg and foot.  Slough has evolved to posterior calf; 15X.6 cm and inner leg, 12.5X5cm.  Mod amt tan drainage, painful to touch Right leg with same appearance; outer leg with yellow slough, 6X4cm and inner leg with yellow slough 8X10cm. Dressing procedure/placement/frequency: Pt's wife at the bedside to assess wound appearance and discuss plan of care.  Topical treatment orders provided for bedside nurses to perform as follows until further recommendations are available from the vascular team: Apply Silvadene to bilateral lower legs and feet wounds AFTER washing with soap and water. Then cover with nonadherent dressings and ABD pads, secure with kerlix.  Please re-consult if further assistance is needed.  Thank-you,  Julien Girt MSN, [place], [place], [place], [place]

## 2021-12-10 NOTE — Progress Notes (Signed)
PROGRESS NOTE   Subjective/Complaints:  Per wife, who's at bedside; more confused this AM- put O2 back on him Not real cooperative- is scared per wife.   LBM yesterday.  Not urinating a lot- but also not drinking a lot.  Spoke to [facility] they are concerned that legs aren't looking any better.  Question about d/c- wife said "cannot take home like he is"- confused and not cooperative.     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 COMPARISON:  MR head 11/27/2021, MRA head 11/28/2021 FINDINGS: The study was terminated before axial T1 images could be obtained. Brain: There are numerous foci of diffusion restriction with associated T2/FLAIR signal abnormality in the bilateral cerebellar hemispheres (left significantly worse than right), left dorsal midbrain, and bilateral parietal lobes consistent with acute infarct. Faint foci of diffusion restriction in the bilateral frontal lobes may reflect small subacute infarcts. There is no associated hemorrhage. Again seen is an extra-axial lesion along the right aspect of the falx near the vertex measuring up to approximately 0.9 cm AP on the FLAIR sequence, not significantly changed in size when measured on the FLAIR sequence on the prior study from 11/27/2021 (5-27). There is mild edema in the underlying parasagittal frontal lobe, unchanged. The previously seen enhancing lesion in the left parasagittal frontal lobe is not seen on the current noncontrast study. Parenchymal volume is stable. The ventricles are unchanged in size. Scattered  additional foci of FLAIR signal abnormality in the subcortical and periventricular white matter likely reflects sequela of mild chronic white matter microangiopathy. There is no midline shift. Vascular: Normal flow voids. Skull and upper cervical spine: Normal marrow signal. Sinuses/Orbits: The paranasal sinuses are clear. The globes and orbits are unremarkable. Other: None. IMPRESSION: 1. Numerous foci of acute infarct in the bilateral cerebellar hemispheres (left worse than right), midbrain, and bilateral parietal lobes, again favored to be embolic in etiology. 2. Probable small subacute infarcts in the bilateral frontal lobes. 3. No significant interval change in size of the extra-axial lesion along the right aspect of the falx near the vertex when measured on the FLAIR sequence. The smaller lesion in the left parasagittal frontal lobe is not identified on this noncontrast study. Electronically Signed   By: Valetta Mole M.D.   On: 12/09/2021 13:08   Recent Labs    12/09/21 0518 12/10/21 0551  WBC 25.7* 28.9*  HGB 11.2* 11.8*  HCT 32.4* 35.8*  PLT 65* 61*   No results for input(s): NA, K, CL, CO2, GLUCOSE, BUN, CREATININE, CALCIUM in the last 72 hours.  Intake/Output Summary (Last 24 hours) at 12/10/2021 1238 Last data filed at 12/09/2021 2356 Gross per 24 hour  Intake 440 ml  Output --  Net 440 ml     Pressure Injury 12/04/21 Arm Lower;Posterior;Proximal;Right Stage 1 -  Intact skin with non-blanchable redness of a localized area usually over a bony  prominence. reddness to the right elbow (Active)  12/04/21 1710  Location: Arm  Location Orientation: Lower;Posterior;Proximal;Right  Staging: Stage 1 -  Intact skin with non-blanchable redness of a localized area usually over a bony prominence.  Wound Description (Comments): reddness to the right elbow  Present on Admission: Yes    Physical Exam: Vital Signs Blood pressure 135/76, pulse (!) 115, temperature 97.8 F (36.6 C),  temperature source Oral, resp. rate 20, weight 85.1 kg, SpO2 100 %.   General: awake, alert, but confused; wife at bedside; sitting EOB; wife put O2 back in place;  NAD HENT: conjugate gaze; oropharynx moist CV: tachycardic- ; no JVD Pulmonary: decreased throughout- no W/R/R heard- O2 by [place] in place GI: soft, NT, ND, (+)BS Psychiatric: appropriate Neurological: pt Ox1-2 this AM- a little confused; not answering questions real well- little spontaneous speech Skin: ACE wrap bilateral legs, foot/ankle wounds, left elbw looks much better with decreased swelling as well. B/L leg wounds assessed by vascular pictures- areas of what appears to be necrosis- per vascular notes today Neurologic:Alert , oriented person, hospital, year  , month but not day or date Cranial nerves II through XII intact, motor strength is 5/5 in bilateral deltoid, bicep, tricep, grip, hip flexor, knee extensors, ankle dorsiflexor and plantar flexor Sensory exam normal sensation to light touch  in bilateral upper and lower extremities  Musculoskeletal: Full range of motion in all 4 extremities. No joint swelling    Assessment/Plan: 1. Functional deficits which require 3+ hours per day of interdisciplinary therapy in a comprehensive inpatient rehab setting. Physiatrist is providing close team supervision and 24 hour management of active medical problems listed below. Physiatrist and rehab team continue to assess barriers to discharge/monitor patient progress toward functional and medical goals  Care Tool:  Bathing    Body parts bathed by patient: Face, Right arm, Left arm, Chest, Abdomen, Front perineal area, Buttocks, Right upper leg, Left upper leg, Right lower leg, Left lower leg         Bathing assist Assist Level: Contact Guard/Touching assist (stance)     Upper Body Dressing/Undressing Upper body dressing   What is the patient wearing?: Pull over shirt    Upper body assist Assist Level: Supervision/Verbal  cueing    Lower Body Dressing/Undressing Lower body dressing      What is the patient wearing?: Pants     Lower body assist Assist for lower body dressing: Moderate Assistance - Patient 50 - 74%     Toileting Toileting    Toileting assist Assist for toileting: Minimal Assistance - Patient > 75%     Transfers Chair/bed transfer  Transfers assist     Chair/bed transfer assist level: Minimal Assistance - Patient > 75%     Locomotion Ambulation   Ambulation assist      Assist level: Contact Guard/Touching assist Assistive device: Walker-rolling Max distance: 50   Walk 10 feet activity   Assist     Assist level: Contact Guard/Touching assist Assistive device: Walker-rolling   Walk 50 feet activity   Assist    Assist level: Contact Guard/Touching assist Assistive device: Walker-rolling    Walk 150 feet activity   Assist Walk 150 feet activity did not occur: Safety/medical concerns         Walk 10 feet on uneven surface  activity   Assist Walk 10 feet on uneven surfaces activity did not occur: Safety/medical concerns         Wheelchair     Assist Is  the patient using a wheelchair?: Yes Type of Wheelchair: Manual    Wheelchair assist level: Contact Guard/Touching assist Max wheelchair distance: 40    Wheelchair 50 feet with 2 turns activity    Assist    Wheelchair 50 feet with 2 turns activity did not occur:  (per PT documentation)   Assist Level: Minimal Assistance - Patient > 75%   Wheelchair 150 feet activity     Assist  Wheelchair 150 feet activity did not occur:  (per PT documentation)   Assist Level: Maximal Assistance - Patient 25 - 49%   Blood pressure 135/76, pulse (!) 115, temperature 97.8 F (36.6 C), temperature source Oral, resp. rate 20, weight 85.1 kg, SpO2 100 %.  Medical Problem List and Plan: 1. 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 Order palliative consult   12/22- Continue CIR- PT, OT and SLP - wife wants to keep him here until 12/28- however not sure if will improve by then. Will keep to focus on leg issues medically and see if can improve mentation slightly.  2.  Antithrombotics: -DVT/anticoagulation:  Pharmaceutical: Other (comment)--Eliquis             -antiplatelet therapy: N/A 3. Pain Management: Tylenol 4. Mood: pt struggling with diagnosis and end of life thoughts -would like for him to speak with neuropsychology while he's here -palliative care also would be appropriate.  -remeron trial : reduced to 7.5 mg due to concerns about sedation  Anxiety -on klonopin at reduced dose HS             -antipsychotic agents: N/A 12/19 pt seemed pretty alert today 12/22- pt less cooperative- not answering questions much today- will monitor trend.   -will add nicotine patch 5. Neuropsych: This patient is not fully capable of making decisions on his own behalf. 6. Skin/Wound Care:  Routine pressure relief measures             --Monitor elbow/BLE for healing                          -added warm moist compress to elbow bid, otherwise wrap with kerlex             -might benefit from a darco-type shoe for gait given his heel wounds  12/19 left elbow much improved today  12/22- will call vascular to see pt- their initial PA note suggests aortogram, possible intervention on LLE; UNA boot on RLE? Will see what Vascular surgeon suggests.  7. Fluids/Electrolytes/Severe protein deficient malutrition: Monitor I/O. Check CMET in am.              -add protein supplement             -continue remeron for mood/appetite  --sporadic right now 8. Metastatic no small cell lung cancer: Now on oxygen--using 3-4 liters. Will change order to allow O2 rate adjustment based on sats             --chemo/immunotherapy on hold              --  starting daily XRT starting today,Monday. As discussed with son, this will likely contribute to fatigue level  9. Urinary retention?:  Has been having difficulty voiding since July. CT showed thickened  bladder             --ua/ucx negative             --monitor voiding with bladder scan/PVR checks.  10. Septic olecranon bursitis: On Duricef 1000 mg bid for 2-3 weeks --Will need follow up with ID on 12/28 for input to finalize course of treatment.  -local care, arm looks better today 11. Leukocytosis: Felt to be steroid effect.   12/22- WBC 28.9- up from 25k- could be due to cancer, legs, steroids; is afebrile- on Diflucan for thrush, Duricef for septic arthritis of elbow and Decadron but only 1 mg- will recheck labs in AM and check U/A and Cx again- has been 6 days since checked.  12. Hyponatremia: Na continues to fluctuate from 120 at admission-->126-->124, urine Na 46 on 12/12--->Na+ 126 12/19  12/22- will recheck in AM 13. Anemia/Thrombocytopenia: Felt to be due to chemo. Monitor for signs of bleeding.  -daily CBC- stable/improving Hgb-->11.4 15. Candida esophagitis: On diflucan. Will schedule MMW qid as this was effective at home.  16.  Tachycardia EF is nl, did have similar tachy during ECHO, last EKG 12/12 showed sinus tach and inf infarct, no chest pain, 12/21 CBC anemia is stable Last BMET 12/19 had mildly increased BUN 17. Leg wounds-   12/22- aortogram UNA boot??? Waiting to hear from vascular-    I spent a total of 49 minutes on total care- >50% coordination of care- talking with [facility], Vascular and PA's  LOS: 6 days A FACE TO FACE EVALUATION WAS PERFORMED  Christopher Randall 12/10/2021, 12:38 PM

## 2021-12-10 NOTE — Progress Notes (Addendum)
Pt remained alert and oriented X3 but forgetful and made continuous attempt to get OOB for the first half of the shift going back and forth from chair to bed. PRN medication given for anxiety, pain, and sleep. Pt rested for very short period of times at times. This morning seems to be less impulsive but continues to set the bed alarm off while adjusting him self sitting at the edge of the bed and removing his oxygen thinking he is removing his glasses.

## 2021-12-10 NOTE — Progress Notes (Signed)
° °                                                                                                                                                          °  Patient Name: Christopher Randall MRN: [id number] DOB: 1961/02/12 Referring Physician: Sander Radon (Profile Not Attached) Date of Service: 10/23/2021 [facility], [place]                                                        End Of Treatment Note  Diagnoses: C79.31-Secondary malignant neoplasm of brain  Cancer Staging:  STAGE IV NSCLC  Intent: Palliative  Radiation Treatment Dates: 10/23/2021 through 10/23/2021 Site Technique Total Dose (Gy) Dose per Fx (Gy) Completed Fx Beam Energies  Brain: Brain_SRS IMRT 18/18 18 1/1 [id number]   Narrative: The patient tolerated radiation therapy relatively well.   Plan: The patient will follow-up with radiation oncology in 25mo .  -----------------------------------  Eppie Gibson, MD

## 2021-12-11 ENCOUNTER — Inpatient Hospital Stay (HOSPITAL_COMMUNITY): Payer: BC Managed Care – PPO

## 2021-12-11 ENCOUNTER — Inpatient Hospital Stay (HOSPITAL_COMMUNITY)
Admission: RE | Disposition: A | Discharge status: Home / Self Care | Payer: Self-pay | Source: Ambulatory Visit | Attending: Internal Medicine

## 2021-12-11 ENCOUNTER — Inpatient Hospital Stay (HOSPITAL_COMMUNITY)
Admission: RE | Admit: 2021-12-11 | Discharge: 2021-12-12 | DRG: 981 | Disposition: A | Discharge status: Home / Self Care | Payer: BC Managed Care – PPO | Source: Ambulatory Visit | Attending: Internal Medicine | Admitting: Internal Medicine

## 2021-12-11 ENCOUNTER — Encounter (HOSPITAL_COMMUNITY)
Admission: RE | Disposition: A | Discharge status: Home / Self Care | Payer: Self-pay | Source: Ambulatory Visit | Attending: Internal Medicine

## 2021-12-11 ENCOUNTER — Ambulatory Visit: Admission: RE | Admit: 2021-12-11 | Payer: BC Managed Care – PPO | Source: Ambulatory Visit

## 2021-12-11 ENCOUNTER — Encounter (HOSPITAL_COMMUNITY): Payer: Self-pay | Admitting: Vascular Surgery

## 2021-12-11 ENCOUNTER — Ambulatory Visit: Payer: BC Managed Care – PPO

## 2021-12-11 DIAGNOSIS — Z66 Do not resuscitate: Secondary | ICD-10-CM | POA: Diagnosis present

## 2021-12-11 DIAGNOSIS — E871 Hypo-osmolality and hyponatremia: Secondary | ICD-10-CM | POA: Diagnosis present

## 2021-12-11 DIAGNOSIS — Z91041 Radiographic dye allergy status: Secondary | ICD-10-CM

## 2021-12-11 DIAGNOSIS — I6340 Cerebral infarction due to embolism of unspecified cerebral artery: Secondary | ICD-10-CM | POA: Diagnosis not present

## 2021-12-11 DIAGNOSIS — C3411 Malignant neoplasm of upper lobe, right bronchus or lung: Secondary | ICD-10-CM | POA: Diagnosis present

## 2021-12-11 DIAGNOSIS — Z85820 Personal history of malignant melanoma of skin: Secondary | ICD-10-CM | POA: Diagnosis not present

## 2021-12-11 DIAGNOSIS — J9621 Acute and chronic respiratory failure with hypoxia: Secondary | ICD-10-CM | POA: Diagnosis present

## 2021-12-11 DIAGNOSIS — Z888 Allergy status to other drugs, medicaments and biological substances status: Secondary | ICD-10-CM

## 2021-12-11 DIAGNOSIS — I70223 Atherosclerosis of native arteries of extremities with rest pain, bilateral legs: Secondary | ICD-10-CM | POA: Diagnosis present

## 2021-12-11 DIAGNOSIS — Z7902 Long term (current) use of antithrombotics/antiplatelets: Secondary | ICD-10-CM | POA: Diagnosis not present

## 2021-12-11 DIAGNOSIS — Z8249 Family history of ischemic heart disease and other diseases of the circulatory system: Secondary | ICD-10-CM | POA: Diagnosis not present

## 2021-12-11 DIAGNOSIS — D649 Anemia, unspecified: Secondary | ICD-10-CM | POA: Diagnosis not present

## 2021-12-11 DIAGNOSIS — I252 Old myocardial infarction: Secondary | ICD-10-CM

## 2021-12-11 DIAGNOSIS — Z87891 Personal history of nicotine dependence: Secondary | ICD-10-CM | POA: Diagnosis not present

## 2021-12-11 DIAGNOSIS — Z515 Encounter for palliative care: Secondary | ICD-10-CM | POA: Diagnosis not present

## 2021-12-11 DIAGNOSIS — R0902 Hypoxemia: Secondary | ICD-10-CM

## 2021-12-11 DIAGNOSIS — G479 Sleep disorder, unspecified: Secondary | ICD-10-CM | POA: Diagnosis present

## 2021-12-11 DIAGNOSIS — J910 Malignant pleural effusion: Secondary | ICD-10-CM | POA: Diagnosis present

## 2021-12-11 DIAGNOSIS — F419 Anxiety disorder, unspecified: Secondary | ICD-10-CM | POA: Diagnosis not present

## 2021-12-11 DIAGNOSIS — E43 Unspecified severe protein-calorie malnutrition: Secondary | ICD-10-CM | POA: Diagnosis present

## 2021-12-11 DIAGNOSIS — F32A Depression, unspecified: Secondary | ICD-10-CM | POA: Diagnosis present

## 2021-12-11 DIAGNOSIS — I639 Cerebral infarction, unspecified: Secondary | ICD-10-CM | POA: Diagnosis present

## 2021-12-11 DIAGNOSIS — Z7189 Other specified counseling: Secondary | ICD-10-CM | POA: Diagnosis not present

## 2021-12-11 DIAGNOSIS — J9611 Chronic respiratory failure with hypoxia: Secondary | ICD-10-CM | POA: Diagnosis present

## 2021-12-11 DIAGNOSIS — Z9889 Other specified postprocedural states: Secondary | ICD-10-CM

## 2021-12-11 DIAGNOSIS — Z801 Family history of malignant neoplasm of trachea, bronchus and lung: Secondary | ICD-10-CM

## 2021-12-11 DIAGNOSIS — D696 Thrombocytopenia, unspecified: Secondary | ICD-10-CM | POA: Diagnosis present

## 2021-12-11 DIAGNOSIS — D6859 Other primary thrombophilia: Secondary | ICD-10-CM | POA: Diagnosis present

## 2021-12-11 DIAGNOSIS — I743 Embolism and thrombosis of arteries of the lower extremities: Secondary | ICD-10-CM | POA: Diagnosis not present

## 2021-12-11 DIAGNOSIS — E785 Hyperlipidemia, unspecified: Secondary | ICD-10-CM | POA: Diagnosis present

## 2021-12-11 DIAGNOSIS — J90 Pleural effusion, not elsewhere classified: Secondary | ICD-10-CM | POA: Diagnosis not present

## 2021-12-11 DIAGNOSIS — C7931 Secondary malignant neoplasm of brain: Secondary | ICD-10-CM | POA: Diagnosis present

## 2021-12-11 DIAGNOSIS — C3490 Malignant neoplasm of unspecified part of unspecified bronchus or lung: Secondary | ICD-10-CM | POA: Diagnosis not present

## 2021-12-11 DIAGNOSIS — L03116 Cellulitis of left lower limb: Secondary | ICD-10-CM | POA: Diagnosis present

## 2021-12-11 DIAGNOSIS — L03115 Cellulitis of right lower limb: Secondary | ICD-10-CM | POA: Diagnosis present

## 2021-12-11 DIAGNOSIS — Z79899 Other long term (current) drug therapy: Secondary | ICD-10-CM

## 2021-12-11 DIAGNOSIS — Z8673 Personal history of transient ischemic attack (TIA), and cerebral infarction without residual deficits: Secondary | ICD-10-CM

## 2021-12-11 HISTORY — PX: PERIPHERAL VASCULAR BALLOON ANGIOPLASTY: CATH118281

## 2021-12-11 HISTORY — PX: ABDOMINAL AORTOGRAM W/LOWER EXTREMITY: CATH118223

## 2021-12-11 LAB — CBC WITH DIFFERENTIAL/PLATELET
Abs Immature Granulocytes: 1.42 10*3/uL — ABNORMAL HIGH (ref 0.00–0.07)
Basophils Absolute: 0.1 10*3/uL (ref 0.0–0.1)
Basophils Relative: 1 %
Eosinophils Absolute: 0 10*3/uL (ref 0.0–0.5)
Eosinophils Relative: 0 %
HCT: 32.2 % — ABNORMAL LOW (ref 39.0–52.0)
Hemoglobin: 10.9 g/dL — ABNORMAL LOW (ref 13.0–17.0)
Immature Granulocytes: 6 %
Lymphocytes Relative: 5 %
Lymphs Abs: 1.3 10*3/uL (ref 0.7–4.0)
MCH: 31.6 pg (ref 26.0–34.0)
MCHC: 33.9 g/dL (ref 30.0–36.0)
MCV: 93.3 fL (ref 80.0–100.0)
Monocytes Absolute: 1.7 10*3/uL — ABNORMAL HIGH (ref 0.1–1.0)
Monocytes Relative: 7 %
Neutro Abs: 20.1 10*3/uL — ABNORMAL HIGH (ref 1.7–7.7)
Neutrophils Relative %: 81 %
Platelets: 73 10*3/uL — ABNORMAL LOW (ref 150–400)
RBC: 3.45 MIL/uL — ABNORMAL LOW (ref 4.22–5.81)
RDW: 18.7 % — ABNORMAL HIGH (ref 11.5–15.5)
WBC: 24.6 10*3/uL — ABNORMAL HIGH (ref 4.0–10.5)
nRBC: 0.9 % — ABNORMAL HIGH (ref 0.0–0.2)

## 2021-12-11 LAB — COMPREHENSIVE METABOLIC PANEL
ALT: 32 U/L (ref 0–44)
AST: 29 U/L (ref 15–41)
Albumin: 2 g/dL — ABNORMAL LOW (ref 3.5–5.0)
Alkaline Phosphatase: 72 U/L (ref 38–126)
Anion gap: 15 (ref 5–15)
BUN: 55 mg/dL — ABNORMAL HIGH (ref 6–20)
CO2: 26 mmol/L (ref 22–32)
Calcium: 7.9 mg/dL — ABNORMAL LOW (ref 8.9–10.3)
Chloride: 89 mmol/L — ABNORMAL LOW (ref 98–111)
Creatinine, Ser: 1.53 mg/dL — ABNORMAL HIGH (ref 0.61–1.24)
GFR, Estimated: 52 mL/min — ABNORMAL LOW (ref >60)
Glucose, Bld: 104 mg/dL — ABNORMAL HIGH (ref 70–99)
Potassium: 4.1 mmol/L (ref 3.5–5.1)
Sodium: 130 mmol/L — ABNORMAL LOW (ref 135–145)
Total Bilirubin: 0.8 mg/dL (ref 0.3–1.2)
Total Protein: 5 g/dL — ABNORMAL LOW (ref 6.5–8.1)

## 2021-12-11 LAB — MAGNESIUM: Magnesium: 2.3 mg/dL (ref 1.7–2.4)

## 2021-12-11 LAB — URINALYSIS, ROUTINE W REFLEX MICROSCOPIC
Bilirubin Urine: NEGATIVE
Glucose, UA: NEGATIVE mg/dL
Hgb urine dipstick: NEGATIVE
Ketones, ur: NEGATIVE mg/dL
Leukocytes,Ua: NEGATIVE
Nitrite: NEGATIVE
Protein, ur: NEGATIVE mg/dL
Specific Gravity, Urine: 1.023 (ref 1.005–1.030)
pH: 5 (ref 5.0–8.0)

## 2021-12-11 LAB — SARS CORONAVIRUS 2 BY RT PCR (HOSPITAL ORDER, PERFORMED IN ~~LOC~~ HOSPITAL LAB): SARS Coronavirus 2: NEGATIVE

## 2021-12-11 IMAGING — DX DG CHEST 1V PORT
1 series · 1 of 1 positions shown · non-contrast
Comparison: CT [DATE]

CLINICAL DATA: Chronic respiratory failure with hypoxia.

EXAM:
PORTABLE CHEST 1 VIEW

[chest]
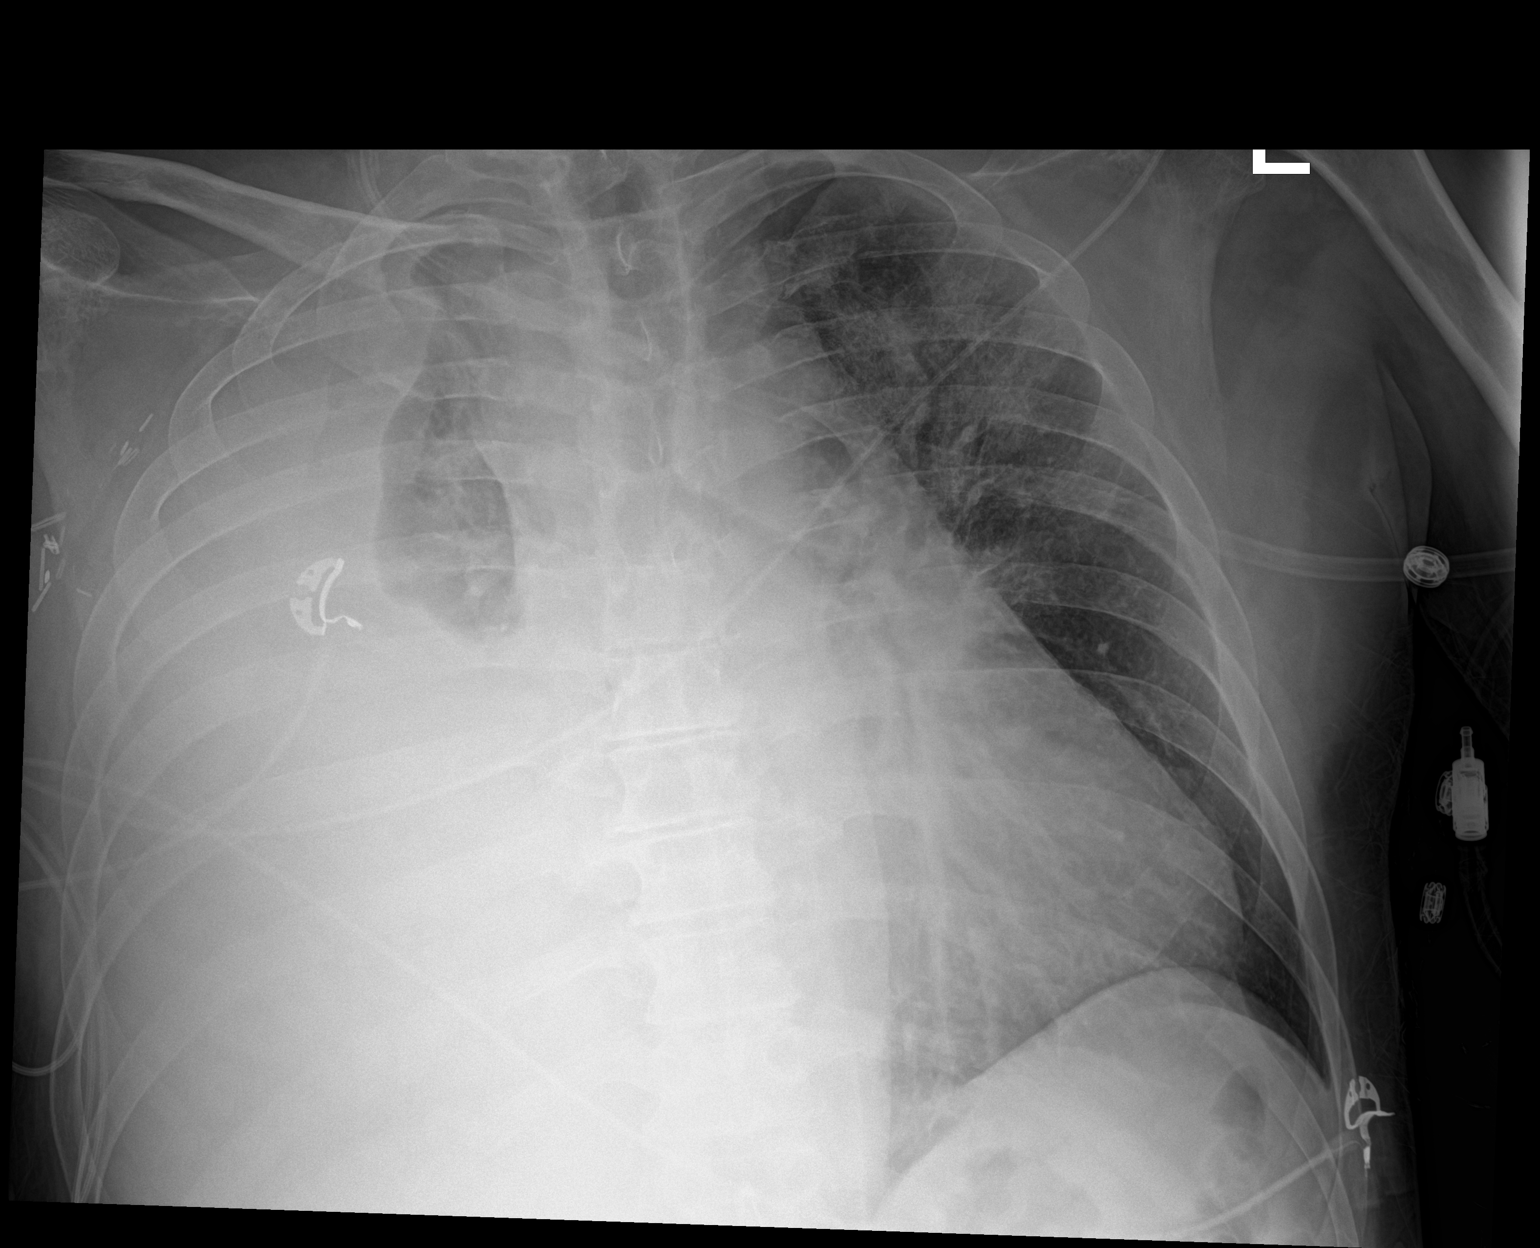

[1 of 1 positions shown; findings below may reference images not displayed]

FINDINGS: Right axillary surgical clips. Midline trachea. Moderate
cardiomegaly. Known adenopathy is not well evaluated. Enlargement of
a large right pleural effusion. No left-sided pleural fluid. No
pneumothorax.

Worsened right-sided aeration, with only minimal medial right upper
lobe residual aeration. Left upper lung predominant airspace disease
is relatively similar to the scout film from [DATE] CT.
IMPRESSION: Since the CT of [DATE], significantly worsened right-sided
aeration secondary to enlargement of a large right pleural effusion
with resultant near complete whiteout of the right hemithorax.

Cardiomegaly with relatively similar left upper lung predominant
airspace disease.

## 2021-12-11 SURGERY — ABDOMINAL AORTOGRAM W/LOWER EXTREMITY
Anesthesia: LOCAL

## 2021-12-11 MED ORDER — ATORVASTATIN CALCIUM 40 MG PO TABS
40.0000 mg | ORAL_TABLET | Freq: Every day | ORAL | Status: DC
  Filled 2021-12-11: qty 1

## 2021-12-11 MED ORDER — HEPARIN (PORCINE) IN NACL 1000-0.9 UT/500ML-% IV SOLN
INTRAVENOUS | Status: DC | PRN
  Administered 2021-12-11 (×2): 500 mL

## 2021-12-11 MED ORDER — FENTANYL CITRATE (PF) 100 MCG/2ML IJ SOLN
INTRAMUSCULAR | Status: AC
  Filled 2021-12-11: qty 2

## 2021-12-11 MED ORDER — SODIUM CHLORIDE 0.9% FLUSH
3.0000 mL | Freq: Two times a day (BID) | INTRAVENOUS | Status: DC
  Administered 2021-12-11: 23:00:00 3 mL via INTRAVENOUS

## 2021-12-11 MED ORDER — LIDOCAINE HCL (PF) 1 % IJ SOLN
INTRAMUSCULAR | Status: DC | PRN
  Administered 2021-12-11: 12 mL

## 2021-12-11 MED ORDER — FLUCONAZOLE 100 MG PO TABS
100.0000 mg | ORAL_TABLET | Freq: Every day | ORAL | Status: DC
  Filled 2021-12-11: qty 1

## 2021-12-11 MED ORDER — METOPROLOL TARTRATE 12.5 MG HALF TABLET
12.5000 mg | ORAL_TABLET | Freq: Two times a day (BID) | ORAL | Status: DC
  Administered 2021-12-11: 23:00:00 12.5 mg via ORAL
  Filled 2021-12-11: qty 1

## 2021-12-11 MED ORDER — SODIUM CHLORIDE 0.9 % WEIGHT BASED INFUSION: 1.0000 mL/kg/h | INTRAVENOUS | Status: DC

## 2021-12-11 MED ORDER — FENTANYL CITRATE (PF) 100 MCG/2ML IJ SOLN
INTRAMUSCULAR | Status: DC | PRN
  Administered 2021-12-11: 50 ug via INTRAVENOUS

## 2021-12-11 MED ORDER — TAMSULOSIN HCL 0.4 MG PO CAPS: 0.4000 mg | ORAL_CAPSULE | Freq: Every day | ORAL | Status: DC

## 2021-12-11 MED ORDER — LORAZEPAM 2 MG/ML IJ SOLN
1.0000 mg | Freq: Four times a day (QID) | INTRAMUSCULAR | Status: DC | PRN
  Administered 2021-12-12 (×2): 1 mg via INTRAVENOUS
  Filled 2021-12-11 (×2): qty 1

## 2021-12-11 MED ORDER — ONDANSETRON HCL 4 MG PO TABS: 4.0000 mg | ORAL_TABLET | Freq: Four times a day (QID) | ORAL | Status: DC | PRN

## 2021-12-11 MED ORDER — HEPARIN (PORCINE) IN NACL 1000-0.9 UT/500ML-% IV SOLN
INTRAVENOUS | Status: AC
  Filled 2021-12-11: qty 1000

## 2021-12-11 MED ORDER — MIDAZOLAM HCL 2 MG/2ML IJ SOLN
INTRAMUSCULAR | Status: AC
  Filled 2021-12-11: qty 2

## 2021-12-11 MED ORDER — FUROSEMIDE 10 MG/ML IJ SOLN
40.0000 mg | Freq: Once | INTRAMUSCULAR | Status: AC
Start: 2021-12-11 — End: 2021-12-11
  Administered 2021-12-11: 23:00:00 40 mg via INTRAVENOUS
  Filled 2021-12-11: qty 4

## 2021-12-11 MED ORDER — HYDROCODONE-ACETAMINOPHEN 5-325 MG PO TABS: 1.0000 | ORAL_TABLET | ORAL | Status: DC | PRN

## 2021-12-11 MED ORDER — ALBUTEROL SULFATE (2.5 MG/3ML) 0.083% IN NEBU: 2.5000 mg | INHALATION_SOLUTION | Freq: Four times a day (QID) | RESPIRATORY_TRACT | Status: DC | PRN

## 2021-12-11 MED ORDER — SODIUM CHLORIDE 0.9% FLUSH: 3.0000 mL | INTRAVENOUS | Status: DC | PRN

## 2021-12-11 MED ORDER — LIDOCAINE HCL (PF) 1 % IJ SOLN
INTRAMUSCULAR | Status: AC
  Filled 2021-12-11: qty 30

## 2021-12-11 MED ORDER — SODIUM CHLORIDE 0.9 % IV SOLN: INTRAVENOUS | Status: DC

## 2021-12-11 MED ORDER — PROSOURCE PLUS PO LIQD: 30.0000 mL | Freq: Two times a day (BID) | ORAL | Status: DC

## 2021-12-11 MED ORDER — CEFADROXIL 500 MG PO CAPS
1000.0000 mg | ORAL_CAPSULE | Freq: Two times a day (BID) | ORAL | Status: DC
  Filled 2021-12-11 (×2): qty 2

## 2021-12-11 MED ORDER — MEGESTROL ACETATE 40 MG PO TABS
160.0000 mg | ORAL_TABLET | Freq: Every day | ORAL | Status: DC
  Filled 2021-12-11 (×2): qty 4

## 2021-12-11 MED ORDER — HYDRALAZINE HCL 20 MG/ML IJ SOLN: 5.0000 mg | INTRAMUSCULAR | Status: DC | PRN

## 2021-12-11 MED ORDER — METHYLPREDNISOLONE SODIUM SUCC 125 MG IJ SOLR: 125.0000 mg | Freq: Once | INTRAMUSCULAR | Status: DC

## 2021-12-11 MED ORDER — ONDANSETRON HCL 4 MG/2ML IJ SOLN: 4.0000 mg | Freq: Four times a day (QID) | INTRAMUSCULAR | Status: DC | PRN

## 2021-12-11 MED ORDER — ACETAMINOPHEN 325 MG PO TABS: 650.0000 mg | ORAL_TABLET | ORAL | Status: DC | PRN

## 2021-12-11 MED ORDER — DIPHENHYDRAMINE HCL 50 MG/ML IJ SOLN
INTRAMUSCULAR | Status: AC
  Filled 2021-12-11: qty 1

## 2021-12-11 MED ORDER — HEPARIN SODIUM (PORCINE) 1000 UNIT/ML IJ SOLN
INTRAMUSCULAR | Status: AC
  Filled 2021-12-11: qty 10

## 2021-12-11 MED ORDER — FUROSEMIDE 10 MG/ML IJ SOLN: 40.0000 mg | Freq: Two times a day (BID) | INTRAMUSCULAR | Status: DC

## 2021-12-11 MED ORDER — ACETAMINOPHEN 325 MG PO TABS: 650.0000 mg | ORAL_TABLET | Freq: Four times a day (QID) | ORAL | Status: DC | PRN

## 2021-12-11 MED ORDER — HEPARIN SODIUM (PORCINE) 1000 UNIT/ML IJ SOLN
INTRAMUSCULAR | Status: DC | PRN
  Administered 2021-12-11: 8000 [IU] via INTRAVENOUS
  Administered 2021-12-11: 2000 [IU] via INTRAVENOUS

## 2021-12-11 MED ORDER — SODIUM CHLORIDE 0.9 % IV SOLN: 250.0000 mL | INTRAVENOUS | Status: DC | PRN

## 2021-12-11 MED ORDER — GUAIFENESIN ER 600 MG PO TB12: 1200.0000 mg | ORAL_TABLET | Freq: Two times a day (BID) | ORAL | Status: DC

## 2021-12-11 MED ORDER — LABETALOL HCL 5 MG/ML IV SOLN: 10.0000 mg | INTRAVENOUS | Status: DC | PRN

## 2021-12-11 MED ORDER — NAPHAZOLINE-PHENIRAMINE 0.025-0.3 % OP SOLN
1.0000 [drp] | Freq: Four times a day (QID) | OPHTHALMIC | Status: DC | PRN
  Filled 2021-12-11: qty 15

## 2021-12-11 MED ORDER — IODIXANOL 320 MG/ML IV SOLN
INTRAVENOUS | Status: DC | PRN
  Administered 2021-12-11: 13:00:00 130 mL via INTRA_ARTERIAL

## 2021-12-11 MED ORDER — CLONAZEPAM 0.25 MG PO TBDP
0.5000 mg | ORAL_TABLET | Freq: Two times a day (BID) | ORAL | Status: DC | PRN
  Administered 2021-12-11: 23:00:00 0.5 mg via ORAL
  Filled 2021-12-11: qty 2

## 2021-12-11 MED ORDER — MAGIC MOUTHWASH W/LIDOCAINE
5.0000 mL | Freq: Every day | ORAL | Status: DC | PRN
  Filled 2021-12-11: qty 5

## 2021-12-11 MED ORDER — IPRATROPIUM-ALBUTEROL 0.5-2.5 (3) MG/3ML IN SOLN
3.0000 mL | RESPIRATORY_TRACT | Status: DC | PRN
  Administered 2021-12-12: 3 mL via RESPIRATORY_TRACT
  Filled 2021-12-11: qty 3

## 2021-12-11 MED ORDER — CLOPIDOGREL BISULFATE 75 MG PO TABS: 75.0000 mg | ORAL_TABLET | Freq: Every day | ORAL | Status: DC

## 2021-12-11 MED ORDER — ACETAMINOPHEN 650 MG RE SUPP: 650.0000 mg | Freq: Four times a day (QID) | RECTAL | Status: DC | PRN

## 2021-12-11 MED ORDER — MIDAZOLAM HCL 2 MG/2ML IJ SOLN
INTRAMUSCULAR | Status: DC | PRN
  Administered 2021-12-11: 1 mg via INTRAVENOUS

## 2021-12-11 MED ORDER — DIPHENHYDRAMINE HCL 50 MG/ML IJ SOLN
INTRAMUSCULAR | Status: DC | PRN
  Administered 2021-12-11: 25 mg

## 2021-12-11 MED ORDER — SILVER SULFADIAZINE 1 % EX CREA
TOPICAL_CREAM | Freq: Two times a day (BID) | CUTANEOUS | Status: DC
  Filled 2021-12-11: qty 85

## 2021-12-11 MED ORDER — METHYLPREDNISOLONE SODIUM SUCC 125 MG IJ SOLR
INTRAMUSCULAR | Status: AC
  Filled 2021-12-11: qty 2

## 2021-12-11 MED ORDER — SALINE SPRAY 0.65 % NA SOLN
1.0000 | Freq: Every day | NASAL | Status: DC | PRN
  Filled 2021-12-11: qty 44

## 2021-12-11 MED ORDER — DIPHENHYDRAMINE HCL 50 MG/ML IJ SOLN
25.0000 mg | Freq: Once | INTRAMUSCULAR | Status: DC
  Filled 2021-12-11: qty 0.5

## 2021-12-11 MED ORDER — LIDOCAINE VISCOUS HCL 2 % MT SOLN: 5.0000 mL | Freq: Every day | OROMUCOSAL | Status: DC | PRN

## 2021-12-11 MED ORDER — METHYLPREDNISOLONE SODIUM SUCC 125 MG IJ SOLR
INTRAMUSCULAR | Status: DC | PRN
  Administered 2021-12-11: 125 mg

## 2021-12-11 MED ORDER — LIDOCAINE-EPINEPHRINE 1 %-1:100000 IJ SOLN
INTRAMUSCULAR | Status: AC
  Filled 2021-12-11: qty 1

## 2021-12-11 MED ORDER — ENOXAPARIN SODIUM 100 MG/ML IJ SOSY
1.0000 mg/kg | PREFILLED_SYRINGE | Freq: Two times a day (BID) | INTRAMUSCULAR | Status: DC
  Filled 2021-12-11 (×2): qty 0.85

## 2021-12-11 MED ORDER — SODIUM CHLORIDE 0.9% FLUSH: 3.0000 mL | Freq: Two times a day (BID) | INTRAVENOUS | Status: DC

## 2021-12-11 SURGICAL SUPPLY — 23 items
BALL STERLING OTW 2.5X150X150 (BALLOONS) ×3
BALLN STERLING OTW 2.5X150X150 (BALLOONS) ×2
BALLN STERLING OTW 3X150X150 (BALLOONS) ×3
BALLOON STERLING OTW 3X150X150 (BALLOONS) IMPLANT
BALLOON STRLNG OTW 2.5X150X150 (BALLOONS) IMPLANT
CATH 0.018 NAVICROSS ANG 135 (CATHETERS) ×1 IMPLANT
CATH CXI SUPP 2.6F 150 ST (CATHETERS) ×1 IMPLANT
CATH OMNI FLUSH 5F 65CM (CATHETERS) ×1 IMPLANT
CATH QUICKCROSS ANG SELECT (CATHETERS) ×1 IMPLANT
CLOSURE PERCLOSE PROSTYLE (VASCULAR PRODUCTS) ×1 IMPLANT
GLIDEWIRE ADV .035X260CM (WIRE) ×1 IMPLANT
KIT ENCORE 26 ADVANTAGE (KITS) ×1 IMPLANT
KIT PV (KITS) ×3 IMPLANT
PATCH THROMBIX TOPICAL PLAIN (HEMOSTASIS) ×1 IMPLANT
SHEATH PINNACLE 5F 10CM (SHEATH) ×1 IMPLANT
SHEATH PINNACLE ST 6F 65CM (SHEATH) ×1 IMPLANT
SHEATH PROBE COVER 6X72 (BAG) ×1 IMPLANT
SYR MEDRAD MARK V 150ML (SYRINGE) ×1 IMPLANT
TRANSDUCER W/STOPCOCK (MISCELLANEOUS) ×3 IMPLANT
TRAY PV CATH (CUSTOM PROCEDURE TRAY) ×3 IMPLANT
WIRE BENTSON .035X145CM (WIRE) ×1 IMPLANT
WIRE G V18X300CM (WIRE) ×1 IMPLANT
WIRE MICRO SET 5FR 12 (WIRE) ×1 IMPLANT

## 2021-12-11 NOTE — Progress Notes (Signed)
Occupational Therapy Note  Patient Details  Name: Christopher Randall MRN: [id number] Date of Birth: 1961-03-10  Occupational Therapy Discharge Note  This patient was unable to complete the inpatient rehab program due to unplanned procedure requiring DC from CIR; therefore did not meet their long term goals. Pt left the program at a min A assist level for their  functional ADLs. This patient is being discharged from OT services at this time.  BIMS at time of d/c  Pt unable to complete due to medical status  See CareTool for functional status details.  If the patient is able to return to inpatient rehabilitation within 3 midnights, this may be considered an interrupted stay and therapy services will resume as ordered. Modification and reinstatement of their goals will be made upon completion of therapy service reevaluations.      Volanda Napoleon MS, OTR/L  12/11/2021, 12:50 PM

## 2021-12-11 NOTE — Progress Notes (Addendum)
Inpatient Rehabilitation Discharge Medication Review by a Pharmacist  A complete drug regimen review was completed for this patient to identify any potential clinically significant medication issues.  High Risk Drug Classes Is patient taking? Indication by Medication  Antipsychotic No   Anticoagulant Yes Lovenox for VTE ppx  Antibiotic Yes Duricef/fluconazole for UTI/thrush  Opioid Yes Vicodin for pain  Antiplatelet Yes Plavix  Hypoglycemics/insulin No   Vasoactive Medication Yes Furosemide, metoprolol for BP  Chemotherapy No   Other No      Type of Medication Issue Identified Description of Issue Recommendation(s)  Drug Interaction(s) (clinically significant)     Duplicate Therapy     Allergy     No Medication Administration End Date     Incorrect Dose     Additional Drug Therapy Needed     Significant med changes from prior encounter (inform family/care partners about these prior to discharge).    Other       Clinically significant medication issues were identified that warrant physician communication and completion of prescribed/recommended actions by midnight of the next day:  No  Pharmacist comments: None / Discharged back to acute side  Time spent performing this drug regimen review (minutes):  20 minutes   Tad Moore 12/11/2021 2:22 PM

## 2021-12-11 NOTE — Progress Notes (Signed)
2215: called to pt's room to check on right groin site. Gauze dry and intact, no bleeding. Soft to touch, no hematoma noted. Pt was very anxious, he kept saying he cant breath, and he needs to get out of the room. Pt's wife was talking to DR. Tamala Julian about x-ray result and possible treatment options. Raised pt's head  to 45 degrees progressively without any bleeding. O2 sat 100% in 4l o2. There is barely any lung sound in RU lobe, none in RL lobe. Lasix administered per MD order, PRN clonopin administered. Pt is not in state of taking other po meds. Thoracentesis for am.   2240: pt is staying anxious on and off trying to get out of bed. Pt's wife by bedside worried, and crying. Asking if patient can have stronger medication for anxiety. Per pt's wife, pt has been anxious at night and today has been worse.  Pt kept getting anxious, pulled condom cath out, wanted to walk to bathroom, helped pt with bedside comode. No bleeding noted on right groin site. Pt is still sitting in the bedside. Wife with patient.  2330: paged on call MD dr. Alcario Drought. Notified of pt's current situation. Received oredr for bi-pap and ativan . Use bi-pap first per MD. Will continue to monitor.

## 2021-12-11 NOTE — Op Note (Addendum)
Patient name: Christopher Randall MRN: [id number] DOB: 1961-07-14 Sex: male  12/11/2021 Pre-operative Diagnosis: Bilateral lower extremity Ruther[place] 5 critical limb ischemia Post-operative diagnosis:  Same Surgeon:  Broadus John, MD Procedure Performed: 1.  Ultrasound-guided micropuncture access of the right common femoral artery 2.  Aortogram 3.  Second-order cannulation, left lower extremity angiogram 4.  Balloon angioplasty of the left peroneal and posterior tibial arteries using a 3 x 150 mm balloon 5.  Right lower extremity angiogram 6.  Device assisted closure, Perclose 74 minutes moderate sedation 187ml contrast  Indications: Patient is a 60 year old male with several month history of bilateral lower extremity wounds.  These have shown minimal improvement with wound care.  Recent ABIs demonstrated peripheral arterial disease left greater than right.  After discussing the risks and benefits of bilateral lower extremity angiogram in an effort to improve distal perfusion, improve wound healing, Christopher Randall elected to proceed.  Findings:  Aortogram: Bilateral renal arteries patent, normal infrarenal abdominal aorta, bilateral common iliac arteries normal, bilateral hypogastric arteries normal, bilateral external iliac arteries normal  On the right: Normal common femoral artery, normal profunda, normal superficial femoral artery, normal popliteal artery.  Below the knee, there is single-vessel posterior tibial artery runoff to the foot, with slow filling anterior tibial artery.  On the left: Normal common femoral artery normal profunda superficial femoral artery with mild disease, no flow-limiting stenosis appreciated.  Normal popliteal artery.  Outflow demonstrates occlusive thrombus in all 3 tibial vessels.  The thrombus is primarily proximal and found 2 cm beyond the anterior tibial artery takeoff, throughout the tibioperoneal trunk, proximal posterior tibial artery, proximal peroneal.   Runoff to the foot is posterior tibial artery dominant, however this is sluggish due to proximal thrombus.   Procedure:  The patient was identified in the holding area and taken to room 8.  The patient was then placed supine on the table and prepped and draped in the usual sterile fashion.  A time out was called.  Ultrasound was used to evaluate the right common femoral artery.  It was patent .  A digital ultrasound image was acquired.  A micropuncture needle was used to access the right common femoral artery under ultrasound guidance.  An 018 wire was advanced without resistance and a micropuncture sheath was placed.  The 018 wire was removed and a benson wire was placed.  The micropuncture sheath was exchanged for a 5 french sheath.  An omniflush catheter was advanced over the wire to the level of L-1.  An abdominal angiogram was obtained.  Next, using the omniflush catheter and a benson wire, the aortic bifurcation was crossed and the catheter was placed into theleft external iliac artery and left runoff was obtained.  right runoff was performed via retrograde sheath injections.  I made the decision to attempt left lower extremity tibial intervention.  The short 5 French catheter was exchanged for a 6 x 65 cm sheath.  This was placed into the superficial femoral artery and the patient was heparinized with 8000 units of intra-arterial heparin.  Next, I used an 018 wire and catheter system to traverse the peroneal lesion.  The peroneal artery was balloon angioplasty using a 2.5 x 150 mm balloon along its entirety.  The balloon inflated without issue.  There was no wasting.  However on follow-up angiography, there was no improvement in the lumen size.  Furthermore the areas of filling changed slightly due to mobile thrombus.  Next, my attention turned to the posterior  tibial artery.  Again, this artery was occluded proximally.  A 3 x 150 mm balloon was brought onto the field and inflated along the proximal  posterior tibial artery and into the tibioperoneal trunk.  Similar to the peroneal artery, there was no waisting, the balloon inflated without issue.  On deflation, there was no improvement, however there were other areas distally that appeared to have thrombus.  The way this thrombus behaved is indicative of old thrombus.  Patient is not a candidate for lysis, and due to the age of thrombus and size the arteries, I did not believe mechanical suction thrombectomy would be beneficial.   Impression: Patient has single-vessel posterior tibial artery runoff to the foot right foot.  Left foot runoff is hampered by thrombus present in all 3 tibial vessels.  Intervention did not improve the patient's arterial perfusion to the foot.  The pretibial wounds are not characteristic of those associated with ischemic tissue loss.  While these wounds may have difficulty healing, the etiology of his wounds should be worked up further. I had a long discussion with the patient as well as his wife and daughter.  The only operation I can offer at this point is bilateral above-knee amputations should the patient become septic.  With the patient's metastatic cancer, I feel it best that he have continued wound care and lifelong suppressive antibiotics in an effort to keep his lower extremities.    Christopher Santee, MD [facility] Office: [phone]

## 2021-12-11 NOTE — Progress Notes (Signed)
PROGRESS NOTE   Subjective/Complaints:  Per pt's wife, who's at bedside; pt more sleepy- but restful- LBM was 2 days ago per wife, but yesterday per pt. Is documented yesterday.   Per nurse, LLE looks worse- more edematous and darker tissue than yesterday- Not urinating much per wirfe and per documentation, but wife also mentioned that he's not drinking well- wife wants to send back to acute to focus on medical issues- not rehab.    ROS: limited by cognition   Objective:   MR BRAIN WO CONTRAST  Result Date: 12/09/2021 CLINICAL DATA:  History of recently diagnosed lung cancer with brain metastatic disease, undergoing chemotherapy admitted for altered mental status EXAM: MRI HEAD WITHOUT CONTRAST TECHNIQUE: Multiplanar, multiecho pulse sequences of the brain and surrounding structures were obtained without intravenous contrast. COMPARISON:  MR head 11/27/2021, MRA head 11/28/2021 FINDINGS: The study was terminated before axial T1 images could be obtained. Brain: There are numerous foci of diffusion restriction with associated T2/FLAIR signal abnormality in the bilateral cerebellar hemispheres (left significantly worse than right), left dorsal midbrain, and bilateral parietal lobes consistent with acute infarct. Faint foci of diffusion restriction in the bilateral frontal lobes may reflect small subacute infarcts. There is no associated hemorrhage. Again seen is an extra-axial lesion along the right aspect of the falx near the vertex measuring up to approximately 0.9 cm AP on the FLAIR sequence, not significantly changed in size when measured on the FLAIR sequence on the prior study from 11/27/2021 (5-27). There is mild edema in the underlying parasagittal frontal lobe, unchanged. The previously seen enhancing lesion in the left parasagittal frontal lobe is not seen on the current noncontrast study. Parenchymal volume is stable. The ventricles  are unchanged in size. Scattered additional foci of FLAIR signal abnormality in the subcortical and periventricular white matter likely reflects sequela of mild chronic white matter microangiopathy. There is no midline shift. Vascular: Normal flow voids. Skull and upper cervical spine: Normal marrow signal. Sinuses/Orbits: The paranasal sinuses are clear. The globes and orbits are unremarkable. Other: None. IMPRESSION: 1. Numerous foci of acute infarct in the bilateral cerebellar hemispheres (left worse than right), midbrain, and bilateral parietal lobes, again favored to be embolic in etiology. 2. Probable small subacute infarcts in the bilateral frontal lobes. 3. No significant interval change in size of the extra-axial lesion along the right aspect of the falx near the vertex when measured on the FLAIR sequence. The smaller lesion in the left parasagittal frontal lobe is not identified on this noncontrast study. Electronically Signed   By: Valetta Mole M.D.   On: 12/09/2021 13:08   VAS [place] ABI WITH/WO TBI  Result Date: 12/10/2021  LOWER EXTREMITY DOPPLER STUDY Patient Name:  Christopher Randall  Date of Exam:   12/10/2021 Medical Rec #: [id number]          Accession #:    [id number] Date of Birth: [birth year]          Patient Gender: M Patient Age:   60 years Exam Location:  [facility] Procedure:      VAS [place] ABI WITH/WO TBI Referring Phys: Servando Snare --------------------------------------------------------------------------------  Indications: Peripheral artery disease. High Risk  Factors: Past history of smoking, prior MI. Other Factors: Emolic CVA, Cancer patient on chemotherapy.  Comparison Study: No previous exams Performing Technologist: Hill, Jody RVT, RDMS  Examination Guidelines: A complete evaluation includes at minimum, Doppler waveform signals and systolic blood pressure reading at the level of bilateral brachial, anterior tibial, and posterior tibial arteries, when vessel segments are  accessible. Bilateral testing is considered an integral part of a complete examination. Photoelectric Plethysmograph (PPG) waveforms and toe systolic pressure readings are included as required and additional duplex testing as needed. Limited examinations for reoccurring indications may be performed as noted.  ABI Findings: +--------+------------------+-----+---------+--------+  Right    Rt Pressure (mmHg) Index Waveform  Comment   +--------+------------------+-----+---------+--------+  Brachial 126                      triphasic           +--------+------------------+-----+---------+--------+  PTA      164                1.30  triphasic           +--------+------------------+-----+---------+--------+  DP       135                1.07  biphasic            +--------+------------------+-----+---------+--------+ +--------+------------------+-----+----------+---------------------------+  Left     Lt Pressure (mmHg) Index Waveform   Comment                      +--------+------------------+-----+----------+---------------------------+  Brachial                          triphasic  IVx2                         +--------+------------------+-----+----------+---------------------------+  PTA                                          Not visualized - open wound  +--------+------------------+-----+----------+---------------------------+  DP       53                 0.42  monophasic                              +--------+------------------+-----+----------+---------------------------+ +-------+-----------+-----------+------------+------------+  ABI/TBI Today's ABI Today's TBI Previous ABI Previous TBI  +-------+-----------+-----------+------------+------------+  Right   1.30        0                                      +-------+-----------+-----------+------------+------------+  Left    0.42        0                                      +-------+-----------+-----------+------------+------------+  Unable to exam LLE PT due to open  wounds.    Preliminary    Recent Labs    12/10/21 0551 12/11/21 0536  WBC 28.9* 24.6*  HGB 11.8* 10.9*  HCT 35.8* 32.2*  PLT 61* 73*   Recent Labs    12/11/21  0536  NA 130*  K 4.1  CL 89*  CO2 26  GLUCOSE 104*  BUN 55*  CREATININE 1.53*  CALCIUM 7.9*    Intake/Output Summary (Last 24 hours) at 12/11/2021 0843 Last data filed at 12/11/2021 0602 Gross per 24 hour  Intake 360 ml  Output 150 ml  Net 210 ml     Pressure Injury 12/04/21 Arm Lower;Posterior;Proximal;Right Stage 1 -  Intact skin with non-blanchable redness of a localized area usually over a bony prominence. reddness to the right elbow (Active)  12/04/21 1710  Location: Arm  Location Orientation: Lower;Posterior;Proximal;Right  Staging: Stage 1 -  Intact skin with non-blanchable redness of a localized area usually over a bony prominence.  Wound Description (Comments): reddness to the right elbow  Present on Admission: Yes    Physical Exam: Vital Signs Blood pressure 112/75, pulse (!) 106, temperature 97.6 F (36.4 C), resp. rate 20, weight 85.1 kg, SpO2 93 %.    General: awake, sleepy- sitting at EOB; wearing O2 by [place]; wife at bedside; NAD HENT: conjugate gaze; oropharynx dry Pulmonary:decreased diffusely- L>R- no W/R/R GI: soft, NT, ND, (+)BS Psychiatric: appropriate Neurological: Ox1-2- confused, but sleepy Skin: ACE wrap bilateral legs, foot/ankle wounds, left elbw looks much better with decreased swelling as well. B/L leg wounds assessed by vascular pictures- areas of what appears to be necrosis- per vascular notes today Neurologic:Alert , oriented person, hospital, year  , month but not day or date Cranial nerves II through XII intact, motor strength is 5/5 in bilateral deltoid, bicep, tricep, grip, hip flexor, knee extensors, ankle dorsiflexor and plantar flexor Sensory exam normal sensation to light touch  in bilateral upper and lower extremities  Musculoskeletal: Full range of motion in all 4  extremities. No joint swelling    Assessment/Plan: 1. Functional deficits which require 3+ hours per day of interdisciplinary therapy in a comprehensive inpatient rehab setting. Physiatrist is providing close team supervision and 24 hour management of active medical problems listed below. Physiatrist and rehab team continue to assess barriers to discharge/monitor patient progress toward functional and medical goals  Care Tool:  Bathing    Body parts bathed by patient: Face, Right arm, Left arm, Chest, Abdomen, Front perineal area, Buttocks, Right upper leg, Left upper leg, Right lower leg, Left lower leg         Bathing assist Assist Level: Contact Guard/Touching assist (stance)     Upper Body Dressing/Undressing Upper body dressing   What is the patient wearing?: Pull over shirt    Upper body assist Assist Level: Supervision/Verbal cueing    Lower Body Dressing/Undressing Lower body dressing      What is the patient wearing?: Pants     Lower body assist Assist for lower body dressing: Moderate Assistance - Patient 50 - 74%     Toileting Toileting    Toileting assist Assist for toileting: Minimal Assistance - Patient > 75%     Transfers Chair/bed transfer  Transfers assist     Chair/bed transfer assist level: Minimal Assistance - Patient > 75%     Locomotion Ambulation   Ambulation assist      Assist level: Contact Guard/Touching assist Assistive device: Walker-rolling Max distance: 50   Walk 10 feet activity   Assist     Assist level: Contact Guard/Touching assist Assistive device: Walker-rolling   Walk 50 feet activity   Assist    Assist level: Contact Guard/Touching assist Assistive device: Walker-rolling    Walk 150 feet activity   Assist  Walk 150 feet activity did not occur: Safety/medical concerns         Walk 10 feet on uneven surface  activity   Assist Walk 10 feet on uneven surfaces activity did not occur:  Safety/medical concerns         Wheelchair     Assist Is the patient using a wheelchair?: Yes Type of Wheelchair: Manual    Wheelchair assist level: Contact Guard/Touching assist Max wheelchair distance: 40    Wheelchair 50 feet with 2 turns activity    Assist    Wheelchair 50 feet with 2 turns activity did not occur:  (per PT documentation)   Assist Level: Minimal Assistance - Patient > 75%   Wheelchair 150 feet activity     Assist  Wheelchair 150 feet activity did not occur:  (per PT documentation)   Assist Level: Maximal Assistance - Patient 25 - 49%   Blood pressure 112/75, pulse (!) 106, temperature 97.6 F (36.4 C), resp. rate 20, weight 85.1 kg, SpO2 93 %.  Medical Problem List and Plan: 1. 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 Order palliative consult   12/23- Arteriogram and radiation today- also being seen by palliative care-  2.  Antithrombotics: -DVT/anticoagulation:  Pharmaceutical: Other (comment)--Eliquis             -antiplatelet therapy: N/A 3. Pain Management: Tylenol 4. Mood: pt struggling with diagnosis and end of life thoughts -would like for him to speak with neuropsychology while he's here -palliative care also would be appropriate.  -remeron trial : reduced to 7.5 mg due to concerns about sedation  Anxiety -on klonopin at reduced dose HS             -antipsychotic agents: N/A 12/19 pt seemed pretty alert today 12/23- will see if possible for neuropsych to see?  -will add nicotine patch 5. Neuropsych: This patient is not fully capable of making decisions on his own behalf. 6. Skin/Wound Care:  Routine pressure relief measures             --Monitor elbow/BLE for healing                          -added warm moist compress to elbow bid, otherwise wrap with  kerlex             -might benefit from a darco-type shoe for gait given his heel wounds  12/19 left elbow much improved today  12/22- will call vascular to see pt- their initial PA note suggests aortogram, possible intervention on LLE; UNA boot on RLE? Will see what Vascular surgeon suggests.   12/23- ABI 0.42 on L- going for arteriogram today at 9:30am by vascular- will see plan after that.  7. Fluids/Electrolytes/Severe protein deficient malutrition: Monitor I/O. Check CMET in am.              -add protein supplement             -continue remeron for mood/appetite  --sporadic right now 8. Metastatic no small cell lung cancer: Now on oxygen--using 3-4 liters. Will change order to allow O2 rate adjustment based on sats             --chemo/immunotherapy on hold             --  starting daily XRT starting today,Monday. As discussed with son, this will likely contribute to fatigue level  9. Urinary retention?:  Has been having difficulty voiding since July. CT showed thickened bladder             --ua/ucx negative             --monitor voiding with bladder scan/PVR checks.   12/23- voiding, but poor intake- poor output-  10. Septic olecranon bursitis: On Duricef 1000 mg bid for 2-3 weeks --Will need follow up with ID on 12/28 for input to finalize course of treatment.  -local care, arm looks better today 11. Leukocytosis: Felt to be steroid effect.   12/22- WBC 28.9- up from 25k- could be due to cancer, legs, steroids; is afebrile- on Diflucan for thrush, Duricef for septic arthritis of elbow and Decadron but only 1 mg- will recheck labs in AM and check U/A and Cx again- has been 6 days since checked.   12/23- U/A (-)- WBC down to 24k- con't to monitor 12. Hyponatremia: Na continues to fluctuate from 120 at admission-->126-->124, urine Na 46 on 12/12--->Na+ 126 12/19  12/22- will recheck in AM  12/23- Na up to 130-  13. Anemia/Thrombocytopenia: Felt to be due to chemo. Monitor for signs of  bleeding.  -daily CBC- stable/improving Hgb-->11.4 15. Candida esophagitis: On diflucan. Will schedule MMW qid as this was effective at home.  16.  Tachycardia EF is nl, did have similar tachy during ECHO, last EKG 12/12 showed sinus tach and inf infarct, no chest pain, 12/21 CBC anemia is stable Last BMET 12/19 had mildly increased BUN 17. Leg wounds-   12/22- aortogram UNA boot??? Waiting to hear from vascular-   12/23- ABI wasn't great for LLE- going for arteriogram today at 9:30am- is NPO- on IVFs 18. Severe malnutrition- poor intake  Will wait today to add marinol, since is NPO, however would possibly need- consulting IM and see what they think about Cortrak vs NGT- albumin 2.0-  19. ARI-  12/23- not drinking- Cr up to 1.53 from 0.88 and BUN 55 from 24- will recheck daily- on IVFs- will continue for now.    Will ask IM to help due to all the complex issues with renal issues, vascular/leg wounds; poor appetite, etc.     I spent a total of 41 minutes on total care- >50% coordination of care- talking with WOC, neuropsych, as well as wife and calling IM consult- for prolonged period- plan for arteriogram today   LOS: 7 days A FACE TO FACE EVALUATION WAS PERFORMED  Faizah Kandler 12/11/2021, 8:43 AM

## 2021-12-11 NOTE — Progress Notes (Signed)
[place] for transition from apixaban to lovenox Indication: bilateral DVT  Allergies  Allergen Reactions   Iopamidol Hives and Itching    Isovue break through with prep, Hives and Itching  Patient had PET?CT scan with 125 mls of Isovue-300. Noted to have two raised itchy hives which self-resolved. Patient had PET/CT scan with contrast on 05/27/2014 while on prednisone prep (3 doses of 50 mg-13 hour prep) and noted to have two hives which self -resolved without treatment   Iodinated Diagnostic Agents Rash   Iodine-131 Rash    Patient Measurements: Weight: 85.1 kg (187 lb 9.8 oz) Lovenox Dosing Weight: 84.8 kg  Vital Signs: Temp: 97.6 F (36.4 C) (12/23 0348) BP: 112/75 (12/23 0353) Pulse Rate: 106 (12/23 0353)  Labs: Recent Labs    12/09/21 0518 12/10/21 0551 12/11/21 0536  HGB 11.2* 11.8* 10.9*  HCT 32.4* 35.8* 32.2*  PLT 65* 61* 73*  CREATININE  --   --  1.53*     Estimated Creatinine Clearance: 56.3 mL/min (A) (by C-G formula based on SCr of 1.53 mg/dL (H)).   Medical History: Past Medical History:  Diagnosis Date   Abnormal TSH 09/28/2017   Anemia 11/27/2021   Anxiety 07/03/2012   Depression    Difficulty sleeping 09/28/2017   Examination of participant in clinical trial 09/11/2012   History of immunotherapy 02/23/2016   Melanoma of skin 10/26/2011   Nicotine dependence 07/17/2015   Smoker    STEMI (ST elevation myocardial infarction) ([place]) 11/27/2021    Assessment: Transition from apixaban to lovenox for B/L DVT secondary to hypercoagulable state in the setting of NSCLCa failing apixaban therapy. Oncology and Attending are aware of thrombocytopenia.  Goal of Therapy:   Monitor platelets by anticoagulation protocol: Yes   Plan:  Continue Lovenox 85 mg sq Q 12 hours Watch CBC  Thank you Anette Guarneri, PharmD Clinical Pharmacist 12/11/2021,8:35 AM
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
## 2021-12-11 NOTE — CV Procedure (Signed)
Clotting patch that had been applied had become saturated after patient set up. Patch was removed to reveal new ooze. Pressure was held by [facility] for 30 min's and new thrombix patch was applied. Site is level 0 with no hematoma. Slight bruising is noted around site.

## 2021-12-11 NOTE — CV Procedure (Signed)
Dr. Stanford Breed Came and accessed Right groin site. There was new ooze.  Pressure was held for 10 mins and He applied 5 ml's of Lidocaine with epi. No new oozing. New dressing was applied

## 2021-12-11 NOTE — H&P (Addendum)
History and Physical    Christopher Randall DJT:[id number] DOB: August 22, 1961 DOA: 12/11/2021  Referring MD/NP/PA: Reesa Chew, PA-C PCP: Fredirick Lathe, PA-C  Patient coming from: from Cath lab  Chief Complaint: shortness of breath  I have personally briefly reviewed patient's old medical records in [place]   HPI: Christopher Randall is a 60 y.o. 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   Hospitalized from 12/9-12/17 in after MRI showed numerous small acute to subacute infarcts involving multiple vascular territories suggestive of central embolic source.  He was found to have acute DVT on the right and left gastrocnemius vein.  During his hospital stay patient was noted to have significant hyponatremia with initial sodium 119, abnormal LFTs, leukocytosis with WBC 67K, thrombocytopenia with platelets 29K, high-sensitivity troponin 2168, and chest x-ray showing bilateral airspace disease with right-sided pleural effusion concerning for multifocal pneumonia.  Started on heparin drip for concern for STEMI after EKG had been able obtained and admitted to critical care service.  He had also been started on empiric antibiotics for treatment of a pneumonia and concern for cellulitis.  EF was noted to be around 55% with small to moderate pericardial effusion with no signs of tamponade.  Cardiology had evaluated the patient and felt to me due to a stress-induced cardiomyopathy and was not a candidate for invasive angiography and recommended medical management with Plavix and beta-blocker.  Dr. Marin Olp has been consulted for input with low sodium but felt likely related with dehydration and treatment.  Neurology was also on board and felt symptoms were likely embolic related with patient's hypercoagulable state  with advanced malignancy.  Noted on 12/12 to have concern for septic bursitis of the elbow and underwent aspiration for which cultures grew out abundant staph aureus.   CT scan of the chest from 12/14 noted worsening of extensive mediastinal and hilar adenopathy with enlargement of the supraclavicular and abdominal retroperitoneal lymph nodes with enlarging right upper lobe mass collapse in the right middle lobe and effacing the proximal bronchi, cardiomegaly with increased pericardial effusion as well as interstitial groundglass opacities concerning for pneumonia versus a leptomeninges carcinomatosis.Recommended to continue Duricef 5 mg twice daily for 2 to 3 weeks starting 12/15 to follow-up with ID in outpatient setting.  He was transitioned to Eliquis for secondary stroke  prevention.   He had been admitted into [facility] rehab on 12/04/2021 for continued services with PT/OT, and speech.  However patient was noted to have issues with poor intake despite multiple nutritional supplements offered.  Patient was noted to have issues with confusion.  Doses of Klonopin and Remeron were decreased with some improvement in alertness.  He had ongoing issues with thrush for which Magic mouthwash was added to Diflucan to help milligray symptoms.  Bladder scans were ordered and ultimately patient had catheter placed due to continued urinary tension.  He had been continue to go to [facility] for radiation treatment.  MRI of the brain showed new left cerebellar and midbrain infarcts that did not appear to be symptomatic, but likely a failure of Eliquis.  After talks with Dr. Mickeal Skinner and Dr. Leonie Man patient was transferred treatment dose Lovenox in the setting of apixaban failure and hypercoagulable state associated with non-small cell lung cancer.  Patient's oxygenation was noted to fluctuate during his time there and was requiring oxygen continuously.  Lesions  of the bilateral extremity were noted to continue to evolve and decline in  appearance.  Vascular surgery has been consulted for further input and took the patient for a arteriogram to see if he would be amendable to stenting versus angioplasty.  Arteriogram was performed today but intervention did not improve the patient's arterial perfusion to the foot although vascular surgery thought wounds were secondary to something else and suggested possible further work-up.  Vascular surgery recommended bilateral above-knee amputations if the patient should become septic, but otherwise recommended continued wound care with lifelong suppressive antibiotics in effort to keep his lower extremities. Labs today significant for WBC 24.6, hemoglobin 10.9, platelets 73, sodium 130, BUN 55, creatinine 1.53, calcium 7.9, and albumin 2.  Following his procedure today patient was noted to have O2 saturations as low as 87% on 2 L nasal cannula oxygen and had to be increased up to 5 L to maintain O2 saturations.  Patient complained of feeling as though he was suffocating when laying flat.  ED Course: As seen above.  Review of Systems  Unable to perform ROS: Medical condition  Constitutional:  Positive for malaise/fatigue.  Respiratory:  Positive for cough and shortness of breath.   Cardiovascular:  Positive for orthopnea.  Psychiatric/Behavioral:  The patient is nervous/anxious.    Past Medical History:  Diagnosis Date   Abnormal TSH 09/28/2017   Anemia 11/27/2021   Anxiety 07/03/2012   Depression    Difficulty sleeping 09/28/2017   Examination of participant in clinical trial 09/11/2012   History of immunotherapy 02/23/2016   Melanoma of skin 10/26/2011   Nicotine dependence 07/17/2015   Smoker    STEMI (ST elevation myocardial infarction) ([place]) 11/27/2021    Past Surgical History:  Procedure Laterality Date   BRONCHIAL BIOPSY  10/09/2021   Procedure: BRONCHIAL BIOPSIES;  Surgeon: [name], MD;  Location: [facility];  Service: Endoscopy;;   BRONCHIAL BRUSHINGS  10/09/2021    Procedure: BRONCHIAL BRUSHINGS;  Surgeon: [name], MD;  Location: [facility];  Service: Endoscopy;;   ENDOBRONCHIAL ULTRASOUND N/A 10/09/2021   Procedure: ENDOBRONCHIAL ULTRASOUND;  Surgeon: [name], MD;  Location: [facility];  Service: Endoscopy;  Laterality: N/A;   FINE NEEDLE ASPIRATION  10/09/2021   Procedure: FINE NEEDLE ASPIRATION (FNA) LINEAR;  Surgeon: [name], MD;  Location: [facility];  Service: Endoscopy;;   LEFT POPLITEAL MASS RESECTION  08/27/2011   pigmented villonodular synovitis, negative for malignancy   LYMPH GLAND EXCISION  07/12/2011   right axilla, consistent with melanoma   LYMPH NODE DISSECTION  08/27/2011   right, 0/17 nodes positive   VIDEO BRONCHOSCOPY N/A 10/09/2021   Procedure: VIDEO BRONCHOSCOPY WITHOUT FLUORO;  Surgeon: [name], MD;  Location: [facility];  Service: Endoscopy;  Laterality: N/A;     reports that he has quit smoking. His smoking use included cigarettes. He smoked an average of .25 packs per day. He has never used smokeless tobacco. He reports current alcohol use of about 6.0 standard drinks per week. He reports that he does not use drugs.  Allergies  Allergen Reactions   Iopamidol Hives and Itching    Isovue break through with prep, Hives and Itching  Patient had PET?CT scan with 125 mls of Isovue-300. Noted to have two raised itchy hives which self-resolved. Patient had PET/CT scan with contrast on 05/27/2014 while on prednisone prep (3 doses of 50 mg-13 hour prep) and noted to have two hives which self -resolved without treatment   Iodinated  Contrast Media Rash   Iodine-131 Rash    Family History  Problem Relation Age of Onset   Hypertension Mother    CAD Mother    Lung cancer Father     Prior to Admission medications   Medication Sig Start Date End Date Taking? Authorizing Provider  acetaminophen (TYLENOL) 325 MG tablet Take 1-2 tablets (325-650 mg total) by mouth every 4 (four)  hours as needed for mild pain. 12/10/21   Love, Ivan Anchors, PA-C  atorvastatin (LIPITOR) 40 MG tablet Take 1 tablet (40 mg total) by mouth daily. 12/05/21   Terrilee Croak, MD  cefadroxil (DURICEF) 500 MG capsule Take 2 capsules (1,000 mg total) by mouth 2 (two) times daily. 12/04/21   Terrilee Croak, MD  clonazePAM (KLONOPIN) 0.5 MG tablet Take 1 tablet (0.5 mg total) by mouth 2 (two) times daily as needed (anxiety). 12/04/21   Terrilee Croak, MD  clopidogrel (PLAVIX) 75 MG tablet Take 1 tablet (75 mg total) by mouth daily. 12/05/21   Terrilee Croak, MD  dexamethasone (DECADRON) 1 MG tablet Take 1 tablet (1 mg total) by mouth daily with breakfast. 11/24/21   Vaslow, Acey Lav, MD  fluconazole (DIFLUCAN) 100 MG tablet Take 2 tablets today, then 1 tablet daily x 20 more days. Patient taking differently: Take 100 mg by mouth daily. 11/09/21   Eppie Gibson, MD  folic acid (FOLVITE) 1 MG tablet Take 1 tablet (1 mg total) by mouth daily. Start 7 days before pemetrexed chemotherapy. Continue until 21 days after pemetrexed completed. 11/17/21   Brunetta Genera, MD  furosemide (LASIX) 40 MG tablet Take 1 tablet (40 mg total) by mouth daily. 12/05/21   Terrilee Croak, MD  guaiFENesin (MUCINEX) 600 MG 12 hr tablet Take 2 tablets (1,200 mg total) by mouth 2 (two) times daily. 12/04/21   Terrilee Croak, MD  HYDROcodone-acetaminophen (NORCO/VICODIN) 5-325 MG tablet Take 1-2 tablets by mouth every 4 (four) hours as needed for moderate pain. 12/04/21   Terrilee Croak, MD  magic mouthwash (lidocaine, diphenhydrAMINE, alum & mag hydroxide) suspension Swish and spit 5 mls by mouth every 4-6 hours as needed Patient taking differently: Swish and spit 5 mLs daily as needed for mouth pain. 11/20/21   Brunetta Genera, MD  megestrol (MEGACE) 40 MG tablet Take 4 tablets (160 mg total) by mouth daily. 12/05/21   Terrilee Croak, MD  metoprolol tartrate (LOPRESSOR) 25 MG tablet Take 0.5 tablets (12.5 mg total) by mouth 2 (two)  times daily. 12/04/21   Dahal, Marlowe Aschoff, MD  naphazoline-pheniramine (VISINE) 0.025-0.3 % ophthalmic solution 1 drop 4 (four) times daily as needed for eye irritation.    [provider]  nicotine polacrilex (COMMIT) 4 MG lozenge Take 4 mg by mouth 2 (two) times daily as needed for smoking cessation.    [provider]  polyethylene glycol (MIRALAX / GLYCOLAX) 17 g packet Take 17 g by mouth daily as needed for moderate constipation. 12/04/21   Terrilee Croak, MD  prochlorperazine (COMPAZINE) 10 MG tablet Take 1 tablet (10 mg total) by mouth every 6 (six) hours as needed (Nausea or vomiting). Patient taking differently: Take 10 mg by mouth every 6 (six) hours as needed for nausea or vomiting. 11/17/21   Brunetta Genera, MD  silver sulfADIAZINE (SILVADENE) 1 % cream Apply topically 2 (two) times daily. 12/04/21   Terrilee Croak, MD  sodium chloride (OCEAN) 0.65 % SOLN nasal spray Place 1 spray into both nostrils daily as needed for congestion.    [provider]  vitamin B-12 (CYANOCOBALAMIN) 1000 MCG tablet Take 1,000 mcg by mouth daily.    [provider]    Physical Exam:  Constitutional:  Male who appears chronically ill and older than stated age  89:   12/11/21 1102  SpO2: 91%   Eyes: PERRL, lids and conjunctivae normal ENMT: Mucous membranes are moist. Posterior pharynx clear of any exudate or lesions.  Neck: normal, supple, no masses, no thyromegaly Respiratory: Tachypneic with decreased aeration noted on the right lung field and rales noted on the left lung field.  Currently on 5 L of nasal cannula oxygen with O2 saturations maintained.   Cardiovascular: Tachycardic. No carotid bruits.  Abdomen: no tenderness, no masses palpated. No hepatosplenomegaly. Bowel sounds positive.  Musculoskeletal: no clubbing / cyanosis. No joint deformity upper and lower extremities.  Skin: Bilateral lower extremity wounds currently wrapped. Neurologic: CN 2-12  grossly intact. Sensation intact, DTR normal. Strength 5/5 in all 4.  Speech is a little slurred at times. Psychiatric: Normal judgment and insight. Alert and oriented x 3. Normal mood.     Labs on Admission: I have personally reviewed following labs and imaging studies  CBC: Recent Labs  Lab 12/05/21 0506 12/06/21 0515 12/07/21 0654 12/08/21 0550 12/09/21 0518 12/10/21 0551 12/11/21 0536  WBC 36.2*   < > 30.3* 27.3* 25.7* 28.9* 24.6*  NEUTROABS 28.9*  --   --   --   --   --  20.1*  HGB 10.5*   < > 11.4* 11.1* 11.2* 11.8* 10.9*  HCT 30.2*   < > 32.5* 31.4* 32.4* 35.8* 32.2*  MCV 90.7   < > 91.3 90.5 91.5 94.2 93.3  PLT 46*   < > 61* 56* 65* 61* 73*   < > = values in this interval not displayed.   Basic Metabolic Panel: Recent Labs  Lab 12/05/21 0506 12/07/21 0654 12/11/21 0536  NA 126* 126* 130*  K 3.8 3.6 4.1  CL 88* 88* 89*  CO2 27 29 26   GLUCOSE 113* 100* 104*  BUN 20 24* 55*  CREATININE 0.91 0.88 1.53*  CALCIUM 8.0* 7.9* 7.9*   GFR: Estimated Creatinine Clearance: 56.3 mL/min (A) (by C-G formula based on SCr of 1.53 mg/dL (H)). Liver Function Tests: Recent Labs  Lab 12/05/21 0506 12/11/21 0536  AST 34 29  ALT 35 32  ALKPHOS 87 72  BILITOT 0.6 0.8  PROT 4.8* 5.0*  ALBUMIN 2.0* 2.0*   No results for input(s): LIPASE, AMYLASE in the last 168 hours. No results for input(s): AMMONIA in the last 168 hours. Coagulation Profile: No results for input(s): INR, PROTIME in the last 168 hours. Cardiac Enzymes: No results for input(s): CKTOTAL, CKMB, CKMBINDEX, TROPONINI in the last 168 hours. BNP (last 3 results) No results for input(s): PROBNP in the last 8760 hours. HbA1C: No results for input(s): HGBA1C in the last 72 hours. CBG: No results for input(s): GLUCAP in the last 168 hours. Lipid Profile: No results for input(s): CHOL, HDL, LDLCALC, TRIG, CHOLHDL, LDLDIRECT in the last 72 hours. Thyroid Function Tests: No results for input(s): TSH, T4TOTAL,  FREET4, T3FREE, THYROIDAB in the last 72 hours. Anemia Panel: No results for input(s): VITAMINB12, FOLATE, FERRITIN, TIBC, IRON, RETICCTPCT in the last 72 hours. Urine analysis:    Component Value Date/Time   COLORURINE YELLOW 12/11/2021 [place] 12/11/2021 0344   LABSPEC 1.023 12/11/2021 0344   PHURINE 5.0 12/11/2021 0344   GLUCOSEU NEGATIVE 12/11/2021 0344   HGBUR NEGATIVE  12/11/2021 [place] 12/11/2021 [place] 12/11/2021 0344   PROTEINUR NEGATIVE 12/11/2021 0344   NITRITE NEGATIVE 12/11/2021 0344   LEUKOCYTESUR NEGATIVE 12/11/2021 0344   Sepsis Labs: Recent Results (from the past 240 hour(s))  Surgical PCR screen     Status: Abnormal   Collection Time: 12/02/21  8:50 PM   Specimen: Nasal Mucosa; Nasal Swab  Result Value Ref Range Status   MRSA, PCR NEGATIVE NEGATIVE Final   Staphylococcus aureus POSITIVE (A) NEGATIVE Final    Comment: (NOTE) The Xpert SA Assay (FDA approved for NASAL specimens in patients 49 years of age and older), is one component of a comprehensive surveillance program. It is not intended to diagnose infection nor to guide or monitor treatment. Performed at [facility], [place] [address] [place], [place] [address]   Urine Culture     Status: None   Collection Time: 12/05/21  2:40 AM   Specimen: Urine, Clean Catch  Result Value Ref Range Status   Specimen Description URINE, CLEAN CATCH  Final   Special Requests NONE  Final   Culture   Final    NO GROWTH Performed at [facility], [address], [place], [place] [address]    Report Status 12/06/2021 FINAL  Final  SARS Coronavirus 2 by RT PCR (hospital order, performed in [facility] hospital lab) Nasopharyngeal Nasopharyngeal Swab     Status: None   Collection Time: 12/11/21  3:18 AM   Specimen: Nasopharyngeal Swab  Result Value Ref Range Status   SARS Coronavirus 2 NEGATIVE NEGATIVE Final    Comment: (NOTE) SARS-CoV-2 target  nucleic acids are NOT DETECTED.  The SARS-CoV-2 RNA is generally detectable in upper and lower respiratory specimens during the acute phase of infection. The lowest concentration of SARS-CoV-2 viral copies this assay can detect is 250 copies / mL. A negative result does not preclude SARS-CoV-2 infection and should not be used as the sole basis for treatment or other patient management decisions.  A negative result may occur with improper specimen collection / handling, submission of specimen other than nasopharyngeal swab, presence of viral mutation(s) within the areas targeted by this assay, and inadequate number of viral copies (<250 copies / mL). A negative result must be combined with clinical observations, patient history, and epidemiological information.  Fact Sheet for Patients:   StrictlyIdeas.no  Fact Sheet for Healthcare Providers: BankingDealers.co.za  This test is not yet approved or  cleared by the [place] FDA and has been authorized for detection and/or diagnosis of SARS-CoV-2 by FDA under an Emergency Use Authorization (EUA).  This EUA will remain in effect (meaning this test can be used) for the duration of the COVID-19 declaration under Section 564(b)(1) of the Act, 21 U.S.C. section 360bbb-3(b)(1), unless the authorization is terminated or revoked sooner.  Performed at [facility], [place] [address] [place], [place] [address]      Radiological Exams on Admission: VAS [place] ABI WITH/WO TBI  Result Date: 12/10/2021  LOWER EXTREMITY DOPPLER STUDY Patient Name:  MAKELL CYR  Date of Exam:   12/10/2021 Medical Rec #: [id number]          Accession #:    [id number] Date of Birth: [birth year]          Patient Gender: M Patient Age:   54 years Exam Location:  [facility] Procedure:      VAS [place] ABI WITH/WO TBI Referring Phys: Servando Snare  --------------------------------------------------------------------------------  Indications: Peripheral artery disease. High Risk Factors: Past history of smoking, prior MI. Other Factors: Emolic CVA, Cancer patient on chemotherapy.  Comparison Study: No previous exams Performing Technologist: Hill, Jody RVT, RDMS  Examination Guidelines: A complete evaluation includes at minimum, Doppler waveform signals and systolic blood pressure reading at the level of bilateral brachial, anterior tibial, and posterior tibial arteries, when vessel segments are accessible. Bilateral testing is considered an integral part of a complete examination. Photoelectric Plethysmograph (PPG) waveforms and toe systolic pressure readings are included as required and additional duplex testing as needed. Limited examinations for reoccurring indications may be performed as noted.  ABI Findings: +--------+------------------+-----+---------+--------+  Right    Rt Pressure (mmHg) Index Waveform  Comment   +--------+------------------+-----+---------+--------+  Brachial 126                      triphasic           +--------+------------------+-----+---------+--------+  PTA      164                1.30  triphasic           +--------+------------------+-----+---------+--------+  DP       135                1.07  biphasic            +--------+------------------+-----+---------+--------+ +--------+------------------+-----+----------+---------------------------+  Left     Lt Pressure (mmHg) Index Waveform   Comment                      +--------+------------------+-----+----------+---------------------------+  Brachial                          triphasic  IVx2                         +--------+------------------+-----+----------+---------------------------+  PTA                                          Not visualized - open wound  +--------+------------------+-----+----------+---------------------------+  DP       53                 0.42  monophasic                               +--------+------------------+-----+----------+---------------------------+ +-------+-----------+-----------+------------+------------+  ABI/TBI Today's ABI Today's TBI Previous ABI Previous TBI  +-------+-----------+-----------+------------+------------+  Right   1.30        0                                      +-------+-----------+-----------+------------+------------+  Left    0.42        0                                      +-------+-----------+-----------+------------+------------+  Unable to exam LLE PT due to open wounds.    Preliminary     CHEST xray: Independently reviewed.  Near complete opacification of the right lung field  Assessment/Plan Respiratory failure with hypoxia 2/2 lung cancer with pleural effusion: Acute on  chronic.  Patient noted to have increasing work of breathing with O2 saturations noted to be as low as 87% on 2 L , 90% on 4 L, and had to be increased to 5 L of nasal cannula oxygen to maintain O2 saturations greater than 92%.  Decreased air sounds noted on the right lung field.  CT scan of the chest 12/14 noted extensive worsening of mediastinal and hilar adenopathy left supraclavicular and abdominal retroperitoneal nodes enlarging right upper lobe mass which now appears to collapse the right middle lobe and effaces his proximal bronchi cardiomegaly with increased pericardial increase venous distention and moderate right and left pleural effusions and interstitial edema in the lung apices and bases. -Admit to a telemetry bed -Continuous pulse oximetry with nasal cannula oxygen to maintain O2 saturation greater than 92% -Check chest x-ray(concern for enlarged right pleural effusion with resultant near complete whiteout of the right hemithorax) -Lasix 40 mg IV x1 dose and reassess effectiveness in a.m. -Palliative care consulted given patient's poor overall prognosis in the setting of progression of metastatic disease to spite of treatments.    -Orders placed for IR thoracentesis  Embolic CVAs: MRI from 68/12 noted numerous foci of acute infarct at the bilateral cerebral hemispheres (left worse than the right), midbrain, and bilateral parietal lobes.  Neurology had been notified.  Previous MRA had noted distal R vA occlusion and right ACA stenosis.  Strokes thought to be embolic in nature.  As he had been on Eliquis and Plavix after strokes from 12/9.  Neurology recommended transitioning patient to Lovenox in addition to continuing Plavix. -Continue Plavix as long as platelet count stay greater than 50 -PT/OT/speech to evaluate and treat  Septic olecranon bursitis: Aspirate of the left elbow from 12/12 significant for staph aureus.  ID was on board and recommended Duricef 1000 mg twice daily for 2 to 3 weeks. -Continue Duricef 1000 mg twice daily -Follow-up with ID on 12/28 for input to finalize course of treatment  Leukocytosis: WBC 24.6, but appears to be possibly trending down.  -Continue to monitor  Acute kidney injury: Creatinine elevated at 1.53 with BUN 55.  The elevated BUN to creatinine ratio suggest prerenal cause of symptoms.  Initial orders have been placed for IV fluids, but subsequently discontinued due to worsening pleural effusions. -Recheck kidney function tomorrow  Normocytic anemia: Acute on chronic.  Hemoglobin 10.9 g/dL. -Continue to monitor H&H  Hyponatremia: chronic.  Sodium 130 on readmission which is appears improved from previous 126 on 12/19.  Thought to be multifactorial in nature. -Continue to monitor  Bilateral DVTs  lower extremity wounds: Patient had initially admitted found to have acute DVTs of the bilateral gastrocnemius veins on 12/10.  Vascular surgery had been consulted in rehab due to worsening appearance of bilateral lower extremity wounds.  Patient was taken for arteriogram 12/23. Intervention did not improve the patient's arterial perfusion to the foot.  Vascular surgery recommended  bilateral above-knee amputations if the patient should become septic, but otherwise recommended continued wound care with lifelong suppressive antibiotics in effort to keep his lower extremities.  -Continue Lovenox -Continue wound care orders of lower extremities: Cleanse with soap and water, apply Silvadene, cover with nonadherent dressing, cover with ABD pads and secure with Kerlix.  Hyperlipidemia -Continue Lipitor  Thrombocytopenia: Platelet count 73 and has slowly been trending up. -Continue to monitor  BPH -Continue Flomax  Severe protein calorie malnutrition: On admission albumin noted to be 2. -Continue Prosource supplements twice daily with meals  DVT  prophylaxis: Therapeutic Lovenox Code Status: Full  Family Communication: Discussed patient's overall care with his wife over the phone Disposition Plan: To be determined Consults called: Palliative care Admission status: inpatient   Norval Morton MD [facility]   If 7PM-7AM, please contact night-coverage   12/11/2021, 12:06 PM

## 2021-12-11 NOTE — Plan of Care (Signed)
Not met due to inability to complete CIR program due to unexpected DC. ° °Problem: RH Balance °Goal: LTG Patient will maintain dynamic standing with ADLs (OT) °Description: LTG:  Patient will maintain dynamic standing balance with assist during activities of daily living (OT)  °Outcome: Not Met (add Reason) °  °Problem: Sit to Stand °Goal: LTG:  Patient will perform sit to stand in prep for activites of daily living with assistance level (OT) °Description: LTG:  Patient will perform sit to stand in prep for activites of daily living with assistance level (OT) °Outcome: Not Met (add Reason) °  °Problem: RH Eating °Goal: LTG Patient will perform eating w/assist, cues/equip (OT) °Description: LTG: Patient will perform eating with assist, with/without cues using equipment (OT) °Outcome: Not Met (add Reason) °  °Problem: RH Grooming °Goal: LTG Patient will perform grooming w/assist,cues/equip (OT) °Description: LTG: Patient will perform grooming with assist, with/without cues using equipment (OT) °Outcome: Not Met (add Reason) °  °Problem: RH Bathing °Goal: LTG Patient will bathe all body parts with assist levels (OT) °Description: LTG: Patient will bathe all body parts with assist levels (OT) °Outcome: Not Met (add Reason) °  °Problem: RH Dressing °Goal: LTG Patient will perform upper body dressing (OT) °Description: LTG Patient will perform upper body dressing with assist, with/without cues (OT). °Outcome: Not Met (add Reason) °Goal: LTG Patient will perform lower body dressing w/assist (OT) °Description: LTG: Patient will perform lower body dressing with assist, with/without cues in positioning using equipment (OT) °Outcome: Not Met (add Reason) °  °Problem: RH Toileting °Goal: LTG Patient will perform toileting task (3/3 steps) with assistance level (OT) °Description: LTG: Patient will perform toileting task (3/3 steps) with assistance level (OT)  °Outcome: Not Met (add Reason) °  °Problem: RH Simple Meal Prep °Goal:  LTG Patient will perform simple meal prep w/assist (OT) °Description: LTG: Patient will perform simple meal prep with assistance, with/without cues (OT). °Outcome: Not Met (add Reason) °  °Problem: RH Light Housekeeping °Goal: LTG Patient will perform light housekeeping w/assist (OT) °Description: LTG: Patient will perform light housekeeping with assistance, with/without cues (OT). °Outcome: Not Met (add Reason) °  °Problem: RH Toilet Transfers °Goal: LTG Patient will perform toilet transfers w/assist (OT) °Description: LTG: Patient will perform toilet transfers with assist, with/without cues using equipment (OT) °Outcome: Not Met (add Reason) °  °Problem: RH Tub/Shower Transfers °Goal: LTG Patient will perform tub/shower transfers w/assist (OT) °Description: LTG: Patient will perform tub/shower transfers with assist, with/without cues using equipment (OT) °Outcome: Not Met (add Reason) °  °Problem: RH Furniture Transfers °Goal: LTG Patient will perform furniture transfers w/assist (OT/PT) °Description: LTG: Patient will perform furniture transfers  with assistance (OT/PT). °Outcome: Not Met (add Reason) °  °Problem: RH Furniture Transfers °Goal: LTG Patient will perform furniture transfers w/assist (OT/PT) °Description: LTG: Patient will perform furniture transfers  with assistance (OT/PT). °Outcome: Not Met (add Reason) °  °

## 2021-12-11 NOTE — Plan of Care (Signed)
Problem: RH Problem Solving Goal: LTG Patient will demonstrate problem solving for (SLP) Description: LTG:  Patient will demonstrate problem solving for basic/complex daily situations with cues  (SLP) Outcome: Not Met (add Reason) Note: Goal not met due to unplanned discharge   Problem: RH Memory Goal: LTG Patient will use memory compensatory aids to (SLP) Description: LTG:  Patient will use memory compensatory aids to recall biographical/new, daily complex information with cues (SLP) Outcome: Not Met (add Reason) Note: Goal not met due to unplanned discharge   Problem: RH Attention Goal: LTG Patient will demonstrate this level of attention during functional activites (SLP) Description: LTG:  Patient will will demonstrate this level of attention during functional activites (SLP) Outcome: Not Met (add Reason) Note: Goal not met due to unplanned discharge   Problem: RH Awareness Goal: LTG: Patient will demonstrate awareness during functional activites type of (SLP) Description: LTG: Patient will demonstrate awareness during functional activites type of (SLP) Outcome: Not Met (add Reason) Note: Goal not met due to unplanned discharge

## 2021-12-11 NOTE — Progress Notes (Signed)
At bedside shift report pt's dressing to right groin was little more saturated than before per day shift RN. It was marked, there was dried blood in the groin but no visible bleeding. When went to check back on pt at 2010, the gauze dressing was completely saturated with visible bright red blood at groin site. Old dressing removed, patient was constantly oozing at the site. Pressure applied from 2011-2102, pt was agitated and wanted to sit up. Educated patient on importance of bed rest and laying on his back. Pt is on 5l o2, laying flat Is an issue, pt's head was 13 degrees , complained he could not breath, o2 was 100%. Head raised to 20 degrees for now. Family by bedside. Oozing is stopped for now, gauze dressing applied. Charge RN was by bedside. Vitals stable except HR in 115. Will continue to monitor.

## 2021-12-11 NOTE — Progress Notes (Signed)
Physical Therapy Note  Patient Details  Name: Christopher Randall MRN: [id number] Date of Birth: 04-May-1961 Today's Date: 12/11/2021  Physical Therapy Discharge Note  This patient was unable to complete the inpatient rehab program due to d/c to acute care for procedure; therefore did not meet their long term goals. Pt left the program at a min assist level for their functional mobility/ transfers. This patient is being discharged from PT services at this time.  Pt's perception of pain in the last five days was unable to answer at this time.    See CareTool for functional status details  If the patient is able to return to inpatient rehabilitation within 3 midnights, this may be considered an interrupted stay and therapy services will resume as ordered. Modification and reinstatement of their goals will be made upon completion of therapy service reevaluations.     Lorie Phenix 12/11/2021, 5:07 PM

## 2021-12-11 NOTE — Progress Notes (Signed)
Occupational Therapy Session Note  Patient Details  Name: Christopher Randall MRN: [id number] Date of Birth: 07-28-1961  Today's Date: 12/11/2021 OT Individual Time: 9030-0923 OT Individual Time Calculation (min): 21 min  and Today's Date: 12/11/2021 OT Missed Time: 84 Minutes Missed Time Reason: Other (comment) (unexpected DC/ taken for procedure)   Short Term Goals: Week 1:  OT Short Term Goal 1 (Week 1): STG = LTG 2/2 ELOS  Skilled Therapeutic Interventions/Progress Updates:    Session 1 (3007-6226) : Pt received seated EOB with family present, no c/o pain, agreeable to therapy. Guided pt through PLB, but sats only increasing to 77%. LPN made aware. Family with questions re rehab progress/goals. Discussed current limitations including significant fatigue , increased HR, and low SatO2 impairing therapy participation. Transport team then present to take pt for procedure, relating that pt will have to be discharged from rehab due to overnight observation required.  Complete short ambulatory transfer > stretcher with CGA and RW. Able to achieve supine with light min A for BLE management.   Pt left with family and transport team/LPN.     Therapy Documentation Precautions:  Precautions Precautions: Fall Precaution Comments: L elbow septic bursitis, B heel wounds, monitor SpO2, decreased cognition Restrictions Weight Bearing Restrictions: No  Pain: no c/o   ADL: See Care Tool for more details.  Therapy/Group: Individual Therapy  Volanda Napoleon MS, OTR/L  12/11/2021, 6:56 AM

## 2021-12-11 NOTE — Progress Notes (Addendum)
ANTICOAGULATION CONSULT NOTE  Pharmacy Consult for: Enoxaparin Indication: Bilateral DVT Treatment  Allergies  Allergen Reactions   Iopamidol Hives and Itching    Isovue break through with prep, Hives and Itching  Patient had PET?CT scan with 125 mls of Isovue-300. Noted to have two raised itchy hives which self-resolved. Patient had PET/CT scan with contrast on 05/27/2014 while on prednisone prep (3 doses of 50 mg-13 hour prep) and noted to have two hives which self -resolved without treatment   Iodinated Contrast Media Rash   Iodine-131 Rash    Patient Measurements: Lovenox Dosing Weight: 85 kg  Vital Signs: BP: 119/84 (12/23 1227) Pulse Rate: 115 (12/23 1231)  Labs: Recent Labs    12/09/21 0518 12/10/21 0551 12/11/21 0536  HGB 11.2* 11.8* 10.9*  HCT 32.4* 35.8* 32.2*  PLT 65* 61* 73*  CREATININE  --   --  1.53*    Estimated Creatinine Clearance: 56.3 mL/min (A) (by C-G formula based on SCr of 1.53 mg/dL (H)).   Medical History: Past Medical History:  Diagnosis Date   Abnormal TSH 09/28/2017   Anemia 11/27/2021   Anxiety 07/03/2012   Depression    Difficulty sleeping 09/28/2017   Examination of participant in clinical trial 09/11/2012   History of immunotherapy 02/23/2016   Melanoma of skin 10/26/2011   Nicotine dependence 07/17/2015   Smoker    STEMI (ST elevation myocardial infarction) ([place]) 11/27/2021    Assessment: 60 yr old with bilateral DVT (secondary to hypercoagulable state in setting of NSCL with brain mets) after failing apixaban therapy. Pt was transferred from CIR this afternoon with bilateral LE critical limb ischemia, now S/P vascular procedure. Pharmacy is consulted to dose enoxaparin for VTE treatment (pt was receiving enoxaparin for this indication at CIR). Per Dr Virl Cagey, start enoxaparin tomorrow AM, 12/12/21.  H/H 10.9/32.2, plt 73 (primary team and oncology are aware of thrombocytopenia, felt to be due to chemotherapy); Scr 1.53 (increased  from BL of ~0.7-0.9), TBW CrCl ~62 mL/min  Goal of Therapy:  LMWH anti-Xa level 0.6-1.0 international units/ml  Monitor platelets by anticoagulation protocol: Yes   Plan:  Lovenox 1 mg/kg (85 mg) SQ Q 12 hours starting tomorrow morning (12/12/21) Monitor 4-hr LMWH anti-Xa level after 3-4 doses, given rising Scr and likely ongoing therapy Monitor CBC, renal function Monitor for bleeding  Gillermina Hu, PharmD, BCPS, [facility] Clinical Pharmacist 12/11/2021,4:53 PM

## 2021-12-11 NOTE — Progress Notes (Signed)
Speech Language Pathology Note  Patient Details  Name: Christopher Randall MRN: [id number] Date of Birth: 1961-01-26 Today's Date: 12/11/2021  Speech Therapy Discharge Note  This patient was unable to complete the inpatient rehab program due to unplanned procedure requiring discharge from CIR; therefore did not meet their long term goals. Pt left the program at a min-to-mod A assist level for their cognition, which was known to fluctuate based on level of alertness. This patient is being discharged from SLP services at this time.  See CareTool for functional status details.  If the patient is able to return to inpatient rehabilitation within 3 midnights, this may be considered an interrupted stay and therapy services will resume as ordered. Modification and reinstatement of their goals will be made upon completion of therapy service reevaluations.    Patty Sermons 12/11/2021, 3:33 PM

## 2021-12-11 NOTE — Progress Notes (Signed)
Pt admitted to [facility].  Pt is A&O X2, self and place only.  Pt placed on telemetry and CCMD notified.  Vitals taken and within range with exception of HR: 115.  CHG bath completed. Pt currently on bed rest.  Right groin site is a level 1 with some mild bruising and no signs of hematoma.   Pt is currently comfortable and not in pain.

## 2021-12-12 ENCOUNTER — Inpatient Hospital Stay (HOSPITAL_COMMUNITY): Payer: BC Managed Care – PPO

## 2021-12-12 ENCOUNTER — Inpatient Hospital Stay: Payer: BC Managed Care – PPO

## 2021-12-12 DIAGNOSIS — Z66 Do not resuscitate: Secondary | ICD-10-CM

## 2021-12-12 DIAGNOSIS — Z7189 Other specified counseling: Secondary | ICD-10-CM

## 2021-12-12 DIAGNOSIS — Z515 Encounter for palliative care: Secondary | ICD-10-CM

## 2021-12-12 DIAGNOSIS — C3490 Malignant neoplasm of unspecified part of unspecified bronchus or lung: Secondary | ICD-10-CM

## 2021-12-12 DIAGNOSIS — I6340 Cerebral infarction due to embolism of unspecified cerebral artery: Secondary | ICD-10-CM

## 2021-12-12 LAB — URINE CULTURE: Culture: NO GROWTH

## 2021-12-12 IMAGING — DX DG CHEST 1V PORT
1 series · 1 of 1 positions shown · non-contrast
Comparison: [DATE]

CLINICAL DATA: RIGHT-sided thoracocentesis

EXAM:
PORTABLE CHEST 1 VIEW

[chest ap]
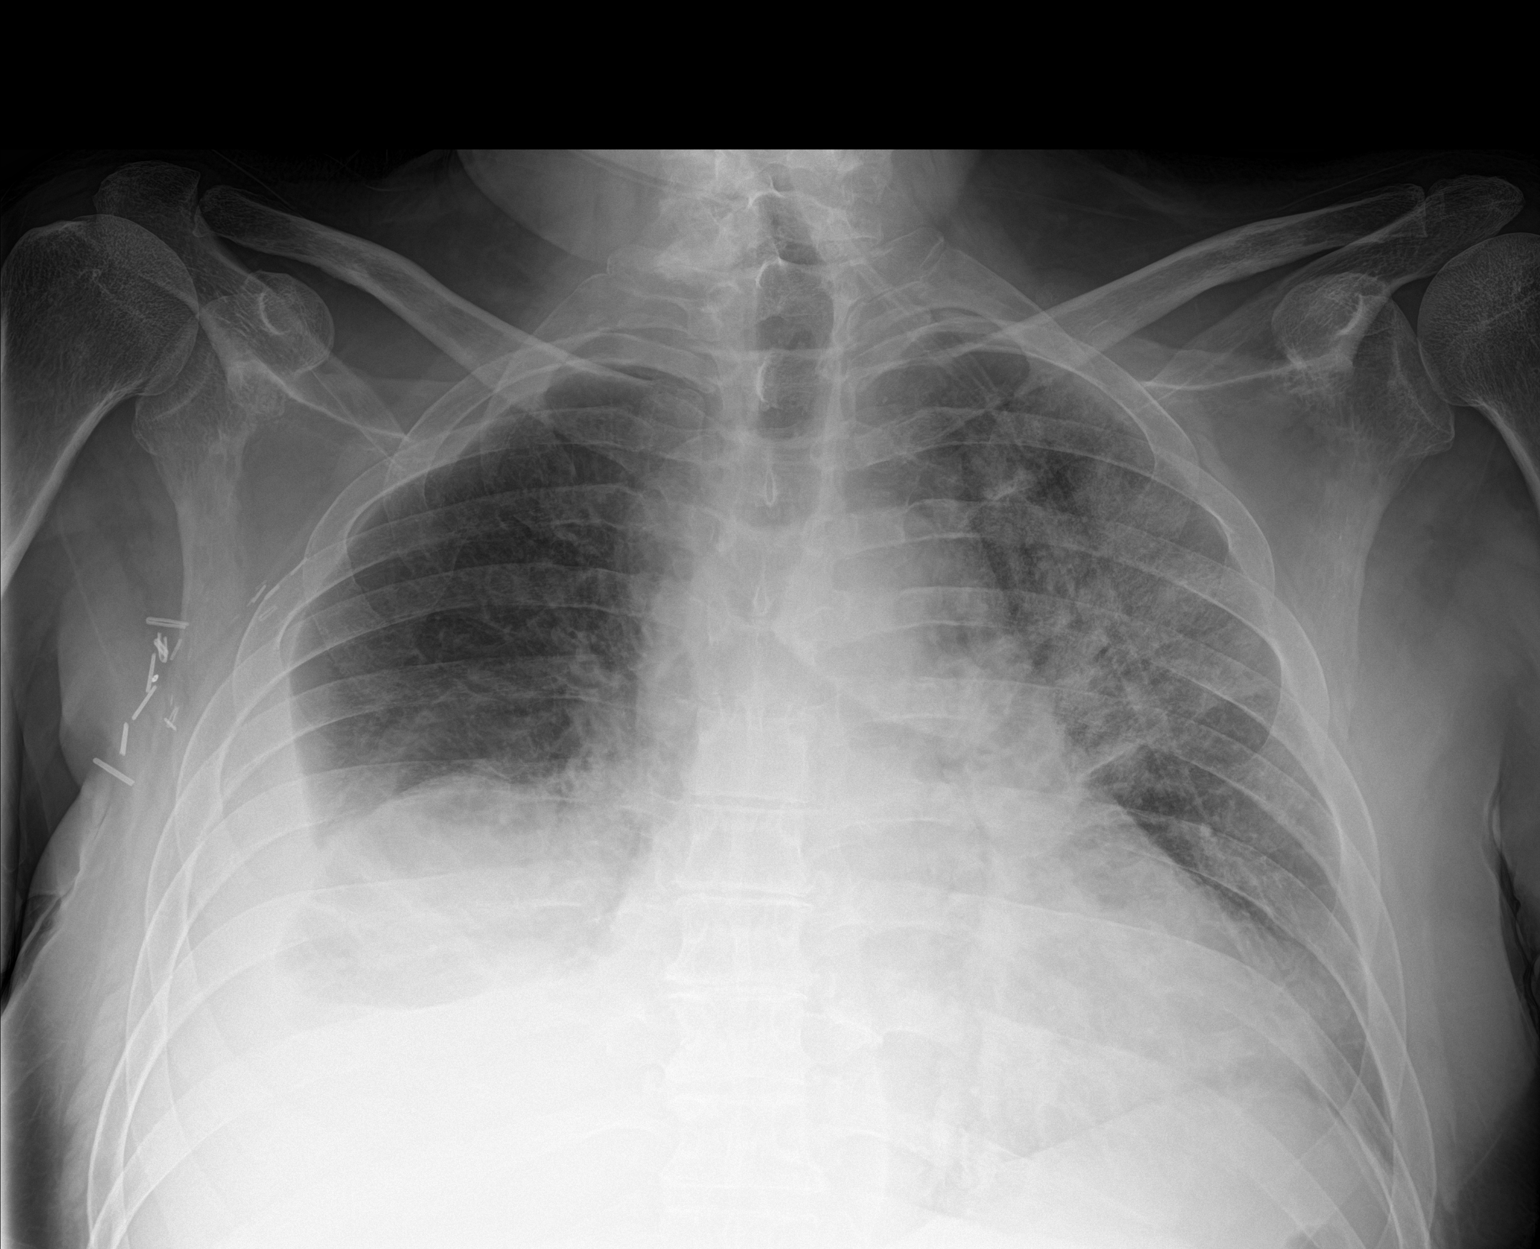

[1 of 1 positions shown; findings below may reference images not displayed]

FINDINGS: The cardiomediastinal silhouette is unchanged and enlarged in
contour.RIGHT axillary clips. Decreased RIGHT pleural effusion with
a moderate residual pleural effusion. No pneumothorax. LEFT-sided
reticular opacities, similar comparison to prior. Improved aeration
of the RIGHT lower lobe with persistent scattered areas of
homogeneous opacification most consistent with atelectasis.
Visualized abdomen is unremarkable.
IMPRESSION: Decreased RIGHT pleural effusion with moderate residual RIGHT-sided
pleural effusion.

## 2021-12-12 MED ORDER — GLYCOPYRROLATE 1 MG PO TABS
1.0000 mg | ORAL_TABLET | ORAL | 0 refills | Status: AC | PRN
Start: 2021-12-12 — End: ?

## 2021-12-12 MED ORDER — CEPHALEXIN 500 MG PO CAPS: 500.0000 mg | ORAL_CAPSULE | Freq: Three times a day (TID) | ORAL | 0 refills | Status: AC

## 2021-12-12 MED ORDER — HYDROMORPHONE HCL 1 MG/ML IJ SOLN
1.0000 mg | INTRAMUSCULAR | Status: DC | PRN
Start: 2021-12-12 — End: 2021-12-12

## 2021-12-12 MED ORDER — FENTANYL CITRATE PF 50 MCG/ML IJ SOSY
50.0000 ug | PREFILLED_SYRINGE | INTRAMUSCULAR | Status: DC | PRN
  Administered 2021-12-12: 14:00:00 50 ug via INTRAVENOUS
  Filled 2021-12-12: qty 1

## 2021-12-12 MED ORDER — LORAZEPAM 1 MG PO TABS: 1.0000 mg | ORAL_TABLET | ORAL | 0 refills | Status: AC | PRN

## 2021-12-12 MED ORDER — MORPHINE SULFATE 20 MG/5ML PO SOLN: 5.0000 mg | Freq: Two times a day (BID) | ORAL | 0 refills | Status: DC | PRN

## 2021-12-12 MED ORDER — LORAZEPAM 2 MG/ML IJ SOLN: 1.0000 mg | INTRAMUSCULAR | Status: DC | PRN

## 2021-12-12 MED ORDER — MORPHINE SULFATE 20 MG/5ML PO SOLN: 5.0000 mg | ORAL | 0 refills | Status: DC | PRN

## 2021-12-12 MED ORDER — POLYVINYL ALCOHOL 1.4 % OP SOLN
1.0000 [drp] | Freq: Four times a day (QID) | OPHTHALMIC | Status: DC | PRN
  Filled 2021-12-12: qty 15

## 2021-12-12 MED ORDER — GLYCOPYRROLATE 0.2 MG/ML IJ SOLN: 0.2000 mg | INTRAMUSCULAR | Status: DC | PRN

## 2021-12-12 MED ORDER — LIDOCAINE-EPINEPHRINE 1 %-1:100000 IJ SOLN
INTRAMUSCULAR | Status: AC
  Filled 2021-12-12: qty 1

## 2021-12-12 MED ORDER — HYDROMORPHONE HCL 1 MG/ML IJ SOLN: 1.0000 mg | INTRAMUSCULAR | Status: DC | PRN

## 2021-12-12 MED ORDER — HALOPERIDOL 0.5 MG PO TABS: 0.5000 mg | ORAL_TABLET | ORAL | 0 refills | Status: AC | PRN

## 2021-12-12 MED ORDER — ATROPINE ORAL SOLUTION 0.08 MG/ML: 0.5000 mg | ORAL | 0 refills | Status: AC | PRN

## 2021-12-12 MED ORDER — GLYCOPYRROLATE 1 MG PO TABS: 1.0000 mg | ORAL_TABLET | ORAL | Status: DC | PRN

## 2021-12-12 MED ORDER — HALOPERIDOL LACTATE 5 MG/ML IJ SOLN: 0.5000 mg | INTRAMUSCULAR | Status: DC | PRN

## 2021-12-12 MED ORDER — ONDANSETRON HCL 4 MG PO TABS: 4.0000 mg | ORAL_TABLET | Freq: Four times a day (QID) | ORAL | 0 refills | Status: AC | PRN

## 2021-12-12 MED ORDER — HALOPERIDOL LACTATE 2 MG/ML PO CONC: 0.5000 mg | ORAL | Status: DC | PRN

## 2021-12-12 MED ORDER — MORPHINE SULFATE (CONCENTRATE) 10 MG /0.5 ML PO SOLN: 5.0000 mg | ORAL | 0 refills | Status: AC | PRN

## 2021-12-12 MED ORDER — HALOPERIDOL 0.5 MG PO TABS: 0.5000 mg | ORAL_TABLET | ORAL | Status: DC | PRN

## 2021-12-12 MED ORDER — BIOTENE DRY MOUTH MT LIQD: 15.0000 mL | OROMUCOSAL | Status: DC | PRN

## 2021-12-12 NOTE — Progress Notes (Addendum)
Received referral to assist pt with hospice. Pt's wife wants pt to go home with hospice. Met with pt and family. Wife reports that she doesn't have a preference for a hospice agency. She reports that she heard good things about palliative care. [place], spoke with Maudie Mercury, she reports that she will contact the on call nurse.  09:55 - Addendum: Received call from [facility] with [place]. She will contact pt's wife and discuss their services. Will continue to f/u to assist with the D/C plan.

## 2021-12-12 NOTE — Progress Notes (Signed)
Pt has been trying to urinate but could not, 350 cc was emptied last night at 2300, pt also got dose of lasix last night. Bladder scan showed 360 cc. Oncoming Rn aware. Tacey Ruiz NP with palliative care was by bedside. Asked if she could order I/o or foley for retention, she said she will take care of it. Pt anxious, wife by bedside, see mar for medication administration. Will continue to monitor.

## 2021-12-12 NOTE — Progress Notes (Addendum)
[place] [facility]) Hospital Liaison Note  Received request from Transitions of Care Manager Norina Buzzard, RN, for hospice services at home after discharge. Chart and patient information reviewed by [facility] physician. Hospice eligibility confirmed.  Spoke with wife Sharyn Lull to initiate education related to hospice philosophy, services and team approach to care. Patient/family verbalized understanding of information provided. Per discussion, the plan for discharge is once determined if thoracentesis will be performed and once DME has been delivered to the home.  DME needs discussed. Patient has the following equipment in the home: shower chair. Patient/family requests the following equipment for delivery: O2, hospital bed with 1/2 rails, OBT, BSC, w/c, rolling walker. Address has been verified and is correct in the chart. Sharyn Lull is the family contact to arrange time of equipment delivery.   65 DME is currently being delivered.   Please send signed and completed DNR home with patient/family. Please provide prescriptions at discharge as needed to ensure ongoing symptom management.   [facility] information and contact numbers given to family. Above information shared with [place].   Please do not hesitate to call with any hospice related questions or concerns.   Thank you for the opportunity to participate in this patient's care.   Nadene Rubins, RN, BSN [facility] Liaison [phone]

## 2021-12-12 NOTE — Discharge Summary (Addendum)
Physician Discharge Summary  Patient ID: Christopher Randall MRN: [id number] DOB/AGE: [birth year] 59 y.o.  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 Peripheral vascular disease.  Discharged Condition: poor  Hospital Course:  Christopher Randall is a 60 y.o. 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  Multiple brain emboli due to hypercoagulable status, severe bilateral lower extremity arterial disease need amputation. Patient is minimally responsive, he has been seen by palliative care and hospice.  He is actively dying.  At this point, family want take patient home with hospice.  Per patient family request, will obtain ultrasound thoracentesis before discharge.  Please note: The morphine prescription was initially sent to [facility], which I was notified does not carry the medicine. A script was [facility] the current [facility], but they do not carry the concentration (I have called the pharmacy to confirm). As a result, a third prescription was sent.  Consults: palliative   Significant Diagnostic Studies:   Treatments: comfort care, thoracentesis  Discharge Exam: Blood pressure (!) 150/90, pulse (!) 106, temperature 97.6 F (36.4 C), temperature source  Axillary, resp. rate 20, SpO2 97 %. General appearance: Minimally responsive Resp: Decreased breathing sounds Cardio: regular rate and rhythm, S1, S2 normal, no murmur, click, rub or gallop GI: soft, non-tender; bowel sounds normal; no masses,  no organomegaly Extremities: extremities normal, atraumatic, no cyanosis or edema  Disposition: Discharge disposition: 50-Hospice/Home       Discharge Instructions     Diet general   Complete by: As directed    As tolerated   Discharge wound care:   Complete by: As directed    Follow by hospice   Increase activity slowly   Complete by: As directed       Allergies as of 12/12/2021       Reactions   Iopamidol Hives, Itching   Isovue break through with prep, Hives and Itching Patient had PET?CT scan with 125 mls of Isovue-300. Noted to have two raised itchy hives which self-resolved. Patient had PET/CT scan with contrast on 05/27/2014 while on prednisone prep (3 doses of 50 mg-13 hour prep) and noted to have two hives which self -resolved without treatment   Iodinated Contrast Media Rash   Iodine-131 Rash        Medication List     STOP taking these medications    atorvastatin 40 MG tablet Commonly known as: LIPITOR   clonazePAM 0.5 MG tablet Commonly known as: KLONOPIN   clopidogrel 75 MG tablet Commonly known as: PLAVIX   dexamethasone 1 MG tablet Commonly known as: DECADRON   folic acid 1 MG tablet Commonly known as: FOLVITE   metoprolol tartrate 25 MG tablet Commonly known as: LOPRESSOR   vitamin B-12 1000 MCG tablet Commonly known as: CYANOCOBALAMIN       TAKE these medications  acetaminophen 325 MG tablet Commonly known as: TYLENOL Take 1-2 tablets (325-650 mg total) by mouth every 4 (four) hours as needed for mild pain.   atropine 0.08 mg/mL Soln Take 6.3 mLs (0.5 mg total) by mouth every 3 (three) hours as needed.   cefadroxil 500 MG capsule Commonly known as: DURICEF Take 2 capsules (1,000  mg total) by mouth 2 (two) times daily.   fluconazole 100 MG tablet Commonly known as: DIFLUCAN Take 2 tablets today, then 1 tablet daily x 20 more days. What changed:  how much to take how to take this when to take this additional instructions   furosemide 40 MG tablet Commonly known as: LASIX Take 1 tablet (40 mg total) by mouth daily.   glycopyrrolate 1 MG tablet Commonly known as: ROBINUL Take 1 tablet (1 mg total) by mouth every 4 (four) hours as needed (excessive secretions).   guaiFENesin 600 MG 12 hr tablet Commonly known as: MUCINEX Take 2 tablets (1,200 mg total) by mouth 2 (two) times daily.   haloperidol 0.5 MG tablet Commonly known as: HALDOL Take 1 tablet (0.5 mg total) by mouth every 4 (four) hours as needed for agitation (or delirium).   HYDROcodone-acetaminophen 5-325 MG tablet Commonly known as: NORCO/VICODIN Take 1-2 tablets by mouth every 4 (four) hours as needed for moderate pain.   LORazepam 1 MG tablet Commonly known as: ATIVAN Take 1 tablet (1 mg total) by mouth every 4 (four) hours as needed for anxiety.   magic mouthwash (lidocaine, diphenhydrAMINE, alum & mag hydroxide) suspension Swish and spit 5 mls by mouth every 4-6 hours as needed What changed:  how much to take how to take this when to take this reasons to take this   megestrol 40 MG tablet Commonly known as: MEGACE Take 4 tablets (160 mg total) by mouth daily.   morphine 20 MG/5ML solution Take 1.3 mLs (5.2 mg total) by mouth every 12 (twelve) hours as needed for pain.   nicotine polacrilex 4 MG lozenge Commonly known as: COMMIT Take 4 mg by mouth 2 (two) times daily as needed for smoking cessation.   ondansetron 4 MG tablet Commonly known as: ZOFRAN Take 1 tablet (4 mg total) by mouth every 6 (six) hours as needed for nausea.   polyethylene glycol 17 g packet Commonly known as: MIRALAX / GLYCOLAX Take 17 g by mouth daily as needed for moderate constipation.    prochlorperazine 10 MG tablet Commonly known as: COMPAZINE Take 1 tablet (10 mg total) by mouth every 6 (six) hours as needed (Nausea or vomiting). What changed: reasons to take this   silver sulfADIAZINE 1 % cream Commonly known as: SILVADENE Apply topically 2 (two) times daily.   sodium chloride 0.65 % Soln nasal spray Commonly known as: OCEAN Place 1 spray into both nostrils daily as needed for congestion.   Visine 0.025-0.3 % ophthalmic solution Generic drug: naphazoline-pheniramine 1 drop 4 (four) times daily as needed for eye irritation.               Discharge Care Instructions  (From admission, onward)           Start     Ordered   12/12/21 0000  Discharge wound care:       Comments: Follow by hospice   12/12/21 1442            More than 35 minutes spent on coordination patient discharge. Signed: Sharen Hones 12/12/2021, 2:43 PM

## 2021-12-12 NOTE — Progress Notes (Signed)
Pt discharged to home hospice with family via [place] service.  AVS and signed DNR form sent with PTAR.  Pt not on telemetry.  Pt's IV's removed.  Wound care education demonstrated to family caretaker.  All pharmacy/medication concerns communicated to MD and modified appropriately. All discharge questions and concerns addressed.

## 2021-12-12 NOTE — Progress Notes (Signed)
°   12/12/21 0800  Clinical Encounter Type  Visited With Patient and family together  Visit Type Spiritual support;Social support;Initial  Referral From Nurse  Consult/Referral To Chaplain   Chaplain visited with pt and wife.  Paged by nurse as pt has "acute decompensation and is now dying."  Pt and wife married 8 years known each other 17 years.  Pt prayed with pt and wife.  Made the decision this morning to go on comfort care.  Provided support.  Other family may arrive according to nurse and Chaplain is available for other family.

## 2021-12-12 NOTE — Procedures (Signed)
Pre procedural Dx: Symptomatic pleural effusion Post procedural Dx: Same  Successful US guided right sided thoracentesis yielding 2.4 L of serous pleural fluid.    EBL: None Complications: None immediate.  Ronny Bacon, MD Pager #: [phone]

## 2021-12-12 NOTE — Progress Notes (Signed)
Palliative Medicine Inpatient Follow Up Note  It was requested that I come to bedside in the late afternoon to discuss with Jeydan's family his current clinical condition.  I met with Jais's 4 children and daughter-in-law at bedside.  We reviewed the typical reasons why patients do not receive intravenous fluids and antibiotics as they encroach upon end-of-life.  I shared that IV fluids can be problematic as organ systems shutdown in patients have fluid buildup in extravascular spaces.  We reviewed that antibiotics are not an inappropriate consideration if we believe what ever infectious process that is affecting the patient is causing them increased discomfort or suffering.  I shared that often antibiotics can prolong the inevitable and leave patients in a state of flux - because of that typically these are not continued.  A review of Trenden's hospital course occurred.  His family share the disbelief at how quickly his decline has occurred in the past 6 weeks.  Patient's sons and daughters are very disheartened by the level of medical care that has been provided.  The family as a whole do not feel like their concerns have been heard nor do they feel that Bhavin has had the best medical care.  They are very upset that conversations about end-of-life are just occurring now as opposed to having occurred many weeks ago when Kham could have been home enjoying his final journey on earth with his family.  I listened to their concerns, frustrations, anger with an empathetic year.  I apologized for the health system failing them in the many ways that they listed.  We reviewed that a thoracentesis could certainly alleviate some of Kahari symptoms.  Family is proceeding with the thought of this cautiously and are interested in getting an ultrasound and speaking to radiology prior to deciding on whether or not to pursue this.  We reviewed that where we are now is a very different space and that Cordaro appears to be  encroaching fairly quickly.  Patient's son(s), daughters, and spouse expressed frustration again in the setting of Keean's declining health state and what they feel is very poor medical care.  We reviewed that at this time the goal is a family would be to get [place] home.  I shared that I will try the best I can to expedite that process through coordination with case management and outpatient hospice.  Allowed a safe space for patient's family to express questions, concerns, and fears. _________________________________________ Addendum:  I spoke to [place] who shares that she is having a difficult time getting a hold of the DME company.  We reviewed together the importance of Ranard getting home today which she is very much aware of.  Per correspondence with Jolayne Haines - DME will be delivered between 4 and 5 this evening.  Unfortunately Jolayne Haines is unable to control ambulance transport with [facility] in terms of timing though nursing and I have discussed communicating the importance of a timely discharge.  SUMMARY OF RECOMMENDATIONS   DNAR/DNI   Goals are for comfort   Comfort care initiated - Comfort medications per [facility] --> patient's family member had a fairly severe reaction to Dilaudid in the past.  Tadeusz does not appear to ever have Dilaudid though has had fentanyl which he has tolerated in the past therefore for air hunger and pain Fentanyl will be the drug of choice   Unrestricted visitation   Chaplain consult   Working hand-in-hand with [facility] hospice in an effort to get [place] home this  evening  Dr. Roosevelt Locks to send prescriptions to patient's home pharmacy  Time Spent: 65 Greater than 50% of the time was spent in counseling and coordination of care ______________________________________________________________________________________ [place] Team Team Cell Phone: [phone] Please utilize secure chat with additional questions,  if there is no response within 30 minutes please call the above phone number  Palliative Medicine Team providers are available by phone from 7am to 7pm daily and can be reached through the team cell phone.  Should this patient require assistance outside of these hours, please call the patient's attending physician.

## 2021-12-12 NOTE — Progress Notes (Signed)
RT NOTE:   BIPAP started for patient. Duoneb given through BIPAP. Pt was only able to tolerate x 10 minutes. Pt pulled mask off and stated he could not tolerate it.

## 2021-12-12 NOTE — Progress Notes (Signed)
Notified DR. Alcario Drought that pt has been on bi-pap for 5 min and already pulled it twice, will give him Ativan and try again. If pt can tolerate leave off bi-pap  per MD. Will continue to monitor.

## 2021-12-12 NOTE — Plan of Care (Signed)

## 2021-12-12 NOTE — Progress Notes (Addendum)
Palliative Medicine Inpatient Follow Up Note  Consulting Provider: Charlett Blake, MD   Reason for consult:   Christopher Randall Palliative Medicine Consult  Reason for Consult? goals of care    HPI:  Per intake H&P -->  Christopher Randall is a 60 year old male with history of melanoma, anxiety/depression, diagnosis of NSCLC with mets to the brain10/21/22, reports of hallucinations w/headaches since thanksgiving and recent initiation of chemo/immunotherapy/XRT. He was unable to proceed with full dose chemo due to thrombocytopenia.  Patient identified to have metastatic processes on his MR brain.  He was noted to have multiple small infarctions as well..  Identified to have lower extremity bilateral thromboses as well as cellulitis for which vascular surgery was involved.   Palliative care was consulted in the setting of acute on chronic disease processes with progressive non-small cell lung cancer to further address goals of care.  Today's Discussion (12/12/2021):  *Please note that this is a verbal dictation therefore any spelling or grammatical errors are due to the "Daisy One" system interpretation.  Chart reviewed.  Patient went into acute respiratory failure in the setting of a large right pleural effusion.  I spoke to patient's spouse, Christopher Randall we reviewed that the medical team has provided all avenues of care for Christopher Randall inclusive of palliative radiation, acute rehabilitation, angiogram yesterday of lower extremities whereby vascular surgery offered bilateral above-the-knee amputations.  Christopher Randall is very tearful and states that "I do not know what we can do at this point".  I shared with her that it appears Christopher Randall's body is no longer able to compensate in the setting of severity of malignant disease.  We reviewed that when Christopher Randall was able he did share that he wanted to have everything done.  I recommended that we meet him at bedside and have a very poignant  conversation stating the fact so that he may give [place] an indication as to what he would want in the future.  Christopher Randall has received 2 doses of Ativan in an effort to provide BiPAP which he has been fighting throughout the night.  I stayed at bedside with Christopher Randall for quite some time explaining to Christopher Randall his acute conditions in the setting of his non-small cell lung cancer.  I shared the worry that he is going to be on earth for a short period of time and its important we determine what he would like that time to look like.  We reviewed either continuing the current treatment path which unfortunately may result in additional procedures additional monitoring and needlesticks.  These are interventions that he has been fighting as of recently.  We discussed that an alternative though that is allowing him to be comfortable and given low doses of medications to help his symptoms with the goal in mind of getting him home for his final journey in life.  After much discussion Christopher Randall was in agreement with a comfort oriented path.  I shared that with that path if his heart were to stop her breathing were to be impaired further we would not provide cardiopulmonary resuscitation as there would unfortunately at that point be no true benefit in the setting of his underlying disease.  Christopher Randall and Christopher Randall are in agreement.  Christopher Randall requested some coffee which nursing was able to provide him with.  Christopher Randall shares that he would like to be in his home.  I have reached out to the case management team to initiate a hospice consultation.  Patient's wife will  be updated by case Freight forwarder.  A chaplain has been ordered in the setting of the acuity of patient's illnesses.  Patient's family has been called to come to bedside.  Primary team informed of this change in patient care.  Went back to bedside to provide patient's spouse, Christopher Randall additional emotional support.  Ongoing palliative care support at this time.  Questions and concerns  answered.  Objective Assessment: Vital Signs Vitals:   12/12/21 0325 12/12/21 0837  BP: (!) 147/89 (!) 150/90  Pulse: (!) 116 (!) 106  Resp: 20 20  Temp: 97.6 F (36.4 C) 97.6 F (36.4 C)  SpO2: 96% 97%    Intake/Output Summary (Last 24 hours) at 12/12/2021 3833 Last data filed at 12/11/2021 2309 Gross per 24 hour  Intake 180 ml  Output 350 ml  Net -170 ml   Gen: Middle-age Caucasian male in distress HEENT: moist mucous membranes CV: Irregular rate and rhythm PULM: On 5L nasal cannula, laborious breathing pattern with use of accessory muscles ABD: soft/nontender EXT: Bilateral lower extremities have wrappings on them to the level of the calf Neuro: Alert and oriented x2-3  SUMMARY OF RECOMMENDATIONS   DNAR/DNI  Goals are for comfort  Comfort care initiated -comfort medications per [facility]  Unrestricted visitation  Chaplain consult  Ideally patient would like to get home with hospice  Ongoing palliative care support  Time Spent: 72 Greater than 50% of the time was spent in counseling and coordination of care ______________________________________________________________________________________ [place] Team Team Cell Phone: [phone] Please utilize secure chat with additional questions, if there is no response within 30 minutes please call the above phone number  Palliative Medicine Team providers are available by phone from 7am to 7pm daily and can be reached through the team cell phone.  Should this patient require assistance outside of these hours, please call the patient's attending physician.

## 2021-12-12 NOTE — Progress Notes (Addendum)
Contacted [facility] for transportation. Verified pt's address with pt's daughter. Asked [facility] to prioritize the ambulance due to pt's condition, he reports that he will add a note, but he is unable to provide the time the ambulance will be here.

## 2021-12-13 ENCOUNTER — Other Ambulatory Visit: Payer: Self-pay | Admitting: Hematology

## 2021-12-15 ENCOUNTER — Ambulatory Visit: Payer: BC Managed Care – PPO

## 2021-12-15 ENCOUNTER — Encounter: Payer: Self-pay | Admitting: Hematology

## 2021-12-15 ENCOUNTER — Telehealth: Payer: Self-pay

## 2021-12-15 MED FILL — Lidocaine Inj 1% w/ Epinephrine-1:100000: INTRAMUSCULAR | Qty: 20 | Status: AC

## 2021-12-16 ENCOUNTER — Ambulatory Visit: Payer: BC Managed Care – PPO

## 2021-12-16 ENCOUNTER — Ambulatory Visit: Payer: BC Managed Care – PPO | Admitting: Internal Medicine

## 2021-12-16 ENCOUNTER — Telehealth: Payer: Self-pay | Admitting: Physician Assistant

## 2021-12-17 ENCOUNTER — Telehealth: Payer: Self-pay

## 2021-12-17 ENCOUNTER — Ambulatory Visit: Payer: BC Managed Care – PPO

## 2021-12-17 NOTE — Telephone Encounter (Signed)
Patient was scheduled for appointment with Dr. Linus Salmons on 12/16/21. Received call from patient's wife stating patient has been deceased since Dec 19, 2023 PM. Staff condolences were express to wife. Staff will route message to PCP to also make aware. Eugenia Mcalpine

## 2021-12-18 ENCOUNTER — Ambulatory Visit: Payer: BC Managed Care – PPO

## 2021-12-20 NOTE — Progress Notes (Signed)
Inpatient Rehabilitation Care Coordinator Discharge Note   Patient Details  Name: Christopher Randall MRN: [id number] Date of Birth: 27-May-1961   Discharge location: home w/ hopsice  Length of Stay:    Discharge activity level: min/mod  Home/community participation: hospice & family  Patient response to:   Patient response MB:OBOFPU Isolation - How often do you feel lonely or isolated from those around you?: Patient unable to respond  Services provided included: SW, Pharmacy, TR, CM, RN, SLP, OT, PT, RD, MD  Financial Services:  Financial Services Utilized: [place] offered to/list presented to: family  Follow-up services arranged:              Patient response to transportation need: Is the patient able to respond to transportation needs?: No In the past 12 months, has lack of transportation kept you from medical appointments or from getting medications?: No In the past 12 months, has lack of transportation kept you from meetings, work, or from getting things needed for daily living?: No    Comments (or additional information):  Patient/Family verbalized understanding of follow-up arrangements:     Individual responsible for coordination of the follow-up plan:    Confirmed correct DME delivered: Dyanne Iha 12/25/2021    Dyanne Iha

## 2021-12-20 NOTE — Telephone Encounter (Signed)
Spoke with patient's wife Sharyn Lull about patient being discharged home with Hospice, and desire to cancel remaining radiation appointments. Encouraged wife to reach out to patient's oncology team if she or patient had any other concerns or needs we could help with. Wife verbalized understanding but denied any additional assistance at this time.   Relayed update to therapists and physician covering Dr. Isidore Moos this week

## 2021-12-20 NOTE — Telephone Encounter (Signed)
Would like to know if [place] will be the attending provider for Hospice.

## 2021-12-20 DEATH — deceased

## 2021-12-22 ENCOUNTER — Ambulatory Visit: Payer: BC Managed Care – PPO

## 2021-12-22 ENCOUNTER — Ambulatory Visit: Payer: BC Managed Care – PPO | Admitting: Physician Assistant

## 2021-12-22 NOTE — Telephone Encounter (Signed)
Condolences expressed to patient's wife today from me and our staff.

## 2021-12-22 NOTE — Telephone Encounter (Signed)
Noted. Will inform office staff here and I will personally call patient's wife to express condolences.

## 2021-12-23 ENCOUNTER — Ambulatory Visit: Payer: BC Managed Care – PPO

## 2022-01-06 ENCOUNTER — Encounter: Payer: Self-pay | Admitting: Hematology

## 2022-01-13 ENCOUNTER — Ambulatory Visit: Payer: BC Managed Care – PPO | Admitting: Cardiovascular Disease

## 2022-01-13 NOTE — Progress Notes (Deleted)
Cardiology Office Note:   Date:  01/13/2022  NAME:  Christopher Randall    MRN: [id number] DOB:  Mar 17, 1961   PCP:  Fredirick Lathe, PA-C  Cardiologist:  None  Electrophysiologist:  None   Referring MD: Fredirick Lathe, PA-C   No chief complaint on file. ***  History of Present Illness:   Christopher Randall is a 61 y.o. male with a hx of *** who is being seen today for the evaluation of *** at the request of ***.  Problem List Stage IV NSCLC -w/ brain mets 2. STEMI -11/2021 -no candidate for ICA due to severe thrombocytopenia  3. DVT -11/28/2021 -B/L gastrocnemius veins  4. 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 Arterial Thrombus -aortogram/run off 12/11/2021 -L foot thrombus in tibial vessels  Past Medical History: Past Medical History:  Diagnosis Date   Abnormal TSH 09/28/2017   Anemia 11/27/2021   Anxiety 07/03/2012   Depression    Difficulty sleeping 09/28/2017   Examination of participant in clinical trial 09/11/2012   History of immunotherapy 02/23/2016   Melanoma of skin 10/26/2011   Nicotine dependence 07/17/2015   Smoker    STEMI (ST elevation myocardial infarction) ([place]) 11/27/2021    Past Surgical History: Past Surgical History:  Procedure Laterality Date   ABDOMINAL AORTOGRAM W/LOWER EXTREMITY N/A 12/11/2021   Procedure: ABDOMINAL AORTOGRAM W/LOWER EXTREMITY;  Surgeon: Broadus John, MD;  Location: [facility];  Service: Cardiovascular;  Laterality: N/A;   BRONCHIAL BIOPSY  10/09/2021   Procedure: BRONCHIAL BIOPSIES;  Surgeon: [name], MD;  Location: [facility];  Service: Endoscopy;;   BRONCHIAL BRUSHINGS  10/09/2021   Procedure: BRONCHIAL BRUSHINGS;  Surgeon: [name], MD;  Location: [facility];  Service: Endoscopy;;   ENDOBRONCHIAL ULTRASOUND N/A 10/09/2021   Procedure: ENDOBRONCHIAL ULTRASOUND;  Surgeon: [name], MD;  Location: [facility];  Service: Endoscopy;  Laterality: N/A;   FINE NEEDLE ASPIRATION   10/09/2021   Procedure: FINE NEEDLE ASPIRATION (FNA) LINEAR;  Surgeon: [name], MD;  Location: [facility];  Service: Endoscopy;;   LEFT POPLITEAL MASS RESECTION  08/27/2011   pigmented villonodular synovitis, negative for malignancy   LYMPH GLAND EXCISION  07/12/2011   right axilla, consistent with melanoma   LYMPH NODE DISSECTION  08/27/2011   right, 0/17 nodes positive   PERIPHERAL VASCULAR BALLOON ANGIOPLASTY Left 12/11/2021   Procedure: PERIPHERAL VASCULAR BALLOON ANGIOPLASTY;  Surgeon: Broadus John, MD;  Location: [facility];  Service: Cardiovascular;  Laterality: Left;   VIDEO BRONCHOSCOPY N/A 10/09/2021   Procedure: VIDEO BRONCHOSCOPY WITHOUT FLUORO;  Surgeon: [name], MD;  Location: [facility];  Service: Endoscopy;  Laterality: N/A;    Current Medications: No outpatient medications have been marked as taking for the 01/13/22 encounter (Appointment) with O'Neal, Cassie Freer, MD.     Allergies:    Iopamidol, Iodinated contrast media, and Iodine-131   Social History: Social History   Socioeconomic History   Marital status: Married    Spouse name: Not on file   Number of children: 3   Years of education: Not on file   Highest education level: Not on file  Occupational History    Employer: [employer]  Tobacco Use   Smoking status: Former    Packs/day: 0.25    Types: Cigarettes   Smokeless tobacco: Never  Vaping Use   Vaping Use: Never used  Substance and Sexual Activity   Alcohol use: Yes    Alcohol/week: 6.0 standard drinks  Types: 6 Shots of liquor per week   Drug use: No   Sexual activity: Not on file  Other Topics Concern   Not on file  Social History Narrative   Not on file   Social Determinants of Health   Financial Resource Strain: Not on file  Food Insecurity: No Food Insecurity   Worried About Running Out of Food in the Last Year: Never true   Ran Out of Food in the Last Year: Never true  Transportation Needs:  No Transportation Needs   Lack of Transportation (Medical): No   Lack of Transportation (Non-Medical): No  Physical Activity: Not on file  Stress: Not on file  Social Connections: Not on file     Family History: The patient's ***family history includes CAD in his mother; Hypertension in his mother; Lung cancer in his father.  ROS:   All other ROS reviewed and negative. Pertinent positives noted in the HPI.     EKGs/Labs/Other Studies Reviewed:   The following studies were personally reviewed by me today:  EKG:  EKG is *** ordered today.  The ekg ordered today demonstrates ***, and was personally reviewed by me.   TTE 11/28/2021  1. Limited echo: With definity Overall EF preserved distal septal apical  and inferior apical hypokinesis No mural apical thormbus seen.   TTE 11/28/2021  1. Patient to return for more images to assess RWMAls and apex with  definity . Left ventricular ejection fraction, by estimation, is 55%. The  left ventricle has normal function. The left ventricle has no regional  wall motion abnormalities. Left  ventricular diastolic parameters were normal.   2. Right ventricular systolic function is normal. The right ventricular  size is normal.   3. No evidence of tamponade . Small to moderate. The pericardial effusion  is posterior to the left ventricle and anterior to the right ventricle.  There is no evidence of cardiac tamponade.   4. The mitral valve is grossly normal. No evidence of mitral valve  regurgitation. No evidence of mitral stenosis.   5. The aortic valve is tricuspid. Aortic valve regurgitation is not  visualized. No aortic stenosis is present.   6. The inferior vena cava is normal in size with greater than 50%  respiratory variability, suggesting right atrial pressure of 3 mmHg.   7. Agitated saline contrast bubble study was negative, with no evidence  of any interatrial shunt.    Recent Labs: 11/27/2021: B Natriuretic Peptide 472.9; TSH  0.523 12/11/2021: ALT 32; BUN 55; Creatinine, Ser 1.53; Hemoglobin 10.9; Magnesium 2.3; Platelets 73; Potassium 4.1; Sodium 130   Recent Lipid Panel    Component Value Date/Time   CHOL 173 11/28/2021 0031   TRIG 172 (H) 11/28/2021 0031   HDL 20 (L) 11/28/2021 0031   CHOLHDL 8.7 11/28/2021 0031   VLDL 34 11/28/2021 0031   LDLCALC 119 (H) 11/28/2021 0031    Physical Exam:   VS:  There were no vitals taken for this visit.   Wt Readings from Last 3 Encounters:  12/10/21 187 lb 9.8 oz (85.1 kg)  12/04/21 200 lb 13.4 oz (91.1 kg)  11/27/21 203 lb 12.8 oz (92.4 kg)    General: Well nourished, well developed, in no acute distress Head: Atraumatic, normal size  Eyes: PEERLA, EOMI  Neck: Supple, no JVD Endocrine: No thryomegaly Cardiac: Normal S1, S2; RRR; no murmurs, rubs, or gallops Lungs: Clear to auscultation bilaterally, no wheezing, rhonchi or rales  Abd: Soft, nontender, no hepatomegaly  Ext:  No edema, pulses 2+ Musculoskeletal: No deformities, BUE and BLE strength normal and equal Skin: Warm and dry, no rashes   Neuro: Alert and oriented to person, place, time, and situation, CNII-XII grossly intact, no focal deficits  Psych: Normal mood and affect   ASSESSMENT:   Christopher Randall is a 61 y.o. male who presents for the following: No diagnosis found.  PLAN:   There are no diagnoses linked to this encounter.  {Are you ordering a CV Procedure (e.g. stress test, cath, DCCV, TEE, etc)?   Press F2        :[id number]}  Disposition: No follow-ups on file.  Medication Adjustments/Labs and Tests Ordered: Current medicines are reviewed at length with the patient today.  Concerns regarding medicines are outlined above.  No orders of the defined types were placed in this encounter.  No orders of the defined types were placed in this encounter.   There are no Patient Instructions on file for this visit.   Time Spent with Patient: I have spent a total of *** minutes with patient  reviewing hospital notes, telemetry, EKGs, labs and examining the patient as well as establishing an assessment and plan that was discussed with the patient.  > 50% of time was spent in direct patient care.  Signed, Addison Naegeli. Audie Box, MD, [place] [facility]  [address], [place], [place] [address] [phone]  01/13/2022 7:21 AM

## 2022-04-22 ENCOUNTER — Other Ambulatory Visit (HOSPITAL_COMMUNITY): Payer: Self-pay
